# Patient Record
Sex: Male | Born: 1950
Health system: Southern US, Community
[De-identification: ages and names within clinical notes are randomized; demographics above are authoritative.]

## PROBLEM LIST (undated history)

## (undated) DIAGNOSIS — I10 Essential (primary) hypertension: Secondary | ICD-10-CM

## (undated) DIAGNOSIS — M179 Osteoarthritis of knee, unspecified: Secondary | ICD-10-CM

## (undated) DIAGNOSIS — F419 Anxiety disorder, unspecified: Secondary | ICD-10-CM

## (undated) DIAGNOSIS — M1990 Unspecified osteoarthritis, unspecified site: Secondary | ICD-10-CM

## (undated) DIAGNOSIS — M1710 Unilateral primary osteoarthritis, unspecified knee: Secondary | ICD-10-CM

## (undated) DIAGNOSIS — R001 Bradycardia, unspecified: Secondary | ICD-10-CM

## (undated) DIAGNOSIS — E119 Type 2 diabetes mellitus without complications: Secondary | ICD-10-CM

## (undated) HISTORY — PX: HERNIA REPAIR: SHX51

## (undated) HISTORY — DX: Essential (primary) hypertension: I10

## (undated) HISTORY — DX: Type 2 diabetes mellitus without complications: E11.9

## (undated) HISTORY — DX: Unspecified osteoarthritis, unspecified site: M19.90

## (undated) HISTORY — PX: SPINE SURGERY: SHX786

---

## 2006-03-15 ENCOUNTER — Ambulatory Visit (HOSPITAL_COMMUNITY): Admission: RE | Admit: 2006-03-15 | Discharge: 2006-03-15 | Payer: Self-pay | Admitting: Neurosurgery

## 2006-04-03 ENCOUNTER — Inpatient Hospital Stay (HOSPITAL_COMMUNITY): Admission: RE | Admit: 2006-04-03 | Discharge: 2006-04-06 | Payer: Self-pay | Admitting: Neurosurgery

## 2006-11-05 ENCOUNTER — Ambulatory Visit (HOSPITAL_COMMUNITY): Admission: RE | Admit: 2006-11-05 | Discharge: 2006-11-05 | Payer: Self-pay | Admitting: Internal Medicine

## 2006-11-06 ENCOUNTER — Emergency Department (HOSPITAL_COMMUNITY): Admission: EM | Admit: 2006-11-06 | Discharge: 2006-11-06 | Payer: Self-pay | Admitting: Emergency Medicine

## 2006-11-11 ENCOUNTER — Ambulatory Visit: Payer: Self-pay | Admitting: Internal Medicine

## 2006-11-13 ENCOUNTER — Ambulatory Visit: Payer: Self-pay | Admitting: Internal Medicine

## 2006-11-27 ENCOUNTER — Ambulatory Visit (HOSPITAL_COMMUNITY): Admission: RE | Admit: 2006-11-27 | Discharge: 2006-11-27 | Payer: Self-pay | Admitting: Internal Medicine

## 2007-03-27 ENCOUNTER — Ambulatory Visit (HOSPITAL_BASED_OUTPATIENT_CLINIC_OR_DEPARTMENT_OTHER): Admission: RE | Admit: 2007-03-27 | Discharge: 2007-03-27 | Payer: Self-pay | Admitting: Plastic Surgery

## 2008-05-23 ENCOUNTER — Ambulatory Visit: Payer: Self-pay | Admitting: Internal Medicine

## 2008-06-27 ENCOUNTER — Ambulatory Visit: Payer: Self-pay | Admitting: Internal Medicine

## 2008-07-06 ENCOUNTER — Ambulatory Visit: Payer: Self-pay | Admitting: Internal Medicine

## 2008-11-14 ENCOUNTER — Ambulatory Visit: Payer: Self-pay | Admitting: Internal Medicine

## 2010-06-26 ENCOUNTER — Ambulatory Visit: Payer: Self-pay | Admitting: Internal Medicine

## 2010-07-03 ENCOUNTER — Ambulatory Visit: Payer: Self-pay | Admitting: Internal Medicine

## 2010-09-04 ENCOUNTER — Ambulatory Visit (INDEPENDENT_AMBULATORY_CARE_PROVIDER_SITE_OTHER): Payer: Commercial Managed Care - PPO | Admitting: Internal Medicine

## 2010-09-04 DIAGNOSIS — M1990 Unspecified osteoarthritis, unspecified site: Secondary | ICD-10-CM

## 2010-09-04 DIAGNOSIS — M2681 Anterior soft tissue impingement: Secondary | ICD-10-CM

## 2010-11-20 NOTE — Op Note (Signed)
NAME:  [name], [name] NO.:  [id number]   MEDICAL RECORD NO.:  [id number]          PATIENT TYPE:  AMB   LOCATION:  DSC                          FACILITY:  [facility]   PHYSICIAN:  Etter Sjogren, M.D.     DATE OF BIRTH:  12-02-1950   DATE OF PROCEDURE:  03/27/2007  DATE OF DISCHARGE:                               OPERATIVE REPORT   PREOPERATIVE DIAGNOSIS:  Keloid/hypertrophic scar, left neck, with  limitation of range of motion.   POSTOPERATIVE DIAGNOSIS:  Keloid/hypertrophic scar, left neck, with  limitation of range of motion.   PROCEDURE PERFORMED:  Multiple Z-plasties of scar, left neck.   SURGEON:  Etter Sjogren, M.D.   ANESTHESIA:  MAC with 0.25% Marcaine with epinephrine.   CLINICAL NOTE:  A 60 year old man who has had a procedure and has  experienced a lot of hypertrophic scarring in the surgical approach that  was a vertical excision on his anterior neck.  The scar has become worse  over time.  It is comfortable and is limiting range of motion.  He  desires treatment, and options were discussed.  He elected to go ahead  with the Z-plasty, with the understanding that he may have recurrence of  this scar tissue.  Also risks include but not limited to anesthesia,  scarring, wound healing problems, bleeding and a loss of tissue.  He  wished to proceed.   DESCRIPTION OF PROCEDURE:  Patient was taken to the operating room and  placed supine under satisfactory sedation, prepped with Betadine and  draped with sterile drapes.  The excision of this scar was designed as  well as the Z-plasty flaps, one at the superior aspect of the scar, one  at the inferior aspect.  Satisfactory local anesthesia with 0.25%  Marcaine with epinephrine achieved.  Scar excised.  Flaps elevated.  Excellent color.  Bright red bleeding from periphery consistent with  viability.  Care taken during the process of elevation to avoid any  damage to underlying platysmal muscle.  Irrigation with  saline.  Meticulous hemostasis with electrocautery.  Excellent hemostasis  confirmed.  Flaps transferred, inset, 3-0 and 4-0 Monocryl interrupted  inverted deep sutures and a running 3-0 Prolene subcuticular suture.  Steri-Strips, light dressing applied.  Tolerated well.   DISPOSITION:  Recheck office in a little bit over a week ago.      Etter Sjogren, M.D.  Electronically Signed     DB/MEDQ  D:  03/27/2007  T:  03/27/2007  Job:  [id number]   cc:   Etter Sjogren, M.D.

## 2010-11-20 NOTE — Assessment & Plan Note (Signed)
[facility]                         GASTROENTEROLOGY OFFICE NOTE   NAME:[name], [name] MOEN                    MRN:          [id number]  DATE:11/11/2006                            DOB:          07-21-1950    REFERRING PHYSICIAN:  Luanna Cole. Baxley, M.D.   CHIEF COMPLAINT:  Swallowing problems, esophageal dysmotility.   ASSESSMENT:  A 60 year old African American male who has had  intermittent solid food dysphagia since he had anterior cervical spine  surgery in September 2007.  It started about three weeks afterwards.  An  upper GI shows sluggish primary peristalsis in what they described as a  small hiatal hernia.  I have reviewed the film and I think there may  also be a lower esophageal ring.  A 13 mm tablet did pass.  However, he  certainly could be having dysphagia to foods larger than that.  Whether  this is related to his cervical spine surgery or not, I cannot say.  In  my experience, that usually is associated with problems in the more  proximal esophagus, though it is potentially possible that the cervical  spondylitic myelopathy he had which necessitated his surgery could have  had something to do with his dysphagia and that there was a delayed  presentation of that, I suppose.   PLAN:  1. Start with an esophagogastroduodenoscopy and likely an esophageal      dilation.  This may provide benefit whether or not he has a ring or      not.  2. I think continuing the Nexium is reasonable.  He has samples of      that, as reflux could be inducing some of these problems.  3. He might need an esophageal manometry.  4. Further workup pending response to dilation or lack thereof and      clinical course.  5. At some point, a screening colonoscopy would be reasonable, and he      has some abnormal transaminases of unclear etiology.  Dr. Lenord Fellers      has indicated she plans to work these up further.  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  We will defer to Dr. Lenord Fellers on that for now.   HISTORY OF PRESENT ILLNESS:  As above, this 60 year old African American  male had a lot of problems with cervical spondylitic myelopathy with  cervical disk herniation, and Dr. Venetia Maxon performed anterior cervical  corpectomy at C4-5 with anterior cervical peak cage at the anterior  cervical plating C3-6 levels.  About three weeks after that, he noted  difficulty swallowing that has gotten worse.  He also describes a  feeling like he has to cough and clear his throat at times, but cannot  bring anything up.  He does not really describe heartburn.  He does  describe solid food dysphagia intermittent with a super sternal sticking  point, and sometimes he  regurgitates.  Usually, he drinks water and it  goes down.  GI review of systems is otherwise negative.  There is no  weight loss.  No bleeding.   PAST MEDICAL HISTORY:  1. Hypertension.  2. Anterior cervical surgery as above.   FAMILY HISTORY:  I reviewed, is negative.  No colon cancer.   SOCIAL HISTORY:  He is married.  Two sons, one daughter.  He has 16  years of education.  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  He does not use tobacco, alcohol or drugs.   REVIEW OF SYSTEMS:  Positive for some insomnia difficulty, recent  problems with fever.  All other systems are negative.   PHYSICAL EXAMINATION:  GENERAL:  Obese black male, height 5 feet 10  inches, weight 298 pounds, blood pressure 142/98, pulse 102.  HEENT:  Eyes are anicteric.  Normal mouth.  Posterior pharynx.  NECK:  Supple.  There is a keloid scar on the left side of the neck  without mass or other abnormality.  LUNGS:  Clear.  HEART:  S1, S2.  No murmurs, rubs or gallops.  ABDOMEN:  Obese, soft, nontender without organomegaly or mass.  LYMPHATIC:  No neck or supraclavicular nodes  palpated.  EXTREMITIES:  Lower extremities free of edema.  SKIN:  Warm and dry.  No acute rash.  PSYCHIATRIC:  He is alert and oriented x3.   LABORATORY DATA:  Lab data from an emergency room visit where he had  some chest pain symptoms showed a normal CBC.  AST 60, ALT 94, glucose  139, cardiac enzymes negative.  He tells me an EKG was unrevealing.  I  reviewed the records from Dr. Lenord Fellers as well.   I appreciate the opportunity to care for this patient.     Iva Boop, MD,FACG  Electronically Signed    CEG/MedQ  DD: 11/11/2006  DT: 11/12/2006  Job #: [id number]   cc:   Luanna Cole. Lenord Fellers, M.D.

## 2010-11-23 NOTE — Op Note (Signed)
NAME:  [name], [name] NO.:  [id number]   MEDICAL RECORD NO.:  [id number]          PATIENT TYPE:  INP   LOCATION:  3172                         FACILITY:  [facility]   PHYSICIAN:  Danae Orleans. Venetia Maxon, M.D.  DATE OF BIRTH:  1950-08-22   DATE OF PROCEDURE:  04/03/2006  DATE OF DISCHARGE:                                 OPERATIVE REPORT   PREOPERATIVE DIAGNOSES:  Cervical stenosis with myelopathy, spondylosis with  myelopathy, ossification of posterior longitudinal ligament, and cervical  radiculopathy.   POSTOPERATIVE DIAGNOSES:  Cervical stenosis with myelopathy, spondylosis  with myelopathy, ossification of posterior longitudinal ligament, and  cervical radiculopathy.   PROCEDURES:  Anterior cervical corpectomy of C4 and C5, with anterior  cervical PEEK interbody corpectomy cage, 48 mm in length, packed with  morcellized bone autograft and Osteocel, with anterior cervical plating from  C3 through C6 levels.   SURGEON:  Danae Orleans. Venetia Maxon, M.D.   ASSISTANTS:  Stefani Dama, M.D.; Georgiann Cocker, RN.   ANESTHESIA:  General endotracheal anesthesia.   ESTIMATED BLOOD LOSS:  1500 cc with 300 cc Cell Saver blood returned to the  patient.   COMPLICATIONS:  None.   DISPOSITION:  Recovery.   INDICATIONS:  [name] [name] is a 60 year old man with a profound cervical  myelopathy and profound cervical stenosis, with ossification of the  posterior longitudinal ligament and severe spinal cord compression from C3-4  to the C5-6 level.  It was elected to take him to surgery for anterior  cervical corpectomy of C4 and C5 and strut grafting with decompression of  the cervical spinal cord.   DESCRIPTION OF PROCEDURES:  Mr. Vespa was brought to the operating room.  Following the satisfactory and uncomplicated induction of general  endotracheal anesthesia and placement of intravenous lines, the patient was  placed in the supine position on the operating table.  He had a Foley  catheter placed.  He was placed in 10 pounds of halter traction.  His  anterior neck was then prepped and draped in the usual sterile fashion.  A  longitudinal incision was made along the anterior border of the  sternocleidomastoid after infiltrating the skin and subcutaneous tissues,  and this was carried through the platysmal layer. Subplatysmal dissection  was performed and blunt dissection was performed, keeping the carotid sheath  lateral and trachea and esophagus medial, exposing the anterior cervical  spine.  Bent spinal needles were placed at what were felt to be the C3-4 and  C4-5 levels, and this was confirmed on intraoperative x-ray, but the only  visible needle was at the C3-4 level.  The longus colli muscles were then  taken down from the anterior cervical spine from C6 to C3 bilaterally using  electrocautery and key elevator, and ventral osteophytes were removed with a  Leksell rongeur.  The Shadowline retractor was placed.  Because of the  patient's large body habitus, 55-mm blades were placed from side to side,  and 70-mm blades were placed from top to bottom.  The interspaces at C3-4,  C4-5 and C5-6 were then incised with a 15 blade and disk material was  removed in a piecemeal fashion.  Partial corpectomies of C4 and C5 were  performed with a Leksell rongeur, and then completed with a high-speed drill  with ball, matchstick and barrel burs.  The bone was highly vascular and  hemostasis was obtained with Gelfoam soaked in thrombin.  The lateral  epidural veins were also highly vascular, and there was significant spinal  cord compression with calcified posterior longitudinal ligament and  osteophytes that had formed, which were causing significant cord  compression.  These were painstakingly removed with a variety of 1-, 2- and  3-mm ball-tip Kerrison rongeurs, as well as the high-speed drill and curets.  Eventually, a plane was identified between the ligament and dura, and   eventually the dura was decompressed both centrally and also bilaterally,  with undercutting C3 and undercutting C6.  This took a very considerable  amount of time.  It was done under microscopic visualization and hemostasis  was again assured using Gelfoam soaked in thrombin.  Cell Saver was utilized  because of the vascular nature of this decompression.  Eventually, the  spinal cord dura was felt to be well decompressed and hemostasis was assured  with Gelfoam soaked in thrombin.  Then, after trial sizing, a 48-mm PEEK  interbody corpectomy cage was selected, packed with morcellized bone  autograft and Osteocel, and then this was tamped into position using gentle  traction on the patient's neck, and after it was tamped into position, a 57-  mm Alphatec Reveal  anterior cervical plate was then affixed to the anterior  cervical spine using a combination of fixed and variable screws at C3 and  C6.  All screws had excellent purchase.  The locking mechanisms were  engaged.  Final x-ray demonstrated a well-positioned interbody cage and  anterior cervical plate at the top-most part of the exposure.  Prior to  placing the plate, the traction weight was removed.  Hemostasis was again  assured in the soft tissues, and a 7-mm JP drain was placed through a  separate stab incision.  The wound was closed with interrupted 3-0 Vicryl  sutures to reapproximate the platysma layer, and an interrupted 3-0 Vicryl  subcuticular stitch.  A Gelpi was used to align the skin edges prior to  closure.  The wound was then dressed with benzoin and Steri-Strips, Telfa,  gauze and tape.  The patient was extubated in the operating room and taken  to the recovery room in stable, satisfactory condition, having tolerated  this operation well.  Counts were correct at the end of the case.      Danae Orleans. Venetia Maxon, M.D.  Electronically Signed    JDS/MEDQ  D:  04/03/2006  T:  04/03/2006  Job:  [id number]

## 2010-11-23 NOTE — Discharge Summary (Signed)
NAME:  [name] [name], [name] [name] NO.:  [id number]   MEDICAL RECORD NO.:  [id number]            PATIENT TYPE:   LOCATION:                                 FACILITY:   PHYSICIAN:  Danae Orleans. Venetia Maxon, M.D.       DATE OF BIRTH:   DATE OF ADMISSION:  04/03/2006  DATE OF DISCHARGE:  04/06/2006                               DISCHARGE SUMMARY   REASON FOR ADMISSION:  He had cervical spondylitic myelopathy with  cervical disk herniation, ossification of posterior longitudinal  ligament, severe myelopathy with muscle weakness, and hyperreflexia.   FINAL DIAGNOSES:  1. Cervical spondylitic myelopathy with cervical disk herniation.  2. Ossification of posterior longitudinal ligament.  3. Severe myelopathy with muscle weakness.  4. Hyperreflexia.   HISTORY OF PRESENT ILLNESS/HOSPITAL COURSE:  [name] [name] is a 60-  year-old man with severe cervical spondylosis and canal stenosis with  ossification of posterior longitudinal ligament and significant cervical  myelopathy. He was admitted to the hospital same day as procedure basis  and underwent anterior cervical corpectomy C4-C5 with anterior cervical  peak cage at the anterior cervical plating C3-C6 levels. He did well  with the surgery, but did have some postoperative right deltoid weakness  which gradually improved. He was up and ambulating and doing well after  surgery and, otherwise, had significant improvement in his myelopathic  symptoms. He was discharged home in stable and satisfactory condition  having tolerated his operation and hospitalization well.   DISCHARGE MEDICATIONS:  Percocet.   FOLLOWUP:  Follow up in my office in three weeks.      Danae Orleans. Venetia Maxon, M.D.  Electronically Signed     JDS/MEDQ  D:  06/26/2006  T:  06/27/2006  Job:  [id number]

## 2011-01-30 ENCOUNTER — Other Ambulatory Visit: Payer: Self-pay | Admitting: Internal Medicine

## 2011-04-09 ENCOUNTER — Telehealth: Payer: Self-pay | Admitting: Internal Medicine

## 2011-04-09 MED ORDER — TESTOSTERONE 20.25 MG/ACT (1.62%) TD GEL: 1.0000 "application " | TRANSDERMAL | Status: DC | PRN

## 2011-04-09 NOTE — Telephone Encounter (Signed)
Please make this a new RX with prn one year refills.

## 2011-04-09 NOTE — Telephone Encounter (Signed)
Phone note canceled.  Pt will have pharmacy call for refill

## 2011-04-18 LAB — BASIC METABOLIC PANEL
BUN: 13
GFR calc non Af Amer: >60
Glucose, Bld: 103 — ABNORMAL HIGH
Potassium: 4

## 2011-08-06 ENCOUNTER — Other Ambulatory Visit: Payer: Self-pay | Admitting: Internal Medicine

## 2011-12-31 ENCOUNTER — Other Ambulatory Visit: Payer: Self-pay | Admitting: Internal Medicine

## 2011-12-31 ENCOUNTER — Other Ambulatory Visit: Payer: Commercial Managed Care - PPO | Admitting: Internal Medicine

## 2011-12-31 DIAGNOSIS — Z0000 Encounter for general adult medical examination without abnormal findings: Secondary | ICD-10-CM

## 2011-12-31 DIAGNOSIS — I10 Essential (primary) hypertension: Secondary | ICD-10-CM

## 2011-12-31 LAB — CBC WITH DIFFERENTIAL/PLATELET
Eosinophils Absolute: 0.1 10*3/uL (ref 0.0–0.7)
Eosinophils Relative: 2 % (ref 0–5)
Lymphs Abs: 2.1 10*3/uL (ref 0.7–4.0)
MCH: 29.6 pg (ref 26.0–34.0)
MCV: 90 fL (ref 78.0–100.0)
Monocytes Absolute: 0.4 10*3/uL (ref 0.1–1.0)
Monocytes Relative: 9 % (ref 3–12)
Platelets: 278 10*3/uL (ref 150–400)
RBC: 4.49 MIL/uL (ref 4.22–5.81)

## 2011-12-31 LAB — COMPREHENSIVE METABOLIC PANEL
BUN: 11 mg/dL (ref 6–23)
CO2: 25 mEq/L (ref 19–32)
Creat: 0.92 mg/dL (ref 0.50–1.35)
Glucose, Bld: 107 mg/dL — ABNORMAL HIGH (ref 70–99)
Total Bilirubin: 0.9 mg/dL (ref 0.3–1.2)

## 2011-12-31 LAB — LIPID PANEL
Cholesterol: 181 mg/dL (ref 0–200)
HDL: 51 mg/dL (ref >39)
Total CHOL/HDL Ratio: 3.5 Ratio
Triglycerides: 80 mg/dL (ref <150)
VLDL: 16 mg/dL (ref 0–40)

## 2012-01-02 ENCOUNTER — Ambulatory Visit (INDEPENDENT_AMBULATORY_CARE_PROVIDER_SITE_OTHER): Payer: Commercial Managed Care - PPO | Admitting: Internal Medicine

## 2012-01-02 VITALS — BP 150/82 | HR 76 | Temp 97.9°F | Ht 70.0 in | Wt 280.0 lb

## 2012-01-02 DIAGNOSIS — I10 Essential (primary) hypertension: Secondary | ICD-10-CM

## 2012-01-02 DIAGNOSIS — M170 Bilateral primary osteoarthritis of knee: Secondary | ICD-10-CM

## 2012-01-02 DIAGNOSIS — E119 Type 2 diabetes mellitus without complications: Secondary | ICD-10-CM

## 2012-01-02 DIAGNOSIS — Z8719 Personal history of other diseases of the digestive system: Secondary | ICD-10-CM

## 2012-01-02 DIAGNOSIS — M1710 Unilateral primary osteoarthritis, unspecified knee: Secondary | ICD-10-CM

## 2012-01-02 DIAGNOSIS — IMO0002 Reserved for concepts with insufficient information to code with codable children: Secondary | ICD-10-CM

## 2012-01-02 DIAGNOSIS — E669 Obesity, unspecified: Secondary | ICD-10-CM

## 2012-01-02 DIAGNOSIS — M5090 Cervical disc disorder, unspecified, unspecified cervical region: Secondary | ICD-10-CM

## 2012-01-02 LAB — POCT URINALYSIS DIPSTICK
Bilirubin, UA: NEGATIVE
Ketones, UA: NEGATIVE
Protein, UA: NEGATIVE
Spec Grav, UA: 1.01
pH, UA: 6

## 2012-01-03 MED ORDER — TESTOSTERONE 20.25 MG/ACT (1.62%) TD GEL: 2.0000 "application " | Freq: Every morning | TRANSDERMAL | Status: DC

## 2012-01-16 ENCOUNTER — Other Ambulatory Visit: Payer: Self-pay | Admitting: Internal Medicine

## 2012-03-02 ENCOUNTER — Encounter: Payer: Self-pay | Admitting: Internal Medicine

## 2012-03-02 ENCOUNTER — Ambulatory Visit (INDEPENDENT_AMBULATORY_CARE_PROVIDER_SITE_OTHER): Payer: Commercial Managed Care - PPO | Admitting: Internal Medicine

## 2012-03-02 VITALS — BP 140/88 | HR 80 | Temp 99.0°F | Resp 16 | Wt 280.0 lb

## 2012-03-02 DIAGNOSIS — N529 Male erectile dysfunction, unspecified: Secondary | ICD-10-CM | POA: Insufficient documentation

## 2012-03-02 DIAGNOSIS — I10 Essential (primary) hypertension: Secondary | ICD-10-CM | POA: Insufficient documentation

## 2012-03-02 DIAGNOSIS — R109 Unspecified abdominal pain: Secondary | ICD-10-CM

## 2012-03-02 DIAGNOSIS — E869 Volume depletion, unspecified: Secondary | ICD-10-CM

## 2012-03-02 DIAGNOSIS — K529 Noninfective gastroenteritis and colitis, unspecified: Secondary | ICD-10-CM

## 2012-03-02 DIAGNOSIS — R7302 Impaired glucose tolerance (oral): Secondary | ICD-10-CM

## 2012-03-02 DIAGNOSIS — K5289 Other specified noninfective gastroenteritis and colitis: Secondary | ICD-10-CM

## 2012-03-02 DIAGNOSIS — R1110 Vomiting, unspecified: Secondary | ICD-10-CM

## 2012-03-02 DIAGNOSIS — R197 Diarrhea, unspecified: Secondary | ICD-10-CM

## 2012-03-02 DIAGNOSIS — R7309 Other abnormal glucose: Secondary | ICD-10-CM

## 2012-03-02 LAB — POCT URINALYSIS DIPSTICK
Glucose, UA: NEGATIVE
Leukocytes, UA: NEGATIVE
Spec Grav, UA: 1.015
Urobilinogen, UA: 0.2

## 2012-03-02 LAB — CBC WITH DIFFERENTIAL/PLATELET
Lymphocytes Relative: 28 % (ref 12–46)
Lymphs Abs: 1.5 10*3/uL (ref 0.7–4.0)
MCV: 92.8 fL (ref 78.0–100.0)
Neutro Abs: 3.4 10*3/uL (ref 1.7–7.7)
Platelets: 286 10*3/uL (ref 150–400)
RBC: 5.32 MIL/uL (ref 4.22–5.81)
RDW: 13.1 % (ref 11.5–15.5)
WBC: 5.4 10*3/uL (ref 4.0–10.5)

## 2012-03-02 LAB — COMPREHENSIVE METABOLIC PANEL
ALT: 21 U/L (ref 0–53)
BUN: 10 mg/dL (ref 6–23)
CO2: 21 mEq/L (ref 19–32)
Calcium: 9.6 mg/dL (ref 8.4–10.5)
Chloride: 106 mEq/L (ref 96–112)
Creat: 1 mg/dL (ref 0.50–1.35)
Glucose, Bld: 126 mg/dL — ABNORMAL HIGH (ref 70–99)
Total Bilirubin: 1.6 mg/dL — ABNORMAL HIGH (ref 0.3–1.2)

## 2012-03-02 NOTE — Patient Instructions (Addendum)
You have received 2 L of IV fluids today in the office. He will need to take Phenergan tablets 25 mg every 4-6 hours as needed for nausea. Stay out of work for several days. Keep herself well hydrated with Gatorade, soft drinks, Soup, Tea, ginger ale. Stay away from milk water and orange juice. He may have salty crackers or toast. Call us tomorrow with progress report.

## 2012-03-03 ENCOUNTER — Telehealth: Payer: Self-pay | Admitting: Internal Medicine

## 2012-03-06 MED ORDER — SODIUM CHLORIDE 0.9 % IV SOLN: INTRAVENOUS | Status: DC

## 2012-03-07 ENCOUNTER — Encounter: Payer: Self-pay | Admitting: Internal Medicine

## 2012-03-07 NOTE — Progress Notes (Signed)
  Subjective:    Patient ID: [name] [name], male    DOB: 04/14/1951, 61 y.o.   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 Says coworkers have been out sick with gastroenteritis symptoms last week. No recent travel history. Has had several episodes of diarrhea but multiple episodes of vomiting. Remains nauseated. Has been trying to drink water but it will not stay down. Not much fluid intake in past 24 hours. No cough or congestion. No shaking chills. History of impaired glucose tolerance and hypertension. Has city water. No blood in stool.  He is concerned because his son who resides with him recently had a kidney transplant. Wife accompanies him today but has not become ill.    Review of Systems     Objective:   Physical Exam Patient is lethargic. He is weak. Urinalysis is abnormal. See report. Skin is warm and dry. Pharynx is clear. TMs are clear. Neck is supple. Chest is clear. Cardiac exam regular rate and rhythm. Abdomen no hepatosplenomegaly or masses. Mild generalized tenderness without rebound tenderness. Rectal exam not done. He is alert and oriented.        Assessment & Plan:  Volume depletion-patient given 2 L of normal saline in the office today over several hours. Was given Zofran IM in the office. CBC with differential and C. met drawn.  Gastroenteritis-likely Norovirus  Impaired glucose tolerance  Hypertension  Plan: Volume repletion with 2 L normal saline in the office. Zofran IM given in the office. Take Phenergan 25 mg tablets 1 by mouth every 4 hours as needed for nausea. Stay with clear liquids and told vomiting and diarrhea have resolved then advance diet slowly. Stay out of work for several days. Note provided to employer. Patient advised to call tomorrow with progress report. Addendum: 03/03/2012 patient did not call us for progress reports a we called  patient. Wife reports he is resting at home and is somewhat improved.  Patient was in the office from 2 PM until 5 PM being checked repeatedly multiple times by physician to make sure IV was running and that his condition was stable.

## 2012-03-09 ENCOUNTER — Encounter: Payer: Self-pay | Admitting: Internal Medicine

## 2012-03-09 DIAGNOSIS — Z8719 Personal history of other diseases of the digestive system: Secondary | ICD-10-CM | POA: Insufficient documentation

## 2012-03-09 DIAGNOSIS — E118 Type 2 diabetes mellitus with unspecified complications: Secondary | ICD-10-CM | POA: Insufficient documentation

## 2012-03-09 DIAGNOSIS — M170 Bilateral primary osteoarthritis of knee: Secondary | ICD-10-CM | POA: Insufficient documentation

## 2012-03-09 DIAGNOSIS — M5090 Cervical disc disorder, unspecified, unspecified cervical region: Secondary | ICD-10-CM | POA: Insufficient documentation

## 2012-03-09 DIAGNOSIS — E669 Obesity, unspecified: Secondary | ICD-10-CM | POA: Insufficient documentation

## 2012-03-09 NOTE — Patient Instructions (Addendum)
Continue to watch  diet. Try to lose weight. Continue same medications. Return in 6 months.

## 2012-03-09 NOTE — Progress Notes (Signed)
  Subjective:    Patient ID: [name] [name], male    DOB: 10-30-1950, 61 y.o.   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 History of hypertension and type 2 diabetes mellitus. History of Schatzki's reading. History of GE reflux. Cholesterol is normal with the exception of an LDL cholesterol of 114. CBC is normal. BUN and creatinine are normal. Fasting glucose is 107. PSA normal. Controlled diabetes with diet alone. In 2011 hemoglobin A1c was 6.4%. Patient had esophageal stricture dilated by Dr. Leone Payor in 2008. Sugar was in distal esophagus. No further complications. History of degenerative joint disease in both knees injected by Dr. Yisroel Ramming for in October 2011. Possible history of gout although uric acid was checked 2011 was 6.9. We tried him on allopurinol at one time but he no longer takes that. History of small hiatal hernia based on endoscopy. He also had colonoscopy done December 2009 showing hemorrhoids. Otherwise no polyps detected. Noted the candy store at time of colonoscopy. Sleep apnea was suspected. Never had a sleep study.  He had anterior cervical spine surgery in September 2007 and had dysphasia starting 3 weeks afterward. He had an upper GI showing some impaired peristalsis. Dr. Leone Payor suspected an esophageal ring. He had an anterior cervical corpectomy at C4-C5 and cervical plating at C3-C6.  Nonsmoker. Does not consume alcohol. He is married- has 2 sons and one daughter. Son recently had a kidney transplant with history of uncontrolled hypertension and obesity. [place] history of erectile dysfunction. . Had diabetic eye exam in 2010 at vision works in [employer].  Immunizations done through [facility] at [facility]  Family history: Mother died at age 34 with a CVA and hypertension. Father living.    Review of Systems     Objective:   Physical  Exam        Assessment & Plan:  Hypertension  Type 2 diabetes mellitus-diet controlled  Obesity  History of esophageal ring dilated 2007  History of anterior cervical discectomy 2007  Erectile dysfunction  Degenerative joint disease both knees  Plan: Return in 6 months or as needed. Followup on hemoglobin A1c, fasting lipid panel at that time and make further recommendations. He probably needs to be on a statin medication if I can convince him to take it.  Possible history of gout  Addendum hemoglobin A1c is 6.2% which is excellent. Consider sleep study.

## 2012-04-06 ENCOUNTER — Other Ambulatory Visit: Payer: Self-pay

## 2012-04-06 MED ORDER — SILDENAFIL CITRATE 100 MG PO TABS: 100.0000 mg | ORAL_TABLET | Freq: Every day | ORAL | Status: DC | PRN

## 2012-06-29 ENCOUNTER — Ambulatory Visit: Payer: Commercial Managed Care - PPO | Admitting: Internal Medicine

## 2012-07-06 ENCOUNTER — Encounter: Payer: Self-pay | Admitting: Internal Medicine

## 2012-07-06 ENCOUNTER — Ambulatory Visit (INDEPENDENT_AMBULATORY_CARE_PROVIDER_SITE_OTHER): Payer: Commercial Managed Care - PPO | Admitting: Internal Medicine

## 2012-07-06 VITALS — BP 130/80 | HR 84 | Temp 98.1°F | Wt 272.0 lb

## 2012-07-06 DIAGNOSIS — E119 Type 2 diabetes mellitus without complications: Secondary | ICD-10-CM

## 2012-07-06 DIAGNOSIS — K219 Gastro-esophageal reflux disease without esophagitis: Secondary | ICD-10-CM | POA: Insufficient documentation

## 2012-07-06 LAB — HEMOGLOBIN A1C
Hgb A1c MFr Bld: 6.2 % — ABNORMAL HIGH (ref <5.7)
Mean Plasma Glucose: 131 mg/dL — ABNORMAL HIGH (ref <117)

## 2012-07-06 MED ORDER — AMLODIPINE BESYLATE 10 MG PO TABS: 10.0000 mg | ORAL_TABLET | Freq: Every day | ORAL | Status: DC

## 2012-07-06 MED ORDER — NEXIUM 40 MG PO CPDR: 40.0000 mg | DELAYED_RELEASE_CAPSULE | Freq: Every day | ORAL | Status: DC

## 2012-07-06 MED ORDER — SILDENAFIL CITRATE 100 MG PO TABS: 100.0000 mg | ORAL_TABLET | Freq: Every day | ORAL | Status: DC | PRN

## 2012-07-06 MED ORDER — FUROSEMIDE 20 MG PO TABS: 20.0000 mg | ORAL_TABLET | Freq: Every day | ORAL | Status: DC

## 2012-07-06 MED ORDER — LISINOPRIL 10 MG PO TABS: 10.0000 mg | ORAL_TABLET | Freq: Every day | ORAL | Status: DC

## 2012-07-06 NOTE — Patient Instructions (Addendum)
Continue same medications and return for physical exam in 6 months 

## 2012-07-06 NOTE — Progress Notes (Signed)
  Subjective:    Patient ID: [name] [name], male    DOB: 10-21-1950, 61 y.o.   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 Control diabetes with diet alone. Blood pressure is slightly elevated today but he just came from work. He's been watching his blood pressure at work on a daily basis and says is never over 140 systolically. Mostly runs around 130 systolically. Uses Viagra for erectile dysfunction. This was refilled today for one year. Has history of GE reflux and has been taking Nexium for [facility] pharmacy but pharmacy is now switching from Nexium to Protonix. Blood pressure was initially 150/90 when he arrived but rechecked and was 130/80.    Review of Systems     Objective:   Physical Exam neck is supple without JVD thyromegaly or carotid bruits. Chest clear to auscultation. Cardiac exam regular rate and rhythm. Extremities without edema. Diabetic foot exam: No ulcers or calluses        Assessment & Plan:  Hypertension-fairly well controlled on current regimen  Type 2 diabetes mellitus-hemoglobin A1c drawn and pending  Erectile dysfunction-refill Viagra for one year  GE reflux-stable on Nexium but pharmacy is changing from Nexium to generic Protonix  Plan: Return in 6 months for physical exam. We'll need to switch Nexium to generic Protonix for next prescription at [facility]. Had influenza immunization through employment.

## 2012-07-16 ENCOUNTER — Telehealth: Payer: Self-pay | Admitting: Internal Medicine

## 2012-07-16 NOTE — Telephone Encounter (Signed)
It has been taken care of today with prn one year refills.

## 2012-07-20 ENCOUNTER — Other Ambulatory Visit: Payer: Self-pay | Admitting: Internal Medicine

## 2012-07-20 NOTE — Telephone Encounter (Signed)
Please refill for one year  

## 2012-07-20 NOTE — Telephone Encounter (Signed)
Refill for a year 

## 2013-01-04 ENCOUNTER — Other Ambulatory Visit: Payer: Commercial Managed Care - PPO | Admitting: Internal Medicine

## 2013-01-04 DIAGNOSIS — Z125 Encounter for screening for malignant neoplasm of prostate: Secondary | ICD-10-CM

## 2013-01-04 DIAGNOSIS — I10 Essential (primary) hypertension: Secondary | ICD-10-CM

## 2013-01-04 DIAGNOSIS — Z13220 Encounter for screening for lipoid disorders: Secondary | ICD-10-CM

## 2013-01-04 DIAGNOSIS — E119 Type 2 diabetes mellitus without complications: Secondary | ICD-10-CM

## 2013-01-04 LAB — CBC WITH DIFFERENTIAL/PLATELET
Eosinophils Absolute: 0.1 10*3/uL (ref 0.0–0.7)
Hemoglobin: 13.6 g/dL (ref 13.0–17.0)
Lymphocytes Relative: 45 % (ref 12–46)
Lymphs Abs: 2.1 10*3/uL (ref 0.7–4.0)
MCH: 30.4 pg (ref 26.0–34.0)
Monocytes Relative: 12 % (ref 3–12)
Neutro Abs: 1.9 10*3/uL (ref 1.7–7.7)
Neutrophils Relative %: 41 % — ABNORMAL LOW (ref 43–77)
Platelets: 284 10*3/uL (ref 150–400)
RBC: 4.48 MIL/uL (ref 4.22–5.81)
WBC: 4.7 10*3/uL (ref 4.0–10.5)

## 2013-01-04 LAB — COMPREHENSIVE METABOLIC PANEL
ALT: 21 U/L (ref 0–53)
Albumin: 4 g/dL (ref 3.5–5.2)
CO2: 25 mEq/L (ref 19–32)
Glucose, Bld: 97 mg/dL (ref 70–99)
Potassium: 3.9 mEq/L (ref 3.5–5.3)
Sodium: 141 mEq/L (ref 135–145)
Total Bilirubin: 1 mg/dL (ref 0.3–1.2)
Total Protein: 7.1 g/dL (ref 6.0–8.3)

## 2013-01-04 LAB — LIPID PANEL
Cholesterol: 180 mg/dL (ref 0–200)
LDL Cholesterol: 114 mg/dL — ABNORMAL HIGH (ref 0–99)
VLDL: 15 mg/dL (ref 0–40)

## 2013-01-04 LAB — HEMOGLOBIN A1C
Hgb A1c MFr Bld: 6.1 % — ABNORMAL HIGH (ref <5.7)
Mean Plasma Glucose: 128 mg/dL — ABNORMAL HIGH (ref <117)

## 2013-01-05 ENCOUNTER — Ambulatory Visit (INDEPENDENT_AMBULATORY_CARE_PROVIDER_SITE_OTHER): Payer: Commercial Managed Care - PPO | Admitting: Internal Medicine

## 2013-01-05 ENCOUNTER — Encounter: Payer: Self-pay | Admitting: Internal Medicine

## 2013-01-05 VITALS — BP 146/88 | HR 72 | Temp 97.7°F | Ht 69.75 in | Wt 271.0 lb

## 2013-01-05 DIAGNOSIS — E119 Type 2 diabetes mellitus without complications: Secondary | ICD-10-CM

## 2013-01-05 DIAGNOSIS — N529 Male erectile dysfunction, unspecified: Secondary | ICD-10-CM

## 2013-01-05 DIAGNOSIS — K219 Gastro-esophageal reflux disease without esophagitis: Secondary | ICD-10-CM

## 2013-01-05 DIAGNOSIS — E291 Testicular hypofunction: Secondary | ICD-10-CM

## 2013-01-05 DIAGNOSIS — Z8639 Personal history of other endocrine, nutritional and metabolic disease: Secondary | ICD-10-CM

## 2013-01-05 DIAGNOSIS — Z8739 Personal history of other diseases of the musculoskeletal system and connective tissue: Secondary | ICD-10-CM

## 2013-01-05 DIAGNOSIS — R7989 Other specified abnormal findings of blood chemistry: Secondary | ICD-10-CM

## 2013-01-05 DIAGNOSIS — I10 Essential (primary) hypertension: Secondary | ICD-10-CM

## 2013-01-05 DIAGNOSIS — Z0000 Encounter for general adult medical examination without abnormal findings: Secondary | ICD-10-CM

## 2013-01-05 DIAGNOSIS — Z862 Personal history of diseases of the blood and blood-forming organs and certain disorders involving the immune mechanism: Secondary | ICD-10-CM

## 2013-01-05 DIAGNOSIS — E669 Obesity, unspecified: Secondary | ICD-10-CM

## 2013-01-05 LAB — POCT URINALYSIS DIPSTICK
Protein, UA: NEGATIVE
Spec Grav, UA: 1.01
Urobilinogen, UA: NEGATIVE

## 2013-01-21 ENCOUNTER — Other Ambulatory Visit: Payer: Self-pay

## 2013-01-21 MED ORDER — ESOMEPRAZOLE STRONTIUM 49.3 MG PO CPDR: 49.3000 mg | DELAYED_RELEASE_CAPSULE | Freq: Every day | ORAL | Status: DC

## 2013-02-16 ENCOUNTER — Other Ambulatory Visit: Payer: Self-pay | Admitting: Internal Medicine

## 2013-02-16 NOTE — Telephone Encounter (Signed)
Please refill x one year 

## 2013-05-10 ENCOUNTER — Ambulatory Visit
Admission: RE | Admit: 2013-05-10 | Discharge: 2013-05-10 | Disposition: A | Discharge status: Home / Self Care | Payer: Commercial Managed Care - PPO | Source: Ambulatory Visit | Attending: Internal Medicine | Admitting: Internal Medicine

## 2013-05-10 ENCOUNTER — Ambulatory Visit (INDEPENDENT_AMBULATORY_CARE_PROVIDER_SITE_OTHER): Payer: Commercial Managed Care - PPO | Admitting: Internal Medicine

## 2013-05-10 ENCOUNTER — Encounter: Payer: Self-pay | Admitting: Internal Medicine

## 2013-05-10 VITALS — BP 142/88 | HR 76 | Temp 97.6°F | Ht 70.0 in | Wt 275.0 lb

## 2013-05-10 DIAGNOSIS — M25562 Pain in left knee: Secondary | ICD-10-CM

## 2013-05-10 DIAGNOSIS — M25569 Pain in unspecified knee: Secondary | ICD-10-CM

## 2013-05-10 MED ORDER — METHYLPREDNISOLONE ACETATE 80 MG/ML IJ SUSP
80.0000 mg | Freq: Once | INTRAMUSCULAR | Status: AC
  Administered 2013-05-11: 80 mg via INTRAMUSCULAR

## 2013-05-10 MED ORDER — LIDOCAINE HCL (PF) 1 % IJ SOLN
2.0000 mL | Freq: Once | INTRAMUSCULAR | Status: AC
  Administered 2013-05-11: 2 mL

## 2013-05-16 NOTE — Progress Notes (Signed)
  Subjective:    Patient ID: [name] [name], male    DOB: 23-Feb-1951, 62 y.o.   MRN: [id number]  HPI 62 year old Black male with history of osteoarthritis both knees. Has had left knee pain for 3 weeks. 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 Pain is medial. Not a lot of swelling. No recent injury.    Review of Systems     Objective:   Physical Exam crepitus left knee. After sterile prep and drape and local anesthesia with 1% Xylocaine, left knee was injected with a combination of methylprednisolone, Marcaine, 1% Xylocaine. Patient tolerated procedure well. No complications.        Assessment & Plan:  Osteoarthritis left knee  Plan: See injection given above. May take Norco 5/325 as needed for pain

## 2013-07-04 DIAGNOSIS — Z8739 Personal history of other diseases of the musculoskeletal system and connective tissue: Secondary | ICD-10-CM | POA: Insufficient documentation

## 2013-07-04 DIAGNOSIS — R7989 Other specified abnormal findings of blood chemistry: Secondary | ICD-10-CM | POA: Insufficient documentation

## 2013-07-04 NOTE — Patient Instructions (Signed)
Continue same medications and return in 6 months 

## 2013-07-04 NOTE — Progress Notes (Signed)
   Subjective:    Patient ID: [name] [name], male    DOB: 02/16/1951, 62 y.o.   MRN: [id number]  HPI   62 year old black male with history of type 2 diabetes mellitus, hypertension, obesity, GE reflux, erectile dysfunction for health maintenance and evaluation of medical issues. Diabetes has been diet controlled. Watches his blood pressure at work on a daily basis. GE reflux treated with PPI. Also uses testosterone replacement. History of Schatzki's ring dilatated by Dr. Leone Payor 27-Nov-2006. History of arthritis right knee. History of left shoulder pain.  Past medical history: No history of hospitalizations.  Social history: He is employed as an Educational psychologist at [employer]. He completed 3 years of college. He is married. Does not smoke or consume alcohol. 3 adult children 2 sons and a daughter. One son has had a kidney transplant due to severe hypertension and chronic kidney disease.  Family history: Mother died at age 48 in Nov 26, 2001 of a CVA.  Remote history of elevated liver functions thought to be due to fatty liver. This improved when he lost 18 pounds in 11-27-06. In 2006/11/27 he weighed 300 pounds.  History of gout involving right toe and foot.  History of low testosterone level to 233.44 in 11/26/09 for which Androgel was prescribed: 3 pumps to shoulder daily.    Review of Systems  Constitutional: Negative.   HENT: Negative.   Eyes: Negative.   Respiratory: Negative.   Cardiovascular:       History of hypertension  Gastrointestinal:       History of Schatzki's ring and GE reflux  Endocrine: Negative.   Genitourinary:       Low testosterone and erectile dysfunction  Musculoskeletal:        Osteoarthritis right knee  Allergic/Immunologic: Negative.   Neurological: Negative.   Hematological: Negative.   Psychiatric/Behavioral: Negative.        Objective:   Physical Exam  Vitals reviewed. Constitutional: He appears well-developed and well-nourished. No distress.  HENT:    Head: Normocephalic and atraumatic.  Right Ear: External ear normal.  Left Ear: External ear normal.  Mouth/Throat: Oropharynx is clear and moist. No oropharyngeal exudate.  Eyes: Conjunctivae and EOM are normal. Pupils are equal, round, and reactive to light. Right eye exhibits no discharge. Left eye exhibits no discharge. No scleral icterus.  Neck: Normal range of motion. Neck supple. No JVD present. No thyromegaly present.  Cardiovascular: Normal rate, regular rhythm, normal heart sounds and intact distal pulses.   No murmur heard. Pulmonary/Chest: Effort normal and breath sounds normal. No respiratory distress. He has no wheezes. He has no rales.  Abdominal: Soft. Bowel sounds are normal. He exhibits no distension and no mass. There is no tenderness. There is no rebound and no guarding.  Genitourinary: Prostate normal.  Musculoskeletal: He exhibits no edema.  Osteoarthritis both knees  Lymphadenopathy:    He has no cervical adenopathy.  Neurological: He is alert. He has normal reflexes. No cranial nerve deficit. Coordination normal.  Skin: Skin is warm and dry. No rash noted. He is not diaphoretic.  Psychiatric: He has a normal mood and affect. His behavior is normal. Judgment and thought content normal.          Assessment & Plan:  Hypertension  Erectile dysfunction  Type 2 diabetes mellitus-diet control  Obesity  Low testosterone level  GE reflux  Osteoarthritis of knees  History of gout  Plan: Continue same medications and return in 6 months. Watch diet.

## 2013-07-13 ENCOUNTER — Other Ambulatory Visit: Payer: Self-pay | Admitting: Internal Medicine

## 2013-07-15 ENCOUNTER — Ambulatory Visit: Payer: Commercial Managed Care - PPO | Admitting: Internal Medicine

## 2013-08-13 ENCOUNTER — Ambulatory Visit: Payer: Commercial Managed Care - PPO | Admitting: Internal Medicine

## 2013-08-19 ENCOUNTER — Encounter: Payer: Self-pay | Admitting: Internal Medicine

## 2013-08-19 ENCOUNTER — Ambulatory Visit (INDEPENDENT_AMBULATORY_CARE_PROVIDER_SITE_OTHER): Payer: Commercial Managed Care - PPO | Admitting: Internal Medicine

## 2013-08-19 VITALS — BP 126/82 | HR 68 | Temp 98.0°F | Wt 278.0 lb

## 2013-08-19 DIAGNOSIS — E119 Type 2 diabetes mellitus without complications: Secondary | ICD-10-CM

## 2013-08-19 DIAGNOSIS — I10 Essential (primary) hypertension: Secondary | ICD-10-CM

## 2013-08-19 DIAGNOSIS — Z23 Encounter for immunization: Secondary | ICD-10-CM

## 2013-08-19 DIAGNOSIS — K219 Gastro-esophageal reflux disease without esophagitis: Secondary | ICD-10-CM

## 2013-08-19 DIAGNOSIS — M170 Bilateral primary osteoarthritis of knee: Secondary | ICD-10-CM

## 2013-08-19 DIAGNOSIS — M1710 Unilateral primary osteoarthritis, unspecified knee: Secondary | ICD-10-CM

## 2013-08-19 MED ORDER — PNEUMOCOCCAL VAC POLYVALENT 25 MCG/0.5ML IJ INJ: 0.5000 mL | INJECTION | INTRAMUSCULAR | Status: DC

## 2013-10-07 ENCOUNTER — Other Ambulatory Visit: Payer: Self-pay

## 2013-10-07 MED ORDER — ESOMEPRAZOLE MAGNESIUM 40 MG PO CPDR: 40.0000 mg | DELAYED_RELEASE_CAPSULE | Freq: Every day | ORAL | Status: DC

## 2013-10-07 MED ORDER — ESOMEPRAZOLE STRONTIUM 49.3 MG PO CPDR: 49.3000 mg | DELAYED_RELEASE_CAPSULE | Freq: Every day | ORAL | Status: DC

## 2013-10-07 MED ORDER — NEXIUM 40 MG PO CPDR: 40.0000 mg | DELAYED_RELEASE_CAPSULE | Freq: Every day | ORAL | Status: DC

## 2014-02-13 NOTE — Patient Instructions (Signed)
Continue same medications. Return in August for physical examination.

## 2014-02-13 NOTE — Progress Notes (Signed)
   Subjective:    Patient ID: [name] [name], male    DOB: [birth year], 63 y.o.   MRN: [id number]  HPI  Patient in today to followup on hypertension, controlled type 2 diabetes mellitus, erectile dysfunction and GE reflux. No new complaints or problems. Says he feels pretty well except for osteoarthritis in his knees. At last visit in November 2014, left knee was injected with good relief. Diabetes is diet controlled. Blood pressure controlled with Lasix, amlodipine and ACE inhibitor.    Review of Systems     Objective:   Physical Exam  Neck is supple without JVD thyromegaly or carotid bruits. Chest clear. Cardiac exam regular rate and rhythm without ectopy. Extremities without edema.      Assessment & Plan:  Hypertension-stable  Obesity-watch diet. Gets plenty of exercise with his job walking.  Osteoarthritis both knees  Controlled type 2 diabetes with diet alone  GE reflux treated with Nexium  Erectile dysfunction  History of low testosterone level  Plan: Schedule physical exam August 2015

## 2014-02-15 ENCOUNTER — Other Ambulatory Visit: Payer: Commercial Managed Care - PPO | Admitting: Internal Medicine

## 2014-02-15 DIAGNOSIS — I10 Essential (primary) hypertension: Secondary | ICD-10-CM

## 2014-02-15 DIAGNOSIS — Z0000 Encounter for general adult medical examination without abnormal findings: Secondary | ICD-10-CM

## 2014-02-15 DIAGNOSIS — Z125 Encounter for screening for malignant neoplasm of prostate: Secondary | ICD-10-CM

## 2014-02-15 DIAGNOSIS — E119 Type 2 diabetes mellitus without complications: Secondary | ICD-10-CM

## 2014-02-15 LAB — CBC WITH DIFFERENTIAL/PLATELET
BASOS ABS: 0 10*3/uL (ref 0.0–0.1)
Basophils Relative: 1 % (ref 0–1)
Eosinophils Absolute: 0.1 10*3/uL (ref 0.0–0.7)
Eosinophils Relative: 3 % (ref 0–5)
HCT: 40.4 % (ref 39.0–52.0)
Hemoglobin: 13.7 g/dL (ref 13.0–17.0)
LYMPHS PCT: 55 % — AB (ref 12–46)
Lymphs Abs: 2.4 10*3/uL (ref 0.7–4.0)
MCH: 30.6 pg (ref 26.0–34.0)
MCHC: 33.9 g/dL (ref 30.0–36.0)
MCV: 90.2 fL (ref 78.0–100.0)
Monocytes Absolute: 0.4 10*3/uL (ref 0.1–1.0)
Monocytes Relative: 8 % (ref 3–12)
NEUTROS ABS: 1.5 10*3/uL — AB (ref 1.7–7.7)
NEUTROS PCT: 33 % — AB (ref 43–77)
Platelets: 268 10*3/uL (ref 150–400)
RBC: 4.48 MIL/uL (ref 4.22–5.81)
RDW: 13.6 % (ref 11.5–15.5)
WBC: 4.4 10*3/uL (ref 4.0–10.5)

## 2014-02-15 LAB — COMPREHENSIVE METABOLIC PANEL
ALBUMIN: 4.3 g/dL (ref 3.5–5.2)
ALK PHOS: 96 U/L (ref 39–117)
ALT: 16 U/L (ref 0–53)
AST: 20 U/L (ref 0–37)
BUN: 14 mg/dL (ref 6–23)
CHLORIDE: 105 meq/L (ref 96–112)
CO2: 26 mEq/L (ref 19–32)
CREATININE: 0.91 mg/dL (ref 0.50–1.35)
Calcium: 9.4 mg/dL (ref 8.4–10.5)
Glucose, Bld: 105 mg/dL — ABNORMAL HIGH (ref 70–99)
POTASSIUM: 4.2 meq/L (ref 3.5–5.3)
Sodium: 140 mEq/L (ref 135–145)
Total Bilirubin: 1.1 mg/dL (ref 0.2–1.2)
Total Protein: 7.6 g/dL (ref 6.0–8.3)

## 2014-02-15 LAB — LIPID PANEL
CHOL/HDL RATIO: 3.7 ratio
Cholesterol: 213 mg/dL — ABNORMAL HIGH (ref 0–200)
HDL: 58 mg/dL (ref >39)
LDL CALC: 138 mg/dL — AB (ref 0–99)
Triglycerides: 83 mg/dL (ref <150)
VLDL: 17 mg/dL (ref 0–40)

## 2014-02-15 LAB — HEMOGLOBIN A1C
Hgb A1c MFr Bld: 6.4 % — ABNORMAL HIGH (ref <5.7)
Mean Plasma Glucose: 137 mg/dL — ABNORMAL HIGH (ref <117)

## 2014-02-16 LAB — PSA: PSA: 1.01 ng/mL (ref <=4.00)

## 2014-02-17 ENCOUNTER — Ambulatory Visit (INDEPENDENT_AMBULATORY_CARE_PROVIDER_SITE_OTHER): Payer: Commercial Managed Care - PPO | Admitting: Internal Medicine

## 2014-02-17 ENCOUNTER — Encounter: Payer: Self-pay | Admitting: Internal Medicine

## 2014-02-17 VITALS — BP 144/90 | HR 68 | Ht 69.75 in | Wt 280.0 lb

## 2014-02-17 DIAGNOSIS — Z8739 Personal history of other diseases of the musculoskeletal system and connective tissue: Secondary | ICD-10-CM

## 2014-02-17 DIAGNOSIS — E669 Obesity, unspecified: Secondary | ICD-10-CM

## 2014-02-17 DIAGNOSIS — M1710 Unilateral primary osteoarthritis, unspecified knee: Secondary | ICD-10-CM

## 2014-02-17 DIAGNOSIS — K219 Gastro-esophageal reflux disease without esophagitis: Secondary | ICD-10-CM

## 2014-02-17 DIAGNOSIS — E291 Testicular hypofunction: Secondary | ICD-10-CM

## 2014-02-17 DIAGNOSIS — N529 Male erectile dysfunction, unspecified: Secondary | ICD-10-CM

## 2014-02-17 DIAGNOSIS — Z0000 Encounter for general adult medical examination without abnormal findings: Secondary | ICD-10-CM

## 2014-02-17 DIAGNOSIS — Z8639 Personal history of other endocrine, nutritional and metabolic disease: Secondary | ICD-10-CM

## 2014-02-17 DIAGNOSIS — R7989 Other specified abnormal findings of blood chemistry: Secondary | ICD-10-CM

## 2014-02-17 DIAGNOSIS — E119 Type 2 diabetes mellitus without complications: Secondary | ICD-10-CM

## 2014-02-17 DIAGNOSIS — M170 Bilateral primary osteoarthritis of knee: Secondary | ICD-10-CM

## 2014-02-17 DIAGNOSIS — Z862 Personal history of diseases of the blood and blood-forming organs and certain disorders involving the immune mechanism: Secondary | ICD-10-CM

## 2014-02-17 DIAGNOSIS — I10 Essential (primary) hypertension: Secondary | ICD-10-CM

## 2014-02-17 LAB — POCT URINALYSIS DIPSTICK
Bilirubin, UA: NEGATIVE
Blood, UA: NEGATIVE
GLUCOSE UA: NEGATIVE
KETONES UA: NEGATIVE
Leukocytes, UA: NEGATIVE
Nitrite, UA: NEGATIVE
Protein, UA: NEGATIVE
SPEC GRAV UA: 1.025
UROBILINOGEN UA: 2
pH, UA: 5

## 2014-02-17 MED ORDER — METFORMIN HCL ER 500 MG PO TB24
500.0000 mg | ORAL_TABLET | Freq: Every day | ORAL | Status: DC
Start: 2014-02-17 — End: 2014-08-19

## 2014-02-17 MED ORDER — ATORVASTATIN CALCIUM 10 MG PO TABS: 10.0000 mg | ORAL_TABLET | Freq: Every day | ORAL | Status: DC

## 2014-02-17 NOTE — Patient Instructions (Signed)
Start metformin 500 mg ER daily at breakfast. Start Lipitor 10 mg daily at supper. Return in 2 weeks for blood pressure check. Return in 3 months for office visit, lipid panel liver functions and hemoglobin A1c

## 2014-02-18 LAB — MICROALBUMIN, URINE: MICROALB UR: 0.82 mg/dL (ref 0.00–1.89)

## 2014-03-04 ENCOUNTER — Ambulatory Visit: Payer: Commercial Managed Care - PPO | Admitting: Internal Medicine

## 2014-03-25 ENCOUNTER — Ambulatory Visit: Payer: Commercial Managed Care - PPO | Admitting: Internal Medicine

## 2014-03-28 ENCOUNTER — Ambulatory Visit (INDEPENDENT_AMBULATORY_CARE_PROVIDER_SITE_OTHER): Payer: Commercial Managed Care - PPO | Admitting: Internal Medicine

## 2014-03-28 ENCOUNTER — Encounter: Payer: Self-pay | Admitting: Internal Medicine

## 2014-03-28 VITALS — BP 130/80

## 2014-03-28 DIAGNOSIS — I10 Essential (primary) hypertension: Secondary | ICD-10-CM

## 2014-03-28 NOTE — Patient Instructions (Signed)
Return in November for follow up of diabetes and lipids.

## 2014-03-28 NOTE — Progress Notes (Signed)
   Subjective:    Patient ID: [name] [name], male    DOB: Nov 24, 1950, 63 y.o.   MRN: [id number]  HPI  Needed follow up today on HTN. Has been watching BP at work in am and it has been stable and acceptable generally [phone]  systolically and normal diastolically. Plans to retire in June 2016 and then may return to work 12 hours a week later on. Likes to work in [place]. However knees bother him walking long distances in [employer].    Review of Systems     Objective:   Physical Exam  Chest clear to auscultation. Cor:RRR ,  Ext: no edema      Assessment & Plan:  HTN-stable on current multi drug regimen Hyperlipidemia AODM  RTC   November to follow up on DM and lipids.

## 2014-04-03 ENCOUNTER — Encounter: Payer: Self-pay | Admitting: Internal Medicine

## 2014-04-03 NOTE — Progress Notes (Signed)
Subjective:    Patient ID: [name] [name], male    DOB: March 16, 1951, 63 y.o.   MRN: [id number]  HPI 63 year old Black Male in today for health maintenance exam . History of hypertension and diabetes mellitus. Blood pressure is elevated 144/90. He works very hard has to walk a lot with his job at [employer]. Has osteoarthritis of his knees. He is on Lasix amlodipine and lisinopril. He takes Nexium for GE reflux. Has history of erectile dysfunction.  No known drug allergies  Past medical history: History of Schatzki's ring dilated by Dr. Carlean Purl in October 24, 2006. History of left shoulder pain. Diabetes has been diet controlled. He watches his blood pressure on a daily basis. Says it runs better at home that does here in this office. No history of hospitalizations.  Social history: He is employed as an Archivist at [employer]. He completed 3 years of college. He is married. Does not smoke or consume alcohol. 3 adult children, 2 sons and a daughter. One son is had a kidney transplant due to severe hypertension and chronic kidney disease.  Patient has remote history of elevated liver functions thought to be due to fatty liver. This improved when he lost 18 pounds in 10-24-06. In 10-24-2006 he weighed 300 pounds. History of gout involving right toe and foot. History of low testosterone level to 33.44 and 10/23/2009 for which she and her gel was prescribed 3 pumps to shoulder daily.  Family history: Mother died at age 51 in October 23, 2001 of a CVA    Review of Systems  Constitutional: Positive for fatigue.  Respiratory: Negative.   Cardiovascular: Negative for chest pain.  Gastrointestinal:       History of GE reflux  Musculoskeletal:       Bilateral leg pain in left shoulder and  Neurological: Negative.   Psychiatric/Behavioral: Negative.        Objective:   Physical Exam  Vitals reviewed. Constitutional: He is oriented to person, place, and time. He appears well-developed and well-nourished. No  distress.  HENT:  Head: Normocephalic and atraumatic.  Right Ear: External ear normal.  Mouth/Throat: Oropharynx is clear and moist. No oropharyngeal exudate.  Eyes: Conjunctivae are normal. Pupils are equal, round, and reactive to light. Right eye exhibits no discharge. No scleral icterus.  Neck: Neck supple. No JVD present. No thyromegaly present.  Cardiovascular: Normal rate, regular rhythm, normal heart sounds and intact distal pulses.   No murmur heard. Pulmonary/Chest: Effort normal and breath sounds normal. No respiratory distress. He has no wheezes. He has no rales.  Abdominal: He exhibits no distension and no mass. There is no rebound and no guarding.  Genitourinary: Prostate normal.  Musculoskeletal: Normal range of motion. He exhibits no edema.  Lymphadenopathy:    He has no cervical adenopathy.  Neurological: He is alert and oriented to person, place, and time. He has normal reflexes. No cranial nerve deficit. Coordination normal.  Skin: Skin is warm and dry. No rash noted. He is not diaphoretic.  Psychiatric: He has a normal mood and affect. His behavior is normal. Judgment and thought content normal.          Assessment & Plan:  Hypertension-blood pressure is elevated today. Return in 2 weeks for blood pressure check.  Hyperlipidemia-LDL is 138 and he has diabetes  Type 2 diabetes mellitus-currently diet controlled. Hemoglobin A1c has increased from 6.1 6.3%  Plan: Start Lipitor 10 mg daily at supper. Start metformin 500 mg ER daily at breakfast. Return  in 2 weeks for blood pressure check. Return in 3 months for office visit, lipid panel, liver functions and hemoglobin A1c.

## 2014-04-04 ENCOUNTER — Other Ambulatory Visit: Payer: Self-pay

## 2014-04-04 MED ORDER — VIAGRA 100 MG PO TABS: ORAL_TABLET | ORAL | Status: DC

## 2014-04-04 NOTE — Telephone Encounter (Signed)
Viagra refill faxed to [facility].

## 2014-05-26 ENCOUNTER — Other Ambulatory Visit: Payer: Commercial Managed Care - PPO | Admitting: Internal Medicine

## 2014-05-26 DIAGNOSIS — E119 Type 2 diabetes mellitus without complications: Secondary | ICD-10-CM

## 2014-05-26 DIAGNOSIS — I10 Essential (primary) hypertension: Secondary | ICD-10-CM

## 2014-05-26 DIAGNOSIS — E785 Hyperlipidemia, unspecified: Secondary | ICD-10-CM

## 2014-05-26 LAB — HEPATIC FUNCTION PANEL
ALBUMIN: 4.1 g/dL (ref 3.5–5.2)
ALK PHOS: 87 U/L (ref 39–117)
ALT: 15 U/L (ref 0–53)
AST: 19 U/L (ref 0–37)
BILIRUBIN DIRECT: 0.2 mg/dL (ref 0.0–0.3)
BILIRUBIN TOTAL: 0.9 mg/dL (ref 0.2–1.2)
Indirect Bilirubin: 0.7 mg/dL (ref 0.2–1.2)
Total Protein: 7.3 g/dL (ref 6.0–8.3)

## 2014-05-26 LAB — LIPID PANEL
Cholesterol: 144 mg/dL (ref 0–200)
HDL: 54 mg/dL (ref >39)
LDL Cholesterol: 80 mg/dL (ref 0–99)
Total CHOL/HDL Ratio: 2.7 Ratio
Triglycerides: 52 mg/dL (ref <150)
VLDL: 10 mg/dL (ref 0–40)

## 2014-05-26 LAB — HEMOGLOBIN A1C
HEMOGLOBIN A1C: 6.3 % — AB (ref <5.7)
Mean Plasma Glucose: 134 mg/dL — ABNORMAL HIGH (ref <117)

## 2014-05-27 ENCOUNTER — Ambulatory Visit (INDEPENDENT_AMBULATORY_CARE_PROVIDER_SITE_OTHER): Payer: Commercial Managed Care - PPO | Admitting: Internal Medicine

## 2014-05-27 ENCOUNTER — Encounter: Payer: Self-pay | Admitting: Internal Medicine

## 2014-05-27 VITALS — BP 130/80 | HR 78 | Temp 97.3°F | Ht 69.75 in | Wt 280.0 lb

## 2014-05-27 DIAGNOSIS — E8881 Metabolic syndrome: Secondary | ICD-10-CM

## 2014-05-27 DIAGNOSIS — E785 Hyperlipidemia, unspecified: Secondary | ICD-10-CM

## 2014-05-27 DIAGNOSIS — E669 Obesity, unspecified: Secondary | ICD-10-CM

## 2014-05-27 DIAGNOSIS — N529 Male erectile dysfunction, unspecified: Secondary | ICD-10-CM

## 2014-05-27 DIAGNOSIS — I10 Essential (primary) hypertension: Secondary | ICD-10-CM

## 2014-05-27 DIAGNOSIS — E119 Type 2 diabetes mellitus without complications: Secondary | ICD-10-CM

## 2014-05-27 DIAGNOSIS — K219 Gastro-esophageal reflux disease without esophagitis: Secondary | ICD-10-CM

## 2014-05-27 MED ORDER — TERBINAFINE HCL 250 MG PO TABS: 250.0000 mg | ORAL_TABLET | Freq: Every day | ORAL | Status: DC

## 2014-05-27 NOTE — Patient Instructions (Signed)
Continue diet exercise and weight loss efforts. Continue same medications. Return Spring 2015 for office visit, hemoglobin A1c, lipid panel liver functions. Take Lamisil for toenail fungus.

## 2014-06-27 ENCOUNTER — Other Ambulatory Visit: Payer: Self-pay | Admitting: Internal Medicine

## 2014-08-19 ENCOUNTER — Other Ambulatory Visit: Payer: Self-pay | Admitting: *Deleted

## 2014-08-19 MED ORDER — METFORMIN HCL ER 500 MG PO TB24: 500.0000 mg | ORAL_TABLET | Freq: Every day | ORAL | Status: DC

## 2014-08-19 NOTE — Telephone Encounter (Signed)
Refills on Metformin sent to patient pharmacy

## 2014-08-28 ENCOUNTER — Encounter: Payer: Self-pay | Admitting: Internal Medicine

## 2014-08-28 NOTE — Progress Notes (Signed)
   Subjective:    Patient ID: [name] [name], male    DOB: 1951/07/03, 64 y.o.   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 He has a stressful job. His blood pressure is elevated today but he's been running all day and doesn't get a lot of breaks. Starts his day very early. Tends to take a break around 10 or 11 AM for lunch and then works until late afternoon. He is on call frequently as well. Hemoglobin A1c is stable despite this hard schedule. Lipid panel normal. He is on statin medication. Initially when he arrived his blood pressure was 381 systolically but came down to 8:29 systolically after resting.    Review of Systems     Objective:   Physical Exam  Skin warm and dry. Nodes none. Neck supple without JVD thyromegaly or carotid bruits. Chest clear. Cardiac exam regular rate and rhythm. Extremities without edema.      Assessment & Plan:  Essential hypertension-stable but tends to be a bit labile with stress  Hyperlipidemia-LDL cholesterol has improved from 138-80  Type 2 diabetes mellitus-A1c is down 0.1%  Obesity  Erectile dysfunction  GE reflux  Plan: Return spring 2016 for physical exam. Continue same medications.

## 2014-09-20 ENCOUNTER — Other Ambulatory Visit: Payer: Commercial Managed Care - PPO | Admitting: Internal Medicine

## 2014-09-22 ENCOUNTER — Other Ambulatory Visit: Payer: 59 | Admitting: Internal Medicine

## 2014-09-22 DIAGNOSIS — E119 Type 2 diabetes mellitus without complications: Secondary | ICD-10-CM

## 2014-09-22 DIAGNOSIS — Z79899 Other long term (current) drug therapy: Secondary | ICD-10-CM

## 2014-09-22 DIAGNOSIS — E785 Hyperlipidemia, unspecified: Secondary | ICD-10-CM

## 2014-09-22 LAB — HEPATIC FUNCTION PANEL
ALT: 15 U/L (ref 0–53)
AST: 20 U/L (ref 0–37)
Albumin: 4.2 g/dL (ref 3.5–5.2)
Alkaline Phosphatase: 83 U/L (ref 39–117)
BILIRUBIN DIRECT: 0.2 mg/dL (ref 0.0–0.3)
BILIRUBIN INDIRECT: 0.8 mg/dL (ref 0.2–1.2)
Total Bilirubin: 1 mg/dL (ref 0.2–1.2)
Total Protein: 7.2 g/dL (ref 6.0–8.3)

## 2014-09-22 LAB — LIPID PANEL
CHOL/HDL RATIO: 2.6 ratio
Cholesterol: 149 mg/dL (ref 0–200)
HDL: 57 mg/dL (ref >=40)
LDL Cholesterol: 81 mg/dL (ref 0–99)
Triglycerides: 57 mg/dL (ref <150)
VLDL: 11 mg/dL (ref 0–40)

## 2014-09-22 LAB — HEMOGLOBIN A1C
HEMOGLOBIN A1C: 6.3 % — AB (ref <5.7)
MEAN PLASMA GLUCOSE: 134 mg/dL — AB (ref <117)

## 2014-09-23 ENCOUNTER — Encounter: Payer: Self-pay | Admitting: Internal Medicine

## 2014-09-23 ENCOUNTER — Ambulatory Visit (INDEPENDENT_AMBULATORY_CARE_PROVIDER_SITE_OTHER): Payer: 59 | Admitting: Internal Medicine

## 2014-09-23 VITALS — BP 138/78 | HR 77 | Temp 97.7°F | Ht 69.75 in | Wt 283.0 lb

## 2014-09-23 DIAGNOSIS — E8881 Metabolic syndrome: Secondary | ICD-10-CM | POA: Diagnosis not present

## 2014-09-23 DIAGNOSIS — I10 Essential (primary) hypertension: Secondary | ICD-10-CM | POA: Diagnosis not present

## 2014-09-23 DIAGNOSIS — E119 Type 2 diabetes mellitus without complications: Secondary | ICD-10-CM

## 2014-09-23 DIAGNOSIS — N529 Male erectile dysfunction, unspecified: Secondary | ICD-10-CM

## 2014-09-23 DIAGNOSIS — E669 Obesity, unspecified: Secondary | ICD-10-CM

## 2014-09-23 DIAGNOSIS — E785 Hyperlipidemia, unspecified: Secondary | ICD-10-CM

## 2014-09-24 ENCOUNTER — Encounter: Payer: Self-pay | Admitting: Internal Medicine

## 2014-09-24 DIAGNOSIS — E785 Hyperlipidemia, unspecified: Secondary | ICD-10-CM | POA: Insufficient documentation

## 2014-09-24 DIAGNOSIS — E8881 Metabolic syndrome: Secondary | ICD-10-CM | POA: Insufficient documentation

## 2014-09-24 NOTE — Progress Notes (Signed)
   Subjective:    Patient ID: [name] [name], male    DOB: 1950-09-11, 64 y.o.   MRN: [id number]  HPI  In today to follow-up on type 2 diabetes mellitus and essential hypertension. History of obesity, low testosterone level, erectile dysfunction. History of GE reflux. History of hyperlipidemia treated with low-dose Lipitor. He is retiring in June. He is going to take 6 months off and then return to work part-time. Wife has already retired but works part-time in the school system. They're planning a trip to the [place] in the early summer. History of onychomycosis treated with Lamisil.    Review of Systems     Objective:   Physical Exam  Skin warm and dry. Toenail fungus improved bilaterally. Neck is supple without JVD thyromegaly or carotid bruits. Chest clear. Cardiac exam regular rate and rhythm normal S1 and S2. Lab work reviewed with him. Hemoglobin A1c stable at 6.3%. Lipid panel liver functions are normal.       Assessment & Plan:  Type 2 diabetes mellitus-and globin A1c 6.3% and stable. Reminded about annual diabetic eye exam.  Hyperlipidemia-stable on statin medication  Essential hypertension-stable  GE reflux-treated with PPI  History of gout-currently not symptomatic  Onychomycosis-continue with Lamisil. It is improving  Obesity-encouraged diet and exercise  Erectile dysfunction-samples of Viagra given

## 2014-09-24 NOTE — Patient Instructions (Signed)
Encouraged diet exercise and weight loss. Continue same medications. Return in August for physical examination.

## 2014-10-03 ENCOUNTER — Other Ambulatory Visit: Payer: Self-pay | Admitting: Internal Medicine

## 2014-10-03 ENCOUNTER — Other Ambulatory Visit: Payer: Self-pay | Admitting: *Deleted

## 2014-10-03 MED ORDER — TADALAFIL 5 MG PO TABS: 5.0000 mg | ORAL_TABLET | Freq: Every day | ORAL | Status: DC | PRN

## 2014-10-03 NOTE — Telephone Encounter (Signed)
Changed patients medication from Viagra to Cialis 5 mg daily per Dr Renold Genta

## 2014-10-04 ENCOUNTER — Other Ambulatory Visit: Payer: Self-pay | Admitting: *Deleted

## 2014-10-04 MED ORDER — TADALAFIL 20 MG PO TABS: ORAL_TABLET | ORAL | Status: DC

## 2014-11-28 ENCOUNTER — Other Ambulatory Visit: Payer: Self-pay | Admitting: *Deleted

## 2014-11-28 MED ORDER — TERBINAFINE HCL 250 MG PO TABS: 250.0000 mg | ORAL_TABLET | Freq: Every day | ORAL | Status: DC

## 2014-12-09 ENCOUNTER — Telehealth: Payer: Self-pay | Admitting: *Deleted

## 2014-12-09 NOTE — Telephone Encounter (Signed)
Patient called he would like a script for Viagra. He wants it printed for pick up today if possible. He requests we not send it to [place] he wants a paper script he can take to another Pharmacy.

## 2014-12-09 NOTE — Telephone Encounter (Signed)
Written Rx for Viagra 100 mg one month supply with prn one year refills

## 2015-02-24 ENCOUNTER — Other Ambulatory Visit: Payer: 59 | Admitting: Internal Medicine

## 2015-02-27 ENCOUNTER — Ambulatory Visit (INDEPENDENT_AMBULATORY_CARE_PROVIDER_SITE_OTHER): Payer: 59 | Admitting: Internal Medicine

## 2015-02-27 ENCOUNTER — Encounter: Payer: Self-pay | Admitting: Internal Medicine

## 2015-02-27 VITALS — BP 128/82 | HR 76 | Temp 97.9°F | Ht 70.0 in | Wt 274.5 lb

## 2015-02-27 DIAGNOSIS — M170 Bilateral primary osteoarthritis of knee: Secondary | ICD-10-CM

## 2015-02-27 DIAGNOSIS — I10 Essential (primary) hypertension: Secondary | ICD-10-CM | POA: Diagnosis not present

## 2015-02-27 DIAGNOSIS — Z8639 Personal history of other endocrine, nutritional and metabolic disease: Secondary | ICD-10-CM

## 2015-02-27 DIAGNOSIS — Z13220 Encounter for screening for lipoid disorders: Secondary | ICD-10-CM | POA: Diagnosis not present

## 2015-02-27 DIAGNOSIS — E349 Endocrine disorder, unspecified: Secondary | ICD-10-CM

## 2015-02-27 DIAGNOSIS — Z8739 Personal history of other diseases of the musculoskeletal system and connective tissue: Secondary | ICD-10-CM

## 2015-02-27 DIAGNOSIS — E119 Type 2 diabetes mellitus without complications: Secondary | ICD-10-CM

## 2015-02-27 DIAGNOSIS — N529 Male erectile dysfunction, unspecified: Secondary | ICD-10-CM | POA: Diagnosis not present

## 2015-02-27 DIAGNOSIS — K219 Gastro-esophageal reflux disease without esophagitis: Secondary | ICD-10-CM

## 2015-02-27 DIAGNOSIS — Z125 Encounter for screening for malignant neoplasm of prostate: Secondary | ICD-10-CM

## 2015-02-27 DIAGNOSIS — E669 Obesity, unspecified: Secondary | ICD-10-CM | POA: Diagnosis not present

## 2015-02-27 DIAGNOSIS — Z0000 Encounter for general adult medical examination without abnormal findings: Secondary | ICD-10-CM

## 2015-02-27 DIAGNOSIS — E7800 Pure hypercholesterolemia, unspecified: Secondary | ICD-10-CM

## 2015-02-27 DIAGNOSIS — E291 Testicular hypofunction: Secondary | ICD-10-CM

## 2015-02-27 LAB — CBC WITH DIFFERENTIAL/PLATELET
Basophils Absolute: 0.1 10*3/uL (ref 0.0–0.1)
Basophils Relative: 1 % (ref 0–1)
Eosinophils Absolute: 0.2 10*3/uL (ref 0.0–0.7)
Eosinophils Relative: 3 % (ref 0–5)
HEMATOCRIT: 42.7 % (ref 39.0–52.0)
HEMOGLOBIN: 14.4 g/dL (ref 13.0–17.0)
LYMPHS ABS: 2.7 10*3/uL (ref 0.7–4.0)
LYMPHS PCT: 54 % — AB (ref 12–46)
MCH: 30.8 pg (ref 26.0–34.0)
MCHC: 33.7 g/dL (ref 30.0–36.0)
MCV: 91.2 fL (ref 78.0–100.0)
MONO ABS: 0.6 10*3/uL (ref 0.1–1.0)
MONOS PCT: 12 % (ref 3–12)
MPV: 10.1 fL (ref 8.6–12.4)
NEUTROS ABS: 1.5 10*3/uL — AB (ref 1.7–7.7)
Neutrophils Relative %: 30 % — ABNORMAL LOW (ref 43–77)
Platelets: 273 10*3/uL (ref 150–400)
RBC: 4.68 MIL/uL (ref 4.22–5.81)
RDW: 13.7 % (ref 11.5–15.5)
WBC: 5 10*3/uL (ref 4.0–10.5)

## 2015-02-27 LAB — POCT URINALYSIS DIPSTICK
Bilirubin, UA: NEGATIVE
GLUCOSE UA: NEGATIVE
KETONES UA: NEGATIVE
Leukocytes, UA: NEGATIVE
Nitrite, UA: NEGATIVE
PROTEIN UA: NEGATIVE
RBC UA: NEGATIVE
SPEC GRAV UA: 1.01
UROBILINOGEN UA: NEGATIVE
pH, UA: 6.5

## 2015-02-27 LAB — HEMOGLOBIN A1C
Hgb A1c MFr Bld: 6.4 % — ABNORMAL HIGH (ref <5.7)
Mean Plasma Glucose: 137 mg/dL — ABNORMAL HIGH (ref <117)

## 2015-02-27 LAB — COMPLETE METABOLIC PANEL WITH GFR
ALT: 14 U/L (ref 9–46)
AST: 20 U/L (ref 10–35)
Albumin: 4.4 g/dL (ref 3.6–5.1)
Alkaline Phosphatase: 95 U/L (ref 40–115)
BILIRUBIN TOTAL: 1 mg/dL (ref 0.2–1.2)
BUN: 14 mg/dL (ref 7–25)
CALCIUM: 9.4 mg/dL (ref 8.6–10.3)
CO2: 25 mmol/L (ref 20–31)
CREATININE: 0.98 mg/dL (ref 0.70–1.25)
Chloride: 100 mmol/L (ref 98–110)
GFR, Est Non African American: 82 mL/min (ref >=60)
Glucose, Bld: 115 mg/dL — ABNORMAL HIGH (ref 65–99)
Potassium: 4.7 mmol/L (ref 3.5–5.3)
Sodium: 139 mmol/L (ref 135–146)
TOTAL PROTEIN: 7.5 g/dL (ref 6.1–8.1)

## 2015-02-27 LAB — LIPID PANEL
CHOL/HDL RATIO: 2.8 ratio (ref <=5.0)
Cholesterol: 166 mg/dL (ref 125–200)
HDL: 60 mg/dL (ref >=40)
LDL CALC: 93 mg/dL (ref <130)
TRIGLYCERIDES: 66 mg/dL (ref <150)
VLDL: 13 mg/dL (ref <30)

## 2015-02-27 NOTE — Patient Instructions (Signed)
Lab work is pending. Continue same medications and return in 6 months. It was a pleasure to see you today.

## 2015-02-27 NOTE — Progress Notes (Signed)
   Subjective:    Patient ID: [name] [name], male    DOB: [birth year], 64 y.o.   MRN: [id number]  HPI  64 year old white male in today for health maintenance exam and evaluation of medical issues. History of type 2 diabetes mellitus without complications, essential hypertension, erectile dysfunction, low serum testosterone level, obesity, primary osteoarthritis of both knees, GE reflux, history of gout.  No known drug allergies.  In 2008 he weighed 300 pounds. He had elevated liver functions at that time thought to be due to fatty liver. This improved after losing 18 pounds in 2008. History of gout involving right toe and foot. History of low testosterone level in 2011 for which  Androgel was prescribed  Past medical history: Shot scheduling dilated by Dr. Carlean Purl in 2008. No history of hospitalizations.  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 He is married. Does not smoke or consume alcohol. 3 adult children. 2 sons and a daughter. One son with history of kidney transplant due to severe hypertension and chronic kidney disease.  Family history: Mother died at age 14 2002-04-13 of a CVA  Review of Systems  Constitutional: Negative.   All other systems reviewed and are negative.      Objective:   Physical Exam  Constitutional: He is oriented to person, place, and time. He appears well-developed and well-nourished. No distress.  HENT:  Head: Normocephalic and atraumatic.  Right Ear: External ear normal.  Left Ear: External ear normal.  Mouth/Throat: Oropharynx is clear and moist. No oropharyngeal exudate.  Eyes: Conjunctivae and EOM are normal. Pupils are equal, round, and reactive to light. Right eye exhibits no discharge. Left eye exhibits no discharge. No scleral icterus.  Neck: Normal range of motion.  Neck supple. No JVD present.  Cardiovascular: Normal rate, regular rhythm, normal heart sounds and intact distal pulses.   No murmur heard. Pulmonary/Chest: Effort normal and breath sounds normal. No respiratory distress. He has no wheezes. He has no rales.  Abdominal: Soft. Bowel sounds are normal. He exhibits no distension and no mass. There is no tenderness. There is no rebound and no guarding.  Genitourinary: Prostate normal.  Musculoskeletal: Normal range of motion. He exhibits no edema.  Lymphadenopathy:    He has no cervical adenopathy.  Neurological: He is alert and oriented to person, place, and time. He has normal reflexes. No cranial nerve deficit.  Skin: Skin is warm and dry. No rash noted. He is not diaphoretic.  Psychiatric: He has a normal mood and affect. His behavior is normal. Judgment and thought content normal.  Vitals reviewed.         Assessment & Plan:  Essential hypertension-stable on current regimen  Type 2 diabetes mellitus-hemoglobin A1c drawn and pending  Osteoarthritis of knees-stable. He sees Dr. Latanya Maudlin  as needed  Testosterone deficiency  Erectile dysfunction-samples of Viagra given  GE reflux-continue  PPI  Plan: Fasting Labs are pending. Return in 6 months for office visit, lipid panel liver functions and hemoglobin A1c. Continue same medications. Encouraged diet and exercise.

## 2015-02-28 LAB — MICROALBUMIN / CREATININE URINE RATIO
Creatinine, Urine: 112.1 mg/dL
MICROALB UR: 0.4 mg/dL (ref <2.0)
Microalb Creat Ratio: 3.6 mg/g (ref 0.0–30.0)

## 2015-02-28 LAB — PSA: PSA: 0.98 ng/mL (ref <=4.00)

## 2015-03-27 ENCOUNTER — Telehealth: Payer: Self-pay | Admitting: Internal Medicine

## 2015-03-27 MED ORDER — SILDENAFIL CITRATE 100 MG PO TABS: 50.0000 mg | ORAL_TABLET | Freq: Every day | ORAL | Status: DC | PRN

## 2015-03-27 NOTE — Telephone Encounter (Signed)
Refill Viagra x 0ne year and D/c Cialis

## 2015-03-27 NOTE — Telephone Encounter (Signed)
Viagra called into [facility].  Patient aware.

## 2015-03-27 NOTE — Telephone Encounter (Signed)
Patient would like to go back to Viagra instead of Cialis.  He likes how Viagra works better.    Pharmacy:  [facility].  Thank you.

## 2015-04-13 ENCOUNTER — Ambulatory Visit (INDEPENDENT_AMBULATORY_CARE_PROVIDER_SITE_OTHER): Payer: 59 | Admitting: Internal Medicine

## 2015-04-13 ENCOUNTER — Encounter: Payer: Self-pay | Admitting: Internal Medicine

## 2015-04-13 VITALS — BP 138/82 | HR 78 | Temp 98.0°F | Ht 70.0 in | Wt 294.0 lb

## 2015-04-13 DIAGNOSIS — R609 Edema, unspecified: Secondary | ICD-10-CM

## 2015-04-13 DIAGNOSIS — Z23 Encounter for immunization: Secondary | ICD-10-CM | POA: Diagnosis not present

## 2015-04-13 LAB — BASIC METABOLIC PANEL
BUN: 12 mg/dL (ref 7–25)
CHLORIDE: 103 mmol/L (ref 98–110)
CO2: 27 mmol/L (ref 20–31)
Calcium: 9.3 mg/dL (ref 8.6–10.3)
Creat: 0.83 mg/dL (ref 0.70–1.25)
GLUCOSE: 62 mg/dL — AB (ref 65–99)
POTASSIUM: 4.3 mmol/L (ref 3.5–5.3)
SODIUM: 137 mmol/L (ref 135–146)

## 2015-04-13 NOTE — Progress Notes (Signed)
   Subjective:    Patient ID: [name] [name], male    DOB: 01-10-1951, 64 y.o.   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 Has been doing a lot of work around [employer] and around  his house. He is on his feet a fair amount. Recently developed swelling in his feet that is new. He is on Mobic. He is also on 10 mg of amlodipine. Mobic was prescribed for his knees by orthopedist. Denies eating excess salt.    Review of Systems     Objective:   Physical Exam  Skin warm and dry. Chest clear to auscultation. Cardiac exam regular rate and rhythm normal S1 and S2. Extremities nonpitting edema of both feet. No pretibial edema.      Assessment & Plan:  Dependent edema  Plan: Increase Lasix to 40 mg daily. Basic metabolic panel checked. Discontinue Mobic can return in 2 weeks. We may need to decrease dose of amlodipine.

## 2015-04-13 NOTE — Patient Instructions (Signed)
Increase Lasix to 40 mg daily. Discontinue Mobic. Return in 2 weeks.

## 2015-04-14 ENCOUNTER — Telehealth: Payer: Self-pay | Admitting: *Deleted

## 2015-04-14 NOTE — Telephone Encounter (Signed)
Reviewed recent lab results with patient 

## 2015-04-27 ENCOUNTER — Ambulatory Visit (INDEPENDENT_AMBULATORY_CARE_PROVIDER_SITE_OTHER): Payer: 59 | Admitting: Internal Medicine

## 2015-04-27 ENCOUNTER — Encounter: Payer: Self-pay | Admitting: Internal Medicine

## 2015-04-27 VITALS — BP 130/80 | HR 68 | Temp 98.0°F | Resp 20 | Ht 70.0 in | Wt 286.0 lb

## 2015-04-27 DIAGNOSIS — Z5181 Encounter for therapeutic drug level monitoring: Secondary | ICD-10-CM | POA: Diagnosis not present

## 2015-04-27 LAB — BASIC METABOLIC PANEL
BUN: 15 mg/dL (ref 7–25)
CHLORIDE: 104 mmol/L (ref 98–110)
CO2: 25 mmol/L (ref 20–31)
CREATININE: 0.85 mg/dL (ref 0.70–1.25)
Calcium: 9.5 mg/dL (ref 8.6–10.3)
GLUCOSE: 77 mg/dL (ref 65–99)
POTASSIUM: 4.2 mmol/L (ref 3.5–5.3)
Sodium: 138 mmol/L (ref 135–146)

## 2015-04-27 NOTE — Patient Instructions (Signed)
Decrease amlodipine to 5 mg daily. Continue Lasix 40 mg daily. Remain off meloxicam. Return in 3 weeks.

## 2015-04-27 NOTE — Progress Notes (Signed)
   Subjective:    Patient ID: [name] [name], male    DOB: 1950-09-10, 64 y.o.   MRN: [id number]  HPI He stopped taking meloxicam which I thought to be contributing to his edema. He's now on 40 of Lasix daily. He's having urinary frequency of course. Still having issues with edema of his feet. It may be coming from amlodipine. We will  decrease amlodipine to 5 mg daily. He hasn't seen a lot of difference in the edema of his feet. He realizes he's not walking nearly as much as he did when he was working full-time. He plans to return to work in January. He's never had an issue with edema previously.    Review of Systems     Objective:   Physical Exam  Skin warm and dry. Nodes none. Chest clear. Cardiac exam regular rate and rhythm. Extremities without edema.      Assessment & Plan:  Dependent edema  Plan: Continue Lasix 40 mg daily. Decrease amlodipine to 5 mg daily. Return in 3 weeks. Basic metabolic panel drawn. BNP added.

## 2015-04-27 NOTE — Addendum Note (Signed)
Addended by: Amado Coe on: 04/27/2015 12:25 PM   Modules accepted: Orders

## 2015-04-28 LAB — BRAIN NATRIURETIC PEPTIDE: BRAIN NATRIURETIC PEPTIDE: 9.5 pg/mL (ref 0.0–100.0)

## 2015-05-19 ENCOUNTER — Ambulatory Visit (INDEPENDENT_AMBULATORY_CARE_PROVIDER_SITE_OTHER): Payer: 59 | Admitting: Internal Medicine

## 2015-05-19 ENCOUNTER — Encounter: Payer: Self-pay | Admitting: Internal Medicine

## 2015-05-19 VITALS — BP 134/82 | HR 93 | Temp 98.0°F | Resp 20 | Ht 70.0 in | Wt 287.0 lb

## 2015-05-19 DIAGNOSIS — Z23 Encounter for immunization: Secondary | ICD-10-CM

## 2015-05-19 DIAGNOSIS — I10 Essential (primary) hypertension: Secondary | ICD-10-CM | POA: Diagnosis not present

## 2015-05-19 DIAGNOSIS — E669 Obesity, unspecified: Secondary | ICD-10-CM | POA: Diagnosis not present

## 2015-05-19 DIAGNOSIS — R609 Edema, unspecified: Secondary | ICD-10-CM

## 2015-05-19 DIAGNOSIS — Z5181 Encounter for therapeutic drug level monitoring: Secondary | ICD-10-CM

## 2015-05-19 DIAGNOSIS — Z79899 Other long term (current) drug therapy: Secondary | ICD-10-CM | POA: Diagnosis not present

## 2015-05-19 LAB — BASIC METABOLIC PANEL
BUN: 13 mg/dL (ref 7–25)
CO2: 28 mmol/L (ref 20–31)
Calcium: 9.7 mg/dL (ref 8.6–10.3)
Chloride: 106 mmol/L (ref 98–110)
Creat: 1.17 mg/dL (ref 0.70–1.25)
Glucose, Bld: 90 mg/dL (ref 65–99)
Potassium: 4.6 mmol/L (ref 3.5–5.3)
Sodium: 145 mmol/L (ref 135–146)

## 2015-05-19 MED ORDER — ATORVASTATIN CALCIUM 10 MG PO TABS: 10.0000 mg | ORAL_TABLET | Freq: Every day | ORAL | Status: DC

## 2015-05-19 MED ORDER — FUROSEMIDE 40 MG PO TABS
40.0000 mg | ORAL_TABLET | Freq: Every day | ORAL | Status: DC
Start: 2015-05-19 — End: 2016-10-03

## 2015-05-19 NOTE — Progress Notes (Signed)
   Subjective:    Patient ID: [name] [name], male    DOB: 1950/12/09, 64 y.o.   MRN: [id number]  HPI After decreasing dose of amlodipine, increasing dose of Lasix, and holding meloxicam, his dependent edema has finally improved.    Review of Systems     Objective:   Physical Exam  Skin warm and dry. Nodes none. Neck is supple without JVD thyromegaly or carotid bruits. Chest clear to auscultation. Cardiac exam regular rate and rhythm. No significant dependent edema at all.      Assessment & Plan:   Dependent edema improved  Essential hypertension  Erectile dysfunction  Metabolic syndrome  Hyperlipidemia  Plan: He received flu vaccine October 2016. Tetanus immunization up-to-date  given in 2013. Needs Prevnar 13.  Return in February for six-month recheck and physical exam is due in August.

## 2015-06-06 ENCOUNTER — Other Ambulatory Visit: Payer: Self-pay | Admitting: Internal Medicine

## 2015-06-07 NOTE — Patient Instructions (Signed)
Prevnar 13 given today. Return in February for six-month follow-up. Continue same medications.

## 2015-07-07 ENCOUNTER — Other Ambulatory Visit: Payer: Self-pay | Admitting: Internal Medicine

## 2015-08-29 ENCOUNTER — Ambulatory Visit: Payer: 59 | Admitting: Internal Medicine

## 2015-08-31 ENCOUNTER — Other Ambulatory Visit: Payer: 59 | Admitting: Internal Medicine

## 2015-08-31 DIAGNOSIS — Z79899 Other long term (current) drug therapy: Secondary | ICD-10-CM | POA: Diagnosis not present

## 2015-08-31 DIAGNOSIS — E119 Type 2 diabetes mellitus without complications: Secondary | ICD-10-CM

## 2015-08-31 DIAGNOSIS — I10 Essential (primary) hypertension: Secondary | ICD-10-CM

## 2015-08-31 DIAGNOSIS — E785 Hyperlipidemia, unspecified: Secondary | ICD-10-CM | POA: Diagnosis not present

## 2015-08-31 LAB — LIPID PANEL
CHOL/HDL RATIO: 2.9 ratio (ref <=5.0)
CHOLESTEROL: 145 mg/dL (ref 125–200)
HDL: 50 mg/dL (ref >=40)
LDL Cholesterol: 81 mg/dL (ref <130)
Triglycerides: 71 mg/dL (ref <150)
VLDL: 14 mg/dL (ref <30)

## 2015-08-31 LAB — HEPATIC FUNCTION PANEL
ALBUMIN: 4.1 g/dL (ref 3.6–5.1)
ALT: 13 U/L (ref 9–46)
AST: 16 U/L (ref 10–35)
Alkaline Phosphatase: 95 U/L (ref 40–115)
BILIRUBIN DIRECT: 0.2 mg/dL (ref <=0.2)
BILIRUBIN TOTAL: 0.8 mg/dL (ref 0.2–1.2)
Indirect Bilirubin: 0.6 mg/dL (ref 0.2–1.2)
Total Protein: 7.4 g/dL (ref 6.1–8.1)

## 2015-08-31 LAB — HEMOGLOBIN A1C
HEMOGLOBIN A1C: 6.3 % — AB (ref <5.7)
MEAN PLASMA GLUCOSE: 134 mg/dL — AB (ref <117)

## 2015-09-01 ENCOUNTER — Ambulatory Visit (INDEPENDENT_AMBULATORY_CARE_PROVIDER_SITE_OTHER): Payer: 59 | Admitting: Internal Medicine

## 2015-09-01 ENCOUNTER — Encounter: Payer: Self-pay | Admitting: Internal Medicine

## 2015-09-01 VITALS — BP 140/80 | HR 78 | Temp 97.8°F | Resp 20 | Ht 70.0 in | Wt 290.0 lb

## 2015-09-01 DIAGNOSIS — E669 Obesity, unspecified: Secondary | ICD-10-CM | POA: Diagnosis not present

## 2015-09-01 DIAGNOSIS — E785 Hyperlipidemia, unspecified: Secondary | ICD-10-CM

## 2015-09-01 DIAGNOSIS — E8881 Metabolic syndrome: Secondary | ICD-10-CM

## 2015-09-01 DIAGNOSIS — I10 Essential (primary) hypertension: Secondary | ICD-10-CM | POA: Diagnosis not present

## 2015-09-01 DIAGNOSIS — K219 Gastro-esophageal reflux disease without esophagitis: Secondary | ICD-10-CM | POA: Diagnosis not present

## 2015-09-01 DIAGNOSIS — N529 Male erectile dysfunction, unspecified: Secondary | ICD-10-CM | POA: Diagnosis not present

## 2015-09-01 DIAGNOSIS — E119 Type 2 diabetes mellitus without complications: Secondary | ICD-10-CM

## 2015-09-01 NOTE — Patient Instructions (Signed)
Continue same medications and return in 6 months for physical exam. Please try to diet exercise and lose weight. It was a pleasure to see you today.

## 2015-09-01 NOTE — Progress Notes (Addendum)
   Subjective:    Patient ID: [name] [name], male    DOB: 1950/09/18, 65 y.o.   MRN: [id number]  HPI In  today for six-month recheck on essential hypertension, obesity, controlled type 2 diabetes mellitus, hyperlipidemia, metabolic syndrome. He was here in August for physical exam but in the interim developed significant dependent edema which required some adjustments with his medications. It finally resolved after stopping meloxicam, increasing dose of Lasix and decreasing dose of amlodipine. Blood pressure is 140/80 today with a large cuff. He has erectile dysfunction and uses Viagra. Lipid panel and liver functions are normal. He is on statin medication. Hemoglobin A1c is 6.3% and  6 months ago was 6.4%. History of GE reflux treated with PPI and stable.    Review of Systems     Objective:   Physical Exam Skin warm and dry. Nodes none. Chest clear. Cardiac exam regular rate and rhythm. Extremities without edema. See diabetic foot exam. He wears glasses.       Assessment & Plan:  Controlled type 2 diabetes mellitus  Essential hypertension  Obesity-needs to diet exercise and lose weight. Previous weight prior to his retirement was 274.5 pounds and is now 290 pounds. Says he's been following a diabetic diet recently and his wife is following. Hopefully with returning to work, he will lose some weight.  Erectile dysfunction-treated with Viagra  Hyperlipidemia-lipid panel liver function stable  Metabolic syndrome per graph history of dependent edema-resolved  Plan: Return in August for physical examination. He is going back to work in March  2 days a week. Reminded about diabetic eye exam.

## 2015-09-12 MED FILL — VIAGRA 100 MG TABLET: 100 | 90 days supply | Qty: 18 | Fill #1

## 2015-10-06 ENCOUNTER — Other Ambulatory Visit: Payer: Self-pay | Admitting: Internal Medicine

## 2015-10-06 MED FILL — ESOMEPRAZOLE MAG DR 40 MG C: 40 | 90 days supply | Qty: 90 | Fill #0

## 2015-10-06 MED FILL — ATORVASTATIN 10 MG TABLET: 10 | 90 days supply | Qty: 90 | Fill #1

## 2015-10-06 MED FILL — LISINOPRIL 10 MG TABLET: 10 | 90 days supply | Qty: 90 | Fill #1

## 2015-10-06 MED FILL — METFORMIN HCL ER 500 MG TAB: 500 | 90 days supply | Qty: 90 | Fill #0

## 2015-10-06 MED FILL — AMLODIPINE BESYLATE 10 MG T: 10 | 90 days supply | Qty: 90 | Fill #1

## 2015-10-06 MED FILL — FUROSEMIDE 20 MG TABLET: 20 | 90 days supply | Qty: 90 | Fill #1

## 2015-10-06 NOTE — Telephone Encounter (Signed)
Refill x 6 months 

## 2015-10-12 ENCOUNTER — Telehealth: Payer: Self-pay

## 2015-10-12 NOTE — Telephone Encounter (Signed)
Message left for patient to return call. We received a fax refill request from [facility] for his mobic. Dr. Renold Genta states taht she thought we stopped this medication due to patients swelling. I was calling to clarify.

## 2015-10-16 MED ORDER — MELOXICAM 15 MG PO TABS: 15.0000 mg | ORAL_TABLET | Freq: Every day | ORAL | Status: DC

## 2015-10-16 MED FILL — MELOXICAM 15 MG TABLET: 15 | 30 days supply | Qty: 30 | Fill #0

## 2015-10-16 NOTE — Telephone Encounter (Signed)
Refill once 

## 2015-10-16 NOTE — Telephone Encounter (Signed)
Patient states that it turned out to be the blood pressure medication amlodipine that was reduced that was causing the swelling. He states that he is taking it. He states he is going back to work part time and would like to have this for his knees.

## 2015-10-16 NOTE — Telephone Encounter (Signed)
Patient notified

## 2015-10-28 DIAGNOSIS — H52221 Regular astigmatism, right eye: Secondary | ICD-10-CM | POA: Diagnosis not present

## 2015-10-28 DIAGNOSIS — H5203 Hypermetropia, bilateral: Secondary | ICD-10-CM | POA: Diagnosis not present

## 2015-10-28 DIAGNOSIS — H524 Presbyopia: Secondary | ICD-10-CM | POA: Diagnosis not present

## 2015-12-12 ENCOUNTER — Other Ambulatory Visit: Payer: Self-pay

## 2015-12-12 MED ORDER — MELOXICAM 15 MG PO TABS: 15.0000 mg | ORAL_TABLET | Freq: Every day | ORAL | Status: DC

## 2015-12-12 MED FILL — MELOXICAM 15 MG TABLET: 15 | 90 days supply | Qty: 90 | Fill #0

## 2015-12-12 MED FILL — VIAGRA 100 MG TABLET: 100 | 90 days supply | Qty: 18 | Fill #2

## 2016-01-03 MED FILL — ATORVASTATIN 10 MG TABLET: 10 | 90 days supply | Qty: 90 | Fill #2

## 2016-01-03 MED FILL — METFORMIN HCL ER 500 MG TAB: 500 | 90 days supply | Qty: 90 | Fill #1

## 2016-01-03 MED FILL — LISINOPRIL 10 MG TABLET: 10 | 90 days supply | Qty: 90 | Fill #2

## 2016-01-03 MED FILL — FUROSEMIDE 20 MG TABLET: 20 | 90 days supply | Qty: 90 | Fill #2

## 2016-01-03 MED FILL — AMLODIPINE BESYLATE 10 MG T: 10 | 90 days supply | Qty: 90 | Fill #2

## 2016-01-03 MED FILL — ESOMEPRAZOLE MAG DR 40 MG C: 40 | 90 days supply | Qty: 90 | Fill #1

## 2016-01-29 NOTE — Progress Notes (Signed)
Erroneous encounter

## 2016-01-31 ENCOUNTER — Encounter (HOSPITAL_COMMUNITY): Payer: Self-pay | Admitting: Emergency Medicine

## 2016-01-31 ENCOUNTER — Inpatient Hospital Stay (HOSPITAL_COMMUNITY)
Admission: EM | Admit: 2016-01-31 | Discharge: 2016-02-07 | DRG: 419 | Disposition: A | Discharge status: Home / Self Care | Payer: 59 | Attending: Internal Medicine | Admitting: Internal Medicine

## 2016-01-31 DIAGNOSIS — E785 Hyperlipidemia, unspecified: Secondary | ICD-10-CM

## 2016-01-31 DIAGNOSIS — Z7982 Long term (current) use of aspirin: Secondary | ICD-10-CM

## 2016-01-31 DIAGNOSIS — E119 Type 2 diabetes mellitus without complications: Secondary | ICD-10-CM | POA: Diagnosis not present

## 2016-01-31 DIAGNOSIS — Z7984 Long term (current) use of oral hypoglycemic drugs: Secondary | ICD-10-CM

## 2016-01-31 DIAGNOSIS — R945 Abnormal results of liver function studies: Secondary | ICD-10-CM | POA: Diagnosis not present

## 2016-01-31 DIAGNOSIS — R1013 Epigastric pain: Secondary | ICD-10-CM | POA: Diagnosis not present

## 2016-01-31 DIAGNOSIS — R7989 Other specified abnormal findings of blood chemistry: Secondary | ICD-10-CM | POA: Diagnosis present

## 2016-01-31 DIAGNOSIS — E118 Type 2 diabetes mellitus with unspecified complications: Secondary | ICD-10-CM

## 2016-01-31 DIAGNOSIS — K660 Peritoneal adhesions (postprocedural) (postinfection): Secondary | ICD-10-CM | POA: Diagnosis present

## 2016-01-31 DIAGNOSIS — Z79899 Other long term (current) drug therapy: Secondary | ICD-10-CM

## 2016-01-31 DIAGNOSIS — R748 Abnormal levels of other serum enzymes: Secondary | ICD-10-CM

## 2016-01-31 DIAGNOSIS — R17 Unspecified jaundice: Secondary | ICD-10-CM

## 2016-01-31 DIAGNOSIS — K8062 Calculus of gallbladder and bile duct with acute cholecystitis without obstruction: Secondary | ICD-10-CM | POA: Diagnosis not present

## 2016-01-31 DIAGNOSIS — R932 Abnormal findings on diagnostic imaging of liver and biliary tract: Secondary | ICD-10-CM

## 2016-01-31 DIAGNOSIS — R9389 Abnormal findings on diagnostic imaging of other specified body structures: Secondary | ICD-10-CM

## 2016-01-31 DIAGNOSIS — R1011 Right upper quadrant pain: Secondary | ICD-10-CM | POA: Diagnosis not present

## 2016-01-31 DIAGNOSIS — K810 Acute cholecystitis: Secondary | ICD-10-CM

## 2016-01-31 DIAGNOSIS — I10 Essential (primary) hypertension: Secondary | ICD-10-CM

## 2016-01-31 DIAGNOSIS — K5900 Constipation, unspecified: Secondary | ICD-10-CM | POA: Diagnosis not present

## 2016-01-31 DIAGNOSIS — R509 Fever, unspecified: Secondary | ICD-10-CM | POA: Diagnosis not present

## 2016-01-31 DIAGNOSIS — Z8249 Family history of ischemic heart disease and other diseases of the circulatory system: Secondary | ICD-10-CM | POA: Diagnosis not present

## 2016-01-31 DIAGNOSIS — K8020 Calculus of gallbladder without cholecystitis without obstruction: Secondary | ICD-10-CM

## 2016-01-31 DIAGNOSIS — K838 Other specified diseases of biliary tract: Secondary | ICD-10-CM | POA: Diagnosis not present

## 2016-01-31 DIAGNOSIS — R109 Unspecified abdominal pain: Secondary | ICD-10-CM

## 2016-01-31 NOTE — ED Triage Notes (Signed)
Pt states he woke up this morning with pains across the upper part of his abdomen  Pt states he took some pepto bismol and it got better for a while but is worse tonight  Pt states he has had several loose stools today that are green in color   Pt denies N/V

## 2016-02-01 ENCOUNTER — Inpatient Hospital Stay (HOSPITAL_COMMUNITY): Payer: 59

## 2016-02-01 ENCOUNTER — Emergency Department (HOSPITAL_COMMUNITY): Payer: 59

## 2016-02-01 ENCOUNTER — Encounter (HOSPITAL_COMMUNITY): Payer: Self-pay | Admitting: Internal Medicine

## 2016-02-01 DIAGNOSIS — R7989 Other specified abnormal findings of blood chemistry: Secondary | ICD-10-CM | POA: Diagnosis not present

## 2016-02-01 DIAGNOSIS — R1013 Epigastric pain: Secondary | ICD-10-CM | POA: Diagnosis not present

## 2016-02-01 DIAGNOSIS — K810 Acute cholecystitis: Secondary | ICD-10-CM | POA: Diagnosis not present

## 2016-02-01 DIAGNOSIS — K8012 Calculus of gallbladder with acute and chronic cholecystitis without obstruction: Secondary | ICD-10-CM | POA: Diagnosis not present

## 2016-02-01 DIAGNOSIS — K5900 Constipation, unspecified: Secondary | ICD-10-CM | POA: Diagnosis present

## 2016-02-01 DIAGNOSIS — E119 Type 2 diabetes mellitus without complications: Secondary | ICD-10-CM | POA: Diagnosis not present

## 2016-02-01 DIAGNOSIS — R509 Fever, unspecified: Secondary | ICD-10-CM | POA: Diagnosis present

## 2016-02-01 DIAGNOSIS — K819 Cholecystitis, unspecified: Secondary | ICD-10-CM | POA: Diagnosis not present

## 2016-02-01 DIAGNOSIS — R935 Abnormal findings on diagnostic imaging of other abdominal regions, including retroperitoneum: Secondary | ICD-10-CM | POA: Diagnosis not present

## 2016-02-01 DIAGNOSIS — Z7984 Long term (current) use of oral hypoglycemic drugs: Secondary | ICD-10-CM | POA: Diagnosis not present

## 2016-02-01 DIAGNOSIS — R932 Abnormal findings on diagnostic imaging of liver and biliary tract: Secondary | ICD-10-CM | POA: Diagnosis not present

## 2016-02-01 DIAGNOSIS — R109 Unspecified abdominal pain: Secondary | ICD-10-CM | POA: Diagnosis present

## 2016-02-01 DIAGNOSIS — E785 Hyperlipidemia, unspecified: Secondary | ICD-10-CM | POA: Diagnosis present

## 2016-02-01 DIAGNOSIS — Z79899 Other long term (current) drug therapy: Secondary | ICD-10-CM | POA: Diagnosis not present

## 2016-02-01 DIAGNOSIS — I10 Essential (primary) hypertension: Secondary | ICD-10-CM | POA: Diagnosis not present

## 2016-02-01 DIAGNOSIS — K8066 Calculus of gallbladder and bile duct with acute and chronic cholecystitis without obstruction: Secondary | ICD-10-CM | POA: Diagnosis not present

## 2016-02-01 DIAGNOSIS — R748 Abnormal levels of other serum enzymes: Secondary | ICD-10-CM | POA: Diagnosis not present

## 2016-02-01 DIAGNOSIS — K8062 Calculus of gallbladder and bile duct with acute cholecystitis without obstruction: Secondary | ICD-10-CM | POA: Diagnosis present

## 2016-02-01 DIAGNOSIS — K8020 Calculus of gallbladder without cholecystitis without obstruction: Secondary | ICD-10-CM | POA: Diagnosis not present

## 2016-02-01 DIAGNOSIS — Z8249 Family history of ischemic heart disease and other diseases of the circulatory system: Secondary | ICD-10-CM | POA: Diagnosis not present

## 2016-02-01 DIAGNOSIS — Z7982 Long term (current) use of aspirin: Secondary | ICD-10-CM | POA: Diagnosis not present

## 2016-02-01 DIAGNOSIS — R1011 Right upper quadrant pain: Secondary | ICD-10-CM | POA: Diagnosis not present

## 2016-02-01 DIAGNOSIS — R945 Abnormal results of liver function studies: Secondary | ICD-10-CM

## 2016-02-01 DIAGNOSIS — K660 Peritoneal adhesions (postprocedural) (postinfection): Secondary | ICD-10-CM | POA: Diagnosis present

## 2016-02-01 DIAGNOSIS — K838 Other specified diseases of biliary tract: Secondary | ICD-10-CM | POA: Diagnosis not present

## 2016-02-01 DIAGNOSIS — K8050 Calculus of bile duct without cholangitis or cholecystitis without obstruction: Secondary | ICD-10-CM | POA: Diagnosis not present

## 2016-02-01 DIAGNOSIS — R17 Unspecified jaundice: Secondary | ICD-10-CM | POA: Diagnosis not present

## 2016-02-01 LAB — CBC
HCT: 41.7 % (ref 39.0–52.0)
Hemoglobin: 13.9 g/dL (ref 13.0–17.0)
MCH: 30.8 pg (ref 26.0–34.0)
MCHC: 33.3 g/dL (ref 30.0–36.0)
MCV: 92.5 fL (ref 78.0–100.0)
PLATELETS: 260 10*3/uL (ref 150–400)
RBC: 4.51 MIL/uL (ref 4.22–5.81)
RDW: 13.2 % (ref 11.5–15.5)
WBC: 5.5 10*3/uL (ref 4.0–10.5)

## 2016-02-01 LAB — HEPATIC FUNCTION PANEL
ALK PHOS: 163 U/L — AB (ref 38–126)
ALT: 260 U/L — AB (ref 17–63)
AST: 177 U/L — AB (ref 15–41)
Albumin: 3.9 g/dL (ref 3.5–5.0)
BILIRUBIN DIRECT: 0.7 mg/dL — AB (ref 0.1–0.5)
BILIRUBIN TOTAL: 2.2 mg/dL — AB (ref 0.3–1.2)
Indirect Bilirubin: 1.5 mg/dL — ABNORMAL HIGH (ref 0.3–0.9)
Total Protein: 7.2 g/dL (ref 6.5–8.1)

## 2016-02-01 LAB — CBC WITH DIFFERENTIAL/PLATELET
BASOS PCT: 0 %
Basophils Absolute: 0 10*3/uL (ref 0.0–0.1)
EOS ABS: 0 10*3/uL (ref 0.0–0.7)
Eosinophils Relative: 0 %
HEMATOCRIT: 39.7 % (ref 39.0–52.0)
Hemoglobin: 13.3 g/dL (ref 13.0–17.0)
Lymphocytes Relative: 14 %
Lymphs Abs: 0.9 10*3/uL (ref 0.7–4.0)
MCH: 30.9 pg (ref 26.0–34.0)
MCHC: 33.5 g/dL (ref 30.0–36.0)
MCV: 92.1 fL (ref 78.0–100.0)
MONO ABS: 0.4 10*3/uL (ref 0.1–1.0)
MONOS PCT: 6 %
NEUTROS ABS: 5.2 10*3/uL (ref 1.7–7.7)
Neutrophils Relative %: 80 %
Platelets: 251 10*3/uL (ref 150–400)
RBC: 4.31 MIL/uL (ref 4.22–5.81)
RDW: 13.1 % (ref 11.5–15.5)
WBC: 6.5 10*3/uL (ref 4.0–10.5)

## 2016-02-01 LAB — BASIC METABOLIC PANEL
Anion gap: 6 (ref 5–15)
BUN: 10 mg/dL (ref 6–20)
CALCIUM: 8.5 mg/dL — AB (ref 8.9–10.3)
CO2: 24 mmol/L (ref 22–32)
CREATININE: 0.84 mg/dL (ref 0.61–1.24)
Chloride: 106 mmol/L (ref 101–111)
GFR calc non Af Amer: >60 mL/min (ref >60)
Glucose, Bld: 126 mg/dL — ABNORMAL HIGH (ref 65–99)
Potassium: 4 mmol/L (ref 3.5–5.1)
Sodium: 136 mmol/L (ref 135–145)

## 2016-02-01 LAB — COMPREHENSIVE METABOLIC PANEL
ALT: 295 U/L — AB (ref 17–63)
AST: 278 U/L — AB (ref 15–41)
Albumin: 4.1 g/dL (ref 3.5–5.0)
Alkaline Phosphatase: 165 U/L — ABNORMAL HIGH (ref 38–126)
Anion gap: 6 (ref 5–15)
BILIRUBIN TOTAL: 3.3 mg/dL — AB (ref 0.3–1.2)
BUN: 11 mg/dL (ref 6–20)
CHLORIDE: 105 mmol/L (ref 101–111)
CO2: 27 mmol/L (ref 22–32)
CREATININE: 0.86 mg/dL (ref 0.61–1.24)
Calcium: 9 mg/dL (ref 8.9–10.3)
GFR calc Af Amer: >60 mL/min (ref >60)
GLUCOSE: 143 mg/dL — AB (ref 65–99)
Potassium: 3.6 mmol/L (ref 3.5–5.1)
Sodium: 138 mmol/L (ref 135–145)
Total Protein: 7.8 g/dL (ref 6.5–8.1)

## 2016-02-01 LAB — URINALYSIS, ROUTINE W REFLEX MICROSCOPIC
BILIRUBIN URINE: NEGATIVE
GLUCOSE, UA: NEGATIVE mg/dL
HGB URINE DIPSTICK: NEGATIVE
KETONES UR: NEGATIVE mg/dL
LEUKOCYTES UA: NEGATIVE
Nitrite: NEGATIVE
PROTEIN: NEGATIVE mg/dL
Specific Gravity, Urine: 1.039 — ABNORMAL HIGH (ref 1.005–1.030)
pH: 7 (ref 5.0–8.0)

## 2016-02-01 LAB — LIPASE, BLOOD
LIPASE: 18 U/L (ref 11–51)
Lipase: 21 U/L (ref 11–51)

## 2016-02-01 LAB — GLUCOSE, CAPILLARY
GLUCOSE-CAPILLARY: 101 mg/dL — AB (ref 65–99)
GLUCOSE-CAPILLARY: 103 mg/dL — AB (ref 65–99)
Glucose-Capillary: 103 mg/dL — ABNORMAL HIGH (ref 65–99)
Glucose-Capillary: 120 mg/dL — ABNORMAL HIGH (ref 65–99)

## 2016-02-01 LAB — DIFFERENTIAL
BASOS PCT: 0 %
Basophils Absolute: 0 10*3/uL (ref 0.0–0.1)
EOS ABS: 0.1 10*3/uL (ref 0.0–0.7)
EOS PCT: 2 %
Lymphocytes Relative: 31 %
Lymphs Abs: 1.7 10*3/uL (ref 0.7–4.0)
MONO ABS: 0.5 10*3/uL (ref 0.1–1.0)
MONOS PCT: 9 %
NEUTROS ABS: 3.1 10*3/uL (ref 1.7–7.7)
Neutrophils Relative %: 58 %

## 2016-02-01 MED ORDER — LORAZEPAM 2 MG/ML IJ SOLN
1.0000 mg | INTRAMUSCULAR | Status: AC
  Administered 2016-02-01: 1 mg via INTRAVENOUS

## 2016-02-01 MED ORDER — MORPHINE SULFATE (PF) 4 MG/ML IV SOLN
4.0000 mg | Freq: Once | INTRAVENOUS | Status: AC
  Administered 2016-02-01: 4 mg via INTRAVENOUS
  Filled 2016-02-01: qty 1

## 2016-02-01 MED ORDER — ONDANSETRON HCL 4 MG/2ML IJ SOLN
4.0000 mg | Freq: Four times a day (QID) | INTRAMUSCULAR | Status: DC | PRN
  Administered 2016-02-03: 4 mg via INTRAVENOUS
  Filled 2016-02-01: qty 2

## 2016-02-01 MED ORDER — INSULIN ASPART 100 UNIT/ML ~~LOC~~ SOLN: 0.0000 [IU] | SUBCUTANEOUS | Status: DC

## 2016-02-01 MED ORDER — MORPHINE SULFATE (PF) 4 MG/ML IV SOLN
4.0000 mg | INTRAVENOUS | Status: DC | PRN
  Administered 2016-02-01 – 2016-02-07 (×26): 4 mg via INTRAVENOUS
  Filled 2016-02-01 (×27): qty 1

## 2016-02-01 MED ORDER — ONDANSETRON HCL 4 MG/2ML IJ SOLN: 4.0000 mg | Freq: Three times a day (TID) | INTRAMUSCULAR | Status: AC | PRN

## 2016-02-01 MED ORDER — GADOBENATE DIMEGLUMINE 529 MG/ML IV SOLN
20.0000 mL | Freq: Once | INTRAVENOUS | Status: AC | PRN
  Administered 2016-02-01: 20 mL via INTRAVENOUS

## 2016-02-01 MED ORDER — FENTANYL CITRATE (PF) 100 MCG/2ML IJ SOLN
50.0000 ug | INTRAMUSCULAR | Status: DC | PRN
  Administered 2016-02-01: 50 ug via INTRAVENOUS
  Filled 2016-02-01: qty 2

## 2016-02-01 MED ORDER — SODIUM CHLORIDE 0.9 % IV SOLN
INTRAVENOUS | Status: DC
  Administered 2016-02-01 – 2016-02-03 (×2): via INTRAVENOUS

## 2016-02-01 MED ORDER — MORPHINE SULFATE (PF) 2 MG/ML IV SOLN: 2.0000 mg | INTRAVENOUS | Status: DC | PRN

## 2016-02-01 MED ORDER — PIPERACILLIN-TAZOBACTAM 3.375 G IVPB
3.3750 g | Freq: Three times a day (TID) | INTRAVENOUS | Status: DC
  Administered 2016-02-01 – 2016-02-07 (×18): 3.375 g via INTRAVENOUS
  Filled 2016-02-01 (×19): qty 50

## 2016-02-01 MED ORDER — LORAZEPAM 2 MG/ML IJ SOLN
INTRAMUSCULAR | Status: AC
Start: 2016-02-01 — End: 2016-02-02
  Filled 2016-02-01: qty 1

## 2016-02-01 MED ORDER — TETRACAINE HCL 0.5 % OP SOLN: 2.0000 [drp] | Freq: Once | OPHTHALMIC | Status: DC

## 2016-02-01 MED ORDER — HYDRALAZINE HCL 20 MG/ML IJ SOLN
10.0000 mg | INTRAMUSCULAR | Status: DC | PRN
  Administered 2016-02-04 – 2016-02-05 (×3): 10 mg via INTRAVENOUS
  Filled 2016-02-01 (×3): qty 1

## 2016-02-01 MED ORDER — ACETAMINOPHEN 325 MG PO TABS
650.0000 mg | ORAL_TABLET | Freq: Four times a day (QID) | ORAL | Status: DC | PRN
  Administered 2016-02-03: 650 mg via ORAL
  Filled 2016-02-01: qty 2

## 2016-02-01 MED ORDER — PIPERACILLIN-TAZOBACTAM 3.375 G IVPB 30 MIN
3.3750 g | Freq: Once | INTRAVENOUS | Status: AC
  Administered 2016-02-01: 3.375 g via INTRAVENOUS
  Filled 2016-02-01: qty 50

## 2016-02-01 MED ORDER — ACETAMINOPHEN 650 MG RE SUPP: 650.0000 mg | Freq: Four times a day (QID) | RECTAL | Status: DC | PRN

## 2016-02-01 MED ORDER — ONDANSETRON HCL 4 MG/2ML IJ SOLN
4.0000 mg | Freq: Once | INTRAMUSCULAR | Status: AC
  Administered 2016-02-01: 4 mg via INTRAVENOUS
  Filled 2016-02-01: qty 2

## 2016-02-01 MED ORDER — ONDANSETRON HCL 4 MG PO TABS: 4.0000 mg | ORAL_TABLET | Freq: Four times a day (QID) | ORAL | Status: DC | PRN

## 2016-02-01 MED ORDER — INSULIN ASPART 100 UNIT/ML ~~LOC~~ SOLN
0.0000 [IU] | SUBCUTANEOUS | Status: DC
Start: 2016-02-01 — End: 2016-02-07
  Administered 2016-02-02 (×2): 1 [IU] via SUBCUTANEOUS
  Administered 2016-02-02: 2 [IU] via SUBCUTANEOUS
  Administered 2016-02-02 – 2016-02-05 (×4): 1 [IU] via SUBCUTANEOUS
  Administered 2016-02-05: 2 [IU] via SUBCUTANEOUS
  Administered 2016-02-06 (×3): 1 [IU] via SUBCUTANEOUS

## 2016-02-01 NOTE — ED Notes (Signed)
Pt aware of the need for a urine sample. 

## 2016-02-01 NOTE — ED Provider Notes (Signed)
[facility] DEPT Provider Note   CSN: [id number] Arrival date & time: 01/31/16  2315  First Provider Contact:  First MD Initiated Contact with Patient 02/01/16 0117     By signing my name below, I, Higinio Plan, attest that this documentation has been prepared under the direction and in the presence of Delora Fuel, MD. Electronically Signed: Higinio Plan, Scribe. 02/01/2016. 1:42 AM.  History   Chief Complaint Chief Complaint  Patient presents with  . Abdominal Pain   HPI Comments: [name] [name] is a 65 y.o. male with PMHx of HTN, GERD and DM, who presents to the Emergency Department complaining of sudden onset, gradually worsening, 7/10, RUQ abdominal pain that began this morning and worsened this evening. Pt describes his abdominal pain as "squeezing and burning" but states it does not radiate to another part of his body. He notes associated nausea and loose stools that have been green in color but denies vomiting. He also states he has experienced burning in his throat; though, he states this could be due to symptoms of GERD. He notes his pain was improved after having a BM but returned soon after.   The history is provided by the patient. No language interpreter was used.   Past Medical History:  Diagnosis Date  . Arthritis   . Diabetes mellitus without complication ([place])   . Hypertension    Patient Active Problem List   Diagnosis Date Noted  . Metabolic syndrome [id number]  . Hyperlipidemia 09/24/2014  . History of gout 07/04/2013  . Low serum testosterone level 07/04/2013  . GE reflux 07/06/2012  . Type 2 diabetes mellitus ([place]) 03/09/2012  . History of esophageal stricture 03/09/2012  . Osteoarthritis of both knees 03/09/2012  . Obesity 03/09/2012  . Erectile dysfunction 03/02/2012  . Hypertension 03/02/2012   Past Surgical History:  Procedure Laterality Date  . SPINE SURGERY      Home Medications    Prior to Admission medications   Medication Sig Start Date  End Date Taking? Authorizing Provider  amLODipine (NORVASC) 10 MG tablet Take 10 mg by mouth daily.   Yes Historical Provider, MD  aspirin-sod bicarb-citric acid (ALKA-SELTZER) 325 MG TBEF tablet Take 325 mg by mouth every 6 (six) hours as needed (pain).   Yes Historical Provider, MD  atorvastatin (LIPITOR) 10 MG tablet Take 1 tablet (10 mg total) by mouth daily. 05/19/15  Yes Elby Showers, MD  bismuth subsalicylate (PEPTO BISMOL) 262 MG/15ML suspension Take 30 mLs by mouth every 6 (six) hours as needed for indigestion.   Yes Historical Provider, MD  esomeprazole (NEXIUM) 40 MG capsule TAKE 1 CAPSULE BY MOUTH ONCE DAILY 10/06/15  Yes Elby Showers, MD  ibuprofen (ADVIL,MOTRIN) 200 MG tablet Take 400 mg by mouth every 6 (six) hours as needed for moderate pain.   Yes Historical Provider, MD  lisinopril (PRINIVIL,ZESTRIL) 10 MG tablet TAKE 1 TABLET BY MOUTH DAILY. 07/07/15  Yes Elby Showers, MD  meloxicam (MOBIC) 15 MG tablet Take 1 tablet (15 mg total) by mouth daily. 12/12/15  Yes Elby Showers, MD  metFORMIN (GLUCOPHAGE-XR) 500 MG 24 hr tablet TAKE 1 TABLET BY MOUTH DAILY WITH BREAKFAST. 10/06/15  Yes Elby Showers, MD  furosemide (LASIX) 40 MG tablet Take 1 tablet (40 mg total) by mouth daily. Patient not taking: Reported on 02/01/2016 05/19/15   Elby Showers, MD  VIAGRA 100 MG tablet TAKE 1 TABLET BY MOUTH ONCE DAILY AS NEEDED 06/06/15   Elby Showers, MD  Family History Family History  Problem Relation Age of Onset  . Stroke Mother   . Hypertension Mother     Social History Social History  Substance Use Topics  . Smoking status: Never Smoker  . Smokeless tobacco: Never Used  . Alcohol use No   Allergies   Review of patient's allergies indicates no known allergies.  Review of Systems Review of Systems  HENT: Positive for sore throat.   Gastrointestinal: Positive for abdominal pain, diarrhea and nausea. Negative for vomiting.   Physical Exam Updated Vital Signs BP 147/68 (BP  Location: Right Arm)   Pulse 77   Resp 22   Ht 5\' 10"  (1.778 m)   Wt 270 lb (122.5 kg)   SpO2 99%   BMI 38.74 kg/m   Physical Exam  Constitutional: He is oriented to person, place, and time. He appears well-developed and well-nourished.  Appears uncomfortable  HENT:  Head: Normocephalic and atraumatic.  Eyes: Conjunctivae and EOM are normal. Pupils are equal, round, and reactive to light. Right eye exhibits no discharge. Left eye exhibits no discharge. No scleral icterus.  Neck: Normal range of motion. Neck supple. No JVD present.  Cardiovascular: Normal rate, regular rhythm and normal heart sounds.   No murmur heard. Pulmonary/Chest: Effort normal and breath sounds normal. He has no wheezes. He has no rales. He exhibits no tenderness.  Abdominal: Soft. He exhibits no mass. There is tenderness. There is no rebound and no guarding.  Mild to moderate RUQ tenderness, no rebound or guarding +- Murphy's sign  Bowel sounds decreased   Musculoskeletal: Normal range of motion. He exhibits no edema.  Lymphadenopathy:    He has no cervical adenopathy.  Neurological: He is alert and oriented to person, place, and time. No cranial nerve deficit. He exhibits normal muscle tone. Coordination normal.  Skin: Skin is warm.  Psychiatric: He has a normal mood and affect. His behavior is normal. Judgment and thought content normal.  Nursing note and vitals reviewed.  ED Treatments / Results  Labs (all labs ordered are listed, but only abnormal results are displayed) Labs Reviewed  COMPREHENSIVE METABOLIC PANEL - Abnormal; Notable for the following:       Result Value   Glucose, Bld 143 (*)    AST 278 (*)    ALT 295 (*)    Alkaline Phosphatase 165 (*)    Total Bilirubin 3.3 (*)    All other components within normal limits  LIPASE, BLOOD  CBC  DIFFERENTIAL  URINALYSIS, ROUTINE W REFLEX MICROSCOPIC (NOT AT [facility])   Radiology US Abdomen Complete  Result Date: 02/01/2016 CLINICAL DATA:   Sudden onset of right upper quadrant abdominal pain and nausea. History of hypertension, diabetes and GERD. EXAM: ABDOMEN ULTRASOUND COMPLETE COMPARISON:  None. FINDINGS: Gallbladder: There are several small gallstones an layering echogenic sludge within an otherwise normal-appearing gallbladder. Dominant echogenic gallstone within the neck of the gallbladder measures approximately 0.9 cm in diameter (image 3). No definitive gallbladder wall thickening or pericholecystic fluid. Common bile duct: Diameter: Normal in size measuring 3 mm in diameter Liver: There is mild diffuse slightly increased and coarsened echogenicity of the hepatic parenchyma. No discrete hepatic lesions. No definite evidence of intrahepatic biliary ductal dilatation. No ascites. IVC: No abnormality visualized. Pancreas: Query mild dilatation of the pancreatic duct measuring approximately 3 mm in diameter (image 54), the etiology of which is not depicted on this examination. Spleen: Normal in size measuring 5.4 cm in length Right Kidney: Normal cortical thickness, echogenicity and  size, measuring 12.4 cm in length. No focal renal lesions. No echogenic renal stones. No urinary obstruction. Left Kidney: Normal cortical thickness, echogenicity and size, measuring 12.7 cm in length. No focal renal lesions. No echogenic renal stones. No urinary obstruction. Abdominal aorta: Only seen proximally, however the proximal aorta is of normal caliber. The mid and distal aspects of the abdominal aorta obscured by bowel gas. Other findings: None. IMPRESSION: 1. Cholelithiasis without evidence of cholecystitis. If clinical concern persists for acute cholecystitis, further evaluation could be performed with nuclear medicine HIDA scan as clinically indicated. 2. Potential mild dilatation of the pancreatic duct measuring approximately 3 mm in diameter, the etiology of which is not depicted on this examination. Further evaluation with nonemergent MRCP could be  performed as clinically indicated. 3. Suspected hepatic steatosis. Electronically Signed   By: Sandi Mariscal M.D.   On: 02/01/2016 02:37   Procedures Procedures  DIAGNOSTIC STUDIES:  Oxygen Saturation is 99% on RA, normal by my interpretation.    COORDINATION OF CARE:  1:34 AM Discussed treatment plan, which includes [place] of abdomen with pt at bedside and pt agreed to plan.   Medications Ordered in ED Medications  fentaNYL (SUBLIMAZE) injection 50 mcg (50 mcg Intravenous Given 02/01/16 0126)  morphine 4 MG/ML injection 4 mg (not administered)  ondansetron (ZOFRAN) injection 4 mg (4 mg Intravenous Given 02/01/16 0125)  morphine 4 MG/ML injection 4 mg (4 mg Intravenous Given 02/01/16 0157)   Initial Impression / Assessment and Plan / ED Course  I have reviewed the triage vital signs and the nursing notes.  Pertinent labs & imaging results that were available during my care of the patient were reviewed by me and considered in my medical decision making (see chart for details).  Clinical Course  Comment By Time  Mild hyperglycemia, moderate transaminitis Delora Fuel, MD [id number]  Elevated alkaline phosphatase and bilirubin suggestive of biliary obstruction Delora Fuel, MD [id number]   Abdominal pain with right upper quadrant tenderness. Laboratory evaluation shows elevation of transaminases and alkaline phosphatase with bilirubin of over 3. This is worrisome for cholelithiasis and possible choledocholithiasis. He is had transient pain relief with fentanyl and is given morphine for pain control and ondansetron for nausea. Ultrasound does show cholelithiasis and dilated pancreatic duct. Lipase was normal. He will need to be admitted for ERCP and surgical consultation. Case is discussed with Dr. Hal Hope of [facility] who agrees to admit the patient.  Final Clinical Impressions(s) / ED Diagnoses   Final diagnoses:  Calculus of gallbladder without cholecystitis without obstruction    Elevated liver enzymes  Jaundice    New Prescriptions New Prescriptions   No medications on file   I personally performed the services described in this documentation, which was scribed in my presence. The recorded information has been reviewed and is accurate.     Delora Fuel, MD [id number] [id number]

## 2016-02-01 NOTE — Progress Notes (Signed)
PHARMACY NOTE -  [facility] has been assisting with dosing of Zosyn for r/o acute cholecystitis.  Dosage remains stable at 3.375 g IV q8 hrs by 4-hr infusion and need for further dosage adjustment appears unlikely at present.    Will sign off at this time.  Please reconsult if a change in clinical status warrants re-evaluation of dosage.  Reuel Boom, PharmD, BCPS Pager: [phone] 02/01/2016, 10:30 AM

## 2016-02-01 NOTE — H&P (Signed)
History and Physical    [name] [name] [id number] DOB: 06/01/1951 DOA: 01/31/2016  PCP: Elby Showers, MD  Patient coming from: Home.  Chief Complaint: Abdominal pain.  HPI: [name] [name] is a 65 y.o. male with history of diabetes mellitus type 2, hypertension, hyperlipidemia presents to the ER because of persistent abdominal pain since yesterday morning. Patient's pain is mostly in the epigastric area. Denies any radiation. Denies any associated shortness of breath or chest pain. Denies any nausea vomiting but has been having at least 5 episodes of loose stools. In the ER patient's labs show elevated LFTs with sonogram showing gallstones and dilated pancreatic duct. Patient denies any fever or chills. Patient is being admitted for further management of abdominal pain with gallstones and possible obstruction in the pancreatic duct.   ED Course: Was given pain relief medications. Has sonogram as discussed above. With labs showing elevated LFTs.  Review of Systems: As per HPI, rest all negative.   Past Medical History:  Diagnosis Date  . Arthritis   . Diabetes mellitus without complication ([place])   . Hypertension     Past Surgical History:  Procedure Laterality Date  . SPINE SURGERY       reports that he has never smoked. He has never used smokeless tobacco. He reports that he does not drink alcohol or use drugs.  No Known Allergies  Family History  Problem Relation Age of Onset  . Stroke Mother   . Hypertension Mother     Prior to Admission medications   Medication Sig Start Date End Date Taking? Authorizing Provider  amLODipine (NORVASC) 10 MG tablet Take 10 mg by mouth daily.   Yes Historical Provider, MD  aspirin-sod bicarb-citric acid (ALKA-SELTZER) 325 MG TBEF tablet Take 325 mg by mouth every 6 (six) hours as needed (pain).   Yes Historical Provider, MD  atorvastatin (LIPITOR) 10 MG tablet Take 1 tablet (10 mg total) by mouth daily. 05/19/15  Yes Elby Showers, MD  bismuth subsalicylate (PEPTO BISMOL) 262 MG/15ML suspension Take 30 mLs by mouth every 6 (six) hours as needed for indigestion.   Yes Historical Provider, MD  esomeprazole (NEXIUM) 40 MG capsule TAKE 1 CAPSULE BY MOUTH ONCE DAILY 10/06/15  Yes Elby Showers, MD  ibuprofen (ADVIL,MOTRIN) 200 MG tablet Take 400 mg by mouth every 6 (six) hours as needed for moderate pain.   Yes Historical Provider, MD  lisinopril (PRINIVIL,ZESTRIL) 10 MG tablet TAKE 1 TABLET BY MOUTH DAILY. 07/07/15  Yes Elby Showers, MD  meloxicam (MOBIC) 15 MG tablet Take 1 tablet (15 mg total) by mouth daily. 12/12/15  Yes Elby Showers, MD  metFORMIN (GLUCOPHAGE-XR) 500 MG 24 hr tablet TAKE 1 TABLET BY MOUTH DAILY WITH BREAKFAST. 10/06/15  Yes Elby Showers, MD  furosemide (LASIX) 40 MG tablet Take 1 tablet (40 mg total) by mouth daily. Patient not taking: Reported on 02/01/2016 05/19/15   Elby Showers, MD  VIAGRA 100 MG tablet TAKE 1 TABLET BY MOUTH ONCE DAILY AS NEEDED 06/06/15   Elby Showers, MD    Physical Exam: Vitals:   02/01/16 0218 02/01/16 0330 02/01/16 0400 02/01/16 0430  BP: 149/83 142/93 142/80 (!) 166/86  Pulse: 66 68 68 69  Resp: 26 16    Temp:    98.4 F (36.9 C)  TempSrc:    Oral  SpO2: 92% 96% 92% 96%  Weight:      Height:  Constitutional: Not in distress. Vitals:   02/01/16 0218 02/01/16 0330 02/01/16 0400 02/01/16 0430  BP: 149/83 142/93 142/80 (!) 166/86  Pulse: 66 68 68 69  Resp: 26 16    Temp:    98.4 F (36.9 C)  TempSrc:    Oral  SpO2: 92% 96% 92% 96%  Weight:      Height:       Eyes: Anicteric no pallor. ENMT: No discharge from the ears eyes nose or mouth. Neck: No mass felt. No JVD appreciated. Respiratory: No rhonchi or crepitations. Cardiovascular: S1-S2 heard. Abdomen: Soft nontender bowel sounds present. Musculoskeletal: No edema. Skin: No rash. Neurologic: Alert awake oriented to time place and person. Moves all extremities. Psychiatric: Appears  normal.   Labs on Admission: I have personally reviewed following labs and imaging studies  CBC:  Recent Labs Lab 01/31/16 2346  WBC 5.5  NEUTROABS 3.1  HGB 13.9  HCT 41.7  MCV 92.5  PLT [id number]   Basic Metabolic Panel:  Recent Labs Lab 01/31/16 2346  NA 138  K 3.6  CL 105  CO2 27  GLUCOSE 143*  BUN 11  CREATININE 0.86  CALCIUM 9.0   GFR: Estimated Creatinine Clearance: 115.4 mL/min (by C-G formula based on SCr of 0.86 mg/dL). Liver Function Tests:  Recent Labs Lab 01/31/16 2346  AST 278*  ALT 295*  ALKPHOS 165*  BILITOT 3.3*  PROT 7.8  ALBUMIN 4.1    Recent Labs Lab 01/31/16 2346  LIPASE 18   No results for input(s): AMMONIA in the last 168 hours. Coagulation Profile: No results for input(s): INR, PROTIME in the last 168 hours. Cardiac Enzymes: No results for input(s): CKTOTAL, CKMB, CKMBINDEX, TROPONINI in the last 168 hours. BNP (last 3 results) No results for input(s): PROBNP in the last 8760 hours. HbA1C: No results for input(s): HGBA1C in the last 72 hours. CBG: No results for input(s): GLUCAP in the last 168 hours. Lipid Profile: No results for input(s): CHOL, HDL, LDLCALC, TRIG, CHOLHDL, LDLDIRECT in the last 72 hours. Thyroid Function Tests: No results for input(s): TSH, T4TOTAL, FREET4, T3FREE, THYROIDAB in the last 72 hours. Anemia Panel: No results for input(s): VITAMINB12, FOLATE, FERRITIN, TIBC, IRON, RETICCTPCT in the last 72 hours. Urine analysis:    Component Value Date/Time   BILIRUBINUR neg 02/27/2015 1021   PROTEINUR neg 02/27/2015 1021   UROBILINOGEN negative 02/27/2015 1021   NITRITE neg 02/27/2015 1021   LEUKOCYTESUR Negative 02/27/2015 1021   Sepsis Labs: @LABRCNTIP (procalcitonin:4,lacticidven:4) )No results found for this or any previous visit (from the past 240 hour(s)).   Radiological Exams on Admission: US Abdomen Complete  Result Date: 02/01/2016 CLINICAL DATA:  Sudden onset of right upper quadrant abdominal  pain and nausea. History of hypertension, diabetes and GERD. EXAM: ABDOMEN ULTRASOUND COMPLETE COMPARISON:  None. FINDINGS: Gallbladder: There are several small gallstones an layering echogenic sludge within an otherwise normal-appearing gallbladder. Dominant echogenic gallstone within the neck of the gallbladder measures approximately 0.9 cm in diameter (image 3). No definitive gallbladder wall thickening or pericholecystic fluid. Common bile duct: Diameter: Normal in size measuring 3 mm in diameter Liver: There is mild diffuse slightly increased and coarsened echogenicity of the hepatic parenchyma. No discrete hepatic lesions. No definite evidence of intrahepatic biliary ductal dilatation. No ascites. IVC: No abnormality visualized. Pancreas: Query mild dilatation of the pancreatic duct measuring approximately 3 mm in diameter (image 54), the etiology of which is not depicted on this examination. Spleen: Normal in size measuring 5.4 cm in length  Right Kidney: Normal cortical thickness, echogenicity and size, measuring 12.4 cm in length. No focal renal lesions. No echogenic renal stones. No urinary obstruction. Left Kidney: Normal cortical thickness, echogenicity and size, measuring 12.7 cm in length. No focal renal lesions. No echogenic renal stones. No urinary obstruction. Abdominal aorta: Only seen proximally, however the proximal aorta is of normal caliber. The mid and distal aspects of the abdominal aorta obscured by bowel gas. Other findings: None. IMPRESSION: 1. Cholelithiasis without evidence of cholecystitis. If clinical concern persists for acute cholecystitis, further evaluation could be performed with nuclear medicine HIDA scan as clinically indicated. 2. Potential mild dilatation of the pancreatic duct measuring approximately 3 mm in diameter, the etiology of which is not depicted on this examination. Further evaluation with nonemergent MRCP could be performed as clinically indicated. 3. Suspected  hepatic steatosis. Electronically Signed   By: Sandi Mariscal M.D.   On: 02/01/2016 02:37    Assessment/Plan Principal Problem:   Abdominal pain Active Problems:   Hypertension   Type 2 diabetes mellitus (HCC)   Calculus of gallbladder without cholecystitis without obstruction   Elevated LFTs    1. Epigastric abdominal pain with elevated LFTs and gallstones with dilated pancreatic duct - concerning for obstruction at the common bile duct versus pancreatic duct. Patient also has gallstones. I have discussed with on-call general surgeon Dr. Excell Seltzer who will be seeing patient in consult. Dr. Excell Seltzer has advised to get gastroenterologist for possible ERCP. Patient will be kept nothing by mouth in anticipation of procedure and on antibiotics. Follow LFTs. 2. Elevated LFTs - probably secondary to obstructing lesion in the CBD versus pancreatic duct. However checkup. Hepatitis panel. 3. Loose stools - has not had any recent antibiotics. Check stool studies if there is any further loose stools. 4. Hypertension - since patient is nothing by mouth I have placed patient on when necessary IV hydralazine. 5. Diabetes mellitus type 2 - on sliding scale coverage. 6. Hyperlipidemia - continue statins was LFTs become normal.   DVT prophylaxis: SCDs. Code Status: Full code.  Family Communication: Discussed with patient.  Disposition Plan: Home.  Consults called: General surgery.  Admission status: Inpatient. MedSurg. Likely stage 3-4 days.    Rise Patience MD [facility] Pager [phone].  If 7PM-7AM, please contact night-coverage [email or web address] Password [email or web address]  02/01/2016, 5:09 AM

## 2016-02-01 NOTE — Progress Notes (Signed)
Pharmacy Antibiotic Note  [name] [name] is a 65 y.o. male admitted on 01/31/2016 with intra-abdominal infection.  Pharmacy has been consulted for zosyn dosing.  Plan: Zosyn 3.375Gm IV q8h EI  Height: 5\' 10"  (177.8 cm) Weight: 270 lb (122.5 kg) IBW/kg (Calculated) : 73  Temp (24hrs), Avg:98.4 F (36.9 C), Min:98.4 F (36.9 C), Max:98.4 F (36.9 C)   Recent Labs Lab 01/31/16 2346  WBC 5.5  CREATININE 0.86    Estimated Creatinine Clearance: 115.4 mL/min (by C-G formula based on SCr of 0.86 mg/dL).    No Known Allergies  Antimicrobials this admission: 7/27 zosyn >>    >>   Dose adjustments this admission:   Microbiology results:  BCx:   UCx:    Sputum:    MRSA PCR:   Thank you for allowing pharmacy to be a part of this patient's care.  Dorrene German 02/01/2016 5:17 AM

## 2016-02-01 NOTE — Consult Note (Signed)
Reason for Consult:  Abdominal pain, gallstones, possible choledocholithiasis Referring Physician: Dr. Basilio Cairo [name] [name] is an 65 y.o. male.  HPI: He was in his normal state of health until yesterday morning at about 10:00 when he had the acute onset of sharp epigastric pain that radiated to the RUQ.  This was associated with diarrhea.  No n/v, no fever or chills.  The pain was severe enough that he presented to the ED early this AM.  He was found to have elevated LFTs and US demonstrating cholelithiasis, mild dilation of pancreatic duct, no evidence of cholecystitis.  MRCP was ordered which demonstrated mild CBD dilation and suggestion of filling defect in CBD.  Past Medical History:  Diagnosis Date  . Arthritis   . Diabetes mellitus without complication ([place])   . Hypertension     Past Surgical History:  Procedure Laterality Date  . SPINE SURGERY      Family History  Problem Relation Age of Onset  . Stroke Mother   . Hypertension Mother     Social History:  reports that he has never smoked. He has never used smokeless tobacco. He reports that he does not drink alcohol or use drugs.  Allergies: No Known Allergies  Prior to Admission medications   Medication Sig Start Date End Date Taking? Authorizing Provider  amLODipine (NORVASC) 10 MG tablet Take 10 mg by mouth daily.   Yes Historical Provider, MD  aspirin-sod bicarb-citric acid (ALKA-SELTZER) 325 MG TBEF tablet Take 325 mg by mouth every 6 (six) hours as needed (pain).   Yes Historical Provider, MD  atorvastatin (LIPITOR) 10 MG tablet Take 1 tablet (10 mg total) by mouth daily. 05/19/15  Yes Elby Showers, MD  bismuth subsalicylate (PEPTO BISMOL) 262 MG/15ML suspension Take 30 mLs by mouth every 6 (six) hours as needed for indigestion.   Yes Historical Provider, MD  esomeprazole (NEXIUM) 40 MG capsule TAKE 1 CAPSULE BY MOUTH ONCE DAILY 10/06/15  Yes Elby Showers, MD  ibuprofen (ADVIL,MOTRIN) 200 MG tablet Take 400 mg  by mouth every 6 (six) hours as needed for moderate pain.   Yes Historical Provider, MD  lisinopril (PRINIVIL,ZESTRIL) 10 MG tablet TAKE 1 TABLET BY MOUTH DAILY. 07/07/15  Yes Elby Showers, MD  meloxicam (MOBIC) 15 MG tablet Take 1 tablet (15 mg total) by mouth daily. 12/12/15  Yes Elby Showers, MD  metFORMIN (GLUCOPHAGE-XR) 500 MG 24 hr tablet TAKE 1 TABLET BY MOUTH DAILY WITH BREAKFAST. 10/06/15  Yes Elby Showers, MD  furosemide (LASIX) 40 MG tablet Take 1 tablet (40 mg total) by mouth daily. Patient not taking: Reported on 02/01/2016 05/19/15   Elby Showers, MD  VIAGRA 100 MG tablet TAKE 1 TABLET BY MOUTH ONCE DAILY AS NEEDED 06/06/15   Elby Showers, MD     Results for orders placed or performed during the hospital encounter of 01/31/16 (from the past 48 hour(s))  Lipase, blood     Status: None   Collection Time: 01/31/16 11:46 PM  Result Value Ref Range   Lipase 18 11 - 51 U/L  Comprehensive metabolic panel     Status: Abnormal   Collection Time: 01/31/16 11:46 PM  Result Value Ref Range   Sodium 138 135 - 145 mmol/L   Potassium 3.6 3.5 - 5.1 mmol/L   Chloride 105 101 - 111 mmol/L   CO2 27 22 - 32 mmol/L   Glucose, Bld 143 (H) 65 - 99 mg/dL   BUN 11  6 - 20 mg/dL   Creatinine, Ser 0.86 0.61 - 1.24 mg/dL   Calcium 9.0 8.9 - 10.3 mg/dL   Total Protein 7.8 6.5 - 8.1 g/dL   Albumin 4.1 3.5 - 5.0 g/dL   AST 278 (H) 15 - 41 U/L   ALT 295 (H) 17 - 63 U/L   Alkaline Phosphatase 165 (H) 38 - 126 U/L   Total Bilirubin 3.3 (H) 0.3 - 1.2 mg/dL   GFR calc non Af Amer >60 >60 mL/min   GFR calc Af Amer >60 >60 mL/min    Comment: (NOTE) The eGFR has been calculated using the CKD EPI equation. This calculation has not been validated in all clinical situations. eGFR's persistently <60 mL/min signify possible Chronic Kidney Disease.    Anion gap 6 5 - 15  CBC     Status: None   Collection Time: 01/31/16 11:46 PM  Result Value Ref Range   WBC 5.5 4.0 - 10.5 K/uL   RBC 4.51 4.22 - 5.81  MIL/uL   Hemoglobin 13.9 13.0 - 17.0 g/dL   HCT 41.7 39.0 - 52.0 %   MCV 92.5 78.0 - 100.0 fL   MCH 30.8 26.0 - 34.0 pg   MCHC 33.3 30.0 - 36.0 g/dL   RDW 13.2 11.5 - 15.5 %   Platelets 260 150 - 400 K/uL  Differential     Status: None   Collection Time: 01/31/16 11:46 PM  Result Value Ref Range   Neutrophils Relative % 58 %   Neutro Abs 3.1 1.7 - 7.7 K/uL   Lymphocytes Relative 31 %   Lymphs Abs 1.7 0.7 - 4.0 K/uL   Monocytes Relative 9 %   Monocytes Absolute 0.5 0.1 - 1.0 K/uL   Eosinophils Relative 2 %   Eosinophils Absolute 0.1 0.0 - 0.7 K/uL   Basophils Relative 0 %   Basophils Absolute 0.0 0.0 - 0.1 K/uL  Hepatic function panel     Status: Abnormal   Collection Time: 02/01/16  5:50 AM  Result Value Ref Range   Total Protein 7.2 6.5 - 8.1 g/dL   Albumin 3.9 3.5 - 5.0 g/dL   AST 177 (H) 15 - 41 U/L   ALT 260 (H) 17 - 63 U/L   Alkaline Phosphatase 163 (H) 38 - 126 U/L   Total Bilirubin 2.2 (H) 0.3 - 1.2 mg/dL   Bilirubin, Direct 0.7 (H) 0.1 - 0.5 mg/dL   Indirect Bilirubin 1.5 (H) 0.3 - 0.9 mg/dL  CBC with Differential/Platelet     Status: None   Collection Time: 02/01/16  5:50 AM  Result Value Ref Range   WBC 6.5 4.0 - 10.5 K/uL   RBC 4.31 4.22 - 5.81 MIL/uL   Hemoglobin 13.3 13.0 - 17.0 g/dL   HCT 39.7 39.0 - 52.0 %   MCV 92.1 78.0 - 100.0 fL   MCH 30.9 26.0 - 34.0 pg   MCHC 33.5 30.0 - 36.0 g/dL   RDW 13.1 11.5 - 15.5 %   Platelets 251 150 - 400 K/uL   Neutrophils Relative % 80 %   Neutro Abs 5.2 1.7 - 7.7 K/uL   Lymphocytes Relative 14 %   Lymphs Abs 0.9 0.7 - 4.0 K/uL   Monocytes Relative 6 %   Monocytes Absolute 0.4 0.1 - 1.0 K/uL   Eosinophils Relative 0 %   Eosinophils Absolute 0.0 0.0 - 0.7 K/uL   Basophils Relative 0 %   Basophils Absolute 0.0 0.0 - 0.1 K/uL  Basic metabolic panel  Status: Abnormal   Collection Time: 02/01/16  5:50 AM  Result Value Ref Range   Sodium 136 135 - 145 mmol/L   Potassium 4.0 3.5 - 5.1 mmol/L   Chloride 106 101 -  111 mmol/L   CO2 24 22 - 32 mmol/L   Glucose, Bld 126 (H) 65 - 99 mg/dL   BUN 10 6 - 20 mg/dL   Creatinine, Ser 0.84 0.61 - 1.24 mg/dL   Calcium 8.5 (L) 8.9 - 10.3 mg/dL   GFR calc non Af Amer >60 >60 mL/min   GFR calc Af Amer >60 >60 mL/min    Comment: (NOTE) The eGFR has been calculated using the CKD EPI equation. This calculation has not been validated in all clinical situations. eGFR's persistently <60 mL/min signify possible Chronic Kidney Disease.    Anion gap 6 5 - 15  Lipase, blood     Status: None   Collection Time: 02/01/16  5:50 AM  Result Value Ref Range   Lipase 21 11 - 51 U/L  Glucose, capillary     Status: Abnormal   Collection Time: 02/01/16  8:06 AM  Result Value Ref Range   Glucose-Capillary 101 (H) 65 - 99 mg/dL  Glucose, capillary     Status: Abnormal   Collection Time: 02/01/16 11:45 AM  Result Value Ref Range   Glucose-Capillary 103 (H) 65 - 99 mg/dL    US Abdomen Complete  Result Date: 02/01/2016 CLINICAL DATA:  Sudden onset of right upper quadrant abdominal pain and nausea. History of hypertension, diabetes and GERD. EXAM: ABDOMEN ULTRASOUND COMPLETE COMPARISON:  None. FINDINGS: Gallbladder: There are several small gallstones an layering echogenic sludge within an otherwise normal-appearing gallbladder. Dominant echogenic gallstone within the neck of the gallbladder measures approximately 0.9 cm in diameter (image 3). No definitive gallbladder wall thickening or pericholecystic fluid. Common bile duct: Diameter: Normal in size measuring 3 mm in diameter Liver: There is mild diffuse slightly increased and coarsened echogenicity of the hepatic parenchyma. No discrete hepatic lesions. No definite evidence of intrahepatic biliary ductal dilatation. No ascites. IVC: No abnormality visualized. Pancreas: Query mild dilatation of the pancreatic duct measuring approximately 3 mm in diameter (image 54), the etiology of which is not depicted on this examination. Spleen:  Normal in size measuring 5.4 cm in length Right Kidney: Normal cortical thickness, echogenicity and size, measuring 12.4 cm in length. No focal renal lesions. No echogenic renal stones. No urinary obstruction. Left Kidney: Normal cortical thickness, echogenicity and size, measuring 12.7 cm in length. No focal renal lesions. No echogenic renal stones. No urinary obstruction. Abdominal aorta: Only seen proximally, however the proximal aorta is of normal caliber. The mid and distal aspects of the abdominal aorta obscured by bowel gas. Other findings: None. IMPRESSION: 1. Cholelithiasis without evidence of cholecystitis. If clinical concern persists for acute cholecystitis, further evaluation could be performed with nuclear medicine HIDA scan as clinically indicated. 2. Potential mild dilatation of the pancreatic duct measuring approximately 3 mm in diameter, the etiology of which is not depicted on this examination. Further evaluation with nonemergent MRCP could be performed as clinically indicated. 3. Suspected hepatic steatosis. Electronically Signed   By: Sandi Mariscal M.D.   On: 02/01/2016 02:37  Mr 3d Recon At Scanner  Result Date: 02/01/2016 CLINICAL DATA:  66 year old male with abdominal pain and elevated bili Rubin. Possible dilatation of the pancreatic duct noted on prior ultrasound examination. EXAM: MRI ABDOMEN WITHOUT AND WITH CONTRAST (INCLUDING MRCP) TECHNIQUE: Multiplanar multisequence MR imaging of the  abdomen was performed both before and after the administration of intravenous contrast. Heavily T2-weighted images of the biliary and pancreatic ducts were obtained, and three-dimensional MRCP images were rendered by post processing. CONTRAST:  51m MULTIHANCE GADOBENATE DIMEGLUMINE 529 MG/ML IV SOLN COMPARISON:  No prior abdominal MRI. Abdominal ultrasound 02/01/2016. FINDINGS: Lower chest:  Mild cardiomegaly. Hepatobiliary: Diffuse loss of signal intensity throughout the hepatic parenchyma on out of  phase dual echo images. No suspicious cystic or solid hepatic lesions. No intrahepatic biliary ductal dilatation. MRCP images are limited by extensive patient respiratory motion. With these limitations in mind, there are some filling defects within the gallbladder, compatible with gallstones. Common bile duct does appear mildly dilated measuring 8 mm in the porta hepatis. There is a suggestion of some filling defects in the common bile duct on MRCP images (image 79 of series 5), which could indicate choledocholithiasis, however, this cannot be corroborated reliably on additional pulse sequences. Pancreas: No pancreatic mass. No pancreatic ductal dilatation on MRCP images. No pancreatic or peripancreatic fluid or inflammatory changes. Spleen: Unremarkable. Adrenals/Urinary Tract: Bilateral kidneys and bilateral adrenal glands are normal in appearance. No hydroureteronephrosis in the visualized abdomen. Stomach/Bowel: Visualized portions are unremarkable. Vascular/Lymphatic: No aneurysm identified in the visualized abdominal vasculature. No lymphadenopathy noted in the abdomen. Other: No significant volume of ascites in the visualized peritoneal cavity. Musculoskeletal: No aggressive osseous lesions are noted in the visualized portions of the skeleton. IMPRESSION: 1. No pancreatic ductal dilatation. 2. Mild common bile duct dilatation (8 mm in diameter). There is a suggestion of filling defects in the common bile duct, which is concerning for potential choledocholithiasis. At this time, there is no intrahepatic biliary ductal dilatation to suggest frank biliary tract obstruction. Today's study was slightly limited by extensive patient motion. Correlation with ERCP may be warranted if clinically appropriate. 3. Cholelithiasis without evidence of acute cholecystitis. Hepatic steatosis. 4. Additional incidental findings, as above. Electronically Signed   By: DVinnie LangtonM.D.   On: 02/01/2016 14:46  Mr AJeananne RamaW/wo  Cm/mrcp  Result Date: 02/01/2016 CLINICAL DATA:  65year old male with abdominal pain and elevated bili Rubin. Possible dilatation of the pancreatic duct noted on prior ultrasound examination. EXAM: MRI ABDOMEN WITHOUT AND WITH CONTRAST (INCLUDING MRCP) TECHNIQUE: Multiplanar multisequence MR imaging of the abdomen was performed both before and after the administration of intravenous contrast. Heavily T2-weighted images of the biliary and pancreatic ducts were obtained, and three-dimensional MRCP images were rendered by post processing. CONTRAST:  215mMULTIHANCE GADOBENATE DIMEGLUMINE 529 MG/ML IV SOLN COMPARISON:  No prior abdominal MRI. Abdominal ultrasound 02/01/2016. FINDINGS: Lower chest:  Mild cardiomegaly. Hepatobiliary: Diffuse loss of signal intensity throughout the hepatic parenchyma on out of phase dual echo images. No suspicious cystic or solid hepatic lesions. No intrahepatic biliary ductal dilatation. MRCP images are limited by extensive patient respiratory motion. With these limitations in mind, there are some filling defects within the gallbladder, compatible with gallstones. Common bile duct does appear mildly dilated measuring 8 mm in the porta hepatis. There is a suggestion of some filling defects in the common bile duct on MRCP images (image 79 of series 5), which could indicate choledocholithiasis, however, this cannot be corroborated reliably on additional pulse sequences. Pancreas: No pancreatic mass. No pancreatic ductal dilatation on MRCP images. No pancreatic or peripancreatic fluid or inflammatory changes. Spleen: Unremarkable. Adrenals/Urinary Tract: Bilateral kidneys and bilateral adrenal glands are normal in appearance. No hydroureteronephrosis in the visualized abdomen. Stomach/Bowel: Visualized portions are unremarkable. Vascular/Lymphatic: No aneurysm identified in  the visualized abdominal vasculature. No lymphadenopathy noted in the abdomen. Other: No significant volume of  ascites in the visualized peritoneal cavity. Musculoskeletal: No aggressive osseous lesions are noted in the visualized portions of the skeleton. IMPRESSION: 1. No pancreatic ductal dilatation. 2. Mild common bile duct dilatation (8 mm in diameter). There is a suggestion of filling defects in the common bile duct, which is concerning for potential choledocholithiasis. At this time, there is no intrahepatic biliary ductal dilatation to suggest frank biliary tract obstruction. Today's study was slightly limited by extensive patient motion. Correlation with ERCP may be warranted if clinically appropriate. 3. Cholelithiasis without evidence of acute cholecystitis. Hepatic steatosis. 4. Additional incidental findings, as above. Electronically Signed   By: Vinnie Langton M.D.   On: 02/01/2016 14:46   Review of Systems  Constitutional: Negative for chills, fever and malaise/fatigue.  Gastrointestinal: Positive for abdominal pain. Negative for nausea and vomiting.  Musculoskeletal: Negative for back pain.   Blood pressure 134/67, pulse 85, temperature 98.6 F (37 C), temperature source Oral, resp. rate 18, height 5' 10"  (1.778 m), weight 122.5 kg (270 lb), SpO2 97 %. Physical Exam  Constitutional:  Obese male in NAD  Eyes: EOM are normal. No scleral icterus.  Cardiovascular: Normal rate and regular rhythm.   Respiratory: Effort normal and breath sounds normal.  GI: Soft. He exhibits no mass. There is tenderness (RUQ). There is no guarding.  Neurological: He is alert.  Skin: Skin is warm and dry.  Psychiatric: He has a normal mood and affect. His behavior is normal.    Assessment/Plan: Cholelithiasis with choledocholithiasis-Was seen by GI prior to MRCP  Rec:  ERCP followed by laparoscopic cholecystectomy. Repeat lab tomorrow.  Safiyyah Vasconez J 02/01/2016, 3:28 PM

## 2016-02-01 NOTE — Consult Note (Signed)
Referring Provider: [facility] Primary Care Physician:  Elby Showers, MD Primary Gastroenterologist:  Dr. Carlean Purl  Reason for Consultation:  Elevated LFT's, abdominal pain, abnormal ultrasound  HPI: [name] [name] is a 65 y.o. male with history of diabetes mellitus type 2, hypertension, hyperlipidemia who presented to the ER last night because of persistent abdominal pain since yesterday morning. He says that he had sudden onset of abdominal pain late yesterday morning. He went home to rest and took some Pepto-Bismol and eventually the pain did ease up for a little while. Then last night the pain returned again and he could not get comfortable lying down etc. Patient's pain is in the upper abdomen, mostly epigastric and right upper quadrant. Denies any radiation. Denies any associated shortness of breath or chest pain. Denies any nausea or vomiting, fevers or chills. In the ER patient's labs show elevated LFTs with total bili of 3.3, alkaline phosphatase 165, ALT 295, AST 278.  LFT's completely normal 5 months ago. Ultrasound showed gallstones and dilated pancreatic duct to 3 mm.  Surgery was called and they recommended GI consult. LFTs have trended down slightly this morning. Patient says the pain is improved with morphine but otherwise is still present. Patient denies any similar episodes in the past. He has been placed on IV antibiotics.   Past Medical History:  Diagnosis Date  . Arthritis   . Diabetes mellitus without complication ([place])   . Hypertension     Past Surgical History:  Procedure Laterality Date  . SPINE SURGERY      Prior to Admission medications   Medication Sig Start Date End Date Taking? Authorizing Provider  amLODipine (NORVASC) 10 MG tablet Take 10 mg by mouth daily.   Yes Historical Provider, MD  aspirin-sod bicarb-citric acid (ALKA-SELTZER) 325 MG TBEF tablet Take 325 mg by mouth every 6 (six) hours as needed (pain).   Yes Historical Provider, MD  atorvastatin  (LIPITOR) 10 MG tablet Take 1 tablet (10 mg total) by mouth daily. 05/19/15  Yes Elby Showers, MD  bismuth subsalicylate (PEPTO BISMOL) 262 MG/15ML suspension Take 30 mLs by mouth every 6 (six) hours as needed for indigestion.   Yes Historical Provider, MD  esomeprazole (NEXIUM) 40 MG capsule TAKE 1 CAPSULE BY MOUTH ONCE DAILY 10/06/15  Yes Elby Showers, MD  ibuprofen (ADVIL,MOTRIN) 200 MG tablet Take 400 mg by mouth every 6 (six) hours as needed for moderate pain.   Yes Historical Provider, MD  lisinopril (PRINIVIL,ZESTRIL) 10 MG tablet TAKE 1 TABLET BY MOUTH DAILY. 07/07/15  Yes Elby Showers, MD  meloxicam (MOBIC) 15 MG tablet Take 1 tablet (15 mg total) by mouth daily. 12/12/15  Yes Elby Showers, MD  metFORMIN (GLUCOPHAGE-XR) 500 MG 24 hr tablet TAKE 1 TABLET BY MOUTH DAILY WITH BREAKFAST. 10/06/15  Yes Elby Showers, MD  furosemide (LASIX) 40 MG tablet Take 1 tablet (40 mg total) by mouth daily. Patient not taking: Reported on 02/01/2016 05/19/15   Elby Showers, MD  VIAGRA 100 MG tablet TAKE 1 TABLET BY MOUTH ONCE DAILY AS NEEDED 06/06/15   Elby Showers, MD    Current Facility-Administered Medications  Medication Dose Route Frequency Provider Last Rate Last Dose  . 0.9 %  sodium chloride infusion   Intravenous Continuous Rise Patience, MD 10 mL/hr at 02/01/16 [phone]    . acetaminophen (TYLENOL) tablet 650 mg  650 mg Oral Q6H PRN Rise Patience, MD       Or  .  acetaminophen (TYLENOL) suppository 650 mg  650 mg Rectal Q6H PRN Rise Patience, MD      . hydrALAZINE (APRESOLINE) injection 10 mg  10 mg Intravenous Q4H PRN Rise Patience, MD      . insulin aspart (novoLOG) injection 0-9 Units  0-9 Units Subcutaneous Q4H Rise Patience, MD      . morphine 2 MG/ML injection 2 mg  2 mg Intravenous Q4H PRN Rise Patience, MD      . morphine 4 MG/ML injection 4 mg  4 mg Intravenous [id number] PRN Delora Fuel, MD   4 mg at [id number]  . ondansetron (ZOFRAN) injection 4 mg  4 mg  Intravenous [id number] PRN Delora Fuel, MD      . ondansetron [facility]) tablet 4 mg  4 mg Oral Q6H PRN Rise Patience, MD       Or  . ondansetron [facility]) injection 4 mg  4 mg Intravenous Q6H PRN Rise Patience, MD      . piperacillin-tazobactam (ZOSYN) IVPB 3.375 g  3.375 g Intravenous Q8H Dorrene German, RPH        Allergies as of 01/31/2016  . (No Known Allergies)    Family History  Problem Relation Age of Onset  . Stroke Mother   . Hypertension Mother     Social History   Social History  . Marital status: Married    Spouse name: N/A  . Number of children: N/A  . Years of education: N/A   Occupational History  . Not on file.   Social History Main Topics  . Smoking status: Never Smoker  . Smokeless tobacco: Never Used  . Alcohol use No  . Drug use: No  . Sexual activity: Yes   Other Topics Concern  . Not on file   Social History Narrative  . No narrative on file    Review of Systems: Ten point ROS is O/W negative except as mentioned in HPI.  Physical Exam: Vital signs in last 24 hours: Temp:  [98.4 F (36.9 C)] 98.4 F (36.9 C) (07/27 0430) Pulse Rate:  [66-77] 69 (07/27 0430) Resp:  [16-26] 16 (07/27 0330) BP: (142-166)/(68-93) 166/86 (07/27 0430) SpO2:  [92 %-99 %] 96 % (07/27 0430) Weight:  [270 lb (122.5 kg)] 270 lb (122.5 kg) (07/26 2323) Last BM Date: 01/31/16 General:  Alert, Well-developed, well-nourished, pleasant and cooperative in NAD Head:  Normocephalic and atraumatic. Eyes:  Sclera clear, no icterus.  Conjunctiva pink. Ears:  Normal auditory acuity. Mouth:  No deformity or lesions.   Lungs:  Clear throughout to auscultation.  No wheezes, crackles, or rhonchi.  Heart:  Regular rate and rhythm; no murmurs, clicks, rubs, or gallops. Abdomen:  Soft, non-distended.  BS present.  Mild RUQ TTP. Rectal:  Deferred  Msk:  Symmetrical without gross deformities. Pulses:  Normal pulses noted. Extremities:  Without clubbing or  edema. Neurologic:  Alert and oriented x 4;  grossly normal neurologically. Skin:  Intact without significant lesions or rashes. Psych:  Alert and cooperative. Normal mood and affect.  Lab Results:  Recent Labs  01/31/16 2346 02/01/16 0550  WBC 5.5 6.5  HGB 13.9 13.3  HCT 41.7 39.7  PLT 260 251   BMET  Recent Labs  01/31/16 2346 02/01/16 0550  NA 138 136  K 3.6 4.0  CL 105 106  CO2 27 24  GLUCOSE 143* 126*  BUN 11 10  CREATININE 0.86 0.84  CALCIUM 9.0 8.5*   LFT  Recent  Labs  02/01/16 0550  PROT 7.2  ALBUMIN 3.9  AST 177*  ALT 260*  ALKPHOS 163*  BILITOT 2.2*  BILIDIR 0.7*  IBILI 1.5*   Studies/Results: US Abdomen Complete  Result Date: 02/01/2016 CLINICAL DATA:  Sudden onset of right upper quadrant abdominal pain and nausea. History of hypertension, diabetes and GERD. EXAM: ABDOMEN ULTRASOUND COMPLETE COMPARISON:  None. FINDINGS: Gallbladder: There are several small gallstones an layering echogenic sludge within an otherwise normal-appearing gallbladder. Dominant echogenic gallstone within the neck of the gallbladder measures approximately 0.9 cm in diameter (image 3). No definitive gallbladder wall thickening or pericholecystic fluid. Common bile duct: Diameter: Normal in size measuring 3 mm in diameter Liver: There is mild diffuse slightly increased and coarsened echogenicity of the hepatic parenchyma. No discrete hepatic lesions. No definite evidence of intrahepatic biliary ductal dilatation. No ascites. IVC: No abnormality visualized. Pancreas: Query mild dilatation of the pancreatic duct measuring approximately 3 mm in diameter (image 54), the etiology of which is not depicted on this examination. Spleen: Normal in size measuring 5.4 cm in length Right Kidney: Normal cortical thickness, echogenicity and size, measuring 12.4 cm in length. No focal renal lesions. No echogenic renal stones. No urinary obstruction. Left Kidney: Normal cortical thickness, echogenicity  and size, measuring 12.7 cm in length. No focal renal lesions. No echogenic renal stones. No urinary obstruction. Abdominal aorta: Only seen proximally, however the proximal aorta is of normal caliber. The mid and distal aspects of the abdominal aorta obscured by bowel gas. Other findings: None. IMPRESSION: 1. Cholelithiasis without evidence of cholecystitis. If clinical concern persists for acute cholecystitis, further evaluation could be performed with nuclear medicine HIDA scan as clinically indicated. 2. Potential mild dilatation of the pancreatic duct measuring approximately 3 mm in diameter, the etiology of which is not depicted on this examination. Further evaluation with nonemergent MRCP could be performed as clinically indicated. 3. Suspected hepatic steatosis. Electronically Signed   By: Sandi Mariscal M.D.   On: 02/01/2016 02:37  IMPRESSION:  -65 year-old male with sudden onset abdominal pain, newly elevated LFTs, and ultrasound showing gallstones and dilation of pancreatic duct.  ? Acute cholecystitis versus choledocholithiasis. LFTs trending down slightly this morning. On IV antibiotics.  PLAN: -We will check MRCP to rule out retained stone prior to proceeding with ERCP versus surgical consult. -Trend LFTs.   Adelyne Marchese D.  02/01/2016, 9:22 AM  Pager number [phone]

## 2016-02-02 ENCOUNTER — Inpatient Hospital Stay (HOSPITAL_COMMUNITY): Payer: 59 | Admitting: Certified Registered Nurse Anesthetist

## 2016-02-02 ENCOUNTER — Encounter (HOSPITAL_COMMUNITY)
Admission: EM | Disposition: A | Discharge status: Home / Self Care | Payer: Self-pay | Source: Home / Self Care | Attending: Family Medicine

## 2016-02-02 ENCOUNTER — Inpatient Hospital Stay (HOSPITAL_COMMUNITY): Payer: 59

## 2016-02-02 ENCOUNTER — Encounter (HOSPITAL_COMMUNITY): Payer: Self-pay | Admitting: Anesthesiology

## 2016-02-02 DIAGNOSIS — R932 Abnormal findings on diagnostic imaging of liver and biliary tract: Secondary | ICD-10-CM

## 2016-02-02 HISTORY — PX: ERCP: SHX5425

## 2016-02-02 LAB — HEPATITIS PANEL, ACUTE
HCV Ab: <0.1 s/co ratio (ref 0.0–0.9)
Hep A IgM: NEGATIVE
Hep B C IgM: NEGATIVE
Hepatitis B Surface Ag: NEGATIVE

## 2016-02-02 LAB — COMPREHENSIVE METABOLIC PANEL
ALBUMIN: 3.5 g/dL (ref 3.5–5.0)
ALK PHOS: 135 U/L — AB (ref 38–126)
ALT: 155 U/L — AB (ref 17–63)
ANION GAP: 7 (ref 5–15)
AST: 58 U/L — AB (ref 15–41)
BUN: 10 mg/dL (ref 6–20)
CALCIUM: 8.4 mg/dL — AB (ref 8.9–10.3)
CO2: 24 mmol/L (ref 22–32)
Chloride: 105 mmol/L (ref 101–111)
Creatinine, Ser: 0.96 mg/dL (ref 0.61–1.24)
GFR calc Af Amer: >60 mL/min (ref >60)
GFR calc non Af Amer: >60 mL/min (ref >60)
GLUCOSE: 102 mg/dL — AB (ref 65–99)
Potassium: 3.6 mmol/L (ref 3.5–5.1)
SODIUM: 136 mmol/L (ref 135–145)
Total Bilirubin: 2.6 mg/dL — ABNORMAL HIGH (ref 0.3–1.2)
Total Protein: 7.3 g/dL (ref 6.5–8.1)

## 2016-02-02 LAB — CBC WITH DIFFERENTIAL/PLATELET
BASOS PCT: 0 %
Basophils Absolute: 0 10*3/uL (ref 0.0–0.1)
Eosinophils Absolute: 0 10*3/uL (ref 0.0–0.7)
Eosinophils Relative: 0 %
HEMATOCRIT: 39.2 % (ref 39.0–52.0)
Hemoglobin: 13.1 g/dL (ref 13.0–17.0)
Lymphocytes Relative: 14 %
Lymphs Abs: 1.6 10*3/uL (ref 0.7–4.0)
MCH: 30.9 pg (ref 26.0–34.0)
MCHC: 33.4 g/dL (ref 30.0–36.0)
MCV: 92.5 fL (ref 78.0–100.0)
MONO ABS: 0.9 10*3/uL (ref 0.1–1.0)
MONOS PCT: 7 %
NEUTROS ABS: 9.2 10*3/uL — AB (ref 1.7–7.7)
Neutrophils Relative %: 79 %
Platelets: 232 10*3/uL (ref 150–400)
RBC: 4.24 MIL/uL (ref 4.22–5.81)
RDW: 13.7 % (ref 11.5–15.5)
WBC: 11.7 10*3/uL — ABNORMAL HIGH (ref 4.0–10.5)

## 2016-02-02 LAB — GLUCOSE, CAPILLARY
GLUCOSE-CAPILLARY: 110 mg/dL — AB (ref 65–99)
GLUCOSE-CAPILLARY: 122 mg/dL — AB (ref 65–99)
Glucose-Capillary: 107 mg/dL — ABNORMAL HIGH (ref 65–99)
Glucose-Capillary: 122 mg/dL — ABNORMAL HIGH (ref 65–99)
Glucose-Capillary: 132 mg/dL — ABNORMAL HIGH (ref 65–99)
Glucose-Capillary: 138 mg/dL — ABNORMAL HIGH (ref 65–99)
Glucose-Capillary: 158 mg/dL — ABNORMAL HIGH (ref 65–99)

## 2016-02-02 SURGERY — CANCELLED PROCEDURE

## 2016-02-02 SURGERY — ERCP, WITH INTERVENTION IF INDICATED
Anesthesia: General

## 2016-02-02 MED ORDER — FENTANYL CITRATE (PF) 100 MCG/2ML IJ SOLN
INTRAMUSCULAR | Status: DC | PRN
  Administered 2016-02-02 (×3): 50 ug via INTRAVENOUS

## 2016-02-02 MED ORDER — FENTANYL CITRATE (PF) 100 MCG/2ML IJ SOLN: 25.0000 ug | INTRAMUSCULAR | Status: DC | PRN

## 2016-02-02 MED ORDER — ONDANSETRON HCL 4 MG/2ML IJ SOLN
INTRAMUSCULAR | Status: DC | PRN
  Administered 2016-02-02: 4 mg via INTRAVENOUS

## 2016-02-02 MED ORDER — PROPOFOL 10 MG/ML IV BOLUS
INTRAVENOUS | Status: AC
  Filled 2016-02-02: qty 20

## 2016-02-02 MED ORDER — INDOMETHACIN 50 MG RE SUPP
RECTAL | Status: DC | PRN
  Administered 2016-02-02: 100 mg via RECTAL

## 2016-02-02 MED ORDER — LACTATED RINGERS IV SOLN
INTRAVENOUS | Status: DC
  Administered 2016-02-02: 1000 mL via INTRAVENOUS
  Administered 2016-02-02: 12:00:00 via INTRAVENOUS

## 2016-02-02 MED ORDER — MIDAZOLAM HCL 2 MG/2ML IJ SOLN
INTRAMUSCULAR | Status: AC
  Filled 2016-02-02: qty 2

## 2016-02-02 MED ORDER — GLUCAGON HCL RDNA (DIAGNOSTIC) 1 MG IJ SOLR
INTRAMUSCULAR | Status: AC
  Filled 2016-02-02: qty 1

## 2016-02-02 MED ORDER — SODIUM CHLORIDE 0.9 % IV SOLN: INTRAVENOUS | Status: DC

## 2016-02-02 MED ORDER — SUGAMMADEX SODIUM 200 MG/2ML IV SOLN
INTRAVENOUS | Status: DC | PRN
Start: 2016-02-02 — End: 2016-02-02
  Administered 2016-02-02: 300 mg via INTRAVENOUS

## 2016-02-02 MED ORDER — PROMETHAZINE HCL 25 MG/ML IJ SOLN: 6.2500 mg | INTRAMUSCULAR | Status: DC | PRN

## 2016-02-02 MED ORDER — IOPAMIDOL (ISOVUE-370) INJECTION 76%
INTRAVENOUS | Status: DC | PRN
  Administered 2016-02-02: 50 mg

## 2016-02-02 MED ORDER — INDOMETHACIN 50 MG RE SUPP
RECTAL | Status: AC
  Filled 2016-02-02: qty 2

## 2016-02-02 MED ORDER — FENTANYL CITRATE (PF) 250 MCG/5ML IJ SOLN
INTRAMUSCULAR | Status: AC
Start: 2016-02-02 — End: 2016-02-02
  Filled 2016-02-02: qty 5

## 2016-02-02 MED ORDER — LIDOCAINE HCL (CARDIAC) 20 MG/ML IV SOLN
INTRAVENOUS | Status: DC | PRN
  Administered 2016-02-02: 100 mg via INTRAVENOUS

## 2016-02-02 MED ORDER — ESMOLOL HCL 100 MG/10ML IV SOLN
INTRAVENOUS | Status: AC
  Filled 2016-02-02: qty 10

## 2016-02-02 MED ORDER — PROPOFOL 10 MG/ML IV BOLUS
INTRAVENOUS | Status: DC | PRN
  Administered 2016-02-02: 200 mg via INTRAVENOUS

## 2016-02-02 MED ORDER — SUCCINYLCHOLINE CHLORIDE 20 MG/ML IJ SOLN
INTRAMUSCULAR | Status: DC | PRN
  Administered 2016-02-02: 100 mg via INTRAVENOUS

## 2016-02-02 MED ORDER — SUGAMMADEX SODIUM 200 MG/2ML IV SOLN
INTRAVENOUS | Status: AC
  Filled 2016-02-02: qty 2

## 2016-02-02 MED ORDER — ROCURONIUM BROMIDE 100 MG/10ML IV SOLN
INTRAVENOUS | Status: DC | PRN
  Administered 2016-02-02: 50 mg via INTRAVENOUS

## 2016-02-02 MED ORDER — LIDOCAINE HCL (CARDIAC) 20 MG/ML IV SOLN
INTRAVENOUS | Status: AC
  Filled 2016-02-02: qty 5

## 2016-02-02 NOTE — Op Note (Signed)
[facility] Patient Name: [name] [name] Procedure Date: 02/02/2016 MRN: [id number] Attending MD: [name] , MD Date of Birth: 1951-06-06 CSN: [id number] Age: 65 Admit Type: Inpatient Procedure:                ERCP Indications:              Biliary dilation on Ultrasound, Suspected bile duct                            stone(s) Providers:                [name], MD, Hilma Favors, RN, Alfonso Patten,                            Technician, Adair Laundry, CRNA Referring MD:              Medicines:                General Anesthesia Complications:            No immediate complications. Estimated Blood Loss:     Estimated blood loss: none. Procedure:                Pre-Anesthesia Assessment:                           - Prior to the procedure, a History and Physical                            was performed, and patient medications and                            allergies were reviewed. The patient's tolerance of                            previous anesthesia was also reviewed. The risks                            and benefits of the procedure and the sedation                            options and risks were discussed with the patient.                            All questions were answered, and informed consent                            was obtained. Prior Anticoagulants: The patient has                            taken no previous anticoagulant or antiplatelet                            agents. ASA Grade Assessment: III - A patient with  severe systemic disease. After reviewing the risks                            and benefits, the patient was deemed in                            satisfactory condition to undergo the procedure.                           After obtaining informed consent, the scope was                            passed under direct vision. Throughout the                            procedure, the patient's blood pressure,  pulse, and                            oxygen saturations were monitored continuously. The                            [id number]) scope was introduced through                            the mouth, and used to inject contrast into and                            used to inject contrast into the bile duct. The                            ERCP was accomplished without difficulty. The                            patient tolerated the procedure well. Scope In: Scope Out: Findings:      The scout film was normal. The esophagus was successfully intubated       under direct vision. The scope was advanced to a normal major papilla in       the descending duodenum without detailed examination of the pharynx,       larynx and associated structures, and upper GI tract. The upper GI tract       was grossly normal. The bile duct was deeply cannulated with the       short-nosed traction sphincterotome. Contrast was injected.       Opacification of the common bile duct, common hepatic duct and left and       right hepatic ducts and all intrahepatic branches was successful. The       maximum diameter of the ducts was 10 mm. The biliary tree was otherwise       normal. A 4 mm biliary sphincterotomy was made with a traction       (standard) sphincterotome using ERBE electrocautery. There was no       post-sphincterotomy bleeding. To discover objects, the biliary tree was       swept with a 12 mm balloon starting at the upper third of the main bile  duct. Nothing was found. Impression:               - A biliary sphincterotomy was performed.                           - The biliary tree was swept and nothing was found. Moderate Sedation:      Please see anesthesia notes, moderate sedation not given Recommendation:           - Return patient to hospital ward for ongoing care.                           - Clear liquid diet.                           - [Diet Recommendation] [Duration].                            - Surgical consultation for consideration of                            cholecystectomy. Procedure Code(s):        --- Professional ---                           [phone], Endoscopic retrograde                            cholangiopancreatography (ERCP); with                            sphincterotomy/papillotomy Diagnosis Code(s):        --- Professional ---                           K83.8, Other specified diseases of biliary tract CPT copyright 2016 [employer]. All rights reserved. The codes documented in this report are preliminary and upon coder review may  be revised to meet current compliance requirements. [name], MD 02/02/2016 11:54:04 AM This report has been signed electronically. Number of Addenda: 0

## 2016-02-02 NOTE — Anesthesia Procedure Notes (Signed)
Procedure Name: Intubation Date/Time: 02/02/2016 11:20 AM Performed by: Montel Clock Pre-anesthesia Checklist: Patient identified, Emergency Drugs available, Suction available and Patient being monitored Patient Re-evaluated:Patient Re-evaluated prior to inductionOxygen Delivery Method: Circle system utilized Preoxygenation: Pre-oxygenation with 100% oxygen Intubation Type: IV induction Ventilation: Mask ventilation with difficulty, Two handed mask ventilation required and Oral airway inserted - appropriate to patient size Laryngoscope Size: Mac and 3 Grade View: Grade I Tube type: Oral Tube size: 7.5 mm Number of attempts: 1 Airway Equipment and Method: Stylet and Oral airway Placement Confirmation: ETT inserted through vocal cords under direct vision,  positive ETCO2 and breath sounds checked- equal and bilateral Secured at: 23 cm Tube secured with: Tape Dental Injury: Teeth and Oropharynx as per pre-operative assessment

## 2016-02-02 NOTE — Anesthesia Postprocedure Evaluation (Signed)
Anesthesia Post Note  Patient: [name] [name]  Procedure(s) Performed: Procedure(s) (LRB): ENDOSCOPIC RETROGRADE CHOLANGIOPANCREATOGRAPHY (ERCP) (N/A)  Patient location during evaluation: PACU Anesthesia Type: General Level of consciousness: awake and alert Pain management: pain level controlled Vital Signs Assessment: post-procedure vital signs reviewed and stable Respiratory status: spontaneous breathing, nonlabored ventilation, respiratory function stable and patient connected to nasal cannula oxygen Cardiovascular status: blood pressure returned to baseline and stable Postop Assessment: no signs of nausea or vomiting Anesthetic complications: no    Last Vitals:  Vitals:   02/02/16 1315 02/02/16 1414  BP: (!) 150/80   Pulse: 90   Resp:    Temp:  36.5 C    Last Pain:  Vitals:   02/02/16 1307  TempSrc:   PainSc: 0-No pain                 [name]

## 2016-02-02 NOTE — Progress Notes (Signed)
PROGRESS NOTE    [name] [name]  [id number] DOB: 01/03/1951 DOA: 01/31/2016 PCP: Elby Showers, MD     Brief Narrative: HPI as per Dr. Hal Hope: HPI: [name] [name] is a 65 y.o. male with history of diabetes mellitus type 2, hypertension, hyperlipidemia presents to the ER because of persistent abdominal pain since yesterday morning. Patient's pain is mostly in the epigastric area. Denies any radiation. Denies any associated shortness of breath or chest pain. Denies any nausea vomiting but has been having at least 5 episodes of loose stools. In the ER patient's labs show elevated LFTs with sonogram showing gallstones and dilated pancreatic duct. Patient denies any fever or chills. Patient is being admitted for further management of abdominal pain with gallstones and possible obstruction in the pancreatic duct.   ED Course: Was given pain relief medications. Has sonogram as discussed above. With labs showing elevated LFTs.  Since admission, patient's labs have trended down.  He remains febrile to 100.7 but generally has adequate pain control.  MRCP appears to show obstructing stone and GI is planning for ERCP today (7/28).  Gen Surg is planning for cholecystectomy to follow (possibly 7/29).  Assessment & Plan:   Principal Problem:   Cholecystitis, acute Active Problems:   Hypertension   Type 2 diabetes mellitus ([place])   Cholelithiasis   Elevated liver enzymes  Acute cholecystitis with cholelithiasis -Persistently elevated WBC count with fever -On Zosyn Day #2 -MRCP on 7/27 showed no pancreatic ductal dilatation but mild CBD dilatation (8 mm) with suggestion of filling defects in the CBD concerning for choledocholithiasis.  Cholelithiasis also seen without evidence of acute cholecystitis -For ERCP today by GI -For cholecystectomy by Gen Surg following ERCP, possibly tomorrow -Currently adequate pain control -Improving LFTs although bilirubin is not  improving  HTN -Suboptimal control -May be pain related -If ongoing/worsening, consider IV options -Home medications (Norvasc, Lisinopril) being held while patient is NPO  DM -Glucose ranges from 103-138, so generally good control despite no medication -Continue to follow with SSI     DVT prophylaxis: SCDs. Code Status: Full code.  Family Communication: Discussed with patient.  Disposition Plan: Home.  Admission status: Inpatient. MedSurg.   Consultants:   General Surgery  GI  Procedures:  MRCP  Upcoming: ERCP  Upcoming: Cholecystectomy  Antimicrobials:   Zosyn, started 7/27   Subjective: Adequate pain control.  Ready to proceed with procedures for resolution of pain.  Objective: Vitals:   02/01/16 1447 02/01/16 1804 02/01/16 2118 02/02/16 0553  BP: 134/67 (!) 151/83 (!) 146/56 (!) 147/72  Pulse: 85 88 91 96  Resp: 18 20 20 20   Temp: 98.6 F (37 C) (!) 100.7 F (38.2 C) 100 F (37.8 C) (!) 100.5 F (38.1 C)  TempSrc: Oral Oral Oral Oral  SpO2: 97% 97% 96% 96%  Weight:  122.5 kg (270 lb)    Height:  5\' 10"  (1.778 m)      Intake/Output Summary (Last 24 hours) at 02/02/16 0851 Last data filed at 02/01/16 2328  Gross per 24 hour  Intake              600 ml  Output              100 ml  Net              500 ml   Filed Weights   01/31/16 2323 02/01/16 1804  Weight: 122.5 kg (270 lb) 122.5 kg (270 lb)    Examination:  General exam:  Appears calm and comfortable  Respiratory system: Clear to auscultation. Respiratory effort normal. Cardiovascular system: S1 & S2 heard, RRR. No JVD, murmurs, rubs, gallops or clicks. No pedal edema. Gastrointestinal system: Abdomen is nondistended, soft and tender in epigastric region. No organomegaly or masses felt. Normal bowel sounds heard. Central nervous system: Alert and oriented. No focal neurological deficits. Extremities: Symmetric 5 x 5 power. Skin: No rashes, lesions or ulcers Psychiatry: Judgement and  insight appear normal. Mood & affect appropriate.     Data Reviewed: I have personally reviewed following labs and imaging studies  CBC:  Recent Labs Lab 01/31/16 2346 02/01/16 0550 02/02/16 0419  WBC 5.5 6.5 11.7*  NEUTROABS 3.1 5.2 9.2*  HGB 13.9 13.3 13.1  HCT 41.7 39.7 39.2  MCV 92.5 92.1 92.5  PLT 260 251 [id number]   Basic Metabolic Panel:  Recent Labs Lab 01/31/16 2346 02/01/16 0550 02/02/16 0419  NA 138 136 136  K 3.6 4.0 3.6  CL 105 106 105  CO2 27 24 24   GLUCOSE 143* 126* 102*  BUN 11 10 10   CREATININE 0.86 0.84 0.96  CALCIUM 9.0 8.5* 8.4*   GFR: Estimated Creatinine Clearance: 103.4 mL/min (by C-G formula based on SCr of 0.96 mg/dL). Liver Function Tests:  Recent Labs Lab 01/31/16 2346 02/01/16 0550 02/02/16 0419  AST 278* 177* 58*  ALT 295* 260* 155*  ALKPHOS 165* 163* 135*  BILITOT 3.3* 2.2* 2.6*  PROT 7.8 7.2 7.3  ALBUMIN 4.1 3.9 3.5    Recent Labs Lab 01/31/16 2346 02/01/16 0550  LIPASE 18 21   No results for input(s): AMMONIA in the last 168 hours. Coagulation Profile: No results for input(s): INR, PROTIME in the last 168 hours. Cardiac Enzymes: No results for input(s): CKTOTAL, CKMB, CKMBINDEX, TROPONINI in the last 168 hours. BNP (last 3 results) No results for input(s): PROBNP in the last 8760 hours. HbA1C: No results for input(s): HGBA1C in the last 72 hours. CBG:  Recent Labs Lab 02/01/16 1647 02/01/16 2117 02/02/16 0053 02/02/16 0542 02/02/16 0821  GLUCAP 103* 120* 138* 122* 132*   Lipid Profile: No results for input(s): CHOL, HDL, LDLCALC, TRIG, CHOLHDL, LDLDIRECT in the last 72 hours. Thyroid Function Tests: No results for input(s): TSH, T4TOTAL, FREET4, T3FREE, THYROIDAB in the last 72 hours. Anemia Panel: No results for input(s): VITAMINB12, FOLATE, FERRITIN, TIBC, IRON, RETICCTPCT in the last 72 hours. Urine analysis:    Component Value Date/Time   COLORURINE AMBER (A) 02/01/2016 1700   APPEARANCEUR CLOUDY  (A) 02/01/2016 1700   LABSPEC 1.039 (H) 02/01/2016 1700   PHURINE 7.0 02/01/2016 1700   GLUCOSEU NEGATIVE 02/01/2016 1700   HGBUR NEGATIVE 02/01/2016 1700   BILIRUBINUR NEGATIVE 02/01/2016 1700   BILIRUBINUR neg 02/27/2015 1021   KETONESUR NEGATIVE 02/01/2016 1700   PROTEINUR NEGATIVE 02/01/2016 1700   UROBILINOGEN negative 02/27/2015 1021   NITRITE NEGATIVE 02/01/2016 1700   LEUKOCYTESUR NEGATIVE 02/01/2016 1700   Sepsis Labs: @LABRCNTIP (procalcitonin:4,lacticidven:4)  )No results found for this or any previous visit (from the past 240 hour(s)).       Radiology Studies: US Abdomen Complete  Result Date: 02/01/2016 CLINICAL DATA:  Sudden onset of right upper quadrant abdominal pain and nausea. History of hypertension, diabetes and GERD. EXAM: ABDOMEN ULTRASOUND COMPLETE COMPARISON:  None. FINDINGS: Gallbladder: There are several small gallstones an layering echogenic sludge within an otherwise normal-appearing gallbladder. Dominant echogenic gallstone within the neck of the gallbladder measures approximately 0.9 cm in diameter (image 3). No definitive gallbladder wall thickening or pericholecystic  fluid. Common bile duct: Diameter: Normal in size measuring 3 mm in diameter Liver: There is mild diffuse slightly increased and coarsened echogenicity of the hepatic parenchyma. No discrete hepatic lesions. No definite evidence of intrahepatic biliary ductal dilatation. No ascites. IVC: No abnormality visualized. Pancreas: Query mild dilatation of the pancreatic duct measuring approximately 3 mm in diameter (image 54), the etiology of which is not depicted on this examination. Spleen: Normal in size measuring 5.4 cm in length Right Kidney: Normal cortical thickness, echogenicity and size, measuring 12.4 cm in length. No focal renal lesions. No echogenic renal stones. No urinary obstruction. Left Kidney: Normal cortical thickness, echogenicity and size, measuring 12.7 cm in length. No focal renal  lesions. No echogenic renal stones. No urinary obstruction. Abdominal aorta: Only seen proximally, however the proximal aorta is of normal caliber. The mid and distal aspects of the abdominal aorta obscured by bowel gas. Other findings: None. IMPRESSION: 1. Cholelithiasis without evidence of cholecystitis. If clinical concern persists for acute cholecystitis, further evaluation could be performed with nuclear medicine HIDA scan as clinically indicated. 2. Potential mild dilatation of the pancreatic duct measuring approximately 3 mm in diameter, the etiology of which is not depicted on this examination. Further evaluation with nonemergent MRCP could be performed as clinically indicated. 3. Suspected hepatic steatosis. Electronically Signed   By: Sandi Mariscal M.D.   On: 02/01/2016 02:37  Mr 3d Recon At Scanner  Result Date: 02/01/2016 CLINICAL DATA:  65 year old male with abdominal pain and elevated bili Rubin. Possible dilatation of the pancreatic duct noted on prior ultrasound examination. EXAM: MRI ABDOMEN WITHOUT AND WITH CONTRAST (INCLUDING MRCP) TECHNIQUE: Multiplanar multisequence MR imaging of the abdomen was performed both before and after the administration of intravenous contrast. Heavily T2-weighted images of the biliary and pancreatic ducts were obtained, and three-dimensional MRCP images were rendered by post processing. CONTRAST:  1mL MULTIHANCE GADOBENATE DIMEGLUMINE 529 MG/ML IV SOLN COMPARISON:  No prior abdominal MRI. Abdominal ultrasound 02/01/2016. FINDINGS: Lower chest:  Mild cardiomegaly. Hepatobiliary: Diffuse loss of signal intensity throughout the hepatic parenchyma on out of phase dual echo images. No suspicious cystic or solid hepatic lesions. No intrahepatic biliary ductal dilatation. MRCP images are limited by extensive patient respiratory motion. With these limitations in mind, there are some filling defects within the gallbladder, compatible with gallstones. Common bile duct does  appear mildly dilated measuring 8 mm in the porta hepatis. There is a suggestion of some filling defects in the common bile duct on MRCP images (image 79 of series 5), which could indicate choledocholithiasis, however, this cannot be corroborated reliably on additional pulse sequences. Pancreas: No pancreatic mass. No pancreatic ductal dilatation on MRCP images. No pancreatic or peripancreatic fluid or inflammatory changes. Spleen: Unremarkable. Adrenals/Urinary Tract: Bilateral kidneys and bilateral adrenal glands are normal in appearance. No hydroureteronephrosis in the visualized abdomen. Stomach/Bowel: Visualized portions are unremarkable. Vascular/Lymphatic: No aneurysm identified in the visualized abdominal vasculature. No lymphadenopathy noted in the abdomen. Other: No significant volume of ascites in the visualized peritoneal cavity. Musculoskeletal: No aggressive osseous lesions are noted in the visualized portions of the skeleton. IMPRESSION: 1. No pancreatic ductal dilatation. 2. Mild common bile duct dilatation (8 mm in diameter). There is a suggestion of filling defects in the common bile duct, which is concerning for potential choledocholithiasis. At this time, there is no intrahepatic biliary ductal dilatation to suggest frank biliary tract obstruction. Today's study was slightly limited by extensive patient motion. Correlation with ERCP may be warranted if clinically appropriate.  3. Cholelithiasis without evidence of acute cholecystitis. Hepatic steatosis. 4. Additional incidental findings, as above. Electronically Signed   By: Vinnie Langton M.D.   On: 02/01/2016 14:46  Mr Jeananne Rama W/wo Cm/mrcp  Result Date: 02/01/2016 CLINICAL DATA:  65 year old male with abdominal pain and elevated bili Rubin. Possible dilatation of the pancreatic duct noted on prior ultrasound examination. EXAM: MRI ABDOMEN WITHOUT AND WITH CONTRAST (INCLUDING MRCP) TECHNIQUE: Multiplanar multisequence MR imaging of the abdomen  was performed both before and after the administration of intravenous contrast. Heavily T2-weighted images of the biliary and pancreatic ducts were obtained, and three-dimensional MRCP images were rendered by post processing. CONTRAST:  10mL MULTIHANCE GADOBENATE DIMEGLUMINE 529 MG/ML IV SOLN COMPARISON:  No prior abdominal MRI. Abdominal ultrasound 02/01/2016. FINDINGS: Lower chest:  Mild cardiomegaly. Hepatobiliary: Diffuse loss of signal intensity throughout the hepatic parenchyma on out of phase dual echo images. No suspicious cystic or solid hepatic lesions. No intrahepatic biliary ductal dilatation. MRCP images are limited by extensive patient respiratory motion. With these limitations in mind, there are some filling defects within the gallbladder, compatible with gallstones. Common bile duct does appear mildly dilated measuring 8 mm in the porta hepatis. There is a suggestion of some filling defects in the common bile duct on MRCP images (image 79 of series 5), which could indicate choledocholithiasis, however, this cannot be corroborated reliably on additional pulse sequences. Pancreas: No pancreatic mass. No pancreatic ductal dilatation on MRCP images. No pancreatic or peripancreatic fluid or inflammatory changes. Spleen: Unremarkable. Adrenals/Urinary Tract: Bilateral kidneys and bilateral adrenal glands are normal in appearance. No hydroureteronephrosis in the visualized abdomen. Stomach/Bowel: Visualized portions are unremarkable. Vascular/Lymphatic: No aneurysm identified in the visualized abdominal vasculature. No lymphadenopathy noted in the abdomen. Other: No significant volume of ascites in the visualized peritoneal cavity. Musculoskeletal: No aggressive osseous lesions are noted in the visualized portions of the skeleton. IMPRESSION: 1. No pancreatic ductal dilatation. 2. Mild common bile duct dilatation (8 mm in diameter). There is a suggestion of filling defects in the common bile duct, which is  concerning for potential choledocholithiasis. At this time, there is no intrahepatic biliary ductal dilatation to suggest frank biliary tract obstruction. Today's study was slightly limited by extensive patient motion. Correlation with ERCP may be warranted if clinically appropriate. 3. Cholelithiasis without evidence of acute cholecystitis. Hepatic steatosis. 4. Additional incidental findings, as above. Electronically Signed   By: Vinnie Langton M.D.   On: 02/01/2016 14:46       Scheduled Meds: . insulin aspart  0-9 Units Subcutaneous Q4H  . piperacillin-tazobactam (ZOSYN)  IV  3.375 g Intravenous Q8H   Continuous Infusions: . sodium chloride 10 mL/hr at 02/01/16 0558     LOS: 1 day    Time spent: 25 minutes    Karmen Bongo, MD [facility]   If 7PM-7AM, please contact night-coverage [email or web address] Password [facility] 02/02/2016, 8:51 AM

## 2016-02-02 NOTE — Progress Notes (Signed)
Patient ID: [name] [name], male   DOB: 1950/11/19, 65 y.o.   MRN: [id number]    Subjective: Has recurrent epigastric abdominal pain today.  Objective: Vital signs in last 24 hours: Temp:  [98.6 F (37 C)-100.7 F (38.2 C)] 100.5 F (38.1 C) (07/28 0553) Pulse Rate:  [85-96] 96 (07/28 0553) Resp:  [18-20] 20 (07/28 0553) BP: (134-151)/(56-83) 147/72 (07/28 0553) SpO2:  [96 %-97 %] 96 % (07/28 0553) Weight:  [122.5 kg (270 lb)] 122.5 kg (270 lb) (07/27 1804) Last BM Date: 01/31/16  Intake/Output from previous day: 07/27 0701 - 07/28 0700 In: 600 [P.O.:600] Out: 100 [Urine:100] Intake/Output this shift: No intake/output data recorded.  General appearance: alert, cooperative and mild distress GI: abnormal findings:  moderate tenderness in the epigastrium and in the RUQ  Lab Results:   Recent Labs  02/01/16 0550 02/02/16 0419  WBC 6.5 11.7*  HGB 13.3 13.1  HCT 39.7 39.2  PLT 251 232   BMET  Recent Labs  02/01/16 0550 02/02/16 0419  NA 136 136  K 4.0 3.6  CL 106 105  CO2 24 24  GLUCOSE 126* 102*  BUN 10 10  CREATININE 0.84 0.96  CALCIUM 8.5* 8.4*   Hepatic Function Latest Ref Rng & Units 02/02/2016 02/01/2016 01/31/2016  Total Protein 6.5 - 8.1 g/dL 7.3 7.2 7.8  Albumin 3.5 - 5.0 g/dL 3.5 3.9 4.1  AST 15 - 41 U/L 58(H) 177(H) 278(H)  ALT 17 - 63 U/L 155(H) 260(H) 295(H)  Alk Phosphatase 38 - 126 U/L 135(H) 163(H) 165(H)  Total Bilirubin 0.3 - 1.2 mg/dL 2.6(H) 2.2(H) 3.3(H)  Bilirubin, Direct 0.1 - 0.5 mg/dL - 0.7(H) -    Studies/Results: US Abdomen Complete  Result Date: 02/01/2016 CLINICAL DATA:  Sudden onset of right upper quadrant abdominal pain and nausea. History of hypertension, diabetes and GERD. EXAM: ABDOMEN ULTRASOUND COMPLETE COMPARISON:  None. FINDINGS: Gallbladder: There are several small gallstones an layering echogenic sludge within an otherwise normal-appearing gallbladder. Dominant echogenic gallstone within the neck of the gallbladder  measures approximately 0.9 cm in diameter (image 3). No definitive gallbladder wall thickening or pericholecystic fluid. Common bile duct: Diameter: Normal in size measuring 3 mm in diameter Liver: There is mild diffuse slightly increased and coarsened echogenicity of the hepatic parenchyma. No discrete hepatic lesions. No definite evidence of intrahepatic biliary ductal dilatation. No ascites. IVC: No abnormality visualized. Pancreas: Query mild dilatation of the pancreatic duct measuring approximately 3 mm in diameter (image 54), the etiology of which is not depicted on this examination. Spleen: Normal in size measuring 5.4 cm in length Right Kidney: Normal cortical thickness, echogenicity and size, measuring 12.4 cm in length. No focal renal lesions. No echogenic renal stones. No urinary obstruction. Left Kidney: Normal cortical thickness, echogenicity and size, measuring 12.7 cm in length. No focal renal lesions. No echogenic renal stones. No urinary obstruction. Abdominal aorta: Only seen proximally, however the proximal aorta is of normal caliber. The mid and distal aspects of the abdominal aorta obscured by bowel gas. Other findings: None. IMPRESSION: 1. Cholelithiasis without evidence of cholecystitis. If clinical concern persists for acute cholecystitis, further evaluation could be performed with nuclear medicine HIDA scan as clinically indicated. 2. Potential mild dilatation of the pancreatic duct measuring approximately 3 mm in diameter, the etiology of which is not depicted on this examination. Further evaluation with nonemergent MRCP could be performed as clinically indicated. 3. Suspected hepatic steatosis. Electronically Signed   By: Sandi Mariscal M.D.   On: 02/01/2016  02:37  Mr 3d Recon At Scanner  Result Date: 02/01/2016 CLINICAL DATA:  65 year old male with abdominal pain and elevated bili Rubin. Possible dilatation of the pancreatic duct noted on prior ultrasound examination. EXAM: MRI ABDOMEN  WITHOUT AND WITH CONTRAST (INCLUDING MRCP) TECHNIQUE: Multiplanar multisequence MR imaging of the abdomen was performed both before and after the administration of intravenous contrast. Heavily T2-weighted images of the biliary and pancreatic ducts were obtained, and three-dimensional MRCP images were rendered by post processing. CONTRAST:  13m MULTIHANCE GADOBENATE DIMEGLUMINE 529 MG/ML IV SOLN COMPARISON:  No prior abdominal MRI. Abdominal ultrasound 02/01/2016. FINDINGS: Lower chest:  Mild cardiomegaly. Hepatobiliary: Diffuse loss of signal intensity throughout the hepatic parenchyma on out of phase dual echo images. No suspicious cystic or solid hepatic lesions. No intrahepatic biliary ductal dilatation. MRCP images are limited by extensive patient respiratory motion. With these limitations in mind, there are some filling defects within the gallbladder, compatible with gallstones. Common bile duct does appear mildly dilated measuring 8 mm in the porta hepatis. There is a suggestion of some filling defects in the common bile duct on MRCP images (image 79 of series 5), which could indicate choledocholithiasis, however, this cannot be corroborated reliably on additional pulse sequences. Pancreas: No pancreatic mass. No pancreatic ductal dilatation on MRCP images. No pancreatic or peripancreatic fluid or inflammatory changes. Spleen: Unremarkable. Adrenals/Urinary Tract: Bilateral kidneys and bilateral adrenal glands are normal in appearance. No hydroureteronephrosis in the visualized abdomen. Stomach/Bowel: Visualized portions are unremarkable. Vascular/Lymphatic: No aneurysm identified in the visualized abdominal vasculature. No lymphadenopathy noted in the abdomen. Other: No significant volume of ascites in the visualized peritoneal cavity. Musculoskeletal: No aggressive osseous lesions are noted in the visualized portions of the skeleton. IMPRESSION: 1. No pancreatic ductal dilatation. 2. Mild common bile duct  dilatation (8 mm in diameter). There is a suggestion of filling defects in the common bile duct, which is concerning for potential choledocholithiasis. At this time, there is no intrahepatic biliary ductal dilatation to suggest frank biliary tract obstruction. Today's study was slightly limited by extensive patient motion. Correlation with ERCP may be warranted if clinically appropriate. 3. Cholelithiasis without evidence of acute cholecystitis. Hepatic steatosis. 4. Additional incidental findings, as above. Electronically Signed   By: DVinnie LangtonM.D.   On: 02/01/2016 14:46  Mr AJeananne RamaW/wo Cm/mrcp  Result Date: 02/01/2016 CLINICAL DATA:  65year old male with abdominal pain and elevated bili Rubin. Possible dilatation of the pancreatic duct noted on prior ultrasound examination. EXAM: MRI ABDOMEN WITHOUT AND WITH CONTRAST (INCLUDING MRCP) TECHNIQUE: Multiplanar multisequence MR imaging of the abdomen was performed both before and after the administration of intravenous contrast. Heavily T2-weighted images of the biliary and pancreatic ducts were obtained, and three-dimensional MRCP images were rendered by post processing. CONTRAST:  298mMULTIHANCE GADOBENATE DIMEGLUMINE 529 MG/ML IV SOLN COMPARISON:  No prior abdominal MRI. Abdominal ultrasound 02/01/2016. FINDINGS: Lower chest:  Mild cardiomegaly. Hepatobiliary: Diffuse loss of signal intensity throughout the hepatic parenchyma on out of phase dual echo images. No suspicious cystic or solid hepatic lesions. No intrahepatic biliary ductal dilatation. MRCP images are limited by extensive patient respiratory motion. With these limitations in mind, there are some filling defects within the gallbladder, compatible with gallstones. Common bile duct does appear mildly dilated measuring 8 mm in the porta hepatis. There is a suggestion of some filling defects in the common bile duct on MRCP images (image 79 of series 5), which could indicate choledocholithiasis,  however, this cannot be corroborated reliably on additional  pulse sequences. Pancreas: No pancreatic mass. No pancreatic ductal dilatation on MRCP images. No pancreatic or peripancreatic fluid or inflammatory changes. Spleen: Unremarkable. Adrenals/Urinary Tract: Bilateral kidneys and bilateral adrenal glands are normal in appearance. No hydroureteronephrosis in the visualized abdomen. Stomach/Bowel: Visualized portions are unremarkable. Vascular/Lymphatic: No aneurysm identified in the visualized abdominal vasculature. No lymphadenopathy noted in the abdomen. Other: No significant volume of ascites in the visualized peritoneal cavity. Musculoskeletal: No aggressive osseous lesions are noted in the visualized portions of the skeleton. IMPRESSION: 1. No pancreatic ductal dilatation. 2. Mild common bile duct dilatation (8 mm in diameter). There is a suggestion of filling defects in the common bile duct, which is concerning for potential choledocholithiasis. At this time, there is no intrahepatic biliary ductal dilatation to suggest frank biliary tract obstruction. Today's study was slightly limited by extensive patient motion. Correlation with ERCP may be warranted if clinically appropriate. 3. Cholelithiasis without evidence of acute cholecystitis. Hepatic steatosis. 4. Additional incidental findings, as above. Electronically Signed   By: Vinnie Langton M.D.   On: 02/01/2016 14:46   Anti-infectives: Anti-infectives    Start     Dose/Rate Route Frequency Ordered Stop   02/01/16 1400  piperacillin-tazobactam (ZOSYN) IVPB 3.375 g     3.375 g 12.5 mL/hr over 240 Minutes Intravenous Every 8 hours 02/01/16 0516     02/01/16 0530  piperacillin-tazobactam (ZOSYN) IVPB 3.375 g     3.375 g 100 mL/hr over 30 Minutes Intravenous  Once 02/01/16 0516 02/01/16 0940      Assessment/Plan: Cholelithiasis and probable choledocholithiasis with suggestion of CBD stone on MRCP For ERCP today.  Will need cholecystectomy,  possibly tomorrow.  Agree with abx coverage.  Could also have acute cholecystitis    LOS: 1 day    Havard Radigan T 02/02/2016

## 2016-02-02 NOTE — Transfer of Care (Signed)
Immediate Anesthesia Transfer of Care Note  Patient: [name] [name]  Procedure(s) Performed: Procedure(s): ENDOSCOPIC RETROGRADE CHOLANGIOPANCREATOGRAPHY (ERCP) (N/A)  Patient Location: ENDO  Anesthesia Type:General  Level of Consciousness:  sedated, patient cooperative and responds to stimulation  Airway & Oxygen Therapy:Patient Spontanous Breathing and Patient connected to face mask oxgen  Post-op Assessment:  Report given to ENDO RN and Post -op Vital signs reviewed and stable  Post vital signs:  Reviewed and stable  Last Vitals:  Vitals:   02/02/16 0553 02/02/16 1011  BP: (!) 147/72 (!) 185/74  Pulse: 96 93  Resp: 20 20  Temp: (!) 38.1 C [id number] C    Complications: No apparent anesthesia complications

## 2016-02-02 NOTE — Anesthesia Preprocedure Evaluation (Addendum)
Anesthesia Evaluation  Patient identified by MRN, date of birth, ID band Patient awake    Reviewed: Allergy & Precautions, NPO status , Patient's Chart, lab work & pertinent test results  Airway Mallampati: II  TM Distance: >3 FB Neck ROM: Full    Dental no notable dental hx.    Pulmonary neg pulmonary ROS,    Pulmonary exam normal breath sounds clear to auscultation       Cardiovascular hypertension, Normal cardiovascular exam Rhythm:Regular Rate:Normal     Neuro/Psych negative neurological ROS  negative psych ROS   GI/Hepatic Neg liver ROS, GERD  ,  Endo/Other  negative endocrine ROSdiabetes  Renal/GU negative Renal ROS  negative genitourinary   Musculoskeletal  (+) Arthritis ,   Abdominal (+) + obese,   Peds negative pediatric ROS (+)  Hematology negative hematology ROS (+)   Anesthesia Other Findings   Reproductive/Obstetrics negative OB ROS                             Anesthesia Physical Anesthesia Plan  ASA: III  Anesthesia Plan: General   Post-op Pain Management:    Induction: Intravenous  Airway Management Planned: Oral ETT  Additional Equipment:   Intra-op Plan:   Post-operative Plan: Extubation in OR  Informed Consent: I have reviewed the patients History and Physical, chart, labs and discussed the procedure including the risks, benefits and alternatives for the proposed anesthesia with the patient or authorized representative who has indicated his/her understanding and acceptance.   Dental advisory given  Plan Discussed with: CRNA  Anesthesia Plan Comments:        Anesthesia Quick Evaluation

## 2016-02-03 DIAGNOSIS — I10 Essential (primary) hypertension: Secondary | ICD-10-CM

## 2016-02-03 DIAGNOSIS — R1011 Right upper quadrant pain: Secondary | ICD-10-CM

## 2016-02-03 DIAGNOSIS — R7989 Other specified abnormal findings of blood chemistry: Secondary | ICD-10-CM

## 2016-02-03 DIAGNOSIS — K810 Acute cholecystitis: Secondary | ICD-10-CM

## 2016-02-03 DIAGNOSIS — E118 Type 2 diabetes mellitus with unspecified complications: Secondary | ICD-10-CM

## 2016-02-03 DIAGNOSIS — K838 Other specified diseases of biliary tract: Secondary | ICD-10-CM

## 2016-02-03 DIAGNOSIS — R17 Unspecified jaundice: Secondary | ICD-10-CM

## 2016-02-03 LAB — CBC WITH DIFFERENTIAL/PLATELET
BASOS PCT: 0 %
Basophils Absolute: 0 10*3/uL (ref 0.0–0.1)
EOS ABS: 0 10*3/uL (ref 0.0–0.7)
EOS PCT: 0 %
HCT: 37.7 % — ABNORMAL LOW (ref 39.0–52.0)
Hemoglobin: 12.8 g/dL — ABNORMAL LOW (ref 13.0–17.0)
LYMPHS ABS: 1.4 10*3/uL (ref 0.7–4.0)
Lymphocytes Relative: 11 %
MCH: 31.4 pg (ref 26.0–34.0)
MCHC: 34 g/dL (ref 30.0–36.0)
MCV: 92.6 fL (ref 78.0–100.0)
Monocytes Absolute: 1 10*3/uL (ref 0.1–1.0)
Monocytes Relative: 8 %
NEUTROS PCT: 81 %
Neutro Abs: 10.1 10*3/uL — ABNORMAL HIGH (ref 1.7–7.7)
PLATELETS: 235 10*3/uL (ref 150–400)
RBC: 4.07 MIL/uL — AB (ref 4.22–5.81)
RDW: 13.2 % (ref 11.5–15.5)
WBC: 12.5 10*3/uL — AB (ref 4.0–10.5)

## 2016-02-03 LAB — COMPREHENSIVE METABOLIC PANEL
ALBUMIN: 3.4 g/dL — AB (ref 3.5–5.0)
ALT: 97 U/L — AB (ref 17–63)
ANION GAP: 9 (ref 5–15)
AST: 29 U/L (ref 15–41)
Alkaline Phosphatase: 112 U/L (ref 38–126)
BUN: 12 mg/dL (ref 6–20)
CALCIUM: 8.4 mg/dL — AB (ref 8.9–10.3)
CO2: 26 mmol/L (ref 22–32)
CREATININE: 0.91 mg/dL (ref 0.61–1.24)
Chloride: 101 mmol/L (ref 101–111)
GFR calc non Af Amer: >60 mL/min (ref >60)
Glucose, Bld: 110 mg/dL — ABNORMAL HIGH (ref 65–99)
Potassium: 4 mmol/L (ref 3.5–5.1)
SODIUM: 136 mmol/L (ref 135–145)
TOTAL PROTEIN: 7.5 g/dL (ref 6.5–8.1)
Total Bilirubin: 2.1 mg/dL — ABNORMAL HIGH (ref 0.3–1.2)

## 2016-02-03 LAB — GLUCOSE, CAPILLARY
GLUCOSE-CAPILLARY: 97 mg/dL (ref 65–99)
GLUCOSE-CAPILLARY: 102 mg/dL — AB (ref 65–99)
GLUCOSE-CAPILLARY: 129 mg/dL — AB (ref 65–99)
Glucose-Capillary: 113 mg/dL — ABNORMAL HIGH (ref 65–99)
Glucose-Capillary: 125 mg/dL — ABNORMAL HIGH (ref 65–99)
Glucose-Capillary: 129 mg/dL — ABNORMAL HIGH (ref 65–99)

## 2016-02-03 LAB — SURGICAL PCR SCREEN
MRSA, PCR: NEGATIVE
Staphylococcus aureus: NEGATIVE

## 2016-02-03 LAB — LIPASE, BLOOD: LIPASE: 16 U/L (ref 11–51)

## 2016-02-03 NOTE — Progress Notes (Signed)
Patient ID: [name] [name], male   DOB: 01-08-1951, 65 y.o.   MRN: [id number] [facility] Surgery Progress Note:   1 Day Post-Op  Subjective: Mental status is alert.  Pain is better this am.   Objective: Vital signs in last 24 hours: Temp:  [97.7 F (36.5 C)-99.5 F (37.5 C)] 98.7 F (37.1 C) (07/29 0402) Pulse Rate:  [77-97] 88 (07/29 0402) Resp:  [12-32] 20 (07/29 0402) BP: (136-185)/(74-88) 148/80 (07/29 0402) SpO2:  [94 %-100 %] 97 % (07/29 0402) Weight:  [122.5 kg (270 lb)-132.3 kg (291 lb 9.6 oz)] 132.3 kg (291 lb 9.6 oz) (07/29 0402)  Intake/Output from previous day: 07/28 0701 - 07/29 0700 In: 1110 [I.V.:1110] Out: 402 [Urine:401; Stool:1] Intake/Output this shift: No intake/output data recorded.  Physical Exam: Work of breathing is normal;  Abdomen is obese, no rebound or guarding  Lab Results:  Results for orders placed or performed during the hospital encounter of 01/31/16 (from the past 48 hour(s))  Glucose, capillary     Status: Abnormal   Collection Time: 02/01/16 11:45 AM  Result Value Ref Range   Glucose-Capillary 103 (H) 65 - 99 mg/dL  Glucose, capillary     Status: Abnormal   Collection Time: 02/01/16  4:47 PM  Result Value Ref Range   Glucose-Capillary 103 (H) 65 - 99 mg/dL  Urinalysis, Routine w reflex microscopic     Status: Abnormal   Collection Time: 02/01/16  5:00 PM  Result Value Ref Range   Color, Urine AMBER (A) YELLOW    Comment: BIOCHEMICALS MAY BE AFFECTED BY COLOR   APPearance CLOUDY (A) CLEAR   Specific Gravity, Urine 1.039 (H) 1.005 - 1.030   pH 7.0 5.0 - 8.0   Glucose, UA NEGATIVE NEGATIVE mg/dL   Hgb urine dipstick NEGATIVE NEGATIVE   Bilirubin Urine NEGATIVE NEGATIVE   Ketones, ur NEGATIVE NEGATIVE mg/dL   Protein, ur NEGATIVE NEGATIVE mg/dL   Nitrite NEGATIVE NEGATIVE   Leukocytes, UA NEGATIVE NEGATIVE    Comment: MICROSCOPIC NOT DONE ON URINES WITH NEGATIVE PROTEIN, BLOOD, LEUKOCYTES, NITRITE, OR GLUCOSE <1000 mg/dL.   Glucose, capillary     Status: Abnormal   Collection Time: 02/01/16  9:17 PM  Result Value Ref Range   Glucose-Capillary 120 (H) 65 - 99 mg/dL   Comment 1 Notify RN   Glucose, capillary     Status: Abnormal   Collection Time: 02/02/16 12:53 AM  Result Value Ref Range   Glucose-Capillary 138 (H) 65 - 99 mg/dL   Comment 1 Notify RN   Comprehensive metabolic panel     Status: Abnormal   Collection Time: 02/02/16  4:19 AM  Result Value Ref Range   Sodium 136 135 - 145 mmol/L   Potassium 3.6 3.5 - 5.1 mmol/L   Chloride 105 101 - 111 mmol/L   CO2 24 22 - 32 mmol/L   Glucose, Bld 102 (H) 65 - 99 mg/dL   BUN 10 6 - 20 mg/dL   Creatinine, Ser 0.96 0.61 - 1.24 mg/dL   Calcium 8.4 (L) 8.9 - 10.3 mg/dL   Total Protein 7.3 6.5 - 8.1 g/dL   Albumin 3.5 3.5 - 5.0 g/dL   AST 58 (H) 15 - 41 U/L   ALT 155 (H) 17 - 63 U/L   Alkaline Phosphatase 135 (H) 38 - 126 U/L   Total Bilirubin 2.6 (H) 0.3 - 1.2 mg/dL   GFR calc non Af Amer >60 >60 mL/min   GFR calc Af Amer >60 >60 mL/min  Comment: (NOTE) The eGFR has been calculated using the CKD EPI equation. This calculation has not been validated in all clinical situations. eGFR's persistently <60 mL/min signify possible Chronic Kidney Disease.    Anion gap 7 5 - 15  CBC with Differential/Platelet     Status: Abnormal   Collection Time: 02/02/16  4:19 AM  Result Value Ref Range   WBC 11.7 (H) 4.0 - 10.5 K/uL   RBC 4.24 4.22 - 5.81 MIL/uL   Hemoglobin 13.1 13.0 - 17.0 g/dL   HCT 39.2 39.0 - 52.0 %   MCV 92.5 78.0 - 100.0 fL   MCH 30.9 26.0 - 34.0 pg   MCHC 33.4 30.0 - 36.0 g/dL   RDW 13.7 11.5 - 15.5 %   Platelets 232 150 - 400 K/uL   Neutrophils Relative % 79 %   Neutro Abs 9.2 (H) 1.7 - 7.7 K/uL   Lymphocytes Relative 14 %   Lymphs Abs 1.6 0.7 - 4.0 K/uL   Monocytes Relative 7 %   Monocytes Absolute 0.9 0.1 - 1.0 K/uL   Eosinophils Relative 0 %   Eosinophils Absolute 0.0 0.0 - 0.7 K/uL   Basophils Relative 0 %   Basophils Absolute  0.0 0.0 - 0.1 K/uL  Glucose, capillary     Status: Abnormal   Collection Time: 02/02/16  5:42 AM  Result Value Ref Range   Glucose-Capillary 122 (H) 65 - 99 mg/dL  Glucose, capillary     Status: Abnormal   Collection Time: 02/02/16  8:21 AM  Result Value Ref Range   Glucose-Capillary 132 (H) 65 - 99 mg/dL   Comment 1 Notify RN    Comment 2 Document in Chart   Glucose, capillary     Status: Abnormal   Collection Time: 02/02/16  1:06 PM  Result Value Ref Range   Glucose-Capillary 122 (H) 65 - 99 mg/dL  Glucose, capillary     Status: Abnormal   Collection Time: 02/02/16  4:36 PM  Result Value Ref Range   Glucose-Capillary 158 (H) 65 - 99 mg/dL   Comment 1 Notify RN    Comment 2 Document in Chart   Glucose, capillary     Status: Abnormal   Collection Time: 02/02/16  8:14 PM  Result Value Ref Range   Glucose-Capillary 107 (H) 65 - 99 mg/dL   Comment 1 Notify RN   Glucose, capillary     Status: Abnormal   Collection Time: 02/02/16 11:58 PM  Result Value Ref Range   Glucose-Capillary 110 (H) 65 - 99 mg/dL   Comment 1 Notify RN   Glucose, capillary     Status: None   Collection Time: 02/03/16  4:01 AM  Result Value Ref Range   Glucose-Capillary 97 65 - 99 mg/dL   Comment 1 Notify RN   Glucose, capillary     Status: Abnormal   Collection Time: 02/03/16  7:37 AM  Result Value Ref Range   Glucose-Capillary 102 (H) 65 - 99 mg/dL    Radiology/Results: Mr 3d Recon At Scanner  Result Date: 02/01/2016 CLINICAL DATA:  65 year old male with abdominal pain and elevated bili Rubin. Possible dilatation of the pancreatic duct noted on prior ultrasound examination. EXAM: MRI ABDOMEN WITHOUT AND WITH CONTRAST (INCLUDING MRCP) TECHNIQUE: Multiplanar multisequence MR imaging of the abdomen was performed both before and after the administration of intravenous contrast. Heavily T2-weighted images of the biliary and pancreatic ducts were obtained, and three-dimensional MRCP images were rendered by  post processing. CONTRAST:  17m MULTIHANCE  GADOBENATE DIMEGLUMINE 529 MG/ML IV SOLN COMPARISON:  No prior abdominal MRI. Abdominal ultrasound 02/01/2016. FINDINGS: Lower chest:  Mild cardiomegaly. Hepatobiliary: Diffuse loss of signal intensity throughout the hepatic parenchyma on out of phase dual echo images. No suspicious cystic or solid hepatic lesions. No intrahepatic biliary ductal dilatation. MRCP images are limited by extensive patient respiratory motion. With these limitations in mind, there are some filling defects within the gallbladder, compatible with gallstones. Common bile duct does appear mildly dilated measuring 8 mm in the porta hepatis. There is a suggestion of some filling defects in the common bile duct on MRCP images (image 79 of series 5), which could indicate choledocholithiasis, however, this cannot be corroborated reliably on additional pulse sequences. Pancreas: No pancreatic mass. No pancreatic ductal dilatation on MRCP images. No pancreatic or peripancreatic fluid or inflammatory changes. Spleen: Unremarkable. Adrenals/Urinary Tract: Bilateral kidneys and bilateral adrenal glands are normal in appearance. No hydroureteronephrosis in the visualized abdomen. Stomach/Bowel: Visualized portions are unremarkable. Vascular/Lymphatic: No aneurysm identified in the visualized abdominal vasculature. No lymphadenopathy noted in the abdomen. Other: No significant volume of ascites in the visualized peritoneal cavity. Musculoskeletal: No aggressive osseous lesions are noted in the visualized portions of the skeleton. IMPRESSION: 1. No pancreatic ductal dilatation. 2. Mild common bile duct dilatation (8 mm in diameter). There is a suggestion of filling defects in the common bile duct, which is concerning for potential choledocholithiasis. At this time, there is no intrahepatic biliary ductal dilatation to suggest frank biliary tract obstruction. Today's study was slightly limited by extensive  patient motion. Correlation with ERCP may be warranted if clinically appropriate. 3. Cholelithiasis without evidence of acute cholecystitis. Hepatic steatosis. 4. Additional incidental findings, as above. Electronically Signed   By: Vinnie Langton M.D.   On: 02/01/2016 14:46  Dg Ercp Biliary & Pancreatic Ducts  Result Date: 02/02/2016 CLINICAL DATA:  65 year old male with choledocholithiasis EXAM: ERCP TECHNIQUE: Multiple spot images obtained with the fluoroscopic device and submitted for interpretation post-procedure. FLUOROSCOPY TIME:  Please see GI operative note for detail COMPARISON:  MRCP 02/01/2016 FINDINGS: Fluoroscopic images demonstrate a flexible endoscope in the descending duodenum and cannulation of the common bile duct. Subsequent images demonstrate mild biliary ductal dilatation. Question of filling defects in the distal common bile duct consistent with choledocholithiasis. Subsequent images document sphincterotomy and balloon sweep of the common duct. IMPRESSION: 1. Mild biliary ductal dilatation with probable choledocholithiasis. 2. The images demonstrate sphincterotomy and balloon sweep of the common duct. These images were submitted for radiologic interpretation only. Please see the procedural report for the amount of contrast and the fluoroscopy time utilized. Electronically Signed   By: Jacqulynn Cadet M.D.   On: 02/02/2016 13:15  Mr Jeananne Rama W/wo Cm/mrcp  Result Date: 02/01/2016 CLINICAL DATA:  65 year old male with abdominal pain and elevated bili Rubin. Possible dilatation of the pancreatic duct noted on prior ultrasound examination. EXAM: MRI ABDOMEN WITHOUT AND WITH CONTRAST (INCLUDING MRCP) TECHNIQUE: Multiplanar multisequence MR imaging of the abdomen was performed both before and after the administration of intravenous contrast. Heavily T2-weighted images of the biliary and pancreatic ducts were obtained, and three-dimensional MRCP images were rendered by post processing.  CONTRAST:  45m MULTIHANCE GADOBENATE DIMEGLUMINE 529 MG/ML IV SOLN COMPARISON:  No prior abdominal MRI. Abdominal ultrasound 02/01/2016. FINDINGS: Lower chest:  Mild cardiomegaly. Hepatobiliary: Diffuse loss of signal intensity throughout the hepatic parenchyma on out of phase dual echo images. No suspicious cystic or solid hepatic lesions. No intrahepatic biliary ductal dilatation. MRCP images are limited  by extensive patient respiratory motion. With these limitations in mind, there are some filling defects within the gallbladder, compatible with gallstones. Common bile duct does appear mildly dilated measuring 8 mm in the porta hepatis. There is a suggestion of some filling defects in the common bile duct on MRCP images (image 79 of series 5), which could indicate choledocholithiasis, however, this cannot be corroborated reliably on additional pulse sequences. Pancreas: No pancreatic mass. No pancreatic ductal dilatation on MRCP images. No pancreatic or peripancreatic fluid or inflammatory changes. Spleen: Unremarkable. Adrenals/Urinary Tract: Bilateral kidneys and bilateral adrenal glands are normal in appearance. No hydroureteronephrosis in the visualized abdomen. Stomach/Bowel: Visualized portions are unremarkable. Vascular/Lymphatic: No aneurysm identified in the visualized abdominal vasculature. No lymphadenopathy noted in the abdomen. Other: No significant volume of ascites in the visualized peritoneal cavity. Musculoskeletal: No aggressive osseous lesions are noted in the visualized portions of the skeleton. IMPRESSION: 1. No pancreatic ductal dilatation. 2. Mild common bile duct dilatation (8 mm in diameter). There is a suggestion of filling defects in the common bile duct, which is concerning for potential choledocholithiasis. At this time, there is no intrahepatic biliary ductal dilatation to suggest frank biliary tract obstruction. Today's study was slightly limited by extensive patient motion.  Correlation with ERCP may be warranted if clinically appropriate. 3. Cholelithiasis without evidence of acute cholecystitis. Hepatic steatosis. 4. Additional incidental findings, as above. Electronically Signed   By: Vinnie Langton M.D.   On: 02/01/2016 14:46   Anti-infectives: Anti-infectives    Start     Dose/Rate Route Frequency Ordered Stop   02/01/16 1400  piperacillin-tazobactam (ZOSYN) IVPB 3.375 g     3.375 g 12.5 mL/hr over 240 Minutes Intravenous Every 8 hours 02/01/16 0516     02/01/16 0530  piperacillin-tazobactam (ZOSYN) IVPB 3.375 g     3.375 g 100 mL/hr over 30 Minutes Intravenous  Once 02/01/16 0516 02/01/16 0981      Assessment/Plan: Problem List: Patient Active Problem List   Diagnosis Date Noted  . Abnormal computed tomography of pancreas or bile duct   . Dilated bile duct   . Cholelithiasis 02/01/2016  . Elevated liver enzymes   . Cholecystitis, acute   . Metabolic syndrome 19/14/7829  . Hyperlipidemia 09/24/2014  . History of gout 07/04/2013  . Low serum testosterone level 07/04/2013  . GE reflux 07/06/2012  . Type 2 diabetes mellitus ([place]) 03/09/2012  . History of esophageal stricture 03/09/2012  . Osteoarthritis of both knees 03/09/2012  . Obesity 03/09/2012  . Erectile dysfunction 03/02/2012  . Hypertension 03/02/2012    Cholecystitis post ERCP yesterday;  Will check labs today and have posted for lap chole in the morning.   1 Day Post-Op    LOS: 2 days   Matt B. Hassell Done, MD, [facility] [phone] beeper [phone]  02/03/2016 8:45 AM

## 2016-02-03 NOTE — Progress Notes (Signed)
[facility] Progress Note  Subjective:  ERCP without stones.  Has pain but no more than previously.    Objective:  Vital signs in last 24 hours: Temp:  [97.7 F (36.5 C)-99.5 F (37.5 C)] 98.7 F (37.1 C) (07/29 0402) Pulse Rate:  [77-97] 88 (07/29 0402) Resp:  [12-32] 20 (07/29 0402) BP: (136-185)/(74-88) 148/80 (07/29 0402) SpO2:  [94 %-100 %] 97 % (07/29 0402) Weight:  [270 lb (122.5 kg)-291 lb 9.6 oz (132.3 kg)] 291 lb 9.6 oz (132.3 kg) (07/29 0402) Last BM Date: 01/31/16 General:  Alert, Well-developed, in NAD Heart:  Regular rate and rhythm; no murmurs Pulm:  CTAB.  No W/R/R. Abdomen:  Soft, non-distended.  BS present.  RUQ TTP.  Extremities:  Without edema. Neurologic:  Alert and oriented x 4;  grossly normal neurologically. Psych:  Alert and cooperative. Normal mood and affect.  Intake/Output from previous day: 07/28 0701 - 07/29 0700 In: 1110 [I.V.:1110] Out: 402 [Urine:401; Stool:1] Intake/Output this shift: No intake/output data recorded.  Lab Results:  Recent Labs  02/01/16 0550 02/02/16 0419 02/03/16 0846  WBC 6.5 11.7* 12.5*  HGB 13.3 13.1 12.8*  HCT 39.7 39.2 37.7*  PLT 251 232 235   BMET  Recent Labs  02/01/16 0550 02/02/16 0419 02/03/16 0846  NA 136 136 136  K 4.0 3.6 4.0  CL 106 105 101  CO2 24 24 26   GLUCOSE 126* 102* 110*  BUN 10 10 12   CREATININE 0.84 0.96 0.91  CALCIUM 8.5* 8.4* 8.4*   LFT  Recent Labs  02/01/16 0550  02/03/16 0846  PROT 7.2  < > 7.5  ALBUMIN 3.9  < > 3.4*  AST 177*  < > 29  ALT 260*  < > 97*  ALKPHOS 163*  < > 112  BILITOT 2.2*  < > 2.1*  BILIDIR 0.7*  --   --   IBILI 1.5*  --   --   < > = values in this interval not displayed. Hepatitis Panel  Recent Labs  02/01/16 0550  HEPBSAG Negative  HCVAB <0.1  HEPAIGM Negative  HEPBIGM Negative    Mr 3d Recon At Scanner  Result Date: 02/01/2016 CLINICAL DATA:  65 year old male with abdominal pain and elevated bili Rubin. Possible  dilatation of the pancreatic duct noted on prior ultrasound examination. EXAM: MRI ABDOMEN WITHOUT AND WITH CONTRAST (INCLUDING MRCP) TECHNIQUE: Multiplanar multisequence MR imaging of the abdomen was performed both before and after the administration of intravenous contrast. Heavily T2-weighted images of the biliary and pancreatic ducts were obtained, and three-dimensional MRCP images were rendered by post processing. CONTRAST:  5mL MULTIHANCE GADOBENATE DIMEGLUMINE 529 MG/ML IV SOLN COMPARISON:  No prior abdominal MRI. Abdominal ultrasound 02/01/2016. FINDINGS: Lower chest:  Mild cardiomegaly. Hepatobiliary: Diffuse loss of signal intensity throughout the hepatic parenchyma on out of phase dual echo images. No suspicious cystic or solid hepatic lesions. No intrahepatic biliary ductal dilatation. MRCP images are limited by extensive patient respiratory motion. With these limitations in mind, there are some filling defects within the gallbladder, compatible with gallstones. Common bile duct does appear mildly dilated measuring 8 mm in the porta hepatis. There is a suggestion of some filling defects in the common bile duct on MRCP images (image 79 of series 5), which could indicate choledocholithiasis, however, this cannot be corroborated reliably on additional pulse sequences. Pancreas: No pancreatic mass. No pancreatic ductal dilatation on MRCP images. No pancreatic or peripancreatic fluid or inflammatory changes. Spleen: Unremarkable. Adrenals/Urinary Tract: Bilateral  kidneys and bilateral adrenal glands are normal in appearance. No hydroureteronephrosis in the visualized abdomen. Stomach/Bowel: Visualized portions are unremarkable. Vascular/Lymphatic: No aneurysm identified in the visualized abdominal vasculature. No lymphadenopathy noted in the abdomen. Other: No significant volume of ascites in the visualized peritoneal cavity. Musculoskeletal: No aggressive osseous lesions are noted in the visualized portions  of the skeleton. IMPRESSION: 1. No pancreatic ductal dilatation. 2. Mild common bile duct dilatation (8 mm in diameter). There is a suggestion of filling defects in the common bile duct, which is concerning for potential choledocholithiasis. At this time, there is no intrahepatic biliary ductal dilatation to suggest frank biliary tract obstruction. Today's study was slightly limited by extensive patient motion. Correlation with ERCP may be warranted if clinically appropriate. 3. Cholelithiasis without evidence of acute cholecystitis. Hepatic steatosis. 4. Additional incidental findings, as above. Electronically Signed   By: Vinnie Langton M.D.   On: 02/01/2016 14:46  Dg Ercp Biliary & Pancreatic Ducts  Result Date: 02/02/2016 CLINICAL DATA:  65 year old male with choledocholithiasis EXAM: ERCP TECHNIQUE: Multiple spot images obtained with the fluoroscopic device and submitted for interpretation post-procedure. FLUOROSCOPY TIME:  Please see GI operative note for detail COMPARISON:  MRCP 02/01/2016 FINDINGS: Fluoroscopic images demonstrate a flexible endoscope in the descending duodenum and cannulation of the common bile duct. Subsequent images demonstrate mild biliary ductal dilatation. Question of filling defects in the distal common bile duct consistent with choledocholithiasis. Subsequent images document sphincterotomy and balloon sweep of the common duct. IMPRESSION: 1. Mild biliary ductal dilatation with probable choledocholithiasis. 2. The images demonstrate sphincterotomy and balloon sweep of the common duct. These images were submitted for radiologic interpretation only. Please see the procedural report for the amount of contrast and the fluoroscopy time utilized. Electronically Signed   By: Jacqulynn Cadet M.D.   On: 02/02/2016 13:15  Mr Jeananne Rama W/wo Cm/mrcp  Result Date: 02/01/2016 CLINICAL DATA:  65 year old male with abdominal pain and elevated bili Rubin. Possible dilatation of the pancreatic  duct noted on prior ultrasound examination. EXAM: MRI ABDOMEN WITHOUT AND WITH CONTRAST (INCLUDING MRCP) TECHNIQUE: Multiplanar multisequence MR imaging of the abdomen was performed both before and after the administration of intravenous contrast. Heavily T2-weighted images of the biliary and pancreatic ducts were obtained, and three-dimensional MRCP images were rendered by post processing. CONTRAST:  5mL MULTIHANCE GADOBENATE DIMEGLUMINE 529 MG/ML IV SOLN COMPARISON:  No prior abdominal MRI. Abdominal ultrasound 02/01/2016. FINDINGS: Lower chest:  Mild cardiomegaly. Hepatobiliary: Diffuse loss of signal intensity throughout the hepatic parenchyma on out of phase dual echo images. No suspicious cystic or solid hepatic lesions. No intrahepatic biliary ductal dilatation. MRCP images are limited by extensive patient respiratory motion. With these limitations in mind, there are some filling defects within the gallbladder, compatible with gallstones. Common bile duct does appear mildly dilated measuring 8 mm in the porta hepatis. There is a suggestion of some filling defects in the common bile duct on MRCP images (image 79 of series 5), which could indicate choledocholithiasis, however, this cannot be corroborated reliably on additional pulse sequences. Pancreas: No pancreatic mass. No pancreatic ductal dilatation on MRCP images. No pancreatic or peripancreatic fluid or inflammatory changes. Spleen: Unremarkable. Adrenals/Urinary Tract: Bilateral kidneys and bilateral adrenal glands are normal in appearance. No hydroureteronephrosis in the visualized abdomen. Stomach/Bowel: Visualized portions are unremarkable. Vascular/Lymphatic: No aneurysm identified in the visualized abdominal vasculature. No lymphadenopathy noted in the abdomen. Other: No significant volume of ascites in the visualized peritoneal cavity. Musculoskeletal: No aggressive osseous lesions are noted in  the visualized portions of the skeleton. IMPRESSION:  1. No pancreatic ductal dilatation. 2. Mild common bile duct dilatation (8 mm in diameter). There is a suggestion of filling defects in the common bile duct, which is concerning for potential choledocholithiasis. At this time, there is no intrahepatic biliary ductal dilatation to suggest frank biliary tract obstruction. Today's study was slightly limited by extensive patient motion. Correlation with ERCP may be warranted if clinically appropriate. 3. Cholelithiasis without evidence of acute cholecystitis. Hepatic steatosis. 4. Additional incidental findings, as above. Electronically Signed   By: Vinnie Langton M.D.   On: 02/01/2016 14:46  Assessment / Plan: -65 year-old male with sudden onset abdominal pain, newly elevated LFTs, and ultrasound showing gallstones and dilation of pancreatic duct.  MRCP suggested filling defects in CBD-->ERCP with sphincterotomy 7/28 was negative.  On IV antibiotics.  LFT's trending down.  Hopefully to OR today for cholecystectomy per surgery.   LOS: 2 days   Amyria Komar D.  02/03/2016, 9:53 AM  Pager number [phone]

## 2016-02-03 NOTE — Progress Notes (Signed)
PROGRESS NOTE    [name] [name]  [id number] DOB: 26-Aug-1950 DOA: 01/31/2016 PCP: Elby Showers, MD     Brief Narrative: HPI as per Dr. Hal Hope: HPI: [name] [name] is a 65 y.o. male with history of diabetes mellitus type 2, hypertension, hyperlipidemia presents to the ER because of persistent abdominal pain since yesterday morning. Patient's pain is mostly in the epigastric area. Denies any radiation. Denies any associated shortness of breath or chest pain. Denies any nausea vomiting but has been having at least 5 episodes of loose stools. In the ER patient's labs show elevated LFTs with sonogram showing gallstones and dilated pancreatic duct. Patient denies any fever or chills. Patient is being admitted for further management of abdominal pain with gallstones and possible obstruction in the pancreatic duct.   ED Course: Was given pain relief medications. Has sonogram as discussed above. With labs showing elevated LFTs.  Since admission, patient's labs have trended down.  He remains febrile to 100.7 but generally has adequate pain control.  MRCP appears to show obstructing stone however ERCP not significant for stones. General surgery to take patient for cholecystectomy on July 30  Assessment & Plan:   Principal Problem:   Cholecystitis, acute Active Problems:   Hypertension   Type 2 diabetes mellitus (HCC)   Cholelithiasis   Elevated liver enzymes   Abnormal computed tomography of pancreas or bile duct   Dilated bile duct  Acute cholecystitis with cholelithiasis -Persistently elevated WBC count with fever -On Zosyn (7/27 >> -For cholecystectomy by Gen Surg tomorrow (7/30) -Currently adequate pain control -Improving LFTs although bilirubin is stable  HTN -Suboptimal control -May be pain related -If ongoing/worsening, consider IV options -Home medications: Norvasc, Lisinopril are currently held secondary to recent nothing by mouth status. We'll continue IV when  necessary since patient is going for surgery tomorrow  DM - No hypoglycemia - Maximum blood glucose of 129 last 24 hours -Continue to follow with SSI     DVT prophylaxis: SCDs. Code Status: Full code.  Family Communication: Discussed with patient.  Disposition Plan: Home.  Admission status: Inpatient. MedSurg.   Consultants:   General Surgery  GI  Procedures:  MRCP  ERCP (7/28)  Upcoming: Cholecystectomy  Antimicrobials:   Zosyn, started 7/27   Subjective: Patient states he has no concerns overnight. Pain is adequately controlled. He will start trying clear liquids today.  Objective: Vitals:   02/02/16 1315 02/02/16 1414 02/02/16 2110 02/03/16 0402  BP: (!) 150/80  136/78 (!) 148/80  Pulse: 90  77 88  Resp:   20 20  Temp:  97.7 F (36.5 C) 98.3 F (36.8 C) 98.7 F (37.1 C)  TempSrc:   Oral Oral  SpO2: 99%  96% 97%  Weight:    132.3 kg (291 lb 9.6 oz)  Height:        Intake/Output Summary (Last 24 hours) at 02/03/16 1321 Last data filed at 02/03/16 0434  Gross per 24 hour  Intake              110 ml  Output              402 ml  Net             -292 ml   Filed Weights   02/01/16 1804 02/02/16 1010 02/03/16 0402  Weight: 122.5 kg (270 lb) 122.5 kg (270 lb) 132.3 kg (291 lb 9.6 oz)    Examination:  General exam: Appears calm and comfortable  Respiratory system: Clear  to auscultation. Respiratory effort normal. Cardiovascular system: S1 & S2 heard, RRR. No JVD, murmurs, rubs, gallops or clicks. No pedal edema. Gastrointestinal system: Abdomen is nondistended, soft and tender in epigastric and right upper quadrant region. No organomegaly or masses felt. Normal bowel sounds heard. Central nervous system: Alert and oriented. No focal neurological deficits. Extremities: Symmetric 5 x 5 power. Skin: No rashes, lesions or ulcers Psychiatry: Judgement and insight appear normal. Mood & affect appropriate.     Data Reviewed: I have personally reviewed  following labs and imaging studies  CBC:  Recent Labs Lab 01/31/16 2346 02/01/16 0550 02/02/16 0419 02/03/16 0846  WBC 5.5 6.5 11.7* 12.5*  NEUTROABS 3.1 5.2 9.2* 10.1*  HGB 13.9 13.3 13.1 12.8*  HCT 41.7 39.7 39.2 37.7*  MCV 92.5 92.1 92.5 92.6  PLT 260 251 232 [id number]   Basic Metabolic Panel:  Recent Labs Lab 01/31/16 2346 02/01/16 0550 02/02/16 0419 02/03/16 0846  NA 138 136 136 136  K 3.6 4.0 3.6 4.0  CL 105 106 105 101  CO2 27 24 24 26   GLUCOSE 143* 126* 102* 110*  BUN 11 10 10 12   CREATININE 0.86 0.84 0.96 0.91  CALCIUM 9.0 8.5* 8.4* 8.4*   GFR: Estimated Creatinine Clearance: 113.6 mL/min (by C-G formula based on SCr of 0.91 mg/dL). Liver Function Tests:  Recent Labs Lab 01/31/16 2346 02/01/16 0550 02/02/16 0419 02/03/16 0846  AST 278* 177* 58* 29  ALT 295* 260* 155* 97*  ALKPHOS 165* 163* 135* 112  BILITOT 3.3* 2.2* 2.6* 2.1*  PROT 7.8 7.2 7.3 7.5  ALBUMIN 4.1 3.9 3.5 3.4*    Recent Labs Lab 01/31/16 2346 02/01/16 0550 02/03/16 0846  LIPASE 18 21 16    No results for input(s): AMMONIA in the last 168 hours. Coagulation Profile: No results for input(s): INR, PROTIME in the last 168 hours. Cardiac Enzymes: No results for input(s): CKTOTAL, CKMB, CKMBINDEX, TROPONINI in the last 168 hours. BNP (last 3 results) No results for input(s): PROBNP in the last 8760 hours. HbA1C: No results for input(s): HGBA1C in the last 72 hours. CBG:  Recent Labs Lab 02/02/16 2014 02/02/16 2358 02/03/16 0401 02/03/16 0737 02/03/16 1152  GLUCAP 107* 110* 97 102* 129*   Lipid Profile: No results for input(s): CHOL, HDL, LDLCALC, TRIG, CHOLHDL, LDLDIRECT in the last 72 hours. Thyroid Function Tests: No results for input(s): TSH, T4TOTAL, FREET4, T3FREE, THYROIDAB in the last 72 hours. Anemia Panel: No results for input(s): VITAMINB12, FOLATE, FERRITIN, TIBC, IRON, RETICCTPCT in the last 72 hours. Urine analysis:    Component Value Date/Time   COLORURINE  AMBER (A) 02/01/2016 1700   APPEARANCEUR CLOUDY (A) 02/01/2016 1700   LABSPEC 1.039 (H) 02/01/2016 1700   PHURINE 7.0 02/01/2016 1700   GLUCOSEU NEGATIVE 02/01/2016 1700   HGBUR NEGATIVE 02/01/2016 1700   BILIRUBINUR NEGATIVE 02/01/2016 1700   BILIRUBINUR neg 02/27/2015 1021   KETONESUR NEGATIVE 02/01/2016 1700   PROTEINUR NEGATIVE 02/01/2016 1700   UROBILINOGEN negative 02/27/2015 1021   NITRITE NEGATIVE 02/01/2016 1700   LEUKOCYTESUR NEGATIVE 02/01/2016 1700   Sepsis Labs: @LABRCNTIP (procalcitonin:4,lacticidven:4)  )No results found for this or any previous visit (from the past 240 hour(s)).       Radiology Studies: Mr 3d Recon At Scanner  Result Date: 02/01/2016 CLINICAL DATA:  65 year old male with abdominal pain and elevated bili Rubin. Possible dilatation of the pancreatic duct noted on prior ultrasound examination. EXAM: MRI ABDOMEN WITHOUT AND WITH CONTRAST (INCLUDING MRCP) TECHNIQUE: Multiplanar multisequence MR imaging of the  abdomen was performed both before and after the administration of intravenous contrast. Heavily T2-weighted images of the biliary and pancreatic ducts were obtained, and three-dimensional MRCP images were rendered by post processing. CONTRAST:  60mL MULTIHANCE GADOBENATE DIMEGLUMINE 529 MG/ML IV SOLN COMPARISON:  No prior abdominal MRI. Abdominal ultrasound 02/01/2016. FINDINGS: Lower chest:  Mild cardiomegaly. Hepatobiliary: Diffuse loss of signal intensity throughout the hepatic parenchyma on out of phase dual echo images. No suspicious cystic or solid hepatic lesions. No intrahepatic biliary ductal dilatation. MRCP images are limited by extensive patient respiratory motion. With these limitations in mind, there are some filling defects within the gallbladder, compatible with gallstones. Common bile duct does appear mildly dilated measuring 8 mm in the porta hepatis. There is a suggestion of some filling defects in the common bile duct on MRCP images (image  79 of series 5), which could indicate choledocholithiasis, however, this cannot be corroborated reliably on additional pulse sequences. Pancreas: No pancreatic mass. No pancreatic ductal dilatation on MRCP images. No pancreatic or peripancreatic fluid or inflammatory changes. Spleen: Unremarkable. Adrenals/Urinary Tract: Bilateral kidneys and bilateral adrenal glands are normal in appearance. No hydroureteronephrosis in the visualized abdomen. Stomach/Bowel: Visualized portions are unremarkable. Vascular/Lymphatic: No aneurysm identified in the visualized abdominal vasculature. No lymphadenopathy noted in the abdomen. Other: No significant volume of ascites in the visualized peritoneal cavity. Musculoskeletal: No aggressive osseous lesions are noted in the visualized portions of the skeleton. IMPRESSION: 1. No pancreatic ductal dilatation. 2. Mild common bile duct dilatation (8 mm in diameter). There is a suggestion of filling defects in the common bile duct, which is concerning for potential choledocholithiasis. At this time, there is no intrahepatic biliary ductal dilatation to suggest frank biliary tract obstruction. Today's study was slightly limited by extensive patient motion. Correlation with ERCP may be warranted if clinically appropriate. 3. Cholelithiasis without evidence of acute cholecystitis. Hepatic steatosis. 4. Additional incidental findings, as above. Electronically Signed   By: Vinnie Langton M.D.   On: 02/01/2016 14:46  Dg Ercp Biliary & Pancreatic Ducts  Result Date: 02/02/2016 CLINICAL DATA:  65 year old male with choledocholithiasis EXAM: ERCP TECHNIQUE: Multiple spot images obtained with the fluoroscopic device and submitted for interpretation post-procedure. FLUOROSCOPY TIME:  Please see GI operative note for detail COMPARISON:  MRCP 02/01/2016 FINDINGS: Fluoroscopic images demonstrate a flexible endoscope in the descending duodenum and cannulation of the common bile duct. Subsequent  images demonstrate mild biliary ductal dilatation. Question of filling defects in the distal common bile duct consistent with choledocholithiasis. Subsequent images document sphincterotomy and balloon sweep of the common duct. IMPRESSION: 1. Mild biliary ductal dilatation with probable choledocholithiasis. 2. The images demonstrate sphincterotomy and balloon sweep of the common duct. These images were submitted for radiologic interpretation only. Please see the procedural report for the amount of contrast and the fluoroscopy time utilized. Electronically Signed   By: Jacqulynn Cadet M.D.   On: 02/02/2016 13:15  Mr Jeananne Rama W/wo Cm/mrcp  Result Date: 02/01/2016 CLINICAL DATA:  65 year old male with abdominal pain and elevated bili Rubin. Possible dilatation of the pancreatic duct noted on prior ultrasound examination. EXAM: MRI ABDOMEN WITHOUT AND WITH CONTRAST (INCLUDING MRCP) TECHNIQUE: Multiplanar multisequence MR imaging of the abdomen was performed both before and after the administration of intravenous contrast. Heavily T2-weighted images of the biliary and pancreatic ducts were obtained, and three-dimensional MRCP images were rendered by post processing. CONTRAST:  21mL MULTIHANCE GADOBENATE DIMEGLUMINE 529 MG/ML IV SOLN COMPARISON:  No prior abdominal MRI. Abdominal ultrasound 02/01/2016. FINDINGS: Lower chest:  Mild cardiomegaly. Hepatobiliary: Diffuse loss of signal intensity throughout the hepatic parenchyma on out of phase dual echo images. No suspicious cystic or solid hepatic lesions. No intrahepatic biliary ductal dilatation. MRCP images are limited by extensive patient respiratory motion. With these limitations in mind, there are some filling defects within the gallbladder, compatible with gallstones. Common bile duct does appear mildly dilated measuring 8 mm in the porta hepatis. There is a suggestion of some filling defects in the common bile duct on MRCP images (image 79 of series 5), which could  indicate choledocholithiasis, however, this cannot be corroborated reliably on additional pulse sequences. Pancreas: No pancreatic mass. No pancreatic ductal dilatation on MRCP images. No pancreatic or peripancreatic fluid or inflammatory changes. Spleen: Unremarkable. Adrenals/Urinary Tract: Bilateral kidneys and bilateral adrenal glands are normal in appearance. No hydroureteronephrosis in the visualized abdomen. Stomach/Bowel: Visualized portions are unremarkable. Vascular/Lymphatic: No aneurysm identified in the visualized abdominal vasculature. No lymphadenopathy noted in the abdomen. Other: No significant volume of ascites in the visualized peritoneal cavity. Musculoskeletal: No aggressive osseous lesions are noted in the visualized portions of the skeleton. IMPRESSION: 1. No pancreatic ductal dilatation. 2. Mild common bile duct dilatation (8 mm in diameter). There is a suggestion of filling defects in the common bile duct, which is concerning for potential choledocholithiasis. At this time, there is no intrahepatic biliary ductal dilatation to suggest frank biliary tract obstruction. Today's study was slightly limited by extensive patient motion. Correlation with ERCP may be warranted if clinically appropriate. 3. Cholelithiasis without evidence of acute cholecystitis. Hepatic steatosis. 4. Additional incidental findings, as above. Electronically Signed   By: Vinnie Langton M.D.   On: 02/01/2016 14:46       Scheduled Meds: . insulin aspart  0-9 Units Subcutaneous Q4H  . piperacillin-tazobactam (ZOSYN)  IV  3.375 g Intravenous Q8H   Continuous Infusions: . sodium chloride 10 mL/hr at 02/03/16 0434     LOS: 2 days    Time spent: 25 minutes    Cordelia Poche, MD [facility]   If 7PM-7AM, please contact night-coverage [email or web address] Password [email or web address] 02/03/2016, 1:21 PM

## 2016-02-04 ENCOUNTER — Encounter (HOSPITAL_COMMUNITY)
Admission: EM | Disposition: A | Discharge status: Home / Self Care | Payer: Self-pay | Source: Home / Self Care | Attending: Family Medicine

## 2016-02-04 ENCOUNTER — Inpatient Hospital Stay (HOSPITAL_COMMUNITY): Payer: 59 | Admitting: Certified Registered Nurse Anesthetist

## 2016-02-04 HISTORY — PX: CHOLECYSTECTOMY: SHX55

## 2016-02-04 LAB — GLUCOSE, CAPILLARY
GLUCOSE-CAPILLARY: 108 mg/dL — AB (ref 65–99)
GLUCOSE-CAPILLARY: 126 mg/dL — AB (ref 65–99)
GLUCOSE-CAPILLARY: 135 mg/dL — AB (ref 65–99)
Glucose-Capillary: 141 mg/dL — ABNORMAL HIGH (ref 65–99)
Glucose-Capillary: 145 mg/dL — ABNORMAL HIGH (ref 65–99)

## 2016-02-04 SURGERY — LAPAROSCOPIC CHOLECYSTECTOMY
Anesthesia: General | Site: Abdomen

## 2016-02-04 MED ORDER — LACTATED RINGERS IR SOLN
Status: DC | PRN
  Administered 2016-02-04: 1000 mL

## 2016-02-04 MED ORDER — FENTANYL CITRATE (PF) 100 MCG/2ML IJ SOLN
INTRAMUSCULAR | Status: AC
  Filled 2016-02-04: qty 2

## 2016-02-04 MED ORDER — MEPERIDINE HCL 50 MG/ML IJ SOLN: 6.2500 mg | INTRAMUSCULAR | Status: DC | PRN

## 2016-02-04 MED ORDER — AMLODIPINE BESYLATE 10 MG PO TABS: 10.0000 mg | ORAL_TABLET | Freq: Every day | ORAL | Status: DC

## 2016-02-04 MED ORDER — BUPIVACAINE-EPINEPHRINE 0.25% -1:200000 IJ SOLN
INTRAMUSCULAR | Status: AC
  Filled 2016-02-04: qty 1

## 2016-02-04 MED ORDER — HYDROMORPHONE HCL 1 MG/ML IJ SOLN: 0.2500 mg | INTRAMUSCULAR | Status: DC | PRN

## 2016-02-04 MED ORDER — LISINOPRIL 10 MG PO TABS
10.0000 mg | ORAL_TABLET | Freq: Every day | ORAL | Status: DC
  Administered 2016-02-04 – 2016-02-07 (×4): 10 mg via ORAL
  Filled 2016-02-04 (×4): qty 1

## 2016-02-04 MED ORDER — LACTATED RINGERS IV SOLN
INTRAVENOUS | Status: DC
  Administered 2016-02-04 – 2016-02-05 (×3): via INTRAVENOUS

## 2016-02-04 MED ORDER — SUGAMMADEX SODIUM 200 MG/2ML IV SOLN
INTRAVENOUS | Status: DC | PRN
  Administered 2016-02-04: 300 mg via INTRAVENOUS

## 2016-02-04 MED ORDER — HYDROMORPHONE HCL 2 MG/ML IJ SOLN
INTRAMUSCULAR | Status: AC
  Filled 2016-02-04: qty 1

## 2016-02-04 MED ORDER — 0.9 % SODIUM CHLORIDE (POUR BTL) OPTIME
TOPICAL | Status: DC | PRN
  Administered 2016-02-04: 1000 mL

## 2016-02-04 MED ORDER — ROCURONIUM BROMIDE 100 MG/10ML IV SOLN
INTRAVENOUS | Status: DC | PRN
  Administered 2016-02-04: 50 mg via INTRAVENOUS
  Administered 2016-02-04: 10 mg via INTRAVENOUS

## 2016-02-04 MED ORDER — METOCLOPRAMIDE HCL 5 MG/ML IJ SOLN
INTRAMUSCULAR | Status: DC | PRN
  Administered 2016-02-04: 5 mg via INTRAVENOUS

## 2016-02-04 MED ORDER — FENTANYL CITRATE (PF) 100 MCG/2ML IJ SOLN
INTRAMUSCULAR | Status: DC | PRN
  Administered 2016-02-04 (×2): 50 ug via INTRAVENOUS
  Administered 2016-02-04: 25 ug via INTRAVENOUS
  Administered 2016-02-04 (×4): 50 ug via INTRAVENOUS
  Administered 2016-02-04: 25 ug via INTRAVENOUS

## 2016-02-04 MED ORDER — PROPOFOL 10 MG/ML IV BOLUS
INTRAVENOUS | Status: AC
  Filled 2016-02-04: qty 20

## 2016-02-04 MED ORDER — LACTATED RINGERS IV SOLN
INTRAVENOUS | Status: DC | PRN
  Administered 2016-02-04 (×2): via INTRAVENOUS

## 2016-02-04 MED ORDER — SUGAMMADEX SODIUM 500 MG/5ML IV SOLN
INTRAVENOUS | Status: AC
  Filled 2016-02-04: qty 5

## 2016-02-04 MED ORDER — MIDAZOLAM HCL 5 MG/5ML IJ SOLN
INTRAMUSCULAR | Status: DC | PRN
  Administered 2016-02-04: 2 mg via INTRAVENOUS

## 2016-02-04 MED ORDER — LABETALOL HCL 5 MG/ML IV SOLN
INTRAVENOUS | Status: AC
  Filled 2016-02-04: qty 4

## 2016-02-04 MED ORDER — HYDROMORPHONE HCL 1 MG/ML IJ SOLN
INTRAMUSCULAR | Status: DC | PRN
  Administered 2016-02-04 (×3): .4 mg via INTRAVENOUS

## 2016-02-04 MED ORDER — ONDANSETRON HCL 4 MG/2ML IJ SOLN
INTRAMUSCULAR | Status: DC | PRN
  Administered 2016-02-04: 4 mg via INTRAVENOUS

## 2016-02-04 MED ORDER — DEXAMETHASONE SODIUM PHOSPHATE 10 MG/ML IJ SOLN
INTRAMUSCULAR | Status: AC
  Filled 2016-02-04: qty 1

## 2016-02-04 MED ORDER — PROMETHAZINE HCL 25 MG/ML IJ SOLN: 6.2500 mg | INTRAMUSCULAR | Status: DC | PRN

## 2016-02-04 MED ORDER — PROPOFOL 10 MG/ML IV BOLUS
INTRAVENOUS | Status: DC | PRN
Start: 2016-02-04 — End: 2016-02-04
  Administered 2016-02-04: 200 mg via INTRAVENOUS

## 2016-02-04 MED ORDER — FENTANYL CITRATE (PF) 250 MCG/5ML IJ SOLN
INTRAMUSCULAR | Status: AC
  Filled 2016-02-04: qty 5

## 2016-02-04 MED ORDER — ROCURONIUM BROMIDE 100 MG/10ML IV SOLN
INTRAVENOUS | Status: AC
  Filled 2016-02-04: qty 1

## 2016-02-04 MED ORDER — LIDOCAINE HCL (CARDIAC) 20 MG/ML IV SOLN
INTRAVENOUS | Status: AC
  Filled 2016-02-04: qty 5

## 2016-02-04 MED ORDER — MIDAZOLAM HCL 2 MG/2ML IJ SOLN: 0.5000 mg | Freq: Once | INTRAMUSCULAR | Status: DC | PRN

## 2016-02-04 MED ORDER — LIDOCAINE HCL (CARDIAC) 20 MG/ML IV SOLN
INTRAVENOUS | Status: DC | PRN
  Administered 2016-02-04: 20 mg via INTRAVENOUS

## 2016-02-04 MED ORDER — SUCCINYLCHOLINE CHLORIDE 20 MG/ML IJ SOLN
INTRAMUSCULAR | Status: DC | PRN
  Administered 2016-02-04: 160 mg via INTRAVENOUS

## 2016-02-04 MED ORDER — ONDANSETRON HCL 4 MG/2ML IJ SOLN
INTRAMUSCULAR | Status: AC
  Filled 2016-02-04: qty 2

## 2016-02-04 MED ORDER — MIDAZOLAM HCL 2 MG/2ML IJ SOLN
INTRAMUSCULAR | Status: AC
  Filled 2016-02-04: qty 2

## 2016-02-04 MED ORDER — AMLODIPINE BESYLATE 10 MG PO TABS
10.0000 mg | ORAL_TABLET | Freq: Every day | ORAL | Status: DC
  Administered 2016-02-05 – 2016-02-07 (×3): 10 mg via ORAL
  Filled 2016-02-04 (×3): qty 1

## 2016-02-04 MED ORDER — IOPAMIDOL (ISOVUE-300) INJECTION 61%
INTRAVENOUS | Status: AC
  Filled 2016-02-04: qty 50

## 2016-02-04 MED ORDER — BUPIVACAINE-EPINEPHRINE 0.25% -1:200000 IJ SOLN
INTRAMUSCULAR | Status: DC | PRN
  Administered 2016-02-04: 36 mL

## 2016-02-04 MED ORDER — LABETALOL HCL 5 MG/ML IV SOLN
5.0000 mg | INTRAVENOUS | Status: DC | PRN
  Administered 2016-02-04 (×2): 5 mg via INTRAVENOUS

## 2016-02-04 MED ORDER — ATORVASTATIN CALCIUM 10 MG PO TABS
10.0000 mg | ORAL_TABLET | Freq: Every day | ORAL | Status: DC
  Administered 2016-02-04: 10 mg via ORAL
  Filled 2016-02-04: qty 1

## 2016-02-04 MED ORDER — DIPHENHYDRAMINE HCL 25 MG PO CAPS
25.0000 mg | ORAL_CAPSULE | Freq: Once | ORAL | Status: AC
  Administered 2016-02-04: 25 mg via ORAL
  Filled 2016-02-04: qty 1

## 2016-02-04 SURGICAL SUPPLY — 54 items
APPLIER CLIP 5 13 M/L LIGAMAX5 (MISCELLANEOUS) ×3
APPLIER CLIP ROT 10 11.4 M/L (STAPLE) ×3
BENZOIN TINCTURE PRP APPL 2/3 (GAUZE/BANDAGES/DRESSINGS) IMPLANT
CABLE HIGH FREQUENCY MONO STRZ (ELECTRODE) ×3 IMPLANT
CATH REDDICK CHOLANGI 4FR 50CM (CATHETERS) IMPLANT
CHLORAPREP W/TINT 26ML (MISCELLANEOUS) ×3 IMPLANT
CLIP APPLIE 5 13 M/L LIGAMAX5 (MISCELLANEOUS) ×2 IMPLANT
CLIP APPLIE ROT 10 11.4 M/L (STAPLE) ×2 IMPLANT
COVER MAYO STAND STRL (DRAPES) ×3 IMPLANT
COVER SURGICAL LIGHT HANDLE (MISCELLANEOUS) IMPLANT
DECANTER SPIKE VIAL GLASS SM (MISCELLANEOUS) ×3 IMPLANT
DRAIN CHANNEL RND F F (WOUND CARE) ×3 IMPLANT
DRAPE C-ARM 42X120 X-RAY (DRAPES) IMPLANT
DRAPE LAPAROSCOPIC ABDOMINAL (DRAPES) ×6 IMPLANT
ELECT PENCIL ROCKER SW 15FT (MISCELLANEOUS) IMPLANT
ELECT REM PT RETURN 9FT ADLT (ELECTROSURGICAL) ×3
ELECTRODE REM PT RTRN 9FT ADLT (ELECTROSURGICAL) ×2 IMPLANT
EVACUATOR SILICONE 100CC (DRAIN) ×3 IMPLANT
GLOVE BIO SURGEON STRL SZ 6.5 (GLOVE) ×3 IMPLANT
GLOVE BIOGEL M 8.0 STRL (GLOVE) ×6 IMPLANT
GLOVE BIOGEL PI IND STRL 7.0 (GLOVE) ×2 IMPLANT
GLOVE BIOGEL PI INDICATOR 7.0 (GLOVE) ×1
GOWN STRL REUS W/TWL 2XL LVL3 (GOWN DISPOSABLE) ×3 IMPLANT
GOWN STRL REUS W/TWL XL LVL3 (GOWN DISPOSABLE) ×15 IMPLANT
HEMOSTAT SURGICEL 4X8 (HEMOSTASIS) IMPLANT
IRRIG SUCT STRYKERFLOW 2 WTIP (MISCELLANEOUS) ×3
IRRIGATION SUCT STRKRFLW 2 WTP (MISCELLANEOUS) ×2 IMPLANT
IV CATH 14GX2 1/4 (CATHETERS) ×3 IMPLANT
KIT BASIN OR (CUSTOM PROCEDURE TRAY) ×3 IMPLANT
L-HOOK LAP DISP 36CM (ELECTROSURGICAL)
LHOOK LAP DISP 36CM (ELECTROSURGICAL) IMPLANT
LIQUID BAND (GAUZE/BANDAGES/DRESSINGS) ×3 IMPLANT
POUCH RETRIEVAL ECOSAC 10 (ENDOMECHANICALS) ×2 IMPLANT
POUCH RETRIEVAL ECOSAC 10MM (ENDOMECHANICALS) ×1
POUCH SPECIMEN RETRIEVAL 10MM (ENDOMECHANICALS) ×3 IMPLANT
SCISSORS LAP 5X35 DISP (ENDOMECHANICALS) ×3 IMPLANT
SCISSORS LAP 5X45 EPIX DISP (ENDOMECHANICALS) ×3 IMPLANT
SCRUB TECHNI CARE 4 OZ NO DYE (MISCELLANEOUS) IMPLANT
SET CHOLANGIOGRAPH MIX (MISCELLANEOUS) IMPLANT
SLEEVE XCEL OPT CAN 5 100 (ENDOMECHANICALS) ×12 IMPLANT
STRIP CLOSURE SKIN 1/2X4 (GAUZE/BANDAGES/DRESSINGS) IMPLANT
SUT ETHILON 3 0 PS 1 (SUTURE) ×3 IMPLANT
SUT VIC AB 2-0 SH 27 (SUTURE) ×1
SUT VIC AB 2-0 SH 27X BRD (SUTURE) ×2 IMPLANT
SUT VIC AB 4-0 PS2 18 (SUTURE) ×3 IMPLANT
SUT VIC AB 4-0 SH 18 (SUTURE) ×3 IMPLANT
SYR 20CC LL (SYRINGE) ×3 IMPLANT
TOWEL OR 17X26 10 PK STRL BLUE (TOWEL DISPOSABLE) ×3 IMPLANT
TOWEL OR NON WOVEN STRL DISP B (DISPOSABLE) ×3 IMPLANT
TRAY LAPAROSCOPIC (CUSTOM PROCEDURE TRAY) ×3 IMPLANT
TROCAR BLADELESS OPT 5 100 (ENDOMECHANICALS) ×6 IMPLANT
TROCAR XCEL BLUNT TIP 100MML (ENDOMECHANICALS) ×3 IMPLANT
TROCAR XCEL NON-BLD 11X100MML (ENDOMECHANICALS) ×3 IMPLANT
TUBING INSUF HEATED (TUBING) ×3 IMPLANT

## 2016-02-04 NOTE — Op Note (Signed)
01/31/2016 - 02/04/2016  9:19 AM  PATIENT:  [name] [name]  65 y.o. male  Patient Care Team: Elby Showers, MD as PCP - General (Internal Medicine)  PRE-OPERATIVE DIAGNOSIS:  cholecystitis  POST-OPERATIVE DIAGNOSIS:  Acute gangrenous cholecystitis  PROCEDURE:  LAPAROSCOPIC CHOLECYSTECTOMY  Surgeon(s): Leighton Ruff, MD Johnathan Hausen, MD  ASSISTANT: Dr Hassell Done   ANESTHESIA:   local and general  EBL:  Total I/O In: 1000 [I.V.:1000] Out: -   DRAINS: (15F) Jackson-Pratt drain(s) with closed bulb suction in the gallbladder fossa   SPECIMEN:  Source of Specimen:  gallbladder  DISPOSITION OF SPECIMEN:  PATHOLOGY  COUNTS:  YES  PLAN OF CARE: patient admitted already  PATIENT DISPOSITION:  PACU - hemodynamically stable.  INDICATION: 65 y.o. M with choledocholithiasis on MRCP.  ERCP and sphincterotomy showed no stones.  A cholecystectomy was recommended.   The anatomy & physiology of hepatobiliary & pancreatic function was discussed.  The pathophysiology of gallbladder dysfunction was discussed.  Natural history risks without surgery was discussed.   I feel the risks of no intervention will lead to serious problems that outweigh the operative risks; therefore, I recommended cholecystectomy to remove the pathology.  I explained laparoscopic techniques with possible need for an open approach.  Probable cholangiogram to evaluate the bilary tract was explained as well.    Risks such as bleeding, infection, abscess, leak, injury to other organs, need for further treatment, heart attack, death, and other risks were discussed.  I noted a good likelihood this will help address the problem.  Possibility that this will not correct all abdominal symptoms was explained.  Goals of post-operative recovery were discussed as well.    OR FINDINGS: Gangrenous cholecystitis   DESCRIPTION:   The patient was identified & brought into the operating room. The patient was positioned supine with arms  tucked. SCDs were active during the entire case. The patient underwent general anesthesia without any difficulty.  The abdomen was prepped and draped in a sterile fashion. A Surgical Timeout was performed and confirmed our plan.  We positioned the patient in reverse [place] & right side up.  I placed a Hassan laparoscopic port through the umbilicus using open entry technique.  There was a small umbilical hernia.  Entry was clean. There were no adhesions to the anterior abdominal wall supraumbilically.  We induced carbon dioxide insufflation. Camera inspection revealed no injury.    I proceeded to continue with laparoscopic technique. I placed a 5 mm port in mid subcostal region, another 36mm port in the right flank near the anterior axillary line, and a 29mm port in the left subxiphoid region obliquely within the falciform ligament.  I turned attention to the right upper quadrant.  There were dense omental adhesions to the dome of the gallbladder.  This was taken down bluntly.  I encountered a small amount of bile tinged purulence.  I aspirated the tense gallbladder fundus using a suction device.  The gallbladder fundus was then elevated cephalad. I used cautery and blunt dissection to free the peritoneal coverings between the gallbladder and the liver on the posteriolateral and anteriomedial walls.   I used careful blunt and cautery dissection with a blunt dissector to help get a good critical view of the cystic artery and cystic duct. I did further dissection to free a few centimeters of the  gallbladder off the liver bed to get a good critical view of the infundibulum and cystic duct. I mobilized the cystic artery.  I skeletonized the cystic  duct.  After getting a good 360 view, I decided not to perform a cholangiogram.  I placed a clip on the infundibulum.  I placed clips on the cystic duct x3.  I used scissors to complete the cystic duct transection.   I placed clips on the cystic artery x3 with 2  proximally.  I ligated the cystic artery using scissors. I freed the gallbladder from its remaining attachments to the liver. I ensured hemostasis on the gallbladder fossa of the liver and elsewhere. I inspected the rest of the abdomen & detected no injury nor bleeding elsewhere.  I irrigated the RUQ with normal saline.  Hemostasis was good.  I decided to leave a a drain in the gallbladder fossa due to infectious concerns post surgery.  This was brought out through the RUQ port site and secured using a 2-0 Nylon suture.  I removed the gallbladder through the umbilical port site.  I closed the umbilical fascia using 0 Vicryl stitches x2. I closed the subcutaneous tissue with 0 Vicryl sutures.  I closed the skin using 4-0 vicryl stitch.  Sterile dressings were applied. The patient was extubated & arrived in the PACU in stable condition.  I had discussed postoperative care with the patient in the holding area.   I will discuss  operative findings and postoperative goals / instructions with the patient's family.  Instructions are written in the chart as well.

## 2016-02-04 NOTE — Anesthesia Postprocedure Evaluation (Signed)
Anesthesia Post Note  Patient: [name] [name]  Procedure(s) Performed: Procedure(s) (LRB): LAPAROSCOPIC CHOLECYSTECTOMY (N/A)  Patient location during evaluation: PACU Anesthesia Type: General Level of consciousness: awake and alert, oriented and patient cooperative Pain management: pain level controlled Vital Signs Assessment: post-procedure vital signs reviewed and stable Respiratory status: spontaneous breathing, nonlabored ventilation, respiratory function stable and patient connected to nasal cannula oxygen Cardiovascular status: blood pressure returned to baseline and stable Postop Assessment: no signs of nausea or vomiting Anesthetic complications: no    Last Vitals:  Vitals:   02/04/16 1004 02/04/16 1012  BP: (!) 182/93 (!) 188/97  Pulse: (!) 101 (!) 101  Resp: (!) 22 (!) 29  Temp:      Last Pain:  Vitals:   02/04/16 0553  TempSrc:   PainSc: 1                  Nevaen Tredway,E. Mystie Ormand

## 2016-02-04 NOTE — Transfer of Care (Signed)
Immediate Anesthesia Transfer of Care Note  Patient: [name] [name]  Procedure(s) Performed: Procedure(s): LAPAROSCOPIC CHOLECYSTECTOMY (N/A)  Patient Location: PACU  Anesthesia Type:General  Level of Consciousness:  sedated, patient cooperative and responds to stimulation  Airway & Oxygen Therapy:Patient Spontanous Breathing and Patient connected to face mask oxgen  Post-op Assessment:  Report given to PACU RN and Post -op Vital signs reviewed and stable  Post vital signs:  Reviewed and stable  Last Vitals:  Vitals:   02/04/16 0945 02/04/16 0946  BP: (!) 203/97   Pulse: (!) 106 (!) 109  Resp: (!) 34 (!) 27  Temp: [id number] C     Complications: No apparent anesthesia complications

## 2016-02-04 NOTE — Progress Notes (Signed)
PROGRESS NOTE    [name] [name]  [id number] DOB: 09/08/1950 DOA: 01/31/2016 PCP: Elby Showers, MD     Brief Narrative: HPI as per Dr. Hal Hope: HPI: [name] [name] is a 65 y.o. male with history of diabetes mellitus type 2, hypertension, hyperlipidemia presents to the ER because of persistent abdominal pain since yesterday morning. Patient's pain is mostly in the epigastric area. Denies any radiation. Denies any associated shortness of breath or chest pain. Denies any nausea vomiting but has been having at least 5 episodes of loose stools. In the ER patient's labs show elevated LFTs with sonogram showing gallstones and dilated pancreatic duct. Patient denies any fever or chills. Patient is being admitted for further management of abdominal pain with gallstones and possible obstruction in the pancreatic duct.   ED Course: Was given pain relief medications. Has sonogram as discussed above. With labs showing elevated LFTs.  Since admission, patient's labs have trended down.  He remains febrile to 100.7 but generally has adequate pain control.  MRCP appears to show obstructing stone however ERCP not significant for stones. General surgery took him to the OR for laparoscopic cholecystectomy on 7/30.  Assessment & Plan:   Principal Problem:   Cholecystitis, acute Active Problems:   Hypertension   Type 2 diabetes mellitus (HCC)   Cholelithiasis   Elevated liver enzymes   Abnormal computed tomography of pancreas or bile duct   Dilated bile duct   Elevated bilirubin  Acute cholecystitis with cholelithiasis S/p cholecystectomy on 7/30 -Persistently elevated WBC count with fever -On Zosyn (7/27 >> -Currently adequate pain control -Improving LFTs and bilirubin is stable  HTN -Suboptimal control since stopping home regimen -Restart home Lisinopril 10mg  and amlodipine 10mg   DM -No hypoglycemia -Maximum blood glucose of 126 last 24 hours, not given SSI -Continue to follow  with SSI  Hyperlipidemia -Restart home atorvastatin 10mg  daily   DVT prophylaxis: SCDs. Code Status: Full code.  Family Communication: Discussed with patient.  Disposition Plan: Home.  Admission status: Inpatient. MedSurg.   Consultants:   General Surgery  GI  Procedures:  MRCP  ERCP (7/28)  Cholecystectomy (7/30)  Antimicrobials:   Zosyn, started 7/27   Subjective: Patient is feeling fatigued since returning from his surgery. Abdominal pain is present but manageable. No chest pain or dyspnea.   Objective: Vitals:   02/04/16 1012 02/04/16 1021 02/04/16 1030 02/04/16 1040  BP: (!) 188/97 (!) 175/86 (!) 174/83 (!) 187/96  Pulse: (!) 101 87 90 91  Resp: (!) 29 (!) 23  18  Temp:   98.8 F (37.1 C) 98.9 F (37.2 C)  TempSrc:      SpO2: 92% 94% 95% 94%  Weight:      Height:        Intake/Output Summary (Last 24 hours) at 02/04/16 1122 Last data filed at 02/04/16 1000  Gross per 24 hour  Intake          2814.33 ml  Output              770 ml  Net          2044.33 ml   Filed Weights   02/02/16 1010 02/03/16 0402 02/04/16 0500  Weight: 122.5 kg (270 lb) 132.3 kg (291 lb 9.6 oz) 132.3 kg (291 lb 9 oz)    Examination:  General exam: Appears calm and comfortable  Respiratory system: Clear to auscultation. Respiratory effort normal. Cardiovascular system: S1 & S2 heard, RRR. No JVD, murmurs, rubs, gallops or clicks. No  pedal edema. Gastrointestinal system: Abdomen is nondistended, soft and tender generally. No organomegaly or masses felt. Normal bowel sounds heard. Two dressings applied, at periumbilical and RUQ areas. Drain in place with serosanguinous fluid in bulb Central nervous system: Alert and oriented. No focal neurological deficits. Extremities: Symmetric 5 x 5 power. Skin: No rashes, lesions or ulcers Psychiatry: Judgement and insight appear normal. Mood & affect appropriate.     Data Reviewed: I have personally reviewed following labs and  imaging studies  CBC:  Recent Labs Lab 01/31/16 2346 02/01/16 0550 02/02/16 0419 02/03/16 0846  WBC 5.5 6.5 11.7* 12.5*  NEUTROABS 3.1 5.2 9.2* 10.1*  HGB 13.9 13.3 13.1 12.8*  HCT 41.7 39.7 39.2 37.7*  MCV 92.5 92.1 92.5 92.6  PLT 260 251 232 [id number]   Basic Metabolic Panel:  Recent Labs Lab 01/31/16 2346 02/01/16 0550 02/02/16 0419 02/03/16 0846  NA 138 136 136 136  K 3.6 4.0 3.6 4.0  CL 105 106 105 101  CO2 27 24 24 26   GLUCOSE 143* 126* 102* 110*  BUN 11 10 10 12   CREATININE 0.86 0.84 0.96 0.91  CALCIUM 9.0 8.5* 8.4* 8.4*   GFR: Estimated Creatinine Clearance: 113.6 mL/min (by C-G formula based on SCr of 0.91 mg/dL). Liver Function Tests:  Recent Labs Lab 01/31/16 2346 02/01/16 0550 02/02/16 0419 02/03/16 0846  AST 278* 177* 58* 29  ALT 295* 260* 155* 97*  ALKPHOS 165* 163* 135* 112  BILITOT 3.3* 2.2* 2.6* 2.1*  PROT 7.8 7.2 7.3 7.5  ALBUMIN 4.1 3.9 3.5 3.4*    Recent Labs Lab 01/31/16 2346 02/01/16 0550 02/03/16 0846  LIPASE 18 21 16    No results for input(s): AMMONIA in the last 168 hours. Coagulation Profile: No results for input(s): INR, PROTIME in the last 168 hours. Cardiac Enzymes: No results for input(s): CKTOTAL, CKMB, CKMBINDEX, TROPONINI in the last 168 hours. BNP (last 3 results) No results for input(s): PROBNP in the last 8760 hours. HbA1C: No results for input(s): HGBA1C in the last 72 hours. CBG:  Recent Labs Lab 02/03/16 1651 02/03/16 2016 02/03/16 2350 02/04/16 0354 02/04/16 1000  GLUCAP 129* 113* 125* 108* 126*   Lipid Profile: No results for input(s): CHOL, HDL, LDLCALC, TRIG, CHOLHDL, LDLDIRECT in the last 72 hours. Thyroid Function Tests: No results for input(s): TSH, T4TOTAL, FREET4, T3FREE, THYROIDAB in the last 72 hours. Anemia Panel: No results for input(s): VITAMINB12, FOLATE, FERRITIN, TIBC, IRON, RETICCTPCT in the last 72 hours. Urine analysis:    Component Value Date/Time   COLORURINE AMBER (A)  02/01/2016 1700   APPEARANCEUR CLOUDY (A) 02/01/2016 1700   LABSPEC 1.039 (H) 02/01/2016 1700   PHURINE 7.0 02/01/2016 1700   GLUCOSEU NEGATIVE 02/01/2016 1700   HGBUR NEGATIVE 02/01/2016 1700   BILIRUBINUR NEGATIVE 02/01/2016 1700   BILIRUBINUR neg 02/27/2015 1021   KETONESUR NEGATIVE 02/01/2016 1700   PROTEINUR NEGATIVE 02/01/2016 1700   UROBILINOGEN negative 02/27/2015 1021   NITRITE NEGATIVE 02/01/2016 1700   LEUKOCYTESUR NEGATIVE 02/01/2016 1700   Sepsis Labs: @LABRCNTIP (procalcitonin:4,lacticidven:4)  ) Recent Results (from the past 240 hour(s))  Surgical pcr screen     Status: None   Collection Time: 02/03/16  5:22 PM  Result Value Ref Range Status   MRSA, PCR NEGATIVE NEGATIVE Final   Staphylococcus aureus NEGATIVE NEGATIVE Final    Comment:        The Xpert SA Assay (FDA approved for NASAL specimens in patients over 37 years of age), is one component of a comprehensive surveillance  program.  Test performance has been validated by [facility] for patients greater than or equal to 42 year old. It is not intended to diagnose infection nor to guide or monitor treatment.          Radiology Studies: Dg Ercp Biliary & Pancreatic Ducts  Result Date: 02/02/2016 CLINICAL DATA:  65 year old male with choledocholithiasis EXAM: ERCP TECHNIQUE: Multiple spot images obtained with the fluoroscopic device and submitted for interpretation post-procedure. FLUOROSCOPY TIME:  Please see GI operative note for detail COMPARISON:  MRCP 02/01/2016 FINDINGS: Fluoroscopic images demonstrate a flexible endoscope in the descending duodenum and cannulation of the common bile duct. Subsequent images demonstrate mild biliary ductal dilatation. Question of filling defects in the distal common bile duct consistent with choledocholithiasis. Subsequent images document sphincterotomy and balloon sweep of the common duct. IMPRESSION: 1. Mild biliary ductal dilatation with probable  choledocholithiasis. 2. The images demonstrate sphincterotomy and balloon sweep of the common duct. These images were submitted for radiologic interpretation only. Please see the procedural report for the amount of contrast and the fluoroscopy time utilized. Electronically Signed   By: Jacqulynn Cadet M.D.   On: 02/02/2016 13:15       Scheduled Meds: . insulin aspart  0-9 Units Subcutaneous Q4H  . labetalol      . piperacillin-tazobactam (ZOSYN)  IV  3.375 g Intravenous Q8H   Continuous Infusions: . lactated ringers 75 mL/hr at 02/04/16 1051     LOS: 3 days    Time spent: 25 minutes    Cordelia Poche, MD [facility]   If 7PM-7AM, please contact night-coverage [email or web address] Password [email or web address] 02/04/2016, 11:22 AM

## 2016-02-04 NOTE — Progress Notes (Signed)
Patient ID: [name] [name], male   DOB: 11/18/1950, 65 y.o.   MRN: [id number] [facility] Surgery Progress Note:   1 Day Post-Op  Subjective: Mental status is alert.  Pain is better this am.    Objective: Vital signs in last 24 hours: Temp:  [99.1 F (37.3 C)-99.3 F (37.4 C)] 99.1 F (37.3 C) (07/30 0406) Pulse Rate:  [86-98] 98 (07/30 0406) Resp:  [16-20] 20 (07/30 0406) BP: (151-153)/(70-78) 153/70 (07/30 0406) SpO2:  [94 %-97 %] 97 % (07/30 0406) Weight:  [132.3 kg (291 lb 9 oz)] 132.3 kg (291 lb 9 oz) (07/30 0500)  Intake/Output from previous day: 07/29 0701 - 07/30 0700 In: 914.3 [P.O.:240; I.V.:254.3; IV Piggyback:420] Out: 750 [Urine:750] Intake/Output this shift: No intake/output data recorded.  Physical Exam:  CV: RRR Lungs: CTA Abd: soft   Lab Results:  Results for orders placed or performed during the hospital encounter of 01/31/16 (from the past 48 hour(s))  Glucose, capillary     Status: Abnormal   Collection Time: 02/02/16  8:21 AM  Result Value Ref Range   Glucose-Capillary 132 (H) 65 - 99 mg/dL   Comment 1 Notify RN    Comment 2 Document in Chart   Glucose, capillary     Status: Abnormal   Collection Time: 02/02/16  1:06 PM  Result Value Ref Range   Glucose-Capillary 122 (H) 65 - 99 mg/dL  Glucose, capillary     Status: Abnormal   Collection Time: 02/02/16  4:36 PM  Result Value Ref Range   Glucose-Capillary 158 (H) 65 - 99 mg/dL   Comment 1 Notify RN    Comment 2 Document in Chart   Glucose, capillary     Status: Abnormal   Collection Time: 02/02/16  8:14 PM  Result Value Ref Range   Glucose-Capillary 107 (H) 65 - 99 mg/dL   Comment 1 Notify RN   Glucose, capillary     Status: Abnormal   Collection Time: 02/02/16 11:58 PM  Result Value Ref Range   Glucose-Capillary 110 (H) 65 - 99 mg/dL   Comment 1 Notify RN   Glucose, capillary     Status: None   Collection Time: 02/03/16  4:01 AM  Result Value Ref Range   Glucose-Capillary 97 65 -  99 mg/dL   Comment 1 Notify RN   Glucose, capillary     Status: Abnormal   Collection Time: 02/03/16  7:37 AM  Result Value Ref Range   Glucose-Capillary 102 (H) 65 - 99 mg/dL  CBC with Differential/Platelet     Status: Abnormal   Collection Time: 02/03/16  8:46 AM  Result Value Ref Range   WBC 12.5 (H) 4.0 - 10.5 K/uL   RBC 4.07 (L) 4.22 - 5.81 MIL/uL   Hemoglobin 12.8 (L) 13.0 - 17.0 g/dL   HCT 37.7 (L) 39.0 - 52.0 %   MCV 92.6 78.0 - 100.0 fL   MCH 31.4 26.0 - 34.0 pg   MCHC 34.0 30.0 - 36.0 g/dL   RDW 13.2 11.5 - 15.5 %   Platelets 235 150 - 400 K/uL   Neutrophils Relative % 81 %   Neutro Abs 10.1 (H) 1.7 - 7.7 K/uL   Lymphocytes Relative 11 %   Lymphs Abs 1.4 0.7 - 4.0 K/uL   Monocytes Relative 8 %   Monocytes Absolute 1.0 0.1 - 1.0 K/uL   Eosinophils Relative 0 %   Eosinophils Absolute 0.0 0.0 - 0.7 K/uL   Basophils Relative 0 %  Basophils Absolute 0.0 0.0 - 0.1 K/uL  Comprehensive metabolic panel     Status: Abnormal   Collection Time: 02/03/16  8:46 AM  Result Value Ref Range   Sodium 136 135 - 145 mmol/L   Potassium 4.0 3.5 - 5.1 mmol/L   Chloride 101 101 - 111 mmol/L   CO2 26 22 - 32 mmol/L   Glucose, Bld 110 (H) 65 - 99 mg/dL   BUN 12 6 - 20 mg/dL   Creatinine, Ser 0.91 0.61 - 1.24 mg/dL   Calcium 8.4 (L) 8.9 - 10.3 mg/dL   Total Protein 7.5 6.5 - 8.1 g/dL   Albumin 3.4 (L) 3.5 - 5.0 g/dL   AST 29 15 - 41 U/L   ALT 97 (H) 17 - 63 U/L   Alkaline Phosphatase 112 38 - 126 U/L   Total Bilirubin 2.1 (H) 0.3 - 1.2 mg/dL   GFR calc non Af Amer >60 >60 mL/min   GFR calc Af Amer >60 >60 mL/min    Comment: (NOTE) The eGFR has been calculated using the CKD EPI equation. This calculation has not been validated in all clinical situations. eGFR's persistently <60 mL/min signify possible Chronic Kidney Disease.    Anion gap 9 5 - 15  Lipase, blood     Status: None   Collection Time: 02/03/16  8:46 AM  Result Value Ref Range   Lipase 16 11 - 51 U/L  Glucose,  capillary     Status: Abnormal   Collection Time: 02/03/16 11:52 AM  Result Value Ref Range   Glucose-Capillary 129 (H) 65 - 99 mg/dL  Glucose, capillary     Status: Abnormal   Collection Time: 02/03/16  4:51 PM  Result Value Ref Range   Glucose-Capillary 129 (H) 65 - 99 mg/dL  Surgical pcr screen     Status: None   Collection Time: 02/03/16  5:22 PM  Result Value Ref Range   MRSA, PCR NEGATIVE NEGATIVE   Staphylococcus aureus NEGATIVE NEGATIVE    Comment:        The Xpert SA Assay (FDA approved for NASAL specimens in patients over 67 years of age), is one component of a comprehensive surveillance program.  Test performance has been validated by [facility] for patients greater than or equal to 80 year old. It is not intended to diagnose infection nor to guide or monitor treatment.   Glucose, capillary     Status: Abnormal   Collection Time: 02/03/16  8:16 PM  Result Value Ref Range   Glucose-Capillary 113 (H) 65 - 99 mg/dL  Glucose, capillary     Status: Abnormal   Collection Time: 02/03/16 11:50 PM  Result Value Ref Range   Glucose-Capillary 125 (H) 65 - 99 mg/dL   Comment 1 Notify RN   Glucose, capillary     Status: Abnormal   Collection Time: 02/04/16  3:54 AM  Result Value Ref Range   Glucose-Capillary 108 (H) 65 - 99 mg/dL   Comment 1 Notify RN     Radiology/Results: Dg Ercp Biliary & Pancreatic Ducts  Result Date: 02/02/2016 CLINICAL DATA:  65 year old male with choledocholithiasis EXAM: ERCP TECHNIQUE: Multiple spot images obtained with the fluoroscopic device and submitted for interpretation post-procedure. FLUOROSCOPY TIME:  Please see GI operative note for detail COMPARISON:  MRCP 02/01/2016 FINDINGS: Fluoroscopic images demonstrate a flexible endoscope in the descending duodenum and cannulation of the common bile duct. Subsequent images demonstrate mild biliary ductal dilatation. Question of filling defects in the distal common bile  duct consistent with  choledocholithiasis. Subsequent images document sphincterotomy and balloon sweep of the common duct. IMPRESSION: 1. Mild biliary ductal dilatation with probable choledocholithiasis. 2. The images demonstrate sphincterotomy and balloon sweep of the common duct. These images were submitted for radiologic interpretation only. Please see the procedural report for the amount of contrast and the fluoroscopy time utilized. Electronically Signed   By: Jacqulynn Cadet M.D.   On: 02/02/2016 13:15   Anti-infectives: Anti-infectives    Start     Dose/Rate Route Frequency Ordered Stop   02/01/16 1400  piperacillin-tazobactam (ZOSYN) IVPB 3.375 g     3.375 g 12.5 mL/hr over 240 Minutes Intravenous Every 8 hours 02/01/16 0516     02/01/16 0530  piperacillin-tazobactam (ZOSYN) IVPB 3.375 g     3.375 g 100 mL/hr over 30 Minutes Intravenous  Once 02/01/16 0516 02/01/16 6160      Assessment/Plan: Problem List: Patient Active Problem List   Diagnosis Date Noted  . Elevated bilirubin   . Abnormal computed tomography of pancreas or bile duct   . Dilated bile duct   . Cholelithiasis 02/01/2016  . Elevated liver enzymes   . Cholecystitis, acute   . Metabolic syndrome 73/71/0626  . Hyperlipidemia 09/24/2014  . History of gout 07/04/2013  . Low serum testosterone level 07/04/2013  . GE reflux 07/06/2012  . Type 2 diabetes mellitus ([place]) 03/09/2012  . History of esophageal stricture 03/09/2012  . Osteoarthritis of both knees 03/09/2012  . Obesity 03/09/2012  . Erectile dysfunction 03/02/2012  . Hypertension 03/02/2012    Cholecystitis post ERCP, sphincterotomy yesterday;  Bilirubin coming down.   1 Day Post-Op   OR today for lap chole.  The anatomy & physiology of hepatobiliary & pancreatic function was discussed.  The pathophysiology of gallbladder dysfunction was discussed.  Natural history risks without surgery was discussed.   I feel the risks of no intervention will lead to serious problems  that outweigh the operative risks; therefore, I recommended cholecystectomy to remove the pathology.  I explained laparoscopic techniques with possible need for an open approach.  Probable cholangiogram to evaluate the bilary tract was explained as well.    Risks such as bleeding, infection, abscess, leak, injury to other organs, need for further treatment, heart attack, death, and other risks were discussed.  I noted a good likelihood this will help address the problem.  Possibility that this will not correct all abdominal symptoms was explained.  Goals of post-operative recovery were discussed as well.  We will work to minimize complications.  An educational handout further explaining the pathology and treatment options was given as well.  Questions were answered.  The patient expresses understanding & wishes to proceed with surgery.   LOS: 3 days   Rosario Adie, MD  [facility] [place] [facility]  02/04/2016 7:33 AM

## 2016-02-04 NOTE — Anesthesia Procedure Notes (Signed)
Procedure Name: Intubation Date/Time: 02/04/2016 7:49 AM Performed by: West Pugh Pre-anesthesia Checklist: Patient identified, Emergency Drugs available, Suction available, Patient being monitored and Timeout performed Patient Re-evaluated:Patient Re-evaluated prior to inductionOxygen Delivery Method: Circle system utilized Preoxygenation: Pre-oxygenation with 100% oxygen Intubation Type: IV induction, Cricoid Pressure applied and Rapid sequence Laryngoscope Size: Mac and 4 Grade View: Grade I Tube type: Oral Tube size: 7.5 mm Number of attempts: 1 Airway Equipment and Method: Stylet Placement Confirmation: ETT inserted through vocal cords under direct vision,  positive ETCO2,  CO2 detector and breath sounds checked- equal and bilateral Secured at: 23 cm Tube secured with: Tape Dental Injury: Teeth and Oropharynx as per pre-operative assessment

## 2016-02-04 NOTE — Anesthesia Preprocedure Evaluation (Signed)
Anesthesia Evaluation  Patient identified by MRN, date of birth, ID band Patient awake    Reviewed: Allergy & Precautions, NPO status , Patient's Chart, lab work & pertinent test results  History of Anesthesia Complications Negative for: history of anesthetic complications  Airway Mallampati: III  TM Distance: >3 FB Neck ROM: Full    Dental  (+) Dental Advisory Given   Pulmonary neg pulmonary ROS,    breath sounds clear to auscultation       Cardiovascular hypertension, Pt. on medications (-) angina Rhythm:Regular Rate:Normal     Neuro/Psych negative neurological ROS     GI/Hepatic GERD  Medicated,Elevated LFTs with acute chole   Endo/Other  diabetes (glu 108), Oral Hypoglycemic AgentsMorbid obesity  Renal/GU      Musculoskeletal  (+) Arthritis , Osteoarthritis,    Abdominal (+) + obese,   Peds  Hematology   Anesthesia Other Findings   Reproductive/Obstetrics                             Anesthesia Physical Anesthesia Plan  ASA: III  Anesthesia Plan: General   Post-op Pain Management:    Induction: Intravenous  Airway Management Planned: Oral ETT  Additional Equipment:   Intra-op Plan:   Post-operative Plan: Extubation in OR  Informed Consent: I have reviewed the patients History and Physical, chart, labs and discussed the procedure including the risks, benefits and alternatives for the proposed anesthesia with the patient or authorized representative who has indicated his/her understanding and acceptance.   Dental advisory given  Plan Discussed with: CRNA and Surgeon  Anesthesia Plan Comments: (Plan routine monitors, GETA)        Anesthesia Quick Evaluation

## 2016-02-05 ENCOUNTER — Encounter (HOSPITAL_COMMUNITY): Payer: Self-pay | Admitting: General Surgery

## 2016-02-05 LAB — BASIC METABOLIC PANEL
ANION GAP: 10 (ref 5–15)
BUN: 10 mg/dL (ref 6–20)
CO2: 22 mmol/L (ref 22–32)
Calcium: 8.2 mg/dL — ABNORMAL LOW (ref 8.9–10.3)
Chloride: 102 mmol/L (ref 101–111)
Creatinine, Ser: 0.83 mg/dL (ref 0.61–1.24)
GFR calc Af Amer: >60 mL/min (ref >60)
Glucose, Bld: 111 mg/dL — ABNORMAL HIGH (ref 65–99)
POTASSIUM: 3.7 mmol/L (ref 3.5–5.1)
SODIUM: 134 mmol/L — AB (ref 135–145)

## 2016-02-05 LAB — GLUCOSE, CAPILLARY
GLUCOSE-CAPILLARY: 113 mg/dL — AB (ref 65–99)
GLUCOSE-CAPILLARY: 163 mg/dL — AB (ref 65–99)
Glucose-Capillary: 110 mg/dL — ABNORMAL HIGH (ref 65–99)
Glucose-Capillary: 107 mg/dL — ABNORMAL HIGH (ref 65–99)
Glucose-Capillary: 128 mg/dL — ABNORMAL HIGH (ref 65–99)

## 2016-02-05 LAB — HEPATIC FUNCTION PANEL
ALK PHOS: 86 U/L (ref 38–126)
ALT: 83 U/L — ABNORMAL HIGH (ref 17–63)
AST: 60 U/L — ABNORMAL HIGH (ref 15–41)
Albumin: 3.1 g/dL — ABNORMAL LOW (ref 3.5–5.0)
BILIRUBIN INDIRECT: 1 mg/dL — AB (ref 0.3–0.9)
Bilirubin, Direct: 0.6 mg/dL — ABNORMAL HIGH (ref 0.1–0.5)
TOTAL PROTEIN: 7.1 g/dL (ref 6.5–8.1)
Total Bilirubin: 1.6 mg/dL — ABNORMAL HIGH (ref 0.3–1.2)

## 2016-02-05 LAB — CBC
HEMATOCRIT: 36.6 % — AB (ref 39.0–52.0)
Hemoglobin: 12.1 g/dL — ABNORMAL LOW (ref 13.0–17.0)
MCH: 30.5 pg (ref 26.0–34.0)
MCHC: 33.1 g/dL (ref 30.0–36.0)
MCV: 92.2 fL (ref 78.0–100.0)
Platelets: 270 10*3/uL (ref 150–400)
RBC: 3.97 MIL/uL — ABNORMAL LOW (ref 4.22–5.81)
RDW: 13.3 % (ref 11.5–15.5)
WBC: 9.5 10*3/uL (ref 4.0–10.5)

## 2016-02-05 MED ORDER — DOCUSATE SODIUM 100 MG PO CAPS
100.0000 mg | ORAL_CAPSULE | Freq: Two times a day (BID) | ORAL | Status: DC
  Administered 2016-02-05 – 2016-02-07 (×5): 100 mg via ORAL
  Filled 2016-02-05 (×5): qty 1

## 2016-02-05 NOTE — Progress Notes (Signed)
PROGRESS NOTE    [name] [name]  [id number] DOB: 05-Jan-1951 DOA: 01/31/2016 PCP: Elby Showers, MD     Brief Narrative: HPI as per Dr. Hal Hope: HPI: [name] [name] is a 65 y.o. male with history of diabetes mellitus type 2, hypertension, hyperlipidemia presents to the ER because of persistent abdominal pain since yesterday morning. Patient's pain is mostly in the epigastric area. Denies any radiation. Denies any associated shortness of breath or chest pain. Denies any nausea vomiting but has been having at least 5 episodes of loose stools. In the ER patient's labs show elevated LFTs with sonogram showing gallstones and dilated pancreatic duct. Patient denies any fever or chills. Patient is being admitted for further management of abdominal pain with gallstones and possible obstruction in the pancreatic duct.   ED Course: Was given pain relief medications. Has sonogram as discussed above. With labs showing elevated LFTs.  Since admission, patient's labs have trended down.  MRCP appears to show obstructing stone however ERCP not significant for stones. General surgery took him to the OR for laparoscopic cholecystectomy on 7/30. He has remained afebrile for >48 and has had adequate pain control. Zosyn continued for at least 2-3 days post op per general surgery recommendations.  Assessment & Plan:   Principal Problem:   Cholecystitis, acute Active Problems:   Hypertension   Type 2 diabetes mellitus (HCC)   Cholelithiasis   Elevated liver enzymes   Abnormal computed tomography of pancreas or bile duct   Dilated bile duct   Elevated bilirubin  Acute cholecystitis with cholelithiasis S/p cholecystectomy on 7/30. AST with slight bump, but overall trending down. Bilirubin trending down -Persistently elevated WBC count without fever -On Zosyn (7/27 >> -Currently adequate pain control -CMP in AM  HTN -Suboptimal control since stopping home regimen -continue home Lisinopril  10mg  and amlodipine 10mg   DM No hypoglycemia. Maximum blood glucose of 145 last 24 hours, 1 unit SSI -Continue to follow with SSI  Hyperlipidemia -hold home atorvastatin 10mg  daily   DVT prophylaxis: SCDs. Code Status: Full code.  Family Communication: Discussed with patient.  Disposition Plan: Home.  Admission status: Inpatient. MedSurg.   Consultants:   General Surgery  GI  Procedures:  MRCP  ERCP (7/28)  Cholecystectomy (7/30)  Antimicrobials:   Zosyn, started 7/27>> expected end date of 8/2 with transition to PO   Subjective: Patient states he is feeling better. Pain is controlled. He was having a little trouble sleeping yesterday but was given benadryl, which helped.  Objective: Vitals:   02/04/16 1359 02/04/16 1838 02/04/16 2006 02/05/16 0428  BP: (!) 157/88 (!) 177/88 (!) 172/94 (!) 173/94  Pulse: 94 94 92 86  Resp: 18 18 18 20   Temp: 98.4 F (36.9 C) 99.1 F (37.3 C) 98.8 F (37.1 C) 98.9 F (37.2 C)  TempSrc: Oral Oral Oral Oral  SpO2: 96% 96% 96% 91%  Weight:      Height:        Intake/Output Summary (Last 24 hours) at 02/05/16 0830 Last data filed at 02/05/16 0540  Gross per 24 hour  Intake           3405.2 ml  Output              855 ml  Net           2550.2 ml   Filed Weights   02/02/16 1010 02/03/16 0402 02/04/16 0500  Weight: 122.5 kg (270 lb) 132.3 kg (291 lb 9.6 oz) 132.3 kg (291 lb  9 oz)    Examination:  General exam: Appears calm and comfortable, sitting in chair. I witnessed him ambulate with a walker from the restroom Respiratory system: Clear to auscultation. Respiratory effort normal. Cardiovascular system: S1 & S2 heard, RRR. No JVD, murmurs, rubs, gallops or clicks. No pedal edema. Gastrointestinal system: Abdomen is nondistended, soft and tender generally but especially periumbilical. No organomegaly or masses felt. Normal bowel sounds heard. Two dressings applied, at periumbilical and RUQ areas. Drain in place with  serosanguinous fluid in bulb Central nervous system: Alert and oriented. No focal neurological deficits. Extremities: Symmetric 5 x 5 power. Skin: No rashes, lesions or ulcers Psychiatry: Judgement and insight appear normal. Mood & affect appropriate.     Data Reviewed: I have personally reviewed following labs and imaging studies  CBC:  Recent Labs Lab 01/31/16 2346 02/01/16 0550 02/02/16 0419 02/03/16 0846 02/05/16 0411  WBC 5.5 6.5 11.7* 12.5* 9.5  NEUTROABS 3.1 5.2 9.2* 10.1*  --   HGB 13.9 13.3 13.1 12.8* 12.1*  HCT 41.7 39.7 39.2 37.7* 36.6*  MCV 92.5 92.1 92.5 92.6 92.2  PLT 260 251 232 235 [id number]   Basic Metabolic Panel:  Recent Labs Lab 01/31/16 2346 02/01/16 0550 02/02/16 0419 02/03/16 0846 02/05/16 0411  NA 138 136 136 136 134*  K 3.6 4.0 3.6 4.0 3.7  CL 105 106 105 101 102  CO2 27 24 24 26 22   GLUCOSE 143* 126* 102* 110* 111*  BUN 11 10 10 12 10   CREATININE 0.86 0.84 0.96 0.91 0.83  CALCIUM 9.0 8.5* 8.4* 8.4* 8.2*   GFR: Estimated Creatinine Clearance: 124.6 mL/min (by C-G formula based on SCr of 0.83 mg/dL). Liver Function Tests:  Recent Labs Lab 01/31/16 2346 02/01/16 0550 02/02/16 0419 02/03/16 0846 02/05/16 0411  AST 278* 177* 58* 29 60*  ALT 295* 260* 155* 97* 83*  ALKPHOS 165* 163* 135* 112 86  BILITOT 3.3* 2.2* 2.6* 2.1* 1.6*  PROT 7.8 7.2 7.3 7.5 7.1  ALBUMIN 4.1 3.9 3.5 3.4* 3.1*    Recent Labs Lab 01/31/16 2346 02/01/16 0550 02/03/16 0846  LIPASE 18 21 16    No results for input(s): AMMONIA in the last 168 hours. Coagulation Profile: No results for input(s): INR, PROTIME in the last 168 hours. Cardiac Enzymes: No results for input(s): CKTOTAL, CKMB, CKMBINDEX, TROPONINI in the last 168 hours. BNP (last 3 results) No results for input(s): PROBNP in the last 8760 hours. HbA1C: No results for input(s): HGBA1C in the last 72 hours. CBG:  Recent Labs Lab 02/04/16 1703 02/04/16 2001 02/04/16 2343 02/05/16 0416  02/05/16 0744  GLUCAP 141* 145* 135* 110* 107*   Lipid Profile: No results for input(s): CHOL, HDL, LDLCALC, TRIG, CHOLHDL, LDLDIRECT in the last 72 hours. Thyroid Function Tests: No results for input(s): TSH, T4TOTAL, FREET4, T3FREE, THYROIDAB in the last 72 hours. Anemia Panel: No results for input(s): VITAMINB12, FOLATE, FERRITIN, TIBC, IRON, RETICCTPCT in the last 72 hours. Urine analysis:    Component Value Date/Time   COLORURINE AMBER (A) 02/01/2016 1700   APPEARANCEUR CLOUDY (A) 02/01/2016 1700   LABSPEC 1.039 (H) 02/01/2016 1700   PHURINE 7.0 02/01/2016 1700   GLUCOSEU NEGATIVE 02/01/2016 1700   HGBUR NEGATIVE 02/01/2016 1700   BILIRUBINUR NEGATIVE 02/01/2016 1700   BILIRUBINUR neg 02/27/2015 1021   KETONESUR NEGATIVE 02/01/2016 1700   PROTEINUR NEGATIVE 02/01/2016 1700   UROBILINOGEN negative 02/27/2015 1021   NITRITE NEGATIVE 02/01/2016 1700   LEUKOCYTESUR NEGATIVE 02/01/2016 1700   Sepsis Labs: @LABRCNTIP (procalcitonin:4,lacticidven:4)  )  Recent Results (from the past 240 hour(s))  Surgical pcr screen     Status: None   Collection Time: 02/03/16  5:22 PM  Result Value Ref Range Status   MRSA, PCR NEGATIVE NEGATIVE Final   Staphylococcus aureus NEGATIVE NEGATIVE Final    Comment:        The Xpert SA Assay (FDA approved for NASAL specimens in patients over 20 years of age), is one component of a comprehensive surveillance program.  Test performance has been validated by [facility] for patients greater than or equal to 41 year old. It is not intended to diagnose infection nor to guide or monitor treatment.          Radiology Studies: No results found.      Scheduled Meds: . amLODipine  10 mg Oral Daily  . docusate sodium  100 mg Oral BID  . insulin aspart  0-9 Units Subcutaneous Q4H  . lisinopril  10 mg Oral Daily  . piperacillin-tazobactam (ZOSYN)  IV  3.375 g Intravenous Q8H   Continuous Infusions: . lactated ringers 75 mL/hr at  02/04/16 1051     LOS: 4 days    Time spent: 25 minutes    Cordelia Poche, MD [facility]   If 7PM-7AM, please contact night-coverage [email or web address] Password [email or web address] 02/05/2016, 8:30 AM

## 2016-02-05 NOTE — Progress Notes (Signed)
1 Day Post-Op lap chole Subjective: Pain is better.  Sore at incision sites.  Objective: Vital signs in last 24 hours: Temp:  [98.4 F (36.9 C)-99.2 F (37.3 C)] 98.9 F (37.2 C) (07/31 0428) Pulse Rate:  [86-110] 86 (07/31 0428) Resp:  [18-34] 20 (07/31 0428) BP: (157-204)/(83-105) 173/94 (07/31 0428) SpO2:  [91 %-97 %] 91 % (07/31 0428)   Intake/Output from previous day: 07/30 0701 - 07/31 0700 In: 3405.2 [I.V.:3202.5; IV Piggyback:142.7] Out: 855 [Urine:650; Drains:185; Blood:20] Intake/Output this shift: Total I/O In: 1505.2 [I.V.:1302.5; Other:60; IV Piggyback:142.7] Out: 250 [Urine:250]   General appearance: alert and cooperative GI: soft, appropriately tender  Incision: no significant drainage  JP: SS fluid  Lab Results:   Recent Labs  02/03/16 0846 02/05/16 0411  WBC 12.5* 9.5  HGB 12.8* 12.1*  HCT 37.7* 36.6*  PLT 235 270   BMET  Recent Labs  02/03/16 0846 02/05/16 0411  NA 136 134*  K 4.0 3.7  CL 101 102  CO2 26 22  GLUCOSE 110* 111*  BUN 12 10  CREATININE 0.91 0.83  CALCIUM 8.4* 8.2*   PT/INR No results for input(s): LABPROT, INR in the last 72 hours. ABG No results for input(s): PHART, HCO3 in the last 72 hours.  Invalid input(s): PCO2, PO2  MEDS, Scheduled . amLODipine  10 mg Oral Daily  . atorvastatin  10 mg Oral q1800  . insulin aspart  0-9 Units Subcutaneous Q4H  . lisinopril  10 mg Oral Daily  . piperacillin-tazobactam (ZOSYN)  IV  3.375 g Intravenous Q8H    Studies/Results: No results found.  Assessment: s/p Procedure(s): LAPAROSCOPIC CHOLECYSTECTOMY Patient Active Problem List   Diagnosis Date Noted  . Elevated bilirubin   . Abnormal computed tomography of pancreas or bile duct   . Dilated bile duct   . Cholelithiasis 02/01/2016  . Elevated liver enzymes   . Cholecystitis, acute   . Metabolic syndrome [id number]  . Hyperlipidemia 09/24/2014  . History of gout 07/04/2013  . Low serum testosterone level  07/04/2013  . GE reflux 07/06/2012  . Type 2 diabetes mellitus ([place]) 03/09/2012  . History of esophageal stricture 03/09/2012  . Osteoarthritis of both knees 03/09/2012  . Obesity 03/09/2012  . Erectile dysfunction 03/02/2012  . Hypertension 03/02/2012      Plan: Continue ABX therapy due to continued treatment of cholecystitis.  I think he will need 2-3 days of IV antibiotics prior to discharge, given the amount of contamination in the OR and his DM.  He should then complete the remaining 10 day course with PO antibiotics.  JP can probably come out prior to discharge.  Ambulate in hall.  Incentive spirometer.  Low fat, diabetic diet as tolerated.   LOS: 4 days     .Rosario Adie, [facility], [place]   02/05/2016 6:31 AM

## 2016-02-05 NOTE — Progress Notes (Signed)
1 Day Post-Op  Subjective: Very sore.  Objective: Vital signs in last 24 hours: Temp:  [98.4 F (36.9 C)-99.2 F (37.3 C)] 98.9 F (37.2 C) (07/31 0428) Pulse Rate:  [86-110] 86 (07/31 0428) Resp:  [18-34] 20 (07/31 0428) BP: (157-204)/(83-105) 173/94 (07/31 0428) SpO2:  [91 %-97 %] 91 % (07/31 0428) Last BM Date: 02/02/16  Intake/Output from previous day: 07/30 0701 - 07/31 0700 In: 3405.2 [I.V.:3202.5; IV Piggyback:142.7] Out: 855 [Urine:650; Drains:185; Blood:20] Intake/Output this shift: No intake/output data recorded.  PE: General- In NAD Abdomen-soft, dressing dry, thin serosanguinous drain output  Lab Results:   Recent Labs  02/03/16 0846 02/05/16 0411  WBC 12.5* 9.5  HGB 12.8* 12.1*  HCT 37.7* 36.6*  PLT 235 270   BMET  Recent Labs  02/03/16 0846 02/05/16 0411  NA 136 134*  K 4.0 3.7  CL 101 102  CO2 26 22  GLUCOSE 110* 111*  BUN 12 10  CREATININE 0.91 0.83  CALCIUM 8.4* 8.2*   PT/INR No results for input(s): LABPROT, INR in the last 72 hours. Comprehensive Metabolic Panel:    Component Value Date/Time   NA 134 (L) 02/05/2016 0411   NA 136 02/03/2016 0846   K 3.7 02/05/2016 0411   K 4.0 02/03/2016 0846   CL 102 02/05/2016 0411   CL 101 02/03/2016 0846   CO2 22 02/05/2016 0411   CO2 26 02/03/2016 0846   BUN 10 02/05/2016 0411   BUN 12 02/03/2016 0846   CREATININE 0.83 02/05/2016 0411   CREATININE 0.91 02/03/2016 0846   CREATININE 1.17 05/19/2015 1124   CREATININE 0.85 04/27/2015 1211   GLUCOSE 111 (H) 02/05/2016 0411   GLUCOSE 110 (H) 02/03/2016 0846   CALCIUM 8.2 (L) 02/05/2016 0411   CALCIUM 8.4 (L) 02/03/2016 0846   AST 60 (H) 02/05/2016 0411   AST 29 02/03/2016 0846   ALT 83 (H) 02/05/2016 0411   ALT 97 (H) 02/03/2016 0846   ALKPHOS 86 02/05/2016 0411   ALKPHOS 112 02/03/2016 0846   BILITOT 1.6 (H) 02/05/2016 0411   BILITOT 2.1 (H) 02/03/2016 0846   PROT 7.1 02/05/2016 0411   PROT 7.5 02/03/2016 0846   ALBUMIN 3.1 (L)  02/05/2016 0411   ALBUMIN 3.4 (L) 02/03/2016 0846     Studies/Results: No results found.  Anti-infectives: Anti-infectives    Start     Dose/Rate Route Frequency Ordered Stop   02/01/16 1400  piperacillin-tazobactam (ZOSYN) IVPB 3.375 g     3.375 g 12.5 mL/hr over 240 Minutes Intravenous Every 8 hours 02/01/16 0516     02/01/16 0530  piperacillin-tazobactam (ZOSYN) IVPB 3.375 g     3.375 g 100 mL/hr over 30 Minutes Intravenous  Once 02/01/16 0516 02/01/16 [id number]      Assessment Acute gangrenous cholecystitis s/p lap chole (Dr. Marcello Moores) 02/04/16-LFTs and WBC down  Diabetes Mellitus-cbgs well controlled   LOS: 4 days   Plan: Continue IV abxs and drain.   Thedford Bunton J 02/05/2016

## 2016-02-06 LAB — GLUCOSE, CAPILLARY
GLUCOSE-CAPILLARY: 96 mg/dL (ref 65–99)
GLUCOSE-CAPILLARY: 121 mg/dL — AB (ref 65–99)
Glucose-Capillary: 102 mg/dL — ABNORMAL HIGH (ref 65–99)
Glucose-Capillary: 122 mg/dL — ABNORMAL HIGH (ref 65–99)
Glucose-Capillary: 142 mg/dL — ABNORMAL HIGH (ref 65–99)
Glucose-Capillary: 142 mg/dL — ABNORMAL HIGH (ref 65–99)

## 2016-02-06 MED ORDER — POLYETHYLENE GLYCOL 3350 17 G PO PACK
17.0000 g | PACK | Freq: Every day | ORAL | Status: DC | PRN
  Administered 2016-02-06: 17 g via ORAL
  Filled 2016-02-06: qty 1

## 2016-02-06 NOTE — Progress Notes (Signed)
2 Days Post-Op  Subjective: Feels better today but is constipated.  Tolerating diet.  Objective: Vital signs in last 24 hours: Temp:  [98.4 F (36.9 C)-99 F (37.2 C)] 99 F (37.2 C) (08/01 0630) Pulse Rate:  [80-92] 80 (08/01 0630) Resp:  [18-20] 18 (08/01 0630) BP: (137-160)/(80-85) 160/83 (08/01 0630) SpO2:  [96 %-97 %] 96 % (08/01 0630) Weight:  [133.4 kg (294 lb)] 133.4 kg (294 lb) (08/01 0630) Last BM Date: 02/05/16  Intake/Output from previous day: 07/31 0701 - 08/01 0700 In: 480 [P.O.:480] Out: 345 [Urine:300; Drains:45] Intake/Output this shift: No intake/output data recorded.  PE: General- In NAD Abdomen-soft, dressings dry, thin serosanguinous drain output  Lab Results:   Recent Labs  02/05/16 0411  WBC 9.5  HGB 12.1*  HCT 36.6*  PLT 270   BMET  Recent Labs  02/05/16 0411  NA 134*  K 3.7  CL 102  CO2 22  GLUCOSE 111*  BUN 10  CREATININE 0.83  CALCIUM 8.2*   PT/INR No results for input(s): LABPROT, INR in the last 72 hours. Comprehensive Metabolic Panel:    Component Value Date/Time   NA 134 (L) 02/05/2016 0411   NA 136 02/03/2016 0846   K 3.7 02/05/2016 0411   K 4.0 02/03/2016 0846   CL 102 02/05/2016 0411   CL 101 02/03/2016 0846   CO2 22 02/05/2016 0411   CO2 26 02/03/2016 0846   BUN 10 02/05/2016 0411   BUN 12 02/03/2016 0846   CREATININE 0.83 02/05/2016 0411   CREATININE 0.91 02/03/2016 0846   CREATININE 1.17 05/19/2015 1124   CREATININE 0.85 04/27/2015 1211   GLUCOSE 111 (H) 02/05/2016 0411   GLUCOSE 110 (H) 02/03/2016 0846   CALCIUM 8.2 (L) 02/05/2016 0411   CALCIUM 8.4 (L) 02/03/2016 0846   AST 60 (H) 02/05/2016 0411   AST 29 02/03/2016 0846   ALT 83 (H) 02/05/2016 0411   ALT 97 (H) 02/03/2016 0846   ALKPHOS 86 02/05/2016 0411   ALKPHOS 112 02/03/2016 0846   BILITOT 1.6 (H) 02/05/2016 0411   BILITOT 2.1 (H) 02/03/2016 0846   PROT 7.1 02/05/2016 0411   PROT 7.5 02/03/2016 0846   ALBUMIN 3.1 (L) 02/05/2016 0411   ALBUMIN 3.4 (L) 02/03/2016 0846     Studies/Results: No results found.  Anti-infectives: Anti-infectives    Start     Dose/Rate Route Frequency Ordered Stop   02/01/16 1400  piperacillin-tazobactam (ZOSYN) IVPB 3.375 g     3.375 g 12.5 mL/hr over 240 Minutes Intravenous Every 8 hours 02/01/16 0516     02/01/16 0530  piperacillin-tazobactam (ZOSYN) IVPB 3.375 g     3.375 g 100 mL/hr over 30 Minutes Intravenous  Once 02/01/16 0516 02/01/16 [id number]      Assessment Acute gangrenous cholecystitis s/p lap chole (Dr. Marcello Moores) 02/04/16-improving  Diabetes Mellitus-cbgs well controlled   LOS: 5 days   Plan: Continue IV abxs for one more day then can switch to oral abxs and likely discharge tomorrow.   [name] [name] 02/06/2016

## 2016-02-06 NOTE — Consult Note (Signed)
   [facility] CM Inpatient Consult   02/06/2016  HAEDYN ODLE April 22, 1951 [id number]    Went to bedside to speak with Mr. Dower on behalf of Link to [facility] Care Management program for [facility] employees/dependents with [facility] insurance. Explained Link to [employer] for DM and HTN management. He states " can I think about it?" Complete packet left with Mr. Traverse to review. Made him aware that he will still receive a post discharge follow up call and Telephonic RNCM can follow up to see as well if he is interested in the Link to Wellness program. Confirmed best contact number as [phone] (home) or (cell) [phone]. Appreciative of visit.    Marthenia Rolling, MSN-Ed, RN,BSN [facility] Liaison [phone]

## 2016-02-06 NOTE — Care Management Note (Signed)
Case Management Note  Patient Details  Name: [name] [name] MRN: [id number] Date of Birth: 11/22/1950  Subjective/Objective:   65 yo admitted with Cholecystitis                 Action/Plan: Pt from home with spouse. Chart reviewed and no CM needs identified or communicated. CM will continue to follow.   Expected Discharge Date:                  Expected Discharge Plan:  Home/Self Care  In-House Referral:     Discharge planning Services  CM Consult  Post Acute Care Choice:    Choice offered to:     DME Arranged:    DME Agency:     HH Arranged:    HH Agency:     Status of Service:  In process, will continue to follow  If discussed at Long Length of Stay Meetings, dates discussed:    Additional CommentsLynnell Catalan, RN 02/06/2016, 11:33 AM  [phone]

## 2016-02-06 NOTE — Progress Notes (Signed)
PROGRESS NOTE    [name] [name]  [id number] DOB: March 20, 1951 DOA: 01/31/2016 PCP: Elby Showers, MD     Brief Narrative: HPI as per Dr. Hal Hope: HPI: [name] [name] is a 65 y.o. male with history of diabetes mellitus type 2, hypertension, hyperlipidemia presents to the ER because of persistent abdominal pain since yesterday morning. Patient's pain is mostly in the epigastric area. Denies any radiation. Denies any associated shortness of breath or chest pain. Denies any nausea vomiting but has been having at least 5 episodes of loose stools. In the ER patient's labs show elevated LFTs with sonogram showing gallstones and dilated pancreatic duct. Patient denies any fever or chills. Patient is being admitted for further management of abdominal pain with gallstones and possible obstruction in the pancreatic duct.   ED Course: Was given pain relief medications. Has sonogram as discussed above. With labs showing elevated LFTs.  Since admission, patient's labs have trended down.  MRCP appears to show obstructing stone however ERCP not significant for stones. General surgery took him to the OR for laparoscopic cholecystectomy on 7/30. He has remained afebrile for >48 and has had adequate pain control. Zosyn to be continued for at least 2-3 days post op per general surgery recommendations.  Assessment & Plan:   Principal Problem:   Cholecystitis, acute Active Problems:   Hypertension   Type 2 diabetes mellitus (HCC)   Cholelithiasis   Elevated liver enzymes   Abnormal computed tomography of pancreas or bile duct   Dilated bile duct   Elevated bilirubin  Acute cholecystitis with cholelithiasis S/p cholecystectomy on 7/30. AST with slight bump, but overall trending down. Bilirubin trending down -Persistently elevated WBC count without fever -On Zosyn (7/27 >> -Currently adequate pain control -CMP in AM  Constipation -continue colace BID -add miralax daily prn  HTN Better  control since restarting. May need to increase lisinopril if no improvement -continue home Lisinopril 10mg  and amlodipine 10mg   DM No hypoglycemia. Maximum blood glucose of 163 last 24 hours, 1 unit SSI -Continue to follow with SSI  Hyperlipidemia -hold home atorvastatin 10mg  daily until LFTs normalize   DVT prophylaxis: SCDs. Code Status: Full code.  Family Communication: Discussed with patient.  Disposition Plan: Home.  Admission status: Inpatient. MedSurg.   Consultants:   General Surgery  GI  Procedures:  MRCP  ERCP (7/28)  Cholecystectomy (7/30)  Antimicrobials:   Zosyn, started 7/27>> expected end date of 8/2 with transition to PO   Subjective: Patient had trouble having a bowel movement yesterday. He has been using colace with no improvement. Otherwise, pain is manageable. No other concerns.  Objective: Vitals:   02/05/16 0428 02/05/16 1330 02/05/16 2205 02/06/16 0630  BP: (!) 173/94 (!) 141/85 137/80 (!) 160/83  Pulse: 86 92 85 80  Resp: 20 20 19 18   Temp: 98.9 F (37.2 C) 99 F (37.2 C) 98.4 F (36.9 C) 99 F (37.2 C)  TempSrc: Oral Oral Oral Oral  SpO2: 91% 97% 97% 96%  Weight:    133.4 kg (294 lb)  Height:        Intake/Output Summary (Last 24 hours) at 02/06/16 0735 Last data filed at 02/06/16 0630  Gross per 24 hour  Intake              480 ml  Output              345 ml  Net              135 ml  Filed Weights   02/03/16 0402 02/04/16 0500 02/06/16 0630  Weight: 132.3 kg (291 lb 9.6 oz) 132.3 kg (291 lb 9 oz) 133.4 kg (294 lb)    Examination:  General exam: Appears calm and comfortable, sleeping in bed Respiratory system: Clear to auscultation. Respiratory effort normal. Cardiovascular system: S1 & S2 heard, RRR. No murmurs, rubs, gallops or clicks. No pedal edema. Gastrointestinal system: Abdomen is nondistended, soft and tender generally but especially periumbilical. No organomegaly or masses felt. Normal bowel sounds heard. Two  dressings applied, at periumbilical and RUQ areas. Drain in place with serosanguinous fluid in bulb Central nervous system: Alert and oriented. No focal neurological deficits. Extremities: Normal tone, no calf tenderness. Skin: No rashes, lesions or ulcers Psychiatry: Judgement and insight appear normal. Mood & affect appropriate.     Data Reviewed: I have personally reviewed following labs and imaging studies  CBC:  Recent Labs Lab 01/31/16 2346 02/01/16 0550 02/02/16 0419 02/03/16 0846 02/05/16 0411  WBC 5.5 6.5 11.7* 12.5* 9.5  NEUTROABS 3.1 5.2 9.2* 10.1*  --   HGB 13.9 13.3 13.1 12.8* 12.1*  HCT 41.7 39.7 39.2 37.7* 36.6*  MCV 92.5 92.1 92.5 92.6 92.2  PLT 260 251 232 235 [id number]   Basic Metabolic Panel:  Recent Labs Lab 01/31/16 2346 02/01/16 0550 02/02/16 0419 02/03/16 0846 02/05/16 0411  NA 138 136 136 136 134*  K 3.6 4.0 3.6 4.0 3.7  CL 105 106 105 101 102  CO2 27 24 24 26 22   GLUCOSE 143* 126* 102* 110* 111*  BUN 11 10 10 12 10   CREATININE 0.86 0.84 0.96 0.91 0.83  CALCIUM 9.0 8.5* 8.4* 8.4* 8.2*   GFR: Estimated Creatinine Clearance: 125.2 mL/min (by C-G formula based on SCr of 0.83 mg/dL). Liver Function Tests:  Recent Labs Lab 01/31/16 2346 02/01/16 0550 02/02/16 0419 02/03/16 0846 02/05/16 0411  AST 278* 177* 58* 29 60*  ALT 295* 260* 155* 97* 83*  ALKPHOS 165* 163* 135* 112 86  BILITOT 3.3* 2.2* 2.6* 2.1* 1.6*  PROT 7.8 7.2 7.3 7.5 7.1  ALBUMIN 4.1 3.9 3.5 3.4* 3.1*    Recent Labs Lab 01/31/16 2346 02/01/16 0550 02/03/16 0846  LIPASE 18 21 16    No results for input(s): AMMONIA in the last 168 hours. Coagulation Profile: No results for input(s): INR, PROTIME in the last 168 hours. Cardiac Enzymes: No results for input(s): CKTOTAL, CKMB, CKMBINDEX, TROPONINI in the last 168 hours. BNP (last 3 results) No results for input(s): PROBNP in the last 8760 hours. HbA1C: No results for input(s): HGBA1C in the last 72  hours. CBG:  Recent Labs Lab 02/05/16 1147 02/05/16 1701 02/05/16 2150 02/06/16 0024 02/06/16 0417  GLUCAP 128* 113* 163* 122* 102*   Lipid Profile: No results for input(s): CHOL, HDL, LDLCALC, TRIG, CHOLHDL, LDLDIRECT in the last 72 hours. Thyroid Function Tests: No results for input(s): TSH, T4TOTAL, FREET4, T3FREE, THYROIDAB in the last 72 hours. Anemia Panel: No results for input(s): VITAMINB12, FOLATE, FERRITIN, TIBC, IRON, RETICCTPCT in the last 72 hours. Urine analysis:    Component Value Date/Time   COLORURINE AMBER (A) 02/01/2016 1700   APPEARANCEUR CLOUDY (A) 02/01/2016 1700   LABSPEC 1.039 (H) 02/01/2016 1700   PHURINE 7.0 02/01/2016 1700   GLUCOSEU NEGATIVE 02/01/2016 1700   HGBUR NEGATIVE 02/01/2016 1700   BILIRUBINUR NEGATIVE 02/01/2016 1700   BILIRUBINUR neg 02/27/2015 1021   KETONESUR NEGATIVE 02/01/2016 1700   PROTEINUR NEGATIVE 02/01/2016 1700   UROBILINOGEN negative 02/27/2015 1021   NITRITE  NEGATIVE 02/01/2016 1700   LEUKOCYTESUR NEGATIVE 02/01/2016 1700   Sepsis Labs: @LABRCNTIP (procalcitonin:4,lacticidven:4)  ) Recent Results (from the past 240 hour(s))  Surgical pcr screen     Status: None   Collection Time: 02/03/16  5:22 PM  Result Value Ref Range Status   MRSA, PCR NEGATIVE NEGATIVE Final   Staphylococcus aureus NEGATIVE NEGATIVE Final    Comment:        The Xpert SA Assay (FDA approved for NASAL specimens in patients over 60 years of age), is one component of a comprehensive surveillance program.  Test performance has been validated by [facility] for patients greater than or equal to 14 year old. It is not intended to diagnose infection nor to guide or monitor treatment.          Radiology Studies: No results found.      Scheduled Meds: . amLODipine  10 mg Oral Daily  . docusate sodium  100 mg Oral BID  . insulin aspart  0-9 Units Subcutaneous Q4H  . lisinopril  10 mg Oral Daily  . piperacillin-tazobactam (ZOSYN)   IV  3.375 g Intravenous Q8H   Continuous Infusions: . lactated ringers 75 mL/hr at 02/05/16 2212     LOS: 5 days    Time spent: 25 minutes    Cordelia Poche, MD [facility]   If 7PM-7AM, please contact night-coverage [email or web address] Password [facility] 02/06/2016, 7:35 AM

## 2016-02-06 NOTE — Discharge Instructions (Addendum)
LAPAROSCOPIC GALLBLADDER SURGERY: POST OP INSTRUCTIONS  1. DIET: Follow a lowfat  after surgery.   2. Take your usually prescribed home medications unless otherwise directed. 3. PAIN CONTROL: a. Pain is best controlled by a usual combination of three different methods TOGETHER: i. Ice/Heat ii. Over the counter pain medication iii. Prescription pain medication b. Most patients will experience some swelling and bruising around the incisions.  Ice packs or heating pads (30-60 minutes up to 6 times a day) will help. Use ice for the first few days to help decrease swelling and bruising, then switch to heat to help relax tight/sore spots and speed recovery.  Some people prefer to use ice alone, heat alone, alternating between ice & heat.  Experiment to what works for you.  Swelling and bruising can take several weeks to resolve.   c. It is helpful to take an over-the-counter pain medication regularly for the first few weeks.  Choose one of the following that works best for you: i. Naproxen (Aleve, etc)  Two 220mg  tabs twice a day ii. Ibuprofen (Advil, etc) Three 200mg  tabs four times a day (every meal & bedtime) iii. Acetaminophen (Tylenol, etc) 500-650mg  four times a day (every meal & bedtime) d. A  prescription for pain medication (such as oxycodone, hydrocodone, etc) should be given to you upon discharge.  Take your pain medication as prescribed.  i. If you are having problems/concerns with the prescription medicine (does not control pain, nausea, vomiting, rash, itching, etc), please call us [phone] to see if we need to switch you to a different pain medicine that will work better for you and/or control your side effect better. ii. If you need a refill on your pain medication, please contact your pharmacy.  They will contact our office to request authorization. Prescriptions will not be filled after 5 pm or on week-ends. iii. Take a probiotic daily while on the antibiotic. 4. Avoid getting  constipated.  Between the surgery and the pain medications, it is common to experience some constipation.  Increasing fluid intake and taking a fiber supplement (such as Metamucil, Citrucel, FiberCon, MiraLax, etc) 1-2 times a day regularly will usually help prevent this problem from occurring.  A mild laxative (prune juice, Milk of Magnesia, MiraLax, etc) should be taken according to package directions if there are no bowel movements after 48 hours.   5. Watch out for diarrhea.  If you have many loose bowel movements, simplify your diet to bland foods & liquids for a few days.  Stop any stool softeners and decrease your fiber supplement.  Switching to mild anti-diarrheal medications (Kayopectate, Pepto Bismol) can help.  If this worsens or does not improve, please call us. 6. Wash every day.  Keep drain site dry. 7. Empty drain and record output twice a day. 8. ACTIVITIES as tolerated:   a. You may resume regular (light) daily activities beginning the next day--such as daily self-care, walking, climbing stairs--gradually increasing light activities as tolerated.  No heavy lifting (over 10 pounds), straining, or intense activities for 2 weeks. b. DO NOT PUSH THROUGH PAIN.  Let pain be your guide: If it hurts to do something, don't do it.  Pain is your body warning you to avoid that activity for another week until the pain goes down. c. You may drive when you are no longer taking prescription pain medication, you can comfortably wear a seatbelt, and you can safely maneuver your car and apply brakes. d. Dennis Bast may have sexual intercourse when  it is comfortable.  9. FOLLOW UP in our office a. Please call [facility] at [phone] to set up an appointment to see your surgeon in the office for a follow-up appointment approximately 2-3 weeks after your surgery. b. Make sure that you call for this appointment the day you arrive home to insure a convenient appointment time. 10. IF YOU HAVE DISABILITY OR FAMILY LEAVE  FORMS, BRING THEM TO THE OFFICE FOR PROCESSING.  DO NOT GIVE THEM TO YOUR DOCTOR.  11.  Return to work/school:  In 2 weeks if pain-free.   WHEN TO CALL us [phone]: 1. Poor pain control 2. Reactions / problems with new medications (rash/itching, nausea, etc)  3. Fever over 101.5 F (38.5 C) 4. Inability to urinate 5. Nausea and/or vomiting 6. Worsening swelling or bruising 7. Continued bleeding from incision. 8. Increased pain, redness, or drainage from the incision   The clinic staff is available to answer your questions during regular business hours (8:30am-5pm).  Please dont hesitate to call and ask to speak to one of our nurses for clinical concerns.   If you have a medical emergency, go to the nearest emergency room or call 911.  A surgeon from [facility] is always on call at the [facility], [place], [place], [place], [place]  [id number] ? MAIN: [phone] ? TOLL FREE: [phone] ?  FAX [phone]) [phone] [email or web address]  Bulb Drain Home Care A bulb drain consists of a thin rubber tube and a soft, round bulb that creates a gentle suction. The rubber tube is placed in the area where you had surgery. A bulb is attached to the end of the tube that is outside the body. The bulb drain removes excess fluid that normally builds up in a surgical wound after surgery. The color and amount of fluid will vary. Immediately after surgery, the fluid is bright red and is a little thicker than water. It may gradually change to a yellow or pink color and become more thin and water-like. When the amount decreases to about 1 or 2 tbsp in 24 hours, your health care provider will usually remove it. DAILY CARE  Keep the bulb flat (compressed) at all times, except while emptying it. The flatness creates suction. You can flatten the bulb by squeezing it firmly in the middle and then closing the cap.  Keep sites where the tube  enters the skin dry and covered with a bandage (dressing).  Secure the tube 1-2 in (2.5-5.1 cm) below the insertion sites to keep it from pulling on your stitches. The tube is stitched in place and will not slip out.  Secure the bulb as directed by your health care provider.  For the first 3 days after surgery, there usually is more fluid in the bulb. Empty the bulb whenever it becomes half full because the bulb does not create enough suction if it is too full. The bulb could also overflow. Write down how much fluid you remove each time you empty your drain. Add up the amount removed in 24 hours.  Empty the bulb at the same time every day once the amount of fluid decreases and you only need to empty it once a day. Write down the amounts and the 24-hour totals to give to your health care provider. This helps your health care provider know when the tubes can be removed. EMPTYING THE BULB DRAIN Before emptying the bulb, get a measuring cup, a  piece of paper and a pen, and wash your hands.  Gently run your fingers down the tube (stripping) to empty any drainage from the tubing into the bulb. This may need to be done several times a day to clear the tubing of clots and tissue.  Open the bulb cap to release suction, which causes it to inflate. Do not touch the inside of the cap.  Gently run your fingers down the tube (stripping) to empty any drainage from the tubing into the bulb.  Hold the cap out of the way, and pour fluid into the measuring cup.   Squeeze the bulb to provide suction.  Replace the cap.   Check the tape that holds the tube to your skin. If it is becoming loose, you can remove the loose piece of tape and apply a new one. Then, pin the bulb to your shirt.   Write down the amount of fluid you emptied out. Write down the date and each time you emptied your bulb drain. (If there are 2 bulbs, note the amount of drainage from each bulb and keep the totals separate. Your health care  provider will want to know the total amounts for each drain and which tube is draining more.)   Flush the fluid down the toilet and wash your hands.   Call your health care provider once you have less than 2 tbsp of fluid collecting in the bulb drain every 24 hours. If there is drainage around the tube site, change dressings and keep the area dry. Cleanse around tube with sterile saline and place dry gauze around site. This gauze should be changed when it is soiled. If it stays clean and unsoiled, it should still be changed daily.  SEEK MEDICAL CARE IF:  Your drainage has a bad smell or is cloudy.   You have a fever.   Your drainage is increasing instead of decreasing.   Your tube fell out.   You have redness or swelling around the tube site.   You have drainage from a surgical wound.   Your bulb drain will not stay flat after you empty it.  MAKE SURE YOU:   Understand these instructions.  Will watch your condition.  Will get help right away if you are not doing well or get worse.   This information is not intended to replace advice given to you by your health care provider. Make sure you discuss any questions you have with your health care provider.   Document Released: 06/21/2000 Document Revised: 07/15/2014 Document Reviewed: 01/11/2015 Elsevier Interactive Patient Education [employer].

## 2016-02-07 LAB — COMPREHENSIVE METABOLIC PANEL
ALK PHOS: 81 U/L (ref 38–126)
ALT: 74 U/L — AB (ref 17–63)
ANION GAP: 8 (ref 5–15)
AST: 44 U/L — ABNORMAL HIGH (ref 15–41)
Albumin: 2.7 g/dL — ABNORMAL LOW (ref 3.5–5.0)
BILIRUBIN TOTAL: 1.2 mg/dL (ref 0.3–1.2)
BUN: 11 mg/dL (ref 6–20)
CALCIUM: 8.3 mg/dL — AB (ref 8.9–10.3)
CO2: 26 mmol/L (ref 22–32)
CREATININE: 0.8 mg/dL (ref 0.61–1.24)
Chloride: 102 mmol/L (ref 101–111)
Glucose, Bld: 110 mg/dL — ABNORMAL HIGH (ref 65–99)
Potassium: 3.6 mmol/L (ref 3.5–5.1)
SODIUM: 136 mmol/L (ref 135–145)
TOTAL PROTEIN: 6.8 g/dL (ref 6.5–8.1)

## 2016-02-07 LAB — CBC WITH DIFFERENTIAL/PLATELET
BASOS ABS: 0.1 10*3/uL (ref 0.0–0.1)
Basophils Relative: 1 %
EOS ABS: 0.2 10*3/uL (ref 0.0–0.7)
Eosinophils Relative: 3 %
HCT: 34.3 % — ABNORMAL LOW (ref 39.0–52.0)
HEMOGLOBIN: 11.4 g/dL — AB (ref 13.0–17.0)
LYMPHS PCT: 27 %
Lymphs Abs: 1.9 10*3/uL (ref 0.7–4.0)
MCH: 30.4 pg (ref 26.0–34.0)
MCHC: 33.2 g/dL (ref 30.0–36.0)
MCV: 91.5 fL (ref 78.0–100.0)
MONO ABS: 1.1 10*3/uL — AB (ref 0.1–1.0)
Monocytes Relative: 15 %
NEUTROS PCT: 54 %
Neutro Abs: 3.8 10*3/uL (ref 1.7–7.7)
PLATELETS: 328 10*3/uL (ref 150–400)
RBC: 3.75 MIL/uL — ABNORMAL LOW (ref 4.22–5.81)
RDW: 13.5 % (ref 11.5–15.5)
WBC: 7.1 10*3/uL (ref 4.0–10.5)

## 2016-02-07 LAB — GLUCOSE, CAPILLARY
GLUCOSE-CAPILLARY: 103 mg/dL — AB (ref 65–99)
GLUCOSE-CAPILLARY: 108 mg/dL — AB (ref 65–99)
Glucose-Capillary: 110 mg/dL — ABNORMAL HIGH (ref 65–99)

## 2016-02-07 MED ORDER — CIPROFLOXACIN HCL 500 MG PO TABS: 500.0000 mg | ORAL_TABLET | Freq: Two times a day (BID) | ORAL | 0 refills | Status: DC

## 2016-02-07 MED ORDER — HYDROCODONE-ACETAMINOPHEN 10-325 MG PO TABS: 1.0000 | ORAL_TABLET | ORAL | 0 refills | Status: DC | PRN

## 2016-02-07 MED ORDER — AMOXICILLIN-POT CLAVULANATE 875-125 MG PO TABS: 1.0000 | ORAL_TABLET | Freq: Two times a day (BID) | ORAL | 0 refills | Status: DC

## 2016-02-07 MED ORDER — TRAMADOL HCL 50 MG PO TABS: 50.0000 mg | ORAL_TABLET | Freq: Four times a day (QID) | ORAL | 0 refills | Status: DC | PRN

## 2016-02-07 MED ORDER — TRAMADOL HCL 50 MG PO TABS: 50.0000 mg | ORAL_TABLET | Freq: Four times a day (QID) | ORAL | Status: DC | PRN

## 2016-02-07 MED FILL — AMOX-CLAV 875-125 MG TABLET: 875-125 | 7 days supply | Qty: 14 | Fill #0

## 2016-02-07 MED FILL — HYDROCODON-APAP 10-325: 10-325 | 3 days supply | Qty: 30 | Fill #0

## 2016-02-07 NOTE — Progress Notes (Signed)
Nursing Discharge Summary  Patient ID: [name] [name] MRN: [id number] DOB/AGE: 63-20-1952 63 y.o.  Admit date: 01/31/2016 Discharge date: 02/07/2016  Discharged Condition: good  Disposition:   Follow-up Information    [facility] Follow up on 02/12/2016.   Specialty:  General Surgery Why:  You will see the nurse to have your drain pulled.  Your appointment is at [place] [place], be at the office at 9:30 AM.  You have a second appointment at the [facility], same place.  on 02/21/16, be at the office at 10:15 AM for check in.  Appointment is at 10:45 AM Contact information: [address] [place] [id number] [phone]        Elby Showers, MD .   Specialty:  Internal Medicine Why:  Call for follow up for medical issues Contact information: [phone] [phone]           Prescriptions Given: Prescription for Augmentin called into outpatient pharmacy.  Script given for [employer]  Patient follow up appointments, medications, and after care discussed.    Means of Discharge: Patient to be taken downstairs via wheelchair to be discharged home.    Signed: Buel Ream 02/07/2016, 12:20 PM

## 2016-02-07 NOTE — Discharge Summary (Signed)
Physician Discharge Summary  [name] [name] [id number] DOB: 12-27-1950 DOA: 01/31/2016  PCP: Elby Showers, MD  Admit date: 01/31/2016 Discharge date: 02/07/2016  Recommendations for Outpatient Follow-up:  Pt will need to follow up with PCP as scheduled.   Discharge Diagnoses:  Principal Problem:   Cholecystitis, acute Active Problems:  Discharge Condition: Stable  Diet recommendation: Heart healthy diet discussed in details   History of present illness:  65 y.o.malewith history of diabetes mellitus type 2, hypertension, hyperlipidemia presented to the ER because of persistent abdominal pain one day in duration. Patient's pain was mostly in the epigastric area. Denied any radiation, no associated shortness of breath or chest pain. Denied any nausea vomiting but has been having at least 5 episodes of loose stools. In the ER patient's labs showed elevated LFTs with sonogram showing gallstones and dilated pancreatic duct. Patient was being admitted for further management of abdominal pain with gallstones and possible obstruction in the pancreatic duct.  Since admission, patient's labs have trended down.  MRCP showed obstructing stone however ERCP not significant for stones. General surgery took him to the OR for laparoscopic cholecystectomy on 7/30. He has remained afebrile for >48 and has had adequate pain control. Zosyn to be continued for at least 2-3 days post op per general surgery recommendations.  Hospital Course:  Acute cholecystitis with cholelithiasis - S/p cholecystectomy on 7/30. - overall better and tolerating diet well  - no pain this AM, was on IV ABX but changed to PO Cipro per surgery team recommendations to complete therapy   Constipation - had BM this AM  HTN, essential  - stable and can therefore continue home medical regimen   DM - continue home medical regimen   Hyperlipidemia - continue statin per home medical regimen    DVT  prophylaxis:SCDs. Code Status:Full code. Family Communication:Discussed with patient. Disposition Plan:Home.  Consultants:   General Surgery  GI  Procedures:  MRCP  ERCP (7/28)  Cholecystectomy (7/30)  Procedures/Studies: US Abdomen Complete  Result Date: 02/01/2016 CLINICAL DATA:  Sudden onset of right upper quadrant abdominal pain and nausea. History of hypertension, diabetes and GERD. EXAM: ABDOMEN ULTRASOUND COMPLETE COMPARISON:  None. FINDINGS: Gallbladder: There are several small gallstones an layering echogenic sludge within an otherwise normal-appearing gallbladder. Dominant echogenic gallstone within the neck of the gallbladder measures approximately 0.9 cm in diameter (image 3). No definitive gallbladder wall thickening or pericholecystic fluid. Common bile duct: Diameter: Normal in size measuring 3 mm in diameter Liver: There is mild diffuse slightly increased and coarsened echogenicity of the hepatic parenchyma. No discrete hepatic lesions. No definite evidence of intrahepatic biliary ductal dilatation. No ascites. IVC: No abnormality visualized. Pancreas: Query mild dilatation of the pancreatic duct measuring approximately 3 mm in diameter (image 54), the etiology of which is not depicted on this examination. Spleen: Normal in size measuring 5.4 cm in length Right Kidney: Normal cortical thickness, echogenicity and size, measuring 12.4 cm in length. No focal renal lesions. No echogenic renal stones. No urinary obstruction. Left Kidney: Normal cortical thickness, echogenicity and size, measuring 12.7 cm in length. No focal renal lesions. No echogenic renal stones. No urinary obstruction. Abdominal aorta: Only seen proximally, however the proximal aorta is of normal caliber. The mid and distal aspects of the abdominal aorta obscured by bowel gas. Other findings: None. IMPRESSION: 1. Cholelithiasis without evidence of cholecystitis. If clinical concern persists for acute  cholecystitis, further evaluation could be performed with nuclear medicine HIDA scan as clinically indicated. 2.  Potential mild dilatation of the pancreatic duct measuring approximately 3 mm in diameter, the etiology of which is not depicted on this examination. Further evaluation with nonemergent MRCP could be performed as clinically indicated. 3. Suspected hepatic steatosis. Electronically Signed   By: Sandi Mariscal M.D.   On: 02/01/2016 02:37  Mr 3d Recon At Scanner  Result Date: 02/01/2016 CLINICAL DATA:  65 year old male with abdominal pain and elevated bili Rubin. Possible dilatation of the pancreatic duct noted on prior ultrasound examination. EXAM: MRI ABDOMEN WITHOUT AND WITH CONTRAST (INCLUDING MRCP) TECHNIQUE: Multiplanar multisequence MR imaging of the abdomen was performed both before and after the administration of intravenous contrast. Heavily T2-weighted images of the biliary and pancreatic ducts were obtained, and three-dimensional MRCP images were rendered by post processing. CONTRAST:  14mL MULTIHANCE GADOBENATE DIMEGLUMINE 529 MG/ML IV SOLN COMPARISON:  No prior abdominal MRI. Abdominal ultrasound 02/01/2016. FINDINGS: Lower chest:  Mild cardiomegaly. Hepatobiliary: Diffuse loss of signal intensity throughout the hepatic parenchyma on out of phase dual echo images. No suspicious cystic or solid hepatic lesions. No intrahepatic biliary ductal dilatation. MRCP images are limited by extensive patient respiratory motion. With these limitations in mind, there are some filling defects within the gallbladder, compatible with gallstones. Common bile duct does appear mildly dilated measuring 8 mm in the porta hepatis. There is a suggestion of some filling defects in the common bile duct on MRCP images (image 79 of series 5), which could indicate choledocholithiasis, however, this cannot be corroborated reliably on additional pulse sequences. Pancreas: No pancreatic mass. No pancreatic ductal dilatation  on MRCP images. No pancreatic or peripancreatic fluid or inflammatory changes. Spleen: Unremarkable. Adrenals/Urinary Tract: Bilateral kidneys and bilateral adrenal glands are normal in appearance. No hydroureteronephrosis in the visualized abdomen. Stomach/Bowel: Visualized portions are unremarkable. Vascular/Lymphatic: No aneurysm identified in the visualized abdominal vasculature. No lymphadenopathy noted in the abdomen. Other: No significant volume of ascites in the visualized peritoneal cavity. Musculoskeletal: No aggressive osseous lesions are noted in the visualized portions of the skeleton. IMPRESSION: 1. No pancreatic ductal dilatation. 2. Mild common bile duct dilatation (8 mm in diameter). There is a suggestion of filling defects in the common bile duct, which is concerning for potential choledocholithiasis. At this time, there is no intrahepatic biliary ductal dilatation to suggest frank biliary tract obstruction. Today's study was slightly limited by extensive patient motion. Correlation with ERCP may be warranted if clinically appropriate. 3. Cholelithiasis without evidence of acute cholecystitis. Hepatic steatosis. 4. Additional incidental findings, as above. Electronically Signed   By: Vinnie Langton M.D.   On: 02/01/2016 14:46  Dg Ercp Biliary & Pancreatic Ducts  Result Date: 02/02/2016 CLINICAL DATA:  65 year old male with choledocholithiasis EXAM: ERCP TECHNIQUE: Multiple spot images obtained with the fluoroscopic device and submitted for interpretation post-procedure. FLUOROSCOPY TIME:  Please see GI operative note for detail COMPARISON:  MRCP 02/01/2016 FINDINGS: Fluoroscopic images demonstrate a flexible endoscope in the descending duodenum and cannulation of the common bile duct. Subsequent images demonstrate mild biliary ductal dilatation. Question of filling defects in the distal common bile duct consistent with choledocholithiasis. Subsequent images document sphincterotomy and balloon  sweep of the common duct. IMPRESSION: 1. Mild biliary ductal dilatation with probable choledocholithiasis. 2. The images demonstrate sphincterotomy and balloon sweep of the common duct. These images were submitted for radiologic interpretation only. Please see the procedural report for the amount of contrast and the fluoroscopy time utilized. Electronically Signed   By: Jacqulynn Cadet M.D.   On: 02/02/2016 13:15  Mr Jeananne Rama W/wo Cm/mrcp  Result Date: 02/01/2016 CLINICAL DATA:  65 year old male with abdominal pain and elevated bili Rubin. Possible dilatation of the pancreatic duct noted on prior ultrasound examination. EXAM: MRI ABDOMEN WITHOUT AND WITH CONTRAST (INCLUDING MRCP) TECHNIQUE: Multiplanar multisequence MR imaging of the abdomen was performed both before and after the administration of intravenous contrast. Heavily T2-weighted images of the biliary and pancreatic ducts were obtained, and three-dimensional MRCP images were rendered by post processing. CONTRAST:  87mL MULTIHANCE GADOBENATE DIMEGLUMINE 529 MG/ML IV SOLN COMPARISON:  No prior abdominal MRI. Abdominal ultrasound 02/01/2016. FINDINGS: Lower chest:  Mild cardiomegaly. Hepatobiliary: Diffuse loss of signal intensity throughout the hepatic parenchyma on out of phase dual echo images. No suspicious cystic or solid hepatic lesions. No intrahepatic biliary ductal dilatation. MRCP images are limited by extensive patient respiratory motion. With these limitations in mind, there are some filling defects within the gallbladder, compatible with gallstones. Common bile duct does appear mildly dilated measuring 8 mm in the porta hepatis. There is a suggestion of some filling defects in the common bile duct on MRCP images (image 79 of series 5), which could indicate choledocholithiasis, however, this cannot be corroborated reliably on additional pulse sequences. Pancreas: No pancreatic mass. No pancreatic ductal dilatation on MRCP images. No pancreatic or  peripancreatic fluid or inflammatory changes. Spleen: Unremarkable. Adrenals/Urinary Tract: Bilateral kidneys and bilateral adrenal glands are normal in appearance. No hydroureteronephrosis in the visualized abdomen. Stomach/Bowel: Visualized portions are unremarkable. Vascular/Lymphatic: No aneurysm identified in the visualized abdominal vasculature. No lymphadenopathy noted in the abdomen. Other: No significant volume of ascites in the visualized peritoneal cavity. Musculoskeletal: No aggressive osseous lesions are noted in the visualized portions of the skeleton. IMPRESSION: 1. No pancreatic ductal dilatation. 2. Mild common bile duct dilatation (8 mm in diameter). There is a suggestion of filling defects in the common bile duct, which is concerning for potential choledocholithiasis. At this time, there is no intrahepatic biliary ductal dilatation to suggest frank biliary tract obstruction. Today's study was slightly limited by extensive patient motion. Correlation with ERCP may be warranted if clinically appropriate. 3. Cholelithiasis without evidence of acute cholecystitis. Hepatic steatosis. 4. Additional incidental findings, as above. Electronically Signed   By: Vinnie Langton M.D.   On: 02/01/2016 14:46   Discharge Exam: Vitals:   02/06/16 2204 02/07/16 0536  BP: (!) 151/74 106/68  Pulse: 75 73  Resp: 19 18  Temp: 98.2 F (36.8 C) 98.4 F (36.9 C)   Vitals:   02/06/16 0630 02/06/16 1348 02/06/16 2204 02/07/16 0536  BP: (!) 160/83 (!) 150/84 (!) 151/74 106/68  Pulse: 80 93 75 73  Resp: 18 20 19 18   Temp: 99 F (37.2 C) 98.1 F (36.7 C) 98.2 F (36.8 C) 98.4 F (36.9 C)  TempSrc: Oral Oral Oral Oral  SpO2: 96% 98% 98% 96%  Weight: 133.4 kg (294 lb)   132.9 kg (293 lb)  Height:        General: Pt is alert, follows commands appropriately, not in acute distress Respiratory: Clear to auscultation bilaterally, no wheezing, no crackles, no rhonchi Abdominal: Soft, non tender, non  distended, bowel sounds +, no guarding Extremities: no edema, no cyanosis, pulses palpable bilaterally DP and PT Neuro: Grossly nonfocal  Discharge Instructions  Discharge Instructions    AMB Referral to [facility] Care Management    Complete by:  As directed   Please assign UMR member for post toc. Please follow up on whether interested in Link to Wellness program for  DM. Packet was provided. However, wanted to think about it first before signing up for LTW. Thanks and please call with questions. Marthenia Rolling, MSN-Ed, [facility] Liaison-[phone]   Reason for consult:  Please assign UMR member for post toc call. To discharge home today 02/06/16 likely   Diagnoses of:  Diabetes   Expected date of contact:  1-3 days (reserved for hospital discharges)   Diet - low sodium heart healthy    Complete by:  As directed   Increase activity slowly    Complete by:  As directed       Medication List    STOP taking these medications   aspirin-sod bicarb-citric acid 325 MG Tbef tablet Commonly known as:  ALKA-SELTZER   ibuprofen 200 MG tablet Commonly known as:  ADVIL,MOTRIN     TAKE these medications   amLODipine 10 MG tablet Commonly known as:  NORVASC Take 10 mg by mouth daily.   atorvastatin 10 MG tablet Commonly known as:  LIPITOR Take 1 tablet (10 mg total) by mouth daily.   bismuth subsalicylate [id number] suspension Commonly known as:  PEPTO BISMOL Take 30 mLs by mouth every 6 (six) hours as needed for indigestion.   ciprofloxacin 500 MG tablet Commonly known as:  CIPRO Take 1 tablet (500 mg total) by mouth 2 (two) times daily.   esomeprazole 40 MG capsule Commonly known as:  NEXIUM TAKE 1 CAPSULE BY MOUTH ONCE DAILY   furosemide 40 MG tablet Commonly known as:  LASIX Take 1 tablet (40 mg total) by mouth daily.   lisinopril 10 MG tablet Commonly known as:  PRINIVIL,ZESTRIL TAKE 1 TABLET BY MOUTH DAILY.   meloxicam 15 MG tablet Commonly known as:   MOBIC Take 1 tablet (15 mg total) by mouth daily.   metFORMIN 500 MG 24 hr tablet Commonly known as:  GLUCOPHAGE-XR TAKE 1 TABLET BY MOUTH DAILY WITH BREAKFAST.   traMADol 50 MG tablet Commonly known as:  ULTRAM Take 1 tablet (50 mg total) by mouth every 6 (six) hours as needed for moderate pain.   VIAGRA 100 MG tablet Generic drug:  sildenafil TAKE 1 TABLET BY MOUTH ONCE DAILY AS NEEDED      Follow-up Information    [facility] Follow up on 02/12/2016.   Specialty:  General Surgery Why:  You will see the nurse to have your drain pulled.  Your appointment is at [place], be at the office at 9:30 AM.  You have a second appointment at the [place] clinic, same place.  on 02/21/16, be at the office at 10:15 AM for check in.  Appointment is at 10:45 AM Contact information: [address] [place] [address] [phone]        Elby Showers, MD .   Specialty:  Internal Medicine Why:  Call for follow up for medical issues Contact information: [address] [place] [phone]            The results of significant diagnostics from this hospitalization (including imaging, microbiology, ancillary and laboratory) are listed below for reference.     Microbiology: Recent Results (from the past 240 hour(s))  Surgical pcr screen     Status: None   Collection Time: 02/03/16  5:22 PM  Result Value Ref Range Status   MRSA, PCR NEGATIVE NEGATIVE Final   Staphylococcus aureus NEGATIVE NEGATIVE Final    Comment:        The Xpert SA Assay (FDA approved for NASAL  specimens in patients over 49 years of age), is one component of a comprehensive surveillance program.  Test performance has been validated by [facility] for patients greater than or equal to 43 year old. It is not intended to diagnose infection nor to guide or monitor treatment.      Labs: Basic Metabolic Panel:  Recent Labs Lab 02/01/16 0550 02/02/16 0419  02/03/16 0846 02/05/16 0411 02/07/16 0404  NA 136 136 136 134* 136  K 4.0 3.6 4.0 3.7 3.6  CL 106 105 101 102 102  CO2 24 24 26 22 26   GLUCOSE 126* 102* 110* 111* 110*  BUN 10 10 12 10 11   CREATININE 0.84 0.96 0.91 0.83 0.80  CALCIUM 8.5* 8.4* 8.4* 8.2* 8.3*   Liver Function Tests:  Recent Labs Lab 02/01/16 0550 02/02/16 0419 02/03/16 0846 02/05/16 0411 02/07/16 0404  AST 177* 58* 29 60* 44*  ALT 260* 155* 97* 83* 74*  ALKPHOS 163* 135* 112 86 81  BILITOT 2.2* 2.6* 2.1* 1.6* 1.2  PROT 7.2 7.3 7.5 7.1 6.8  ALBUMIN 3.9 3.5 3.4* 3.1* 2.7*    Recent Labs Lab 01/31/16 2346 02/01/16 0550 02/03/16 0846  LIPASE 18 21 16    CBC:  Recent Labs Lab 01/31/16 2346 02/01/16 0550 02/02/16 0419 02/03/16 0846 02/05/16 0411 02/07/16 0404  WBC 5.5 6.5 11.7* 12.5* 9.5 7.1  NEUTROABS 3.1 5.2 9.2* 10.1*  --  3.8  HGB 13.9 13.3 13.1 12.8* 12.1* 11.4*  HCT 41.7 39.7 39.2 37.7* 36.6* 34.3*  MCV 92.5 92.1 92.5 92.6 92.2 91.5  PLT 260 251 232 235 270 328    BNP (last 3 results)  Recent Labs  04/27/15 1224  BNP 9.5   CBG:  Recent Labs Lab 02/06/16 1747 02/06/16 2038 02/07/16 0005 02/07/16 0358 02/07/16 0739  GLUCAP 121* 142* 108* 103* 110*   SIGNED: Time coordinating discharge: 30 minutes  MAGICK-Laycie Schriner, MD  [facility] 02/07/2016, 10:39 AM Pager [phone]  If 7PM-7AM, please contact night-coverage [email or web address] Password [email or web address]

## 2016-02-07 NOTE — Progress Notes (Signed)
3 Days Post-Op  Subjective: Some incisional pain when he coughs.    Objective: Vital signs in last 24 hours: Temp:  [98.1 F (36.7 C)-98.4 F (36.9 C)] 98.4 F (36.9 C) (08/02 0536) Pulse Rate:  [73-93] 73 (08/02 0536) Resp:  [18-20] 18 (08/02 0536) BP: (106-151)/(68-84) 106/68 (08/02 0536) SpO2:  [96 %-98 %] 96 % (08/02 0536) Weight:  [132.9 kg (293 lb)] 132.9 kg (293 lb) (08/02 0536) Last BM Date: 02/05/16  Intake/Output from previous day: 08/01 0701 - 08/02 0700 In: 480 [P.O.:480] Out: 640 [Urine:600; Drains:40] Intake/Output this shift: Total I/O In: 240 [P.O.:240] Out: -   PE: General- In NAD Abdomen-soft, incisions clean and intact, thin serosanguinous drain output  Lab Results:   Recent Labs  02/05/16 0411 02/07/16 0404  WBC 9.5 7.1  HGB 12.1* 11.4*  HCT 36.6* 34.3*  PLT 270 328   BMET  Recent Labs  02/05/16 0411 02/07/16 0404  NA 134* 136  K 3.7 3.6  CL 102 102  CO2 22 26  GLUCOSE 111* 110*  BUN 10 11  CREATININE 0.83 0.80  CALCIUM 8.2* 8.3*   PT/INR No results for input(s): LABPROT, INR in the last 72 hours. Comprehensive Metabolic Panel:    Component Value Date/Time   NA 136 02/07/2016 0404   NA 134 (L) 02/05/2016 0411   K 3.6 02/07/2016 0404   K 3.7 02/05/2016 0411   CL 102 02/07/2016 0404   CL 102 02/05/2016 0411   CO2 26 02/07/2016 0404   CO2 22 02/05/2016 0411   BUN 11 02/07/2016 0404   BUN 10 02/05/2016 0411   CREATININE 0.80 02/07/2016 0404   CREATININE 0.83 02/05/2016 0411   CREATININE 1.17 05/19/2015 1124   CREATININE 0.85 04/27/2015 1211   GLUCOSE 110 (H) 02/07/2016 0404   GLUCOSE 111 (H) 02/05/2016 0411   CALCIUM 8.3 (L) 02/07/2016 0404   CALCIUM 8.2 (L) 02/05/2016 0411   AST 44 (H) 02/07/2016 0404   AST 60 (H) 02/05/2016 0411   ALT 74 (H) 02/07/2016 0404   ALT 83 (H) 02/05/2016 0411   ALKPHOS 81 02/07/2016 0404   ALKPHOS 86 02/05/2016 0411   BILITOT 1.2 02/07/2016 0404   BILITOT 1.6 (H) 02/05/2016 0411   PROT  6.8 02/07/2016 0404   PROT 7.1 02/05/2016 0411   ALBUMIN 2.7 (L) 02/07/2016 0404   ALBUMIN 3.1 (L) 02/05/2016 0411     Studies/Results: No results found.  Anti-infectives: Anti-infectives    Start     Dose/Rate Route Frequency Ordered Stop   02/07/16 0000  ciprofloxacin (CIPRO) 500 MG tablet  Status:  Discontinued     500 mg Oral 2 times daily 02/07/16 1035 02/07/16    02/07/16 0000  ciprofloxacin (CIPRO) 500 MG tablet     500 mg Oral 2 times daily 02/07/16 1037     02/01/16 1400  piperacillin-tazobactam (ZOSYN) IVPB 3.375 g     3.375 g 12.5 mL/hr over 240 Minutes Intravenous Every 8 hours 02/01/16 0516     02/01/16 0530  piperacillin-tazobactam (ZOSYN) IVPB 3.375 g     3.375 g 100 mL/hr over 30 Minutes Intravenous  Once 02/01/16 0516 02/01/16 [id number]      Assessment Acute gangrenous cholecystitis s/p lap chole (Dr. Marcello Moores) 02/04/16-progressing well  Diabetes Mellitus- well controlled   LOS: 6 days   Plan: Can discharge on oral abxs for 7 more day, probiotic daily.  Will leave drain in and see him in the office next week for drain removal.   [name] [name]  J 02/07/2016

## 2016-02-09 ENCOUNTER — Other Ambulatory Visit: Payer: Self-pay | Admitting: *Deleted

## 2016-02-09 NOTE — Patient Outreach (Addendum)
[facility]) Care Management  02/09/2016  [name] [name] [birth year] [id number]   Subjective: Telephone call to patient's home number, spoke with patient, and HIPAA verified.  States he is very sore and managing the pain.    Discussed [facility] Care Management UMR Transition of care follow and care management services.   Patient voiced understanding and in agreement to complete telephone screen.  Patient answered a few questions, requested a call back at another time and disconnected the call.   Objective: Per chart review: Patient hospitalized 01/31/16 -02/07/16 for acute cholecystitis.  Status post LAPAROSCOPIC CHOLECYSTECTOMY on 02/04/16.     Patient also has a history of diabetes and hypertension.    Assessment: Received UMR Transition of care referral on 02/06/16.   Transition of care follow up pending, patient contact.   Plan: RNCM will call patient for 2nd telephonic outreach attempt, transition of care follow up, within 10 business days.   Codi Folkerts H. Annia Friendly, BSN, [place] Management [facility] Telephonic CM Phone: [phone] Fax: [phone]

## 2016-02-12 ENCOUNTER — Other Ambulatory Visit: Payer: Self-pay | Admitting: *Deleted

## 2016-02-12 ENCOUNTER — Ambulatory Visit: Payer: Self-pay | Admitting: *Deleted

## 2016-02-12 NOTE — Patient Outreach (Addendum)
[place] [facility]) Care Management  02/12/2016  CHRISTIA LAHM Nov 27, 1950 [id number]   Subjective: Telephone call to patient's home number, no answer, left HIPAA compliant voicemail message for patient, and requested call back.   Objective: Per chart review: Patient hospitalized 01/31/16 -02/07/16 for acute cholecystitis.  Status post LAPAROSCOPIC CHOLECYSTECTOMY on 02/04/16.     Patient also has a history of diabetes and hypertension.    Assessment: Received UMR Transition of care referral on 02/06/16.   Transition of care follow up pending, patient contact.   Plan: RNCM will call patient for 2nd telephonic outreach attempt, transition of care follow up, within 10 business days.   Dimitrious Micciche H. Annia Friendly, BSN, [place] Management [facility] Telephonic CM Phone: [phone] Fax: [phone]

## 2016-02-13 ENCOUNTER — Telehealth: Payer: Self-pay | Admitting: Internal Medicine

## 2016-02-13 ENCOUNTER — Ambulatory Visit: Payer: Self-pay | Admitting: *Deleted

## 2016-02-13 ENCOUNTER — Other Ambulatory Visit: Payer: Self-pay | Admitting: *Deleted

## 2016-02-13 NOTE — Telephone Encounter (Signed)
Spoke to patient.  Advised to call back to speak with scheduler for an appt. Patient agreed.

## 2016-02-13 NOTE — Telephone Encounter (Signed)
[name] [name] called back asking if he should take over the counter medicine or will something be prescribed for him to help him sleep. Please give him a call regarding this.   Thank you.

## 2016-02-13 NOTE — Telephone Encounter (Signed)
Mr. [name] [name] called saying he had surgery last week and now he's having difficulty getting to sleep at night and staying asleep. He said the same thing happened the time before when he had surgery and Dr. Renold Genta saw him for this and prescribed meds. He's wondering what can be done regarding this now. Please give him a phone call regarding this.  Pt's ph# [phone] Thank you.

## 2016-02-13 NOTE — Patient Outreach (Addendum)
[facility]) Care Management  02/13/2016  [name] [name] Apr 25, 1951 [id number]   Subjective: Telephone call to patient's home number, spoke with patient, and HIPAA verified.  States he is doing much better than he was a week ago.   Discussed [facility] Care Management UMR Transition of care follow and patient in agreement to receive services. Patient states he has not has a chance to review the Link to Wellness information and will let RNCM know on next contact if he would like to enroll.  States he went to surgeon's office on 02/12/16 for drain removal and everything went well.  States his next appointment with surgeon will be on 02/21/16.    States the only problem he is having is waking up shortly after going to sleep and not being able to go back to sleep for several hours.   States he has contacted his primary MD regarding sleep issue and is waiting for call back with recommendations.  States the sleep issue has come up in the past after surgery and MD prescribed a pill that helped him to sleep.  RNCM advised patient to advise MDs in the future of this sleep issue prior to surgery, so they will be aware of this possible side effect, and may proactively prescribe something postop.  Patient voices understanding. Patient in agreement to continue to receive [facility] Care Management UMR Transition of care follow up.  Patient in agreement to receive EMMI video information "Insomnia and Getting a Good Night's Sleep".  Patient states he prefers to receive educational materials via email ([email or web address]).  Patient states he has RNCM's contact information, [facility] Care Management information packet, and does not need a welcome letter.     Objective:Per chart review: Patient hospitalized 01/31/16 -02/07/16 for acute cholecystitis. Status post LAPAROSCOPIC CHOLECYSTECTOMY on 02/04/16. Patient also has a history of diabetes and hypertension.    Assessment:Received UMR Transition of care  referral on 02/06/16. Transition of care follow up completed.   Patient will continue to receive transition of care follow up.     Plan: RNCM will call patient for telephonic outreach,  transition of care follow up, within 10 business days. RNCM will follow up on outcome of 02/21/16 surgery follow up visit, sleep issue status, and date of primary MD appointment, within 10 business days.  RNCM will follow up to verify receipt and ask if patient has an questions regarding EMMI video " Insomnia and Getting a Good Night's Sleep"   within 3 business weeks.    Kayn Haymore H. Annia Friendly, BSN, [place] Management [facility] Telephonic CM Phone: [phone] Fax: [phone]

## 2016-02-14 ENCOUNTER — Encounter: Payer: Self-pay | Admitting: Internal Medicine

## 2016-02-14 ENCOUNTER — Ambulatory Visit (INDEPENDENT_AMBULATORY_CARE_PROVIDER_SITE_OTHER): Payer: 59 | Admitting: Internal Medicine

## 2016-02-14 VITALS — BP 150/80 | HR 90 | Temp 97.2°F | Resp 12 | Wt 282.0 lb

## 2016-02-14 DIAGNOSIS — G4700 Insomnia, unspecified: Secondary | ICD-10-CM

## 2016-02-14 MED ORDER — ZOLPIDEM TARTRATE 5 MG PO TABS: ORAL_TABLET | ORAL | 1 refills | Status: DC

## 2016-02-14 MED FILL — ZOLPIDEM TARTRATE 5 MG TAB: 5 | 15 days supply | Qty: 30 | Fill #0

## 2016-02-14 NOTE — Progress Notes (Signed)
Pt had recent admission

## 2016-02-22 ENCOUNTER — Other Ambulatory Visit: Payer: Self-pay | Admitting: *Deleted

## 2016-02-22 ENCOUNTER — Ambulatory Visit: Payer: Self-pay | Admitting: *Deleted

## 2016-02-22 NOTE — Patient Outreach (Addendum)
[facility]) Care Management  02/22/2016  [name] [name] [birth year] [id number]  Subjective: Telephone call to patient's home number, spoke with patient, and HIPAA verified.  States he is doing great, sleeping better, and has been released to return to work on 02/28/16.  States his follow up visit with surgeon went well.   Patient states he has lab work at primary MD's office on 02/26/16 and has his annual physical scheduled on 03/04/16.   States he will have to reschedule the 03/04/16 appointment  because he has to work.   Patient states he has not had a chance to review the Link to Wellness or EMMI information (EMMI video information "Insomnia and Getting a Good Night's Sleep" ) and requested a call back next to week to discuss.  States he took the sleep medicine for a few nights and currently weaning himself off the medication before he returns to work.   Patient continues to receive [facility] Transition of care follow up.  Objective:Per chart review: Patient hospitalized 01/31/16 -02/07/16 for acute cholecystitis. Status post LAPAROSCOPIC CHOLECYSTECTOMY on 02/04/16. Patient also has a history of diabetes and hypertension.    Assessment:Received UMR Transition of care referral on 02/06/16. Transition of care follow up completed.   Patient will continue to receive transition of care follow up.     Plan: RNCM will call patient for telephonic outreach,  transition of care follow up, within 10 business days. RNCM will follow up on outcome of 02/26/16 labs, within 10 business days.  RNCM will follow up to verify receipt and ask if patient has an questions regarding EMMI video " Insomnia and Getting a Good Night's Sleep"   within 2 business weeks. RNCM will follow up on patient decision regarding enrollment in Link to Wellness program within 2 business weeks.    [name] [name] H. Annia Friendly, BSN, [place] Telephonic CM Phone: [phone]

## 2016-02-26 ENCOUNTER — Other Ambulatory Visit: Payer: 59 | Admitting: Internal Medicine

## 2016-02-27 ENCOUNTER — Ambulatory Visit: Payer: Self-pay | Admitting: *Deleted

## 2016-02-27 ENCOUNTER — Other Ambulatory Visit: Payer: Self-pay | Admitting: *Deleted

## 2016-02-27 NOTE — Patient Outreach (Addendum)
[place] [facility]) Care Management  02/27/2016  TRYSTEN PRESUTTI [birth year] [id number]   Subjective: Telephone call to patient's home number, spoke with patient, and HIPAA verified.   Patient states things are going well, he is returning to work on 02/28/16, and on light duty.  States he has reviewed information from [facility] and has no questions.  States he does not want to enroll in [employer] at this time and will discuss with his primary MD.   Patient states his lab work was good and follow up with primary MD went well. Patient states he does not have any transition of care, care coordination, disease management, disease monitoring, transportation, community resource, or pharmacy needs at this time.   States he is appreciative of RNCM follow up.  Objective:Per chart review: Patient hospitalized 01/31/16 -02/07/16 for acute cholecystitis. Status post LAPAROSCOPIC CHOLECYSTECTOMY on 02/04/16. Patient also has a history of diabetes and hypertension.    Assessment:Received UMR Transition of care referral on 02/06/16. Transition of care follow up completed. Patient has no care management needs or telephonic RNCM needs at this time.   Plan: RNCM will send case closure due to follow up completed / no care management needs request to [facility] [place] [facility].     Young Mulvey H. Annia Friendly, BSN, [place] Telephonic CM Phone: [phone]

## 2016-02-29 ENCOUNTER — Other Ambulatory Visit: Payer: 59 | Admitting: Internal Medicine

## 2016-03-01 ENCOUNTER — Encounter: Payer: 59 | Admitting: Internal Medicine

## 2016-03-03 NOTE — Progress Notes (Signed)
   Subjective:    Patient ID: EARLEE AGE, male    DOB: [birth year], 65 y.o.   MRN: [id number]  HPI 65 year old 24 male with history of hypertension, hyperlipidemia, and impaired glucose tolerance presented to [facility] July 26 with abdominal pain. He was found to have acute cholecystitis. He underwent ERCP by Dr. Carlean Purl because of biliary dilatation noted on ultrasound. A biliary sphincterotomy was performed. Biliary tree appeared to be normal without tumor. He then underwent laparoscopic cholecystectomy on July 30 by Dr. Leighton Ruff. He had acute gangrenous cholecystitis. He did well postoperatively and was discharged home on August 2. Since discharge home, he's had considerable issues with insomnia. He had problems with insomnia several years ago after having    Review of Systems     Objective:   Physical Exam Spent 20 minutes speaking with him about insomnia. He feels well and his appetite is good. This is likely a short-term phenomenon related to stress with recent hospitalization. He had similar issues upon discharge home after being hospitalized  by Dr. Vertell Limber in 2007. He had anterior cervical corpectomy of C4-C5. He had developed cervical stenosis with myelopathy. hospitalized.  I went back and checked his records and he was given some Ambien at that time for sleep. This worked well for him.   Also he had Pneumovax 23 injection 10/26/2009       Assessment & Plan:  Insomnia  Status post laparoscopic cholecystitis for gangrenous cholecystitis. Reassured patient ERCP showed no evidence of tumor.  Plan: Ambien 5 mg daily at bedtime. He is to call me if not getting relief from insomnia within the next 48 hours.   Next colonoscopy is due by Dr. Carlean Purl in January 2020. He had colonoscopy December 2009. Remote history of esophageal ring in 2008.

## 2016-03-03 NOTE — Patient Instructions (Signed)
Take Ambien 5 mg at bedtime. Call if not getting relief in the next 48 hours.

## 2016-03-04 ENCOUNTER — Encounter: Payer: Self-pay | Admitting: Internal Medicine

## 2016-03-07 ENCOUNTER — Encounter: Payer: Self-pay | Admitting: Internal Medicine

## 2016-03-25 ENCOUNTER — Other Ambulatory Visit: Payer: Self-pay | Admitting: *Deleted

## 2016-03-25 MED ORDER — SILDENAFIL CITRATE 100 MG PO TABS: 100.0000 mg | ORAL_TABLET | Freq: Every day | ORAL | 11 refills | Status: DC | PRN

## 2016-03-25 MED FILL — VIAGRA 100 MG TABLET: 100 | 90 days supply | Qty: 18 | Fill #0

## 2016-04-03 ENCOUNTER — Other Ambulatory Visit: Payer: Self-pay | Admitting: *Deleted

## 2016-04-03 MED ORDER — ESOMEPRAZOLE MAGNESIUM 40 MG PO CPDR: 40.0000 mg | DELAYED_RELEASE_CAPSULE | Freq: Every day | ORAL | 4 refills | Status: DC

## 2016-04-03 MED ORDER — METFORMIN HCL ER 500 MG PO TB24: 500.0000 mg | ORAL_TABLET | Freq: Every day | ORAL | 4 refills | Status: DC

## 2016-04-03 MED FILL — AMLODIPINE BESYLATE 10 MG T: 10 | 90 days supply | Qty: 90 | Fill #3

## 2016-04-03 MED FILL — LISINOPRIL 10 MG TABLET: 10 | 90 days supply | Qty: 90 | Fill #3

## 2016-04-03 MED FILL — ATORVASTATIN 10 MG TABLET: 10 | 90 days supply | Qty: 90 | Fill #3

## 2016-04-03 MED FILL — METFORMIN HCL ER 500 MG TAB: 500 | 90 days supply | Qty: 90 | Fill #0

## 2016-04-03 MED FILL — ESOMEPRAZOLE MAG DR 40 MG C: 40 | 90 days supply | Qty: 90 | Fill #0

## 2016-04-03 MED FILL — FUROSEMIDE 20 MG TABLET: 20 | 90 days supply | Qty: 90 | Fill #3

## 2016-04-04 ENCOUNTER — Other Ambulatory Visit: Payer: Self-pay | Admitting: Internal Medicine

## 2016-04-04 MED FILL — MELOXICAM 15 MG TABLET: 15 | 90 days supply | Qty: 90 | Fill #0

## 2016-04-19 ENCOUNTER — Other Ambulatory Visit: Payer: 59 | Admitting: Internal Medicine

## 2016-04-19 DIAGNOSIS — N529 Male erectile dysfunction, unspecified: Secondary | ICD-10-CM | POA: Diagnosis not present

## 2016-04-19 DIAGNOSIS — Z0000 Encounter for general adult medical examination without abnormal findings: Secondary | ICD-10-CM | POA: Diagnosis not present

## 2016-04-19 DIAGNOSIS — E785 Hyperlipidemia, unspecified: Secondary | ICD-10-CM

## 2016-04-19 DIAGNOSIS — E118 Type 2 diabetes mellitus with unspecified complications: Secondary | ICD-10-CM | POA: Diagnosis not present

## 2016-04-19 LAB — COMPREHENSIVE METABOLIC PANEL
ALBUMIN: 4 g/dL (ref 3.6–5.1)
ALT: 11 U/L (ref 9–46)
AST: 16 U/L (ref 10–35)
Alkaline Phosphatase: 90 U/L (ref 40–115)
BILIRUBIN TOTAL: 1.1 mg/dL (ref 0.2–1.2)
BUN: 12 mg/dL (ref 7–25)
CALCIUM: 9.2 mg/dL (ref 8.6–10.3)
CO2: 22 mmol/L (ref 20–31)
Chloride: 105 mmol/L (ref 98–110)
Creat: 0.89 mg/dL (ref 0.70–1.25)
Glucose, Bld: 106 mg/dL — ABNORMAL HIGH (ref 65–99)
POTASSIUM: 4.3 mmol/L (ref 3.5–5.3)
Sodium: 141 mmol/L (ref 135–146)
Total Protein: 7.2 g/dL (ref 6.1–8.1)

## 2016-04-19 LAB — CBC WITH DIFFERENTIAL/PLATELET
BASOS ABS: 0 {cells}/uL (ref 0–200)
Basophils Relative: 0 %
EOS PCT: 2 %
Eosinophils Absolute: 104 cells/uL (ref 15–500)
HCT: 42.8 % (ref 38.5–50.0)
HEMOGLOBIN: 13.8 g/dL (ref 13.2–17.1)
LYMPHS ABS: 2496 {cells}/uL (ref 850–3900)
LYMPHS PCT: 48 %
MCH: 29.9 pg (ref 27.0–33.0)
MCHC: 32.2 g/dL (ref 32.0–36.0)
MCV: 92.8 fL (ref 80.0–100.0)
MPV: 9.3 fL (ref 7.5–12.5)
Monocytes Absolute: 572 cells/uL (ref 200–950)
Monocytes Relative: 11 %
NEUTROS PCT: 39 %
Neutro Abs: 2028 cells/uL (ref 1500–7800)
Platelets: 276 10*3/uL (ref 140–400)
RBC: 4.61 MIL/uL (ref 4.20–5.80)
RDW: 13.7 % (ref 11.0–15.0)
WBC: 5.2 10*3/uL (ref 3.8–10.8)

## 2016-04-19 LAB — HEMOGLOBIN A1C
HEMOGLOBIN A1C: 6 % — AB (ref <5.7)
Mean Plasma Glucose: 126 mg/dL

## 2016-04-19 LAB — LIPID PANEL
CHOL/HDL RATIO: 2.5 ratio (ref <=5.0)
Cholesterol: 135 mg/dL (ref 125–200)
HDL: 54 mg/dL (ref >=40)
LDL CALC: 69 mg/dL (ref <130)
TRIGLYCERIDES: 58 mg/dL (ref <150)
VLDL: 12 mg/dL (ref <30)

## 2016-04-19 LAB — PSA: PSA: 0.6 ng/mL (ref <=4.0)

## 2016-04-23 ENCOUNTER — Ambulatory Visit (INDEPENDENT_AMBULATORY_CARE_PROVIDER_SITE_OTHER): Payer: 59 | Admitting: Internal Medicine

## 2016-04-23 ENCOUNTER — Encounter: Payer: Self-pay | Admitting: Internal Medicine

## 2016-04-23 VITALS — BP 136/84 | HR 71 | Temp 97.2°F | Ht 70.0 in | Wt 275.5 lb

## 2016-04-23 DIAGNOSIS — E119 Type 2 diabetes mellitus without complications: Secondary | ICD-10-CM | POA: Diagnosis not present

## 2016-04-23 DIAGNOSIS — K219 Gastro-esophageal reflux disease without esophagitis: Secondary | ICD-10-CM | POA: Diagnosis not present

## 2016-04-23 DIAGNOSIS — E66811 Obesity, class 1: Secondary | ICD-10-CM

## 2016-04-23 DIAGNOSIS — N529 Male erectile dysfunction, unspecified: Secondary | ICD-10-CM | POA: Diagnosis not present

## 2016-04-23 DIAGNOSIS — E349 Endocrine disorder, unspecified: Secondary | ICD-10-CM | POA: Diagnosis not present

## 2016-04-23 DIAGNOSIS — R609 Edema, unspecified: Secondary | ICD-10-CM | POA: Diagnosis not present

## 2016-04-23 DIAGNOSIS — E7800 Pure hypercholesterolemia, unspecified: Secondary | ICD-10-CM | POA: Diagnosis not present

## 2016-04-23 DIAGNOSIS — Z8739 Personal history of other diseases of the musculoskeletal system and connective tissue: Secondary | ICD-10-CM

## 2016-04-23 DIAGNOSIS — E8881 Metabolic syndrome: Secondary | ICD-10-CM | POA: Diagnosis not present

## 2016-04-23 DIAGNOSIS — M170 Bilateral primary osteoarthritis of knee: Secondary | ICD-10-CM | POA: Diagnosis not present

## 2016-04-23 DIAGNOSIS — Z0000 Encounter for general adult medical examination without abnormal findings: Secondary | ICD-10-CM

## 2016-04-23 DIAGNOSIS — E6609 Other obesity due to excess calories: Secondary | ICD-10-CM

## 2016-04-23 DIAGNOSIS — E118 Type 2 diabetes mellitus with unspecified complications: Secondary | ICD-10-CM | POA: Diagnosis not present

## 2016-04-23 DIAGNOSIS — I10 Essential (primary) hypertension: Secondary | ICD-10-CM | POA: Diagnosis not present

## 2016-04-23 LAB — POCT URINALYSIS DIPSTICK
Bilirubin, UA: NEGATIVE
GLUCOSE UA: NEGATIVE
Ketones, UA: NEGATIVE
Leukocytes, UA: NEGATIVE
NITRITE UA: NEGATIVE
PH UA: 7
Protein, UA: NEGATIVE
RBC UA: NEGATIVE
SPEC GRAV UA: 1.01
Urobilinogen, UA: 2

## 2016-04-23 NOTE — Patient Instructions (Signed)
It was a pleasure to see you today. Continue same medications and return in 6 months. Recommend diet exercise and weight loss.

## 2016-04-23 NOTE — Progress Notes (Signed)
   Subjective:    Patient ID: [name] [name], male    DOB: 08/27/1950, 65 y.o.   MRN: [id number]  HPI   65 year old Black Male for health maintenance exam and evaluation of medical issues. Hx DM, HTN, obesity, gout, GERD, erectile dysfunction. Hx primary osteoarthritis both knees. Sees Dr. Latanya Maudlin for knee issues.  No known drug allergies.  In October 26, 2006, he weighed 300 pounds. He had elevated liver functions at the time thought to be due to fatty liver. These improved after losing 18 pounds in 10-26-06.  History of gout involving right toe and foot.  History of low testosterone level in 10/25/2009 for Androgel was prescribed.  Schatzki's ring dilated by Dr. Carlean Purl in 10/26/2006. Hospitalized with acute cholecystitis July 2017. He had ERCP. He had laparoscopic cholecystectomy.   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 He has a scheduled were he works 5 days during the week and then is off 8 days. He was able to keep benefits his weight. He works no nights,  Weekends, or holidays.  Wife is retired from the school system. They are active in their church. He does not smoke. Does not consume alcohol. One son with history of kidney transplant due to severe hypertension and chronic kidney disease. 3 adult children, 2 sons and a daughter.  Family history: Mother died at age 63 in 2001-10-25 of a CVA      Review of Systems has some bilateral knee pain.     Objective:   Physical Exam  Constitutional: He is oriented to person, place, and time. He appears well-developed and well-nourished. No distress.  HENT:  Head: Normocephalic and atraumatic.  Right Ear: External ear normal.  Left Ear: External ear normal.  Mouth/Throat: Oropharynx is clear and moist.  Eyes: Conjunctivae and EOM are normal. Pupils are equal, round, and reactive to light. Right eye exhibits no discharge. Left eye exhibits no discharge. No scleral icterus.    Neck: Neck supple. No JVD present. No thyromegaly present.  Cardiovascular: Normal rate, regular rhythm, normal heart sounds and intact distal pulses.   No murmur heard. Pulmonary/Chest: Effort normal and breath sounds normal. He has no wheezes. He has no rales.  Abdominal: Soft. Bowel sounds are normal. He exhibits no distension. There is no tenderness. There is no rebound and no guarding.  Genitourinary: Prostate normal.  Musculoskeletal: He exhibits no edema.  Lymphadenopathy:    He has no cervical adenopathy.  Neurological: He is alert and oriented to person, place, and time. He has normal reflexes. No cranial nerve deficit. Coordination normal.  Skin: Skin is warm and dry. No rash noted. He is not diaphoretic.  Psychiatric: He has a normal mood and affect. His behavior is normal. Judgment and thought content normal.  Vitals reviewed.         Assessment & Plan:  Essential hypertension-stable on current medication  Controlled type 2 diabetes mellitus. Hemoglobin A1c excellent at 6%  Bilateral osteoarthritis both knees  Testosterone deficiency  Obesity  Hyperlipidemia treated with statin therapy  Erectile dysfunction  GE reflux  Status post cholecystectomy  Plan: Continue to encourage diet exercise and weight loss. Return in 6 months for office visit, lipid panel liver functions hemoglobin A1c and basic metabolic panel. Continue same medications. Flu shot received through employment. Other immunizations are up-to-date.  Lab work reviewed and is entirely within normal limits.

## 2016-04-24 LAB — MICROALBUMIN, URINE: MICROALB UR: 1.4 mg/dL

## 2016-05-15 DIAGNOSIS — M1712 Unilateral primary osteoarthritis, left knee: Secondary | ICD-10-CM | POA: Diagnosis not present

## 2016-05-15 DIAGNOSIS — M1711 Unilateral primary osteoarthritis, right knee: Secondary | ICD-10-CM | POA: Diagnosis not present

## 2016-05-29 DIAGNOSIS — M1711 Unilateral primary osteoarthritis, right knee: Secondary | ICD-10-CM | POA: Diagnosis not present

## 2016-05-29 DIAGNOSIS — M1712 Unilateral primary osteoarthritis, left knee: Secondary | ICD-10-CM | POA: Diagnosis not present

## 2016-06-05 DIAGNOSIS — M1712 Unilateral primary osteoarthritis, left knee: Secondary | ICD-10-CM | POA: Diagnosis not present

## 2016-06-05 DIAGNOSIS — M1711 Unilateral primary osteoarthritis, right knee: Secondary | ICD-10-CM | POA: Diagnosis not present

## 2016-06-12 DIAGNOSIS — M1712 Unilateral primary osteoarthritis, left knee: Secondary | ICD-10-CM | POA: Diagnosis not present

## 2016-06-12 DIAGNOSIS — M1711 Unilateral primary osteoarthritis, right knee: Secondary | ICD-10-CM | POA: Diagnosis not present

## 2016-06-19 DIAGNOSIS — M1712 Unilateral primary osteoarthritis, left knee: Secondary | ICD-10-CM | POA: Diagnosis not present

## 2016-06-19 DIAGNOSIS — M1711 Unilateral primary osteoarthritis, right knee: Secondary | ICD-10-CM | POA: Diagnosis not present

## 2016-06-20 MED FILL — VIAGRA 100 MG TABLET: 100 | 90 days supply | Qty: 18 | Fill #1

## 2016-06-26 DIAGNOSIS — M1711 Unilateral primary osteoarthritis, right knee: Secondary | ICD-10-CM | POA: Diagnosis not present

## 2016-06-26 DIAGNOSIS — M1712 Unilateral primary osteoarthritis, left knee: Secondary | ICD-10-CM | POA: Diagnosis not present

## 2016-07-04 MED FILL — LISINOPRIL 10 MG TABLET: 10 | 90 days supply | Qty: 90 | Fill #4

## 2016-07-04 MED FILL — FUROSEMIDE 20 MG TABLET: 20 | 90 days supply | Qty: 90 | Fill #4

## 2016-07-04 MED FILL — ZOLPIDEM TARTRATE 5 MG TAB: 5 | 15 days supply | Qty: 30 | Fill #1

## 2016-07-04 MED FILL — ESOMEPRAZOLE MAG DR 40 MG C: 40 | 90 days supply | Qty: 90 | Fill #1

## 2016-07-04 MED FILL — METFORMIN HCL ER 500 MG TAB: 500 | 90 days supply | Qty: 90 | Fill #1

## 2016-07-05 ENCOUNTER — Other Ambulatory Visit: Payer: Self-pay | Admitting: Internal Medicine

## 2016-07-05 MED ORDER — AMLODIPINE BESYLATE 10 MG PO TABS: 10.0000 mg | ORAL_TABLET | Freq: Every day | ORAL | 3 refills | Status: DC

## 2016-07-05 MED ORDER — ATORVASTATIN CALCIUM 10 MG PO TABS: 10.0000 mg | ORAL_TABLET | Freq: Every day | ORAL | 3 refills | Status: DC

## 2016-07-05 MED FILL — ATORVASTATIN 10 MG TABLET: 10 | 90 days supply | Qty: 90 | Fill #0

## 2016-07-05 MED FILL — AMLODIPINE BESYLATE 10 MG T: 10 | 90 days supply | Qty: 90 | Fill #0

## 2016-08-06 ENCOUNTER — Other Ambulatory Visit: Payer: Self-pay | Admitting: Internal Medicine

## 2016-08-06 MED FILL — MELOXICAM 15 MG TABLET: 15 | 90 days supply | Qty: 90 | Fill #0

## 2016-10-02 MED FILL — ATORVASTATIN 10 MG TABLET: 10 | 90 days supply | Qty: 90 | Fill #1

## 2016-10-02 MED FILL — ESOMEPRAZOLE MAG DR 40 MG C: 40 | 90 days supply | Qty: 90 | Fill #2

## 2016-10-02 MED FILL — AMLODIPINE BESYLATE 10 MG T: 10 | 90 days supply | Qty: 90 | Fill #1

## 2016-10-02 MED FILL — METFORMIN HCL ER 500 MG TAB: 500 | 90 days supply | Qty: 90 | Fill #2

## 2016-10-03 ENCOUNTER — Other Ambulatory Visit: Payer: Self-pay

## 2016-10-03 MED ORDER — LISINOPRIL 10 MG PO TABS: 10.0000 mg | ORAL_TABLET | Freq: Every day | ORAL | 99 refills | Status: DC

## 2016-10-03 MED ORDER — FUROSEMIDE 40 MG PO TABS: 20.0000 mg | ORAL_TABLET | Freq: Every day | ORAL | 1 refills | Status: DC

## 2016-10-03 MED FILL — FUROSEMIDE 40 MG TABLET: 40 | 90 days supply | Qty: 45 | Fill #0

## 2016-10-03 MED FILL — LISINOPRIL 10 MG TABLET: 10 | 90 days supply | Qty: 90 | Fill #0

## 2016-10-07 ENCOUNTER — Other Ambulatory Visit: Payer: Self-pay | Admitting: Internal Medicine

## 2016-10-07 NOTE — Telephone Encounter (Signed)
Left voice mail per Dr. Renold Genta on [facility] voice mail @ [phone] per Dr. Renold Genta patient DOES NOT want to refill this medication.  Patient initially wanted this medication for a BRIEF period of time S/P surgery because he couldn't sleep/rest S/P surgery.  However, he NO LONGER wants this medication.  We faxed on 1st request to the pharmacy and stated that patient didn't want to refill.  Today, I called the pharmacy refill line at [phone] and advised that patient does NOT want to refill the Ambien 5 mg at this time.  If they have questions, they are welcome to contact our office for further details.

## 2016-10-18 ENCOUNTER — Other Ambulatory Visit: Payer: 59 | Admitting: Internal Medicine

## 2016-10-18 DIAGNOSIS — E119 Type 2 diabetes mellitus without complications: Secondary | ICD-10-CM

## 2016-10-18 DIAGNOSIS — Z79899 Other long term (current) drug therapy: Secondary | ICD-10-CM

## 2016-10-18 DIAGNOSIS — E785 Hyperlipidemia, unspecified: Secondary | ICD-10-CM

## 2016-10-18 DIAGNOSIS — I10 Essential (primary) hypertension: Secondary | ICD-10-CM

## 2016-10-18 LAB — BASIC METABOLIC PANEL
BUN: 13 mg/dL (ref 7–25)
CHLORIDE: 104 mmol/L (ref 98–110)
CO2: 21 mmol/L (ref 20–31)
Calcium: 9.3 mg/dL (ref 8.6–10.3)
Creat: 0.89 mg/dL (ref 0.70–1.25)
Glucose, Bld: 103 mg/dL — ABNORMAL HIGH (ref 65–99)
POTASSIUM: 4.7 mmol/L (ref 3.5–5.3)
Sodium: 138 mmol/L (ref 135–146)

## 2016-10-18 LAB — LIPID PANEL
CHOL/HDL RATIO: 2.6 ratio (ref <5.0)
Cholesterol: 149 mg/dL (ref <200)
HDL: 58 mg/dL (ref >40)
LDL Cholesterol: 77 mg/dL (ref <100)
Triglycerides: 70 mg/dL (ref <150)
VLDL: 14 mg/dL (ref <30)

## 2016-10-18 LAB — HEPATIC FUNCTION PANEL
ALT: 12 U/L (ref 9–46)
AST: 17 U/L (ref 10–35)
Albumin: 3.9 g/dL (ref 3.6–5.1)
Alkaline Phosphatase: 90 U/L (ref 40–115)
BILIRUBIN DIRECT: 0.2 mg/dL (ref <=0.2)
BILIRUBIN TOTAL: 0.9 mg/dL (ref 0.2–1.2)
Indirect Bilirubin: 0.7 mg/dL (ref 0.2–1.2)
Total Protein: 7 g/dL (ref 6.1–8.1)

## 2016-10-19 LAB — MICROALBUMIN / CREATININE URINE RATIO
Creatinine, Urine: 135 mg/dL (ref 20–370)
MICROALB UR: 0.6 mg/dL
Microalb Creat Ratio: 4 mcg/mg creat (ref <30)

## 2016-10-19 LAB — HEMOGLOBIN A1C
HEMOGLOBIN A1C: 6.2 % — AB (ref <5.7)
MEAN PLASMA GLUCOSE: 131 mg/dL

## 2016-10-22 ENCOUNTER — Ambulatory Visit (INDEPENDENT_AMBULATORY_CARE_PROVIDER_SITE_OTHER): Payer: 59 | Admitting: Internal Medicine

## 2016-10-22 ENCOUNTER — Encounter: Payer: Self-pay | Admitting: Internal Medicine

## 2016-10-22 VITALS — BP 150/90 | HR 76 | Temp 97.1°F | Ht 70.0 in | Wt 296.0 lb

## 2016-10-22 DIAGNOSIS — E119 Type 2 diabetes mellitus without complications: Secondary | ICD-10-CM | POA: Diagnosis not present

## 2016-10-22 DIAGNOSIS — M170 Bilateral primary osteoarthritis of knee: Secondary | ICD-10-CM

## 2016-10-22 DIAGNOSIS — E784 Other hyperlipidemia: Secondary | ICD-10-CM

## 2016-10-22 DIAGNOSIS — I10 Essential (primary) hypertension: Secondary | ICD-10-CM

## 2016-10-22 DIAGNOSIS — E8881 Metabolic syndrome: Secondary | ICD-10-CM | POA: Diagnosis not present

## 2016-10-22 DIAGNOSIS — E7849 Other hyperlipidemia: Secondary | ICD-10-CM

## 2016-10-22 NOTE — Patient Instructions (Signed)
Take antihypertensive medications in the morning 2 hours before coming to office. Return in 2 weeks. Need to lose weight. Encouraged diet exercise and weight loss. Have diabetic eye exam.

## 2016-10-22 NOTE — Progress Notes (Signed)
   Subjective:    Patient ID: [name] [name], male    DOB: [birth year], 66 y.o.   MRN: [id number]  HPI 66 year old 76 male with history of essential hypertension, hyperlipidemia, controlled type 2 diabetes mellitus, metabolic syndrome, obesity in today for six-month recheck. He continues to work part-time at [employer]. Did not get off until 9:00 last night because of heavy caseload duty.  Weight today is 296 pounds indicating he has probably gained 21 pounds in last 6 months.  Review of Systems     Objective:   Physical Exam  Skin warm and dry. Nodes none. Chest clear. Cardiac exam regular rate and rhythm normal S1 and S2. Extremities without edema. Deferred diabetic foot exam until next visit.  He says he is to get eye exam in the next few weeks.  Blood pressure is elevated at 150 overnight any. Rechecked 160 overnight knee. Previously was 136/80 04/10/2016. Weight in October was 275 lbs. 8 oz.    Assessment & Plan:  Elevated blood pressure  Essential hypertension  Controlled type 2 diabetes mellitus  Hyperlipidemia  Obesity-weight gain of 21 pounds in past 6 months  Plan: Lab work reviewed and is stable. Hemoglobin A1c 6.2%. Lipid panel normal. Kidney functions are normal. Potassium is normal.  He only had metformin this morning with lisinopril. Generally takes amlodipine after lunch. Has not had Lasix.  He will take all of his antihypertensive medications 2 hours prior to coming to office and will return in 2 weeks.

## 2016-10-24 MED FILL — SILDENAFIL 100 MG TABLET: 100 | 90 days supply | Qty: 18 | Fill #2

## 2016-11-05 ENCOUNTER — Ambulatory Visit (INDEPENDENT_AMBULATORY_CARE_PROVIDER_SITE_OTHER): Payer: 59 | Admitting: Internal Medicine

## 2016-11-05 ENCOUNTER — Encounter: Payer: Self-pay | Admitting: Internal Medicine

## 2016-11-05 VITALS — BP 140/80 | HR 65 | Temp 98.0°F | Ht 70.0 in | Wt 296.0 lb

## 2016-11-05 DIAGNOSIS — I10 Essential (primary) hypertension: Secondary | ICD-10-CM | POA: Diagnosis not present

## 2016-11-05 MED ORDER — LISINOPRIL 20 MG PO TABS: 20.0000 mg | ORAL_TABLET | Freq: Every day | ORAL | 3 refills | Status: DC

## 2016-11-05 MED FILL — LISINOPRIL 20 MG TABLET: 20 | 90 days supply | Qty: 90 | Fill #0

## 2016-11-05 NOTE — Patient Instructions (Signed)
Continue to take all antihypertensive medicines 2 hours before coming to office. Continue Lasix one half of a 40 mg tablet daily i.e. 20 mg daily. Increase Prinivil from 10-20 mg daily. Continue Norvasc 10 mg daily. Return in 2 weeks.

## 2016-11-05 NOTE — Progress Notes (Signed)
   Subjective:    Patient ID: [name] [name], male    DOB: 1950-08-23, 66 y.o.   MRN: [id number]  HPI Is here today for blood pressure check. He currently is taking Lasix 20 mg daily, Prinivil 10 mg daily, Norvasc 10 mg daily. He doesn't have dependent edema. At some point his Lasix got reduced from 40 to 20 mg daily.   Because of his work schedule he would prefer not to have Lasix increased at this point in time. Therefore we can increase Prinivil to 20 mg daily and he will return in 2 weeks. At that time he'll need a basic metabolic panel with increased dose of Prinivil.    Review of Systems     Objective:   Physical Exam  skin warm and dry. Nodes none. Chest clear. Cardiac exam regular rate and rhythm. Extremities without edema.       Assessment & Plan:  Essential hypertension  Plan: Continue Lasix one half of a 40 mg tablet daily. Increase Prinivil from 10-20 mg daily. Continue Norvasc 10 mg daily. Continue to take all antihypertensive medications 2 hours before coming to office.

## 2016-11-27 ENCOUNTER — Other Ambulatory Visit: Payer: Self-pay | Admitting: Internal Medicine

## 2016-11-28 MED FILL — MELOXICAM 15 MG TABLET: 15 | 90 days supply | Qty: 90 | Fill #0

## 2016-11-29 ENCOUNTER — Ambulatory Visit (INDEPENDENT_AMBULATORY_CARE_PROVIDER_SITE_OTHER): Payer: 59 | Admitting: Internal Medicine

## 2016-11-29 ENCOUNTER — Encounter: Payer: Self-pay | Admitting: Internal Medicine

## 2016-11-29 VITALS — BP 142/84 | HR 72 | Temp 97.5°F | Ht 70.0 in | Wt 287.0 lb

## 2016-11-29 DIAGNOSIS — I10 Essential (primary) hypertension: Secondary | ICD-10-CM

## 2016-11-29 NOTE — Progress Notes (Signed)
   Subjective:    Patient ID: [name] [name], male    DOB: 09-07-1950, 66 y.o.   MRN: [id number]  HPI   Here today for follow up on HTN. Lost 9 pounds since last visit. Cut out sodas entirely.Still only taking one half of 40 mg Lasix tablet.  At last visit, Prinivil was increased from 10 to 20 mg daily and patient is continuing with Norvasc 10 mg daily. Despite this his blood pressure is still elevated at 142/84.   Review of Systems no new complaints     Objective:   Physical Exam  Spent 10 minutes making with patient about these issues.      Assessment & Plan:  Essential hypertension-would like to see control better  Plan: Increase Lasix to 40 mg daily from 20 mg daily. Continue Prinivil 20 mg daily. B- met was checked today because his Prinivil had been increased at last visit. Continue with Norvasc 10 mg daily. Consider changing Prinivil to Benicar. Return in 2 weeks  Addendum: B-met is within normal limits

## 2016-11-29 NOTE — Patient Instructions (Addendum)
Increase Lasix to 40 mg daily and RTC in 2 weeks. Continue Prinivil 20 mg daily and Norvasc 10 mg daily.

## 2016-11-30 LAB — BASIC METABOLIC PANEL
BUN: 14 mg/dL (ref 7–25)
CALCIUM: 9.3 mg/dL (ref 8.6–10.3)
CO2: 23 mmol/L (ref 20–31)
CREATININE: 0.92 mg/dL (ref 0.70–1.25)
Chloride: 102 mmol/L (ref 98–110)
Glucose, Bld: 99 mg/dL (ref 65–99)
Potassium: 4.6 mmol/L (ref 3.5–5.3)
SODIUM: 137 mmol/L (ref 135–146)

## 2016-12-13 ENCOUNTER — Encounter: Payer: Self-pay | Admitting: Internal Medicine

## 2016-12-13 ENCOUNTER — Ambulatory Visit (INDEPENDENT_AMBULATORY_CARE_PROVIDER_SITE_OTHER): Payer: 59 | Admitting: Internal Medicine

## 2016-12-13 VITALS — BP 138/78 | HR 84 | Temp 97.6°F | Wt 291.0 lb

## 2016-12-13 DIAGNOSIS — I10 Essential (primary) hypertension: Secondary | ICD-10-CM

## 2016-12-13 DIAGNOSIS — E118 Type 2 diabetes mellitus with unspecified complications: Secondary | ICD-10-CM

## 2016-12-13 NOTE — Patient Instructions (Signed)
Continue same medications and return in October for physical examination. Continue to monitor blood pressure at work

## 2016-12-13 NOTE — Progress Notes (Signed)
   Subjective:    Patient ID: [name] [name], male    DOB: 1951/04/12, 66 y.o.   MRN: [id number]  HPI for follow-up on hypertension. He is now taking Lasix daily. It does calls frequent urination which he doesn't like very much. Have her blood pressure is very good today at 138/78. Asking Gorka next her day in the operating room but he declined which I think is wise. At last visit blood pressure was 142/82 before he started taking Lasix regularly. He has a lot of Prinivil left. I have given some consideration to starting him on Benicar but were going to hold off on that for now. He is due for physical exam in October. We'll continue same medication.    Review of Systems see above     Objective:   Physical Exam  Not examined. Spent 10 minutes with him today discussing blood pressure and lifestyle      Assessment & Plan:  Essential hypertension  Plan: Return in October for physical examination. He will continue to monitor his blood pressure at work and call if it is elevated

## 2017-01-02 ENCOUNTER — Telehealth: Payer: Self-pay

## 2017-01-02 MED ORDER — FUROSEMIDE 40 MG PO TABS: ORAL_TABLET | ORAL | 3 refills | Status: DC

## 2017-01-02 MED FILL — AMLODIPINE BESYLATE 10 MG T: 10 | 90 days supply | Qty: 90 | Fill #2

## 2017-01-02 MED FILL — FUROSEMIDE 40 MG TABLET: 40 | 90 days supply | Qty: 90 | Fill #0

## 2017-01-02 MED FILL — ATORVASTATIN 10 MG TABLET: 10 | 90 days supply | Qty: 90 | Fill #2

## 2017-01-02 MED FILL — LISINOPRIL 10 MG TABLET: 10 | 90 days supply | Qty: 90 | Fill #1

## 2017-01-02 MED FILL — METFORMIN HCL ER 500 MG TAB: 500 | 90 days supply | Qty: 90 | Fill #3

## 2017-01-02 MED FILL — ESOMEPRAZOLE MAG DR 40 MG C: 40 | 90 days supply | Qty: 90 | Fill #3

## 2017-01-02 NOTE — Telephone Encounter (Signed)
Received fax from [facility] in regards to a refill on Furosemide 40mg   for patient. Medication was refilled per Dr. Verlene Mayer request. Sent 90 with 3 refills and edited signature since pt takes 1 tablet daily now instead of the half tablet like before.

## 2017-03-03 MED FILL — MELOXICAM 15 MG TABLET: 15 | 90 days supply | Qty: 90 | Fill #1

## 2017-03-18 MED FILL — SILDENAFIL CITRATE 100 MG T: 100 | 90 days supply | Qty: 18 | Fill #3

## 2017-04-02 MED FILL — METFORMIN HCL ER 500 MG TAB: 500 | 90 days supply | Qty: 90 | Fill #4

## 2017-04-02 MED FILL — FUROSEMIDE 40 MG TABLET: 40 | 90 days supply | Qty: 90 | Fill #1

## 2017-04-02 MED FILL — AMLODIPINE BESYLATE 10 MG T: 10 | 90 days supply | Qty: 90 | Fill #3

## 2017-04-02 MED FILL — LISINOPRIL 10 MG TABS: 10 | 90 days supply | Qty: 90 | Fill #2

## 2017-04-02 MED FILL — ESOMEPRAZOLE MAG DR 40 MG C: 40 | 90 days supply | Qty: 90 | Fill #4

## 2017-04-02 MED FILL — ATORVASTATIN 10 MG TABLET: 10 | 90 days supply | Qty: 90 | Fill #3

## 2017-06-12 ENCOUNTER — Other Ambulatory Visit: Payer: Self-pay | Admitting: Internal Medicine

## 2017-06-12 MED FILL — MELOXICAM 15 MG TABLET: 15 | 90 days supply | Qty: 90 | Fill #0

## 2017-06-18 ENCOUNTER — Other Ambulatory Visit: Payer: Self-pay | Admitting: Internal Medicine

## 2017-06-18 MED FILL — SILDENAFIL CITRATE 100 MG T: 100 | 90 days supply | Qty: 18 | Fill #0

## 2017-06-19 MED FILL — ESOMEPRAZOLE MAG DR 40 MG C: 40 | 90 days supply | Qty: 90 | Fill #0

## 2017-06-19 MED FILL — AMLODIPINE BESYLATE 10 MG T: 10 | 90 days supply | Qty: 90 | Fill #0

## 2017-06-19 MED FILL — METFORMIN HCL ER 500 MG TAB: 500 | 90 days supply | Qty: 90 | Fill #0

## 2017-06-19 MED FILL — ATORVASTATIN 10 MG TABLET: 10 | 90 days supply | Qty: 90 | Fill #0

## 2017-06-26 MED FILL — LISINOPRIL 10 MG TABS: 10 | 90 days supply | Qty: 90 | Fill #3

## 2017-06-26 MED FILL — FUROSEMIDE 40 MG TAB: 40 | 90 days supply | Qty: 90 | Fill #2

## 2017-07-10 ENCOUNTER — Ambulatory Visit: Payer: 59 | Admitting: Internal Medicine

## 2017-07-10 ENCOUNTER — Encounter: Payer: Self-pay | Admitting: Internal Medicine

## 2017-07-10 VITALS — BP 160/100 | HR 76 | Temp 98.0°F | Ht 70.0 in | Wt 292.0 lb

## 2017-07-10 DIAGNOSIS — L089 Local infection of the skin and subcutaneous tissue, unspecified: Secondary | ICD-10-CM

## 2017-07-10 DIAGNOSIS — L03032 Cellulitis of left toe: Secondary | ICD-10-CM

## 2017-07-10 MED ORDER — LEVOFLOXACIN 500 MG PO TABS
500.0000 mg | ORAL_TABLET | Freq: Every day | ORAL | 0 refills | Status: DC
Start: 2017-07-10 — End: 2017-10-05

## 2017-07-10 MED ORDER — CEFTRIAXONE SODIUM 1 G IJ SOLR
1.0000 g | Freq: Once | INTRAMUSCULAR | Status: AC
  Administered 2017-07-10: 1 g via INTRAMUSCULAR

## 2017-07-10 MED FILL — levoFLOXacin 500 MG TABS: 500 | 10 days supply | Qty: 10 | Fill #0

## 2017-07-14 ENCOUNTER — Telehealth: Payer: Self-pay

## 2017-07-14 NOTE — Telephone Encounter (Signed)
Patient called to update you on his toe: "the swelling, redness and tenderness went down, he would like to wait until Wednesday and then decide wether he wants to xray it or not".

## 2017-07-26 NOTE — Patient Instructions (Signed)
Rocephin 1 g IM.  Levaquin 500 mg daily for 10 days.  Soak toe in warm soapy water for 20 minutes at least once a day.  Call with progress report in 24-48 hours.

## 2017-07-26 NOTE — Progress Notes (Signed)
   Subjective:    Patient ID: [name] [name], male    DOB: 12/09/1950, 67 y.o.   MRN: [id number]  HPI 67 year old Black Male with  left great toe pain.  [place]  Does not recall any particular injury to his toes.  Says yesterday it was very painful while working.  He is on his feet a lot at work.  Less painful today.  No prior history of gout.    Review of Systems See above    Objective:   Physical Exam        Assessment & Plan:  Seems to have paronychia left great toe  Plan: Rocephin 1 g IM.  Levaquin 500 mg daily for 10 days.  Is to soak toe in warm soapy water for 20 minutes at least once a day and call with progress report in 24-48 hours.

## 2017-07-30 DIAGNOSIS — M79675 Pain in left toe(s): Secondary | ICD-10-CM | POA: Diagnosis not present

## 2017-08-26 ENCOUNTER — Other Ambulatory Visit: Payer: Self-pay

## 2017-08-26 DIAGNOSIS — E119 Type 2 diabetes mellitus without complications: Secondary | ICD-10-CM

## 2017-08-26 DIAGNOSIS — Z8639 Personal history of other endocrine, nutritional and metabolic disease: Secondary | ICD-10-CM

## 2017-08-26 DIAGNOSIS — Z0000 Encounter for general adult medical examination without abnormal findings: Secondary | ICD-10-CM

## 2017-08-26 DIAGNOSIS — I10 Essential (primary) hypertension: Secondary | ICD-10-CM

## 2017-08-26 DIAGNOSIS — Z125 Encounter for screening for malignant neoplasm of prostate: Secondary | ICD-10-CM

## 2017-09-10 MED FILL — ATORVASTATIN 10 MG TABLET: 10 | 90 days supply | Qty: 90 | Fill #1

## 2017-09-10 MED FILL — MELOXICAM 15 MG TABLET: 15 | 90 days supply | Qty: 90 | Fill #1

## 2017-09-10 MED FILL — ESOMEPRAZOLE MAG DR 40 MG C: 40 | 90 days supply | Qty: 90 | Fill #1

## 2017-09-10 MED FILL — METFORMIN HCL ER 500 MG TAB: 500 | 90 days supply | Qty: 90 | Fill #1

## 2017-09-10 MED FILL — AMLODIPINE BESYLATE 10 MG T: 10 | 90 days supply | Qty: 90 | Fill #1

## 2017-09-10 MED FILL — SILDENAFIL CITRATE 100 MG T: 100 | 90 days supply | Qty: 18 | Fill #1

## 2017-09-15 ENCOUNTER — Other Ambulatory Visit: Payer: 59 | Admitting: Internal Medicine

## 2017-09-15 DIAGNOSIS — Z125 Encounter for screening for malignant neoplasm of prostate: Secondary | ICD-10-CM

## 2017-09-15 DIAGNOSIS — I10 Essential (primary) hypertension: Secondary | ICD-10-CM | POA: Diagnosis not present

## 2017-09-15 DIAGNOSIS — E119 Type 2 diabetes mellitus without complications: Secondary | ICD-10-CM | POA: Diagnosis not present

## 2017-09-15 DIAGNOSIS — Z8639 Personal history of other endocrine, nutritional and metabolic disease: Secondary | ICD-10-CM

## 2017-09-15 DIAGNOSIS — Z0000 Encounter for general adult medical examination without abnormal findings: Secondary | ICD-10-CM

## 2017-09-16 LAB — CBC WITH DIFFERENTIAL/PLATELET
BASOS PCT: 0.6 %
Basophils Absolute: 29 cells/uL (ref 0–200)
EOS ABS: 118 {cells}/uL (ref 15–500)
Eosinophils Relative: 2.4 %
HEMATOCRIT: 41.9 % (ref 38.5–50.0)
HEMOGLOBIN: 13.9 g/dL (ref 13.2–17.1)
Lymphs Abs: 2852 cells/uL (ref 850–3900)
MCH: 30.4 pg (ref 27.0–33.0)
MCHC: 33.2 g/dL (ref 32.0–36.0)
MCV: 91.7 fL (ref 80.0–100.0)
MPV: 10.7 fL (ref 7.5–12.5)
Monocytes Relative: 11.6 %
NEUTROS ABS: 1333 {cells}/uL — AB (ref 1500–7800)
Neutrophils Relative %: 27.2 %
Platelets: 243 10*3/uL (ref 140–400)
RBC: 4.57 10*6/uL (ref 4.20–5.80)
RDW: 12.1 % (ref 11.0–15.0)
Total Lymphocyte: 58.2 %
WBC: 4.9 10*3/uL (ref 3.8–10.8)
WBCMIX: 568 {cells}/uL (ref 200–950)

## 2017-09-16 LAB — LIPID PANEL
CHOL/HDL RATIO: 3 (calc) (ref <5.0)
CHOLESTEROL: 145 mg/dL (ref <200)
HDL: 48 mg/dL (ref >40)
LDL Cholesterol (Calc): 82 mg/dL (calc)
Non-HDL Cholesterol (Calc): 97 mg/dL (calc) (ref <130)
Triglycerides: 71 mg/dL (ref <150)

## 2017-09-16 LAB — COMPLETE METABOLIC PANEL WITH GFR
AG Ratio: 1.2 (calc) (ref 1.0–2.5)
ALT: 14 U/L (ref 9–46)
AST: 17 U/L (ref 10–35)
Albumin: 4.1 g/dL (ref 3.6–5.1)
Alkaline phosphatase (APISO): 89 U/L (ref 40–115)
BUN: 13 mg/dL (ref 7–25)
CALCIUM: 9.3 mg/dL (ref 8.6–10.3)
CO2: 28 mmol/L (ref 20–32)
CREATININE: 1 mg/dL (ref 0.70–1.25)
Chloride: 105 mmol/L (ref 98–110)
GFR, EST NON AFRICAN AMERICAN: 79 mL/min/{1.73_m2} (ref >=60)
GFR, Est African American: 92 mL/min/{1.73_m2} (ref >=60)
GLOBULIN: 3.3 g/dL (ref 1.9–3.7)
Glucose, Bld: 110 mg/dL — ABNORMAL HIGH (ref 65–99)
Potassium: 4.7 mmol/L (ref 3.5–5.3)
SODIUM: 139 mmol/L (ref 135–146)
Total Bilirubin: 1.2 mg/dL (ref 0.2–1.2)
Total Protein: 7.4 g/dL (ref 6.1–8.1)

## 2017-09-16 LAB — MICROALBUMIN / CREATININE URINE RATIO
CREATININE, URINE: 180 mg/dL (ref 20–320)
MICROALB UR: 0.9 mg/dL
MICROALB/CREAT RATIO: 5 ug/mg{creat} (ref <30)

## 2017-09-16 LAB — HEMOGLOBIN A1C
HEMOGLOBIN A1C: 6.4 %{Hb} — AB (ref <5.7)
Mean Plasma Glucose: 137 (calc)
eAG (mmol/L): 7.6 (calc)

## 2017-09-16 LAB — PSA: PSA: 0.6 ng/mL (ref <=4.0)

## 2017-09-19 ENCOUNTER — Ambulatory Visit (INDEPENDENT_AMBULATORY_CARE_PROVIDER_SITE_OTHER): Payer: 59 | Admitting: Internal Medicine

## 2017-09-19 ENCOUNTER — Encounter: Payer: Self-pay | Admitting: Internal Medicine

## 2017-09-19 VITALS — BP 138/74 | HR 85 | Temp 98.7°F | Ht 70.0 in | Wt 283.2 lb

## 2017-09-19 DIAGNOSIS — Z8739 Personal history of other diseases of the musculoskeletal system and connective tissue: Secondary | ICD-10-CM | POA: Diagnosis not present

## 2017-09-19 DIAGNOSIS — I493 Ventricular premature depolarization: Secondary | ICD-10-CM

## 2017-09-19 DIAGNOSIS — E785 Hyperlipidemia, unspecified: Secondary | ICD-10-CM | POA: Diagnosis not present

## 2017-09-19 DIAGNOSIS — M170 Bilateral primary osteoarthritis of knee: Secondary | ICD-10-CM

## 2017-09-19 DIAGNOSIS — E1169 Type 2 diabetes mellitus with other specified complication: Secondary | ICD-10-CM | POA: Diagnosis not present

## 2017-09-19 DIAGNOSIS — R609 Edema, unspecified: Secondary | ICD-10-CM | POA: Diagnosis not present

## 2017-09-19 DIAGNOSIS — E7800 Pure hypercholesterolemia, unspecified: Secondary | ICD-10-CM | POA: Diagnosis not present

## 2017-09-19 DIAGNOSIS — I10 Essential (primary) hypertension: Secondary | ICD-10-CM

## 2017-09-19 DIAGNOSIS — N529 Male erectile dysfunction, unspecified: Secondary | ICD-10-CM

## 2017-09-19 DIAGNOSIS — K219 Gastro-esophageal reflux disease without esophagitis: Secondary | ICD-10-CM

## 2017-09-19 DIAGNOSIS — Z0000 Encounter for general adult medical examination without abnormal findings: Secondary | ICD-10-CM | POA: Diagnosis not present

## 2017-09-19 DIAGNOSIS — Z6841 Body Mass Index (BMI) 40.0 and over, adult: Secondary | ICD-10-CM

## 2017-09-19 DIAGNOSIS — E119 Type 2 diabetes mellitus without complications: Secondary | ICD-10-CM | POA: Diagnosis not present

## 2017-09-19 DIAGNOSIS — I499 Cardiac arrhythmia, unspecified: Secondary | ICD-10-CM

## 2017-09-19 LAB — POCT URINALYSIS DIPSTICK
APPEARANCE: NORMAL
BILIRUBIN UA: NEGATIVE
GLUCOSE UA: NEGATIVE
Ketones, UA: NEGATIVE
Leukocytes, UA: NEGATIVE
Nitrite, UA: NEGATIVE
ODOR: NORMAL
Protein, UA: NEGATIVE
RBC UA: NEGATIVE
Spec Grav, UA: 1.015 (ref 1.010–1.025)
Urobilinogen, UA: 0.2 E.U./dL
pH, UA: 6 (ref 5.0–8.0)

## 2017-09-19 NOTE — Progress Notes (Signed)
Subjective:    Patient ID: [name] [name], male    DOB: 1951-01-13, 67 y.o.   MRN: [id number]  HPI 67 year old Male in today for health maintenance exam and evaluation of medical issues.  History of diabetes, hypertension, obesity, gout, GE reflux, erectile dysfunction.  2 arthritis both knees.  Sees Dr. Chipper Oman for knee issues.  Wife is to have knee replacement in the near future.  She is been having a lot of pain and he is a bit stressed about it.  In 2008 he weighed 300 pounds, had elevated liver functions and at the time was thought to have fatty liver.  These improved after he lost 18 pounds.  History of  gout involving right toe and foot.  History of low testosterone level in 2011 and  AndroGel was prescribed but I do not think he takes it any longer.  Schatzki's ring dilated by Dr. Carlean Purl in 2008.  Hospitalized with acute cholecystitis July 2017.  He had ERCP and laparoscopic cholecystectomy.  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  He was able to keep his benefits.  He does not work nights weekends or holidays.  He does not smoke.  Wife is retired from the school system.  They are active in their church.  He does not consume alcohol.  One son with history of kidney transplant due to severe hypertension and chronic kidney disease.  3 adult children, 2 sons and a daughter.  Family history: Mother died at age 50 in 20 of a CVA.    Review of Systems bilateral knee pain     Objective:   Physical Exam  Constitutional: He is oriented to person, place, and time. He appears well-developed and well-nourished. No distress.  HENT:  Head: Normocephalic and atraumatic.  Right Ear: External ear normal.  Left Ear: External ear normal.  Eyes: Pupils are equal, round, and reactive to light. Conjunctivae and EOM are normal. Right eye  exhibits no discharge. Left eye exhibits no discharge. No scleral icterus.  Neck: Neck supple. No JVD present. No thyromegaly present.  Cardiovascular: Normal heart sounds.  No murmur heard. Irregular contractions noted.  EKG shows PVCs  Pulmonary/Chest: Effort normal and breath sounds normal. No respiratory distress. He has no wheezes. He has no rales.  Abdominal: Soft. Bowel sounds are normal. He exhibits no distension and no mass. There is no tenderness. There is no rebound and no guarding.  Genitourinary: Prostate normal.  Musculoskeletal: He exhibits no edema.  Lymphadenopathy:    He has no cervical adenopathy.  Neurological: He is alert and oriented to person, place, and time. He has normal reflexes. No cranial nerve deficit. Coordination normal.  Skin: Skin is warm and dry. He is not diaphoretic.  Psychiatric: He has a normal mood and affect. His behavior is normal. Thought content normal.  Vitals reviewed.         Assessment & Plan:  Controlled type 2 diabetes mellitus-hemoglobin A1c 6.4% and previously was 6.2%.  Needs to watch diet.  He is on metformin  Lipid panel is normal-he is on Lipitor 10 mg daily  Health maintenance PSA is normal  Essential hypertension treated with Norvasc and Lasix  GE reflux treated with Nexium  Chronic knee pain treated with Mobic  PVCs-just noticed today on exam.  He is under some stress with his wife and  upcoming knee replacement for her.  I have recommended a Holter monitor and that I would like for him to have a stress test.  He can wait until his wife has recovered from her knee surgery to do this.  He seems anxious about having to do this but it woul be prudent to have him evaluated.  Plan: Continue to work on diet exercise and weight loss.  Continue same medications and return in 6 months.  To see cardiologist regarding stress test and have 24-hour Holter monitor.

## 2017-09-22 MED FILL — LISINOPRIL 10 MG TABS: 10 | 90 days supply | Qty: 90 | Fill #4

## 2017-09-22 MED FILL — FUROSEMIDE 40 MG TAB: 40 | 90 days supply | Qty: 90 | Fill #3

## 2017-10-03 ENCOUNTER — Telehealth: Payer: Self-pay | Admitting: Internal Medicine

## 2017-10-03 NOTE — Telephone Encounter (Signed)
I put the Cardiology referral in as you had advised.  Apparently they called him today to schedule an appointment.  Of course he is just brining Lelan Pons home today and getting her scheduled.  I put in the referral to schedule him for LATE April.    He just called and is ALL upset and wants to know why he is being referred to Cardiology.  He wished you would have talked to him first.  He hasn't even had the Holter monitor yet.  States you told him everything was ok the day he was here and now all of a sudden, he's headed to Cardiology and he's just getting really upset.  Told the patient to take a deep breath and let's just back up a few steps.  There's no reason to be all upset.  We'll get to the solution and all will be fine.    Said if he has to go to Cardiology, he wants to see Dr. Pernell Dupre.  Advised I will add that to his referral, but that is going to take a while to get in to see him, but I will make that a request for him.    He is wanting to know what's going on and why he needs to be seen.  He wants to hear from you when you have a break.  He respects your time, but he just doesn't understand what happened or what's going on.    Phone #:  [phone]  Thank you.

## 2017-10-03 NOTE — Telephone Encounter (Signed)
Have spoken with him just now. Please see if Cardiology will put off his Holter monitor a few weeks til [name] [name] gets better. Then he wants to see Dr. Pernell Dupre and May will be OK. If he is not taking new patients then I will have to call personally.

## 2017-10-05 DIAGNOSIS — E1169 Type 2 diabetes mellitus with other specified complication: Secondary | ICD-10-CM | POA: Insufficient documentation

## 2017-10-05 DIAGNOSIS — I493 Ventricular premature depolarization: Secondary | ICD-10-CM | POA: Insufficient documentation

## 2017-10-05 DIAGNOSIS — Z6841 Body Mass Index (BMI) 40.0 and over, adult: Secondary | ICD-10-CM | POA: Insufficient documentation

## 2017-10-05 DIAGNOSIS — E785 Hyperlipidemia, unspecified: Secondary | ICD-10-CM

## 2017-10-05 NOTE — Patient Instructions (Signed)
It was a pleasure to see today.  After wife has completed the surgery and recovered please have 24-hour Holter monitor and see cardiologist regarding stress test.  Continue same medications.  Please work on diet exercise and weight loss.

## 2017-10-28 ENCOUNTER — Encounter: Payer: Self-pay | Admitting: Internal Medicine

## 2017-10-28 ENCOUNTER — Ambulatory Visit: Payer: 59 | Admitting: Internal Medicine

## 2017-10-28 VITALS — BP 150/90 | HR 110 | Ht 70.0 in | Wt 300.0 lb

## 2017-10-28 DIAGNOSIS — M5441 Lumbago with sciatica, right side: Secondary | ICD-10-CM | POA: Diagnosis not present

## 2017-10-28 MED ORDER — PREDNISONE 10 MG PO TABS: ORAL_TABLET | ORAL | 0 refills | Status: DC

## 2017-10-28 MED ORDER — CYCLOBENZAPRINE HCL 10 MG PO TABS: ORAL_TABLET | ORAL | 1 refills | Status: DC

## 2017-10-28 MED ORDER — HYDROCODONE-ACETAMINOPHEN 5-325 MG PO TABS: 1.0000 | ORAL_TABLET | Freq: Four times a day (QID) | ORAL | 0 refills | Status: DC | PRN

## 2017-10-28 MED FILL — CYCLOBENZAPRINE 10 MG TAB: 10 | 30 days supply | Qty: 30 | Fill #0

## 2017-10-28 MED FILL — predniSONE 10 MG TABS: 10 | 6 days supply | Qty: 21 | Fill #0

## 2017-10-28 MED FILL — HYDROCODON-APAP 5-325: 5-325 | 5 days supply | Qty: 20 | Fill #0

## 2017-10-28 NOTE — Patient Instructions (Addendum)
Take prednisone in tapering course as directed.  Flexeril 1/2 tablet at bedtime.  Out of work today and tomorrow.  Note provided to be out of work.  Norco 5/325 every 6 hours as needed pain.  No heavy lifting for 3 weeks minimal

## 2017-10-28 NOTE — Progress Notes (Signed)
   Subjective:    Patient ID: [name] [name], male    DOB: Feb 15, 1951, 67 y.o.   MRN: [id number]  HPI 67 year old Male in today with right-sided low back pain.  Pain was very severe this morning says that he could not go to work.  Recently he went to [place] and lifted several 50 pound bags for gardening.  He also lifted several heavy poles.  His wife recently had knee arthroplasty and he has been busy helping her going up and down steps, helping her with her physical therapy exercises etc.    Review of Systems see above-pain does not radiate into her right leg mainly stays in right back and buttock area     Objective:   Physical Exam  Straight leg raising is negative at 90 degrees bilaterally.  Deep tendon reflexes are 2+ and symmetrical in the knees 1+ and symmetrical in the ankles.  Muscle strength is 5/5 in the lower extremities bilaterally.      Assessment & Plan:  Right low back strain/sciatica  Plan: Prednisone 10 mg (#21) going from 60 mg to 0 mg over 7 days.  Flexeril 10 mg to take 1/2 tablet at bedtime.  Norco 5/325 1 p.o. every 6 hours as needed pain #20.  No heavy lifting for 3 weeks minimum.

## 2017-11-03 ENCOUNTER — Telehealth: Payer: Self-pay

## 2017-11-03 MED ORDER — CYCLOBENZAPRINE HCL 10 MG PO TABS: ORAL_TABLET | ORAL | 1 refills | Status: DC

## 2017-11-03 MED ORDER — PREDNISONE 10 MG PO TABS: ORAL_TABLET | ORAL | 0 refills | Status: DC

## 2017-11-03 MED FILL — predniSONE 10 MG TABS: 10 | 6 days supply | Qty: 21 | Fill #0

## 2017-11-03 NOTE — Telephone Encounter (Signed)
Patient was notified, he said he will take the medications stay off of work and stay off his back and doesn't want to do PT at this time. Medications were refilled.

## 2017-11-03 NOTE — Telephone Encounter (Signed)
Patient called states his back is not better, he finished the prednisone and got a few pills of the flexeril left and he feels like these medications didn't do much for him.

## 2017-11-03 NOTE — Telephone Encounter (Signed)
Needs to refill these meds and give it more time. Will he go to physical therapy

## 2017-12-09 ENCOUNTER — Other Ambulatory Visit: Payer: Self-pay | Admitting: Internal Medicine

## 2017-12-09 MED FILL — METFORMIN HCL ER 500 MG TAB: 500 | 90 days supply | Qty: 90 | Fill #2

## 2017-12-09 MED FILL — SILDENAFIL CITRATE 100 MG T: 100 | 90 days supply | Qty: 18 | Fill #2

## 2017-12-09 MED FILL — ESOMEPRAZOLE MAG DR 40 MG C: 40 | 90 days supply | Qty: 90 | Fill #2

## 2017-12-09 MED FILL — AMLODIPINE BESYLATE 10 MG T: 10 | 90 days supply | Qty: 90 | Fill #2

## 2017-12-09 MED FILL — ATORVASTATIN 10 MG TABLET: 10 | 90 days supply | Qty: 90 | Fill #2

## 2017-12-09 NOTE — Progress Notes (Signed)
Cardiology Office Note    Date:  12/10/2017   ID:  [name] [name], DOB 1951/01/18, MRN [id number]  PCP:  Elby Showers, MD  Cardiologist: Sinclair Grooms, MD   Chief Complaint  Patient presents with  . Irregular Heart Beat    History of Present Illness:  [name] [name] is a 67 y.o. male with multiple cardiovascular risk factors and other comorbidities who is referred by Dr. Tommie Ard Baxley for evaluation of premature ventricular contractions.  Premature ventricular contractions noted coincidentally on auscultation and EKG at Dr. Verlene Mayer office.  Otherwise no complaints and specifically denies chest pain, orthopnea, dyspnea on exertion, edema, syncope, and physical limitations.  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  Past Medical History:  Diagnosis Date  . Arthritis   . Diabetes mellitus without complication ([place])   . Hypertension     Past Surgical History:  Procedure Laterality Date  . CHOLECYSTECTOMY N/A 02/04/2016   Procedure: LAPAROSCOPIC CHOLECYSTECTOMY;  Surgeon: Leighton Ruff, MD;  Location: [facility];  Service: General;  Laterality: N/A;  . ERCP N/A 02/02/2016   Procedure: ENDOSCOPIC RETROGRADE CHOLANGIOPANCREATOGRAPHY (ERCP);  Surgeon: [name], MD;  Location: [facility];  Service: Endoscopy;  Laterality: N/A;  . SPINE SURGERY      Current Medications: Outpatient Medications Prior to Visit  Medication Sig Dispense Refill  . amLODipine (NORVASC) 10 MG tablet Take 1 tablet (10 mg total) by mouth daily. 90 tablet 3  . atorvastatin (LIPITOR) 10 MG tablet Take 1 tablet (10 mg total) by mouth daily. 90 tablet 3  . esomeprazole (NEXIUM) 40 MG capsule Take 1 capsule (40 mg total) by mouth daily. 90 capsule 4  . furosemide (LASIX) 40 MG tablet Take one tablet by mouth daily 90 tablet 3  . lisinopril (PRINIVIL,ZESTRIL) 10 MG tablet Take 10 mg by mouth daily.  99  . meloxicam (MOBIC) 15 MG tablet TAKE 1 TABLET BY MOUTH ONCE  DAILY 90 tablet 1  . metFORMIN (GLUCOPHAGE-XR) 500 MG 24 hr tablet Take 1 tablet (500 mg total) by mouth daily with breakfast. 90 tablet 4  . cyclobenzaprine (FLEXERIL) 10 MG tablet TAKE 1/2 TO 1 TAB AT BED TIME (Patient not taking: Reported on 12/10/2017) 30 tablet 1  . HYDROcodone-acetaminophen (NORCO) 5-325 MG tablet Take 1 tablet by mouth every 6 (six) hours as needed for moderate pain. (Patient not taking: Reported on 12/10/2017) 20 tablet 0  . lisinopril (PRINIVIL,ZESTRIL) 20 MG tablet Take 1 tablet (20 mg total) by mouth daily. 90 tablet 3  . predniSONE (DELTASONE) 10 MG tablet TAKE IN TAPERING COURSE 6,5,4,3,2,1 (Patient not taking: Reported on 12/10/2017) 21 tablet 0  . sildenafil (VIAGRA) 100 MG tablet TAKE 1 TABLET (100 MG TOTAL) BY MOUTH DAILY AS NEEDED. (Patient not taking: Reported on 12/10/2017) 18 tablet 11   No facility-administered medications prior to visit.      Allergies:   Patient has no known allergies.   Social History   Socioeconomic History  . Marital status: Married    Spouse name: Not on file  . Number of children: Not on file  . Years of education: Not on file  . Highest education level: Not on file  Occupational History  . Not on file  Social Needs  . Financial resource strain: Not on file  . Food insecurity:    Worry: Not on file    Inability: Not on file  . Transportation needs:  Medical: Not on file    Non-medical: Not on file  Tobacco Use  . Smoking status: Never Smoker  . Smokeless tobacco: Never Used  Substance and Sexual Activity  . Alcohol use: No  . Drug use: No  . Sexual activity: Yes  Lifestyle  . Physical activity:    Days per week: Not on file    Minutes per session: Not on file  . Stress: Not on file  Relationships  . Social connections:    Talks on phone: Not on file    Gets together: Not on file    Attends religious service: Not on file    Active member of club or organization: Not on file    Attends meetings of clubs or  organizations: Not on file    Relationship status: Not on file  Other Topics Concern  . Not on file  Social History Narrative  . Not on file     Family History:  The patient's family history includes Hypertension in his mother; Stroke in his mother.   ROS:   Please see the history of present illness.    Wife complains that he snores All other systems reviewed and are negative.   PHYSICAL EXAM:   VS:  BP (!) 152/82   Pulse (!) 112   Ht 5\' 10"  (1.778 m)   Wt (!) 301 lb 6.4 oz (136.7 kg)   BMI 43.25 kg/m    GEN: Well nourished, well developed, in no acute distress  HEENT: normal  Neck: no JVD, carotid bruits, or masses Cardiac: RRR; no murmurs, rubs, or edema.  S4 gallop is heard intermittently.  At the [facility] premature beats are noted. Respiratory:  clear to auscultation bilaterally, normal work of breathing GI: soft, nontender, nondistended, + BS MS: no deformity or atrophy  Skin: warm and dry, no rash Neuro:  Alert and Oriented x 3, Strength and sensation are intact Psych: euthymic mood, full affect  Wt Readings from Last 3 Encounters:  12/10/17 (!) 301 lb 6.4 oz (136.7 kg)  10/28/17 300 lb (136.1 kg)  09/19/17 283 lb 4 oz (128.5 kg)      Studies/Labs Reviewed:   EKG:  EKG EKG performed during office visit with Dr. Renold Genta reveals premature ventricular contractions, multiformed, and prominent voltage consistent with LVH.  Recent Labs: 09/15/2017: ALT 14; BUN 13; Creat 1.00; Hemoglobin 13.9; Platelets 243; Potassium 4.7; Sodium 139   Lipid Panel    Component Value Date/Time   CHOL 145 09/15/2017 1037   TRIG 71 09/15/2017 1037   HDL 48 09/15/2017 1037   CHOLHDL 3.0 09/15/2017 1037   VLDL 14 10/18/2016 1048   LDLCALC 82 09/15/2017 1037    Additional studies/ records that were reviewed today include:  None    ASSESSMENT:    1. PVC's (premature ventricular contractions)   2. Type 2 diabetes mellitus with complication, without long-term current use of insulin  ([place])   3. Hyperlipidemia associated with type 2 diabetes mellitus ([place])   4. Erectile dysfunction, unspecified erectile dysfunction type   5. Essential hypertension   6. Class 1 obesity due to excess calories with serious comorbidity in adult, unspecified BMI      PLAN:  In order of problems listed above:  1. Uncertain etiology.  24-hour Holter to identify quantity.  There are findings on exam including gallop an EKG that demonstrated which raise the question of structural/functional abnormality.  Therefore a 2D Doppler echocardiogram will be performed. 2. Does not feel he has diabetes  but has prediabetes.  Last hemoglobin A1c was less than 7. 3. Needs LDL cholesterol less than 70 4. Evidence of endothelial dysfunction 5. Target blood pressure should be 130/80 mmHg.  Significant elevation today.  Discussed salt restriction, weight loss, and aerobic activity.  Since he snores, at some point we should consider screening for sleep apnea given his body type.   1 month follow-up for repeat blood pressure, discussion of the results, discussion of medical results, and adjustment of medical therapy based upon that database.    Medication Adjustments/Labs and Tests Ordered: Current medicines are reviewed at length with the patient today.  Concerns regarding medicines are outlined above.  Medication changes, Labs and Tests ordered today are listed in the Patient Instructions below. There are no Patient Instructions on file for this visit.   Signed, Sinclair Grooms, MD  12/10/2017 9:22 AM    [facility] [address], [place], [place]  [address] Phone: [phone]; Fax: [phone]

## 2017-12-09 NOTE — Telephone Encounter (Signed)
Please call pharmacy. I am confused. Our records say he is on Lisinopril 20 mg daily

## 2017-12-10 ENCOUNTER — Encounter: Payer: Self-pay | Admitting: Interventional Cardiology

## 2017-12-10 ENCOUNTER — Ambulatory Visit (INDEPENDENT_AMBULATORY_CARE_PROVIDER_SITE_OTHER): Payer: 59 | Admitting: Interventional Cardiology

## 2017-12-10 ENCOUNTER — Encounter (INDEPENDENT_AMBULATORY_CARE_PROVIDER_SITE_OTHER): Payer: Self-pay

## 2017-12-10 VITALS — BP 152/82 | HR 112 | Ht 70.0 in | Wt 301.4 lb

## 2017-12-10 DIAGNOSIS — E1169 Type 2 diabetes mellitus with other specified complication: Secondary | ICD-10-CM

## 2017-12-10 DIAGNOSIS — N529 Male erectile dysfunction, unspecified: Secondary | ICD-10-CM

## 2017-12-10 DIAGNOSIS — E785 Hyperlipidemia, unspecified: Secondary | ICD-10-CM | POA: Diagnosis not present

## 2017-12-10 DIAGNOSIS — I493 Ventricular premature depolarization: Secondary | ICD-10-CM

## 2017-12-10 DIAGNOSIS — I10 Essential (primary) hypertension: Secondary | ICD-10-CM | POA: Diagnosis not present

## 2017-12-10 DIAGNOSIS — E6609 Other obesity due to excess calories: Secondary | ICD-10-CM | POA: Diagnosis not present

## 2017-12-10 DIAGNOSIS — E118 Type 2 diabetes mellitus with unspecified complications: Secondary | ICD-10-CM | POA: Diagnosis not present

## 2017-12-10 NOTE — Patient Instructions (Signed)
Medication Instructions:  Your physician recommends that you continue on your current medications as directed. Please refer to the Current Medication list given to you today.  Labwork: None  Testing/Procedures: Your physician has requested that you have an echocardiogram. Echocardiography is a painless test that uses sound waves to create images of your heart. It provides your doctor with information about the size and shape of your heart and how well your heart's chambers and valves are working. This procedure takes approximately one hour. There are no restrictions for this procedure.  Your physician has recommended that you wear a holter monitor. Holter monitors are medical devices that record the heart's electrical activity. Doctors most often use these monitors to diagnose arrhythmias. Arrhythmias are problems with the speed or rhythm of the heartbeat. The monitor is a small, portable device. You can wear one while you do your normal daily activities. This is usually used to diagnose what is causing palpitations/syncope (passing out).   Follow-Up: Your physician recommends that you schedule a follow-up appointment in: 4-6 weeks with Dr. Tamala Julian or a PA or NP.    Any Other Special Instructions Will Be Listed Below (If Applicable).     If you need a refill on your cardiac medications before your next appointment, please call your pharmacy.

## 2017-12-13 ENCOUNTER — Emergency Department (HOSPITAL_COMMUNITY): Payer: 59

## 2017-12-13 ENCOUNTER — Emergency Department (HOSPITAL_COMMUNITY)
Admission: EM | Admit: 2017-12-13 | Discharge: 2017-12-13 | Disposition: A | Discharge status: Home / Self Care | Payer: 59 | Attending: Emergency Medicine | Admitting: Emergency Medicine

## 2017-12-13 ENCOUNTER — Encounter (HOSPITAL_COMMUNITY): Payer: Self-pay | Admitting: Emergency Medicine

## 2017-12-13 DIAGNOSIS — E119 Type 2 diabetes mellitus without complications: Secondary | ICD-10-CM | POA: Diagnosis not present

## 2017-12-13 DIAGNOSIS — Z7984 Long term (current) use of oral hypoglycemic drugs: Secondary | ICD-10-CM | POA: Insufficient documentation

## 2017-12-13 DIAGNOSIS — R197 Diarrhea, unspecified: Secondary | ICD-10-CM | POA: Diagnosis not present

## 2017-12-13 DIAGNOSIS — E860 Dehydration: Secondary | ICD-10-CM

## 2017-12-13 DIAGNOSIS — R1010 Upper abdominal pain, unspecified: Secondary | ICD-10-CM | POA: Insufficient documentation

## 2017-12-13 DIAGNOSIS — Z79899 Other long term (current) drug therapy: Secondary | ICD-10-CM | POA: Diagnosis not present

## 2017-12-13 DIAGNOSIS — R109 Unspecified abdominal pain: Secondary | ICD-10-CM | POA: Diagnosis not present

## 2017-12-13 DIAGNOSIS — I10 Essential (primary) hypertension: Secondary | ICD-10-CM | POA: Diagnosis not present

## 2017-12-13 LAB — COMPREHENSIVE METABOLIC PANEL
ALT: 27 U/L (ref 17–63)
AST: 22 U/L (ref 15–41)
Albumin: 3.7 g/dL (ref 3.5–5.0)
Alkaline Phosphatase: 75 U/L (ref 38–126)
Anion gap: 11 (ref 5–15)
BILIRUBIN TOTAL: 1.4 mg/dL — AB (ref 0.3–1.2)
BUN: 14 mg/dL (ref 6–20)
CHLORIDE: 103 mmol/L (ref 101–111)
CO2: 26 mmol/L (ref 22–32)
CREATININE: 0.97 mg/dL (ref 0.61–1.24)
Calcium: 8.8 mg/dL — ABNORMAL LOW (ref 8.9–10.3)
GFR calc Af Amer: >60 mL/min (ref >60)
Glucose, Bld: 119 mg/dL — ABNORMAL HIGH (ref 65–99)
Potassium: 4.3 mmol/L (ref 3.5–5.1)
Sodium: 140 mmol/L (ref 135–145)
TOTAL PROTEIN: 7.8 g/dL (ref 6.5–8.1)

## 2017-12-13 LAB — URINALYSIS, ROUTINE W REFLEX MICROSCOPIC
BACTERIA UA: NONE SEEN
Bilirubin Urine: NEGATIVE
GLUCOSE, UA: NEGATIVE mg/dL
Ketones, ur: 20 mg/dL — AB
LEUKOCYTES UA: NEGATIVE
NITRITE: NEGATIVE
PH: 5 (ref 5.0–8.0)
PROTEIN: NEGATIVE mg/dL
Specific Gravity, Urine: 1.027 (ref 1.005–1.030)

## 2017-12-13 LAB — LIPASE, BLOOD: Lipase: 23 U/L (ref 11–51)

## 2017-12-13 MED ORDER — IOPAMIDOL (ISOVUE-300) INJECTION 61%
100.0000 mL | Freq: Once | INTRAVENOUS | Status: AC | PRN
  Administered 2017-12-13: 100 mL via INTRAVENOUS

## 2017-12-13 MED ORDER — SODIUM CHLORIDE 0.9 % IV BOLUS
1000.0000 mL | Freq: Once | INTRAVENOUS | Status: AC
  Administered 2017-12-13: 1000 mL via INTRAVENOUS

## 2017-12-13 MED ORDER — IOPAMIDOL (ISOVUE-300) INJECTION 61%
INTRAVENOUS | Status: AC
  Filled 2017-12-13: qty 100

## 2017-12-13 NOTE — ED Triage Notes (Signed)
Pt c/o loose stools since Tuesday. Pt has mid abd pains as well. Denies n/v or urinary problems.

## 2017-12-13 NOTE — ED Provider Notes (Signed)
[facility] DEPT Provider Note   CSN: [id number] Arrival date & time: 12/13/17  6283     History   Chief Complaint Chief Complaint  Patient presents with  . Diarrhea    HPI [name] [name] is a 67 y.o. male.  HPI 67 year old male presents emergency department 3 days of thin watery diarrhea.  He states generalized weakness.  He had some mild diaphoresis and possible fever this morning.  Reports crampy intermittent mid abdominal pain.  Denies nausea vomiting.  No recent travel outside the country.  No recent antibiotics.  He does work in the operating room in a hospital setting.  No known diarrheal exposures.symptoms are moderate in severity   Past Medical History:  Diagnosis Date  . Arthritis   . Diabetes mellitus without complication ([place])   . Hypertension     Patient Active Problem List   Diagnosis Date Noted  . PVC's (premature ventricular contractions) 10/05/2017  . Hyperlipidemia associated with type 2 diabetes mellitus ([place]) 10/05/2017  . BMI 40.0-44.9, adult ([place]) 10/05/2017  . Metabolic syndrome 15/17/6160  . History of gout 07/04/2013  . Low serum testosterone level 07/04/2013  . GE reflux 07/06/2012  . Type 2 diabetes mellitus with complication, without long-term current use of insulin ([place]) 03/09/2012  . Osteoarthritis of both knees 03/09/2012  . Obesity 03/09/2012  . Erectile dysfunction 03/02/2012  . Hypertension 03/02/2012    Past Surgical History:  Procedure Laterality Date  . CHOLECYSTECTOMY N/A 02/04/2016   Procedure: LAPAROSCOPIC CHOLECYSTECTOMY;  Surgeon: Leighton Ruff, MD;  Location: [facility];  Service: General;  Laterality: N/A;  . ERCP N/A 02/02/2016   Procedure: ENDOSCOPIC RETROGRADE CHOLANGIOPANCREATOGRAPHY (ERCP);  Surgeon: [name], MD;  Location: [facility];  Service: Endoscopy;  Laterality: N/A;  . HERNIA REPAIR    . SPINE SURGERY          Home Medications    Prior to Admission medications     Medication Sig Start Date End Date Taking? Authorizing Provider  amLODipine (NORVASC) 10 MG tablet Take 1 tablet (10 mg total) by mouth daily. 07/05/16   Elby Showers, MD  atorvastatin (LIPITOR) 10 MG tablet Take 1 tablet (10 mg total) by mouth daily. 07/05/16   Elby Showers, MD  esomeprazole (NEXIUM) 40 MG capsule Take 1 capsule (40 mg total) by mouth daily. 04/03/16   Elby Showers, MD  furosemide (LASIX) 40 MG tablet Take one tablet by mouth daily 01/02/17   Elby Showers, MD  lisinopril (PRINIVIL,ZESTRIL) 10 MG tablet Take 10 mg by mouth daily. 09/22/17   [provider]  meloxicam (MOBIC) 15 MG tablet TAKE 1 TABLET BY MOUTH ONCE DAILY 06/12/17   Elby Showers, MD  metFORMIN (GLUCOPHAGE-XR) 500 MG 24 hr tablet Take 1 tablet (500 mg total) by mouth daily with breakfast. 04/03/16   Baxley, Cresenciano Lick, MD    Family History Family History  Problem Relation Age of Onset  . Stroke Mother   . Hypertension Mother     Social History Social History   Tobacco Use  . Smoking status: Never Smoker  . Smokeless tobacco: Never Used  Substance Use Topics  . Alcohol use: No  . Drug use: No     Allergies   Patient has no known allergies.   Review of Systems Review of Systems  All other systems reviewed and are negative.    Physical Exam Updated Vital Signs BP (!) 143/113 (BP Location: Left Arm)   Pulse [name])  104   Temp 97.8 F (36.6 C) (Oral)   Resp 20   SpO2 97%   Physical Exam  Constitutional: He is oriented to person, place, and time. He appears well-developed and well-nourished.  HENT:  Head: Normocephalic.  Eyes: EOM are normal.  Neck: Normal range of motion.  Cardiovascular: Normal rate.  Pulmonary/Chest: Effort normal and breath sounds normal.  Abdominal: Soft. He exhibits no distension. There is no tenderness.  Musculoskeletal: Normal range of motion.  Neurological: He is alert and oriented to person, place, and time.  Psychiatric: He has a normal mood and  affect.  Nursing note and vitals reviewed.    ED Treatments / Results  Labs (all labs ordered are listed, but only abnormal results are displayed) Labs Reviewed  COMPREHENSIVE METABOLIC PANEL - Abnormal; Notable for the following components:      Result Value   Glucose, Bld 119 (*)    Calcium 8.8 (*)    Total Bilirubin 1.4 (*)    All other components within normal limits  LIPASE, BLOOD  CBC  URINALYSIS, ROUTINE W REFLEX MICROSCOPIC    EKG None  Radiology Ct Abdomen Pelvis W Contrast  Result Date: 12/13/2017 CLINICAL DATA:  Abdominal pain, loose stools, diarrhea EXAM: CT ABDOMEN AND PELVIS WITH CONTRAST TECHNIQUE: Multidetector CT imaging of the abdomen and pelvis was performed using the standard protocol following bolus administration of intravenous contrast. CONTRAST:  133mL ISOVUE-300 IOPAMIDOL (ISOVUE-300) INJECTION 61% COMPARISON:  02/01/2016 MRI FINDINGS: Lower chest: Minor bibasilar atelectasis. Normal heart size. No pericardial or pleural effusion. Small hiatal hernia noted. Diffuse thoracic spondylosis with large osteophytes. Hepatobiliary: No focal liver abnormality is seen. Status post cholecystectomy. No biliary dilatation. Pancreas: Unremarkable. No pancreatic ductal dilatation or surrounding inflammatory changes. Spleen: Normal in size without focal abnormality. Adrenals/Urinary Tract: Normal adrenal glands. No renal obstruction, hydronephrosis, hydroureter, or ureteral calculus. Punctate nonobstructing right lower pole intrarenal calculus noted measuring 4 mm, image 42 series 2. No other focal renal abnormality. Stomach/Bowel: Several loops of jejunum and ileum are mildly dilated and fluid distended with scattered air-fluid levels. Diffuse fluid throughout the colon as well with colonic air-fluid levels. Appearance is nonspecific but compatible with ongoing diarrhea. Difficult to exclude mild diffuse enteritis. Normal appendix demonstrated. Scattered minor diverticulosis. No  fluid collection or abscess. No pneumatosis. Vascular/Lymphatic: Atherosclerosis of aorta. Negative for aneurysm or occlusive process. Mesenteric and renal vasculature appear patent. Iliac vessels are patent. No adenopathy. Reproductive: Prostate is unremarkable. Other: No ascites.  Small fat containing umbilical hernia. Musculoskeletal: Degenerative changes of the spine and pelvis. IMPRESSION: Fluid distended, mildly dilated loops jejunum and ileum with scattered air fluid levels extending into the colon compatible with ongoing diarrhea. Difficult to exclude diffuse mild enteritis. Normal appendix No fluid collection or abscess. Mild diverticulosis without acute inflammatory process. Small hiatal hernia Remote cholecystectomy Abdominal atherosclerosis without aneurysm Right nephrolithiasis Electronically Signed   By: Jerilynn Mages.  Shick M.D.   On: 12/13/2017 13:24   Dg Abd 2 Views  Result Date: 12/13/2017 CLINICAL DATA:  Abdominal pain and diarrhea EXAM: ABDOMEN - 2 VIEW COMPARISON:  Abdominal MRI February 01, 2016 FINDINGS: Supine and upright abdomen images obtained. There are loops of mildly dilated small bowel multiple air-fluid levels. There is very little air in the colon. No free air. Lung bases are clear. There is a small phlebolith in left pelvis. There are surgical clips in the gallbladder fossa region. IMPRESSION: Loops of dilated bowel with air-fluid levels. A degree of bowel obstruction is questioned. Differential considerations  include ileus enteritis. No free air. Lung bases clear. There are surgical clips in the gallbladder fossa region. Electronically Signed   By: Lowella Grip III M.D.   On: 12/13/2017 10:29    Procedures Procedures (including critical care time)  Medications Ordered in ED Medications  sodium chloride 0.9 % bolus 1,000 mL (1,000 mLs Intravenous New Bag/Given 12/13/17 0926)     Initial Impression / Assessment and Plan / ED Course  I have reviewed the triage vital signs and the  nursing notes.  Pertinent labs & imaging results that were available during my care of the patient were reviewed by me and considered in my medical decision making (see chart for details).    Feels better after symptomatic management in the emergency department.  CT scan without concerning findings.  Symptoms consistent with enteritis.  Tolerating oral fluids.  Discharged home in good condition.  Home with nausea.  He understands return to the emergency department for new or worsening symptoms.  Final Clinical Impressions(s) / ED Diagnoses   Final diagnoses:  Diarrhea  Abdominal pain    ED Discharge Orders    None       Jola Schmidt, MD 12/14/17 2339

## 2017-12-15 ENCOUNTER — Other Ambulatory Visit (HOSPITAL_COMMUNITY): Payer: 59

## 2017-12-16 MED FILL — FUROSEMIDE 40 MG TAB: 40 | 90 days supply | Qty: 90 | Fill #0

## 2017-12-16 MED FILL — LISINOPRIL 10 MG TABLET: 10 | 90 days supply | Qty: 90 | Fill #0

## 2017-12-16 MED FILL — MELOXICAM 15 MG TABLET: 15 | 90 days supply | Qty: 90 | Fill #0

## 2017-12-16 NOTE — Telephone Encounter (Signed)
I called the [facility] pharmacy and the pharmacist said that he has been getting lisinopril 10mg  once a day since 10/03/16

## 2017-12-16 NOTE — Telephone Encounter (Signed)
Would you please check on this. Thank you

## 2017-12-24 ENCOUNTER — Ambulatory Visit (HOSPITAL_COMMUNITY): Payer: 59 | Attending: Cardiology

## 2017-12-24 ENCOUNTER — Other Ambulatory Visit: Payer: Self-pay

## 2017-12-24 ENCOUNTER — Ambulatory Visit (INDEPENDENT_AMBULATORY_CARE_PROVIDER_SITE_OTHER): Payer: 59

## 2017-12-24 DIAGNOSIS — I493 Ventricular premature depolarization: Secondary | ICD-10-CM | POA: Diagnosis not present

## 2017-12-24 DIAGNOSIS — I10 Essential (primary) hypertension: Secondary | ICD-10-CM | POA: Diagnosis not present

## 2017-12-24 DIAGNOSIS — E119 Type 2 diabetes mellitus without complications: Secondary | ICD-10-CM | POA: Diagnosis not present

## 2017-12-24 DIAGNOSIS — I340 Nonrheumatic mitral (valve) insufficiency: Secondary | ICD-10-CM | POA: Insufficient documentation

## 2017-12-29 ENCOUNTER — Telehealth: Payer: Self-pay | Admitting: *Deleted

## 2017-12-29 MED ORDER — LISINOPRIL 20 MG PO TABS: 20.0000 mg | ORAL_TABLET | Freq: Every day | ORAL | 3 refills | Status: DC

## 2017-12-29 MED ORDER — CARVEDILOL 3.125 MG PO TABS: 3.1250 mg | ORAL_TABLET | Freq: Two times a day (BID) | ORAL | 3 refills | Status: DC

## 2017-12-29 MED FILL — CARVEDILOL 3.125 MG TABLET: 3.125 | 90 days supply | Qty: 180 | Fill #0

## 2017-12-29 NOTE — Telephone Encounter (Signed)
-----   Message from Belva Crome, MD sent at 12/29/2017  7:21 AM EDT ----- Let the patient know the heart is enlarged and mildly weak. Increase lisinopril to 20 mg daily and start carvedilol 3.125 mg BIS. Needs f/u OV to discuss cath. A copy will be sent to Elby Showers, MD

## 2017-12-29 NOTE — Telephone Encounter (Signed)
Spoke with pt and went over results and recommendations per Dr. Tamala Julian.  Pt is leaving to go out of town today so will keep appt for 7/10 to discuss cath.  Sent medications in and pt will pick up before he leaves to go out of town.  Pt appreciative for call.

## 2018-01-05 ENCOUNTER — Encounter: Payer: Self-pay | Admitting: Interventional Cardiology

## 2018-01-13 NOTE — Progress Notes (Signed)
Cardiology Office Note    Date:  01/14/2018   ID:  [name] [name], DOB [birth year], MRN [id number]  PCP:  Elby Showers, MD  Cardiologist: Sinclair Grooms, MD   Chief Complaint  Patient presents with  . Congestive Heart Failure  . Hypertension    History of Present Illness:  [name] [name] is a 67 y.o. male with multiple cardiovascular risk factors and other comorbidities who is referred by Dr. Tommie Ard Baxley for evaluation of premature ventricular contractions.  Abdominal aortic atherosclerosis.  [name] [name] is back today to discuss findings from echocardiogram and recent cardiac evaluation.  We found LV enlargement, concerned about inferior wall hypokinesis, increased free wall thickness on echo, EF of 40%.  LV cavity is dilated.  Overall findings suggest hypertensive heart disease.  The mention of regional wall motion abnormality raises the question of CAD.  He has no symptoms of angina or ischemic equivalent.  He denies dyspnea.  New onset insomnia.  This happened after I made adjustments in his medications.  Those adjustments were the addition of carvedilol 3.125 mg twice daily and up titration of lisinopril to 20 mg from 10 mg/day.   Past Medical History:  Diagnosis Date  . Arthritis   . Diabetes mellitus without complication ([place])   . Hypertension     Past Surgical History:  Procedure Laterality Date  . CHOLECYSTECTOMY N/A 02/04/2016   Procedure: LAPAROSCOPIC CHOLECYSTECTOMY;  Surgeon: Leighton Ruff, MD;  Location: [facility];  Service: General;  Laterality: N/A;  . ERCP N/A 02/02/2016   Procedure: ENDOSCOPIC RETROGRADE CHOLANGIOPANCREATOGRAPHY (ERCP);  Surgeon: [name], MD;  Location: [facility];  Service: Endoscopy;  Laterality: N/A;  . HERNIA REPAIR    . SPINE SURGERY      Current Medications: Outpatient Medications Prior to Visit  Medication Sig Dispense Refill  . amLODipine (NORVASC) 10 MG tablet Take 1 tablet (10 mg total) by mouth daily. 90  tablet 3  . atorvastatin (LIPITOR) 10 MG tablet Take 1 tablet (10 mg total) by mouth daily. 90 tablet 3  . esomeprazole (NEXIUM) 40 MG capsule Take 1 capsule (40 mg total) by mouth daily. 90 capsule 4  . furosemide (LASIX) 40 MG tablet TAKE ONE TABLET BY MOUTH DAILY 90 tablet 3  . lisinopril (PRINIVIL,ZESTRIL) 20 MG tablet Take 1 tablet (20 mg total) by mouth daily. 90 tablet 3  . meloxicam (MOBIC) 15 MG tablet TAKE 1 TABLET BY MOUTH ONCE DAILY 90 tablet 1  . metFORMIN (GLUCOPHAGE-XR) 500 MG 24 hr tablet Take 1 tablet (500 mg total) by mouth daily with breakfast. 90 tablet 4  . carvedilol (COREG) 3.125 MG tablet Take 1 tablet (3.125 mg total) by mouth 2 (two) times daily. 180 tablet 3   No facility-administered medications prior to visit.      Allergies:   Patient has no known allergies.   Social History   Socioeconomic History  . Marital status: Married    Spouse name: Not on file  . Number of children: Not on file  . Years of education: Not on file  . Highest education level: Not on file  Occupational History  . Not on file  Social Needs  . Financial resource strain: Not on file  . Food insecurity:    Worry: Not on file    Inability: Not on file  . Transportation needs:    Medical: Not on file    Non-medical: Not on file  Tobacco Use  . Smoking status:  Never Smoker  . Smokeless tobacco: Never Used  Substance and Sexual Activity  . Alcohol use: No  . Drug use: No  . Sexual activity: Yes  Lifestyle  . Physical activity:    Days per week: Not on file    Minutes per session: Not on file  . Stress: Not on file  Relationships  . Social connections:    Talks on phone: Not on file    Gets together: Not on file    Attends religious service: Not on file    Active member of club or organization: Not on file    Attends meetings of clubs or organizations: Not on file    Relationship status: Not on file  Other Topics Concern  . Not on file  Social History Narrative  . Not  on file     Family History:  The patient's family history includes Hypertension in his mother; Stroke in his mother.   ROS:   Please see the history of present illness.    Anxiety since notification of heart enlargement.  He denies snoring.  Never had any sleep issues.  Feels well rested when he awakens from sleep. All other systems reviewed and are negative.   PHYSICAL EXAM:   VS:  BP (!) 162/96   Pulse (!) 101   Ht 5\' 10"  (1.778 m)   Wt 298 lb 6.4 oz (135.4 kg)   BMI 42.82 kg/m    GEN: Well nourished, well developed, in no acute distress.  Morbidly obese HEENT: normal  Neck: no JVD, carotid bruits, or masses Cardiac: RRR; no murmurs, rubs, or no edema.  Soft S4 gallop is audible. Respiratory:  clear to auscultation bilaterally, normal work of breathing GI: soft, nontender, nondistended, + BS MS: no deformity or atrophy  Skin: warm and dry, no rash Neuro:  Alert and Oriented x 3, Strength and sensation are intact Psych: euthymic mood, full affect  Wt Readings from Last 3 Encounters:  01/14/18 298 lb 6.4 oz (135.4 kg)  12/10/17 (!) 301 lb 6.4 oz (136.7 kg)  10/28/17 300 lb (136.1 kg)      Studies/Labs Reviewed:   EKG:  EKG not repeated  Recent Labs: 09/15/2017: Hemoglobin 13.9; Platelets 243 12/13/2017: ALT 27; BUN 14; Creatinine, Ser 0.97; Potassium 4.3; Sodium 140   Lipid Panel    Component Value Date/Time   CHOL 145 09/15/2017 1037   TRIG 71 09/15/2017 1037   HDL 48 09/15/2017 1037   CHOLHDL 3.0 09/15/2017 1037   VLDL 14 10/18/2016 1048   LDLCALC 82 09/15/2017 1037    Additional studies/ records that were reviewed today include:  2D Doppler echocardiogram 12/24/2017: Study Conclusions   - Left ventricle: The cavity size was severely dilated. Systolic   function was mildly to moderately reduced. The estimated ejection   fraction was in the range of 40% to 45%. There is hypokinesis of   the inferoseptal myocardium. There was no evidence of elevated    ventricular filling pressure by Doppler parameters. - Aortic valve: Trileaflet; mildly thickened, moderately calcified   leaflets. - Mitral valve: There was mild regurgitation. - Left atrium: The atrium was mildly dilated. - Pulmonary arteries: Systolic pressure was mildly increased. PA   peak pressure: 33 mm Hg (S).  Holter monitor 12/24/2017:  Study Highlights      Normal sinus rhythm  PACs and PVCs are noted.  Occasional multiform run of ventricular ectopy/accelerated idioventricular rhythm without associated symptoms. Longest run 9 beats  No atrial fibrillation. Longest  atrial run 3 beats  PVC burden 5%   HEART RATE EPISODES Minimum HR: 50 BPM at 12:50:54 AM Maximum HR: 122 BPM at 9:54:21 PM Average HR: 77 BPM     ASSESSMENT:    1. Chronic combined systolic and diastolic heart failure ([place])   2. PVC's (premature ventricular contractions)   3. Essential hypertension   4. Hyperlipidemia associated with type 2 diabetes mellitus ([place])   5. Abdominal aortic atherosclerosis ([place])   6. Erectile dysfunction, unspecified erectile dysfunction type   7. Type 2 diabetes mellitus with complication, without long-term current use of insulin ([facility])      PLAN:  In order of problems listed above:  1. The echo demonstrated LV enlargement and systolic dysfunction.  He is asymptomatic.  We have started heart failure therapy based upon guideline mandates: ACE inhibitor therapy has been increased to lisinopril 20 mg/day and carvedilol was started at 3.125 mg twice daily (he has developed new insomnia- possibly related to beta-blocker therapy).  Plan today is to further titrate heart failure therapy.  Increase carvedilol to 6.25 mg p.o. twice daily if insomnia gets worse we may need to switch agents.  He will return in 2 to 4 weeks to have beta-blocker therapy further uptitrated to 12.5 mg twice daily.  I will see him back in 2 months.  After full optimization of heart failure therapy, we  will repeat 2D Doppler echocardiogram to see if there is stabilization/improvement in LV function. 2. 5% PVC burden noted on 24-hour Holter.  Did have up to 9 beat runs of ventricular ectopy.  These were asymptomatic.  Average resting heart rate of 77 bpm. 3. Target blood pressure 130/80 mmHg.  If blood pressure still elevated on return, we may need to consider adding low-dose Aldactone.  If blood pressure gets too low on therapy we should sacrifice amlodipine. 4. LDL target should be less than 70. 5. We discussed subclinical coronary and diffuse arterial atherosclerosis.  LDL target less than 70.  Consider aspirin once per day.  Tight control of other risk factors with A1c less than 7, blood pressure 130/80 mmHg, aerobic activity, screening and exclusion of sleep apnea. 6. Clinical correlate of endothelial dysfunction/atherosclerosis.  Patient will return in 2 to 4 weeks and again in 8 weeks for further up titration of therapy.  Will eventually need exclusion of significant coronary artery disease by either noninvasive or invasive means.  Medication Adjustments/Labs and Tests Ordered: Current medicines are reviewed at length with the patient today.  Concerns regarding medicines are outlined above.  Medication changes, Labs and Tests ordered today are listed in the Patient Instructions below. Patient Instructions  Medication Instructions:  1) INCREASE Carvedilol to 6.25mg  twice daily  Labwork: None  Testing/Procedures: None  Follow-Up: Your physician recommends that you schedule a follow-up appointment in: 1 month with a PA or NP.  Your physician recommends that you schedule a follow-up appointment in: 2-3 months with Dr. Tamala Julian.     Any Other Special Instructions Will Be Listed Below (If Applicable).     If you need a refill on your cardiac medications before your next appointment, please call your pharmacy.      Signed, Sinclair Grooms, MD  01/14/2018 11:59 AM    [name] [name], [place], [place]  [address] Phone: [phone]; Fax: [phone]

## 2018-01-14 ENCOUNTER — Ambulatory Visit: Payer: 59 | Admitting: Interventional Cardiology

## 2018-01-14 ENCOUNTER — Encounter (INDEPENDENT_AMBULATORY_CARE_PROVIDER_SITE_OTHER): Payer: Self-pay

## 2018-01-14 ENCOUNTER — Encounter: Payer: Self-pay | Admitting: Interventional Cardiology

## 2018-01-14 VITALS — BP 162/96 | HR 101 | Ht 70.0 in | Wt 298.4 lb

## 2018-01-14 DIAGNOSIS — I700 Atherosclerosis of aorta: Secondary | ICD-10-CM

## 2018-01-14 DIAGNOSIS — E118 Type 2 diabetes mellitus with unspecified complications: Secondary | ICD-10-CM

## 2018-01-14 DIAGNOSIS — I493 Ventricular premature depolarization: Secondary | ICD-10-CM | POA: Diagnosis not present

## 2018-01-14 DIAGNOSIS — E785 Hyperlipidemia, unspecified: Secondary | ICD-10-CM

## 2018-01-14 DIAGNOSIS — N529 Male erectile dysfunction, unspecified: Secondary | ICD-10-CM | POA: Diagnosis not present

## 2018-01-14 DIAGNOSIS — E1169 Type 2 diabetes mellitus with other specified complication: Secondary | ICD-10-CM | POA: Diagnosis not present

## 2018-01-14 DIAGNOSIS — I10 Essential (primary) hypertension: Secondary | ICD-10-CM

## 2018-01-14 DIAGNOSIS — I5042 Chronic combined systolic (congestive) and diastolic (congestive) heart failure: Secondary | ICD-10-CM

## 2018-01-14 MED ORDER — CARVEDILOL 6.25 MG PO TABS: 6.2500 mg | ORAL_TABLET | Freq: Two times a day (BID) | ORAL | 3 refills | Status: DC

## 2018-01-14 MED FILL — CARVEDILOL 6.25 MG TABLET: 6.25 | 30 days supply | Qty: 60 | Fill #0

## 2018-01-14 NOTE — Patient Instructions (Signed)
Medication Instructions:  1) INCREASE Carvedilol to 6.25mg  twice daily  Labwork: None  Testing/Procedures: None  Follow-Up: Your physician recommends that you schedule a follow-up appointment in: 1 month with a PA or NP.  Your physician recommends that you schedule a follow-up appointment in: 2-3 months with Dr. Tamala Julian.     Any Other Special Instructions Will Be Listed Below (If Applicable).     If you need a refill on your cardiac medications before your next appointment, please call your pharmacy.

## 2018-01-15 ENCOUNTER — Ambulatory Visit: Payer: 59 | Admitting: Internal Medicine

## 2018-01-15 ENCOUNTER — Encounter: Payer: Self-pay | Admitting: Internal Medicine

## 2018-01-15 VITALS — BP 130/70 | HR 87 | Temp 98.1°F | Ht 70.0 in | Wt 286.0 lb

## 2018-01-15 DIAGNOSIS — G4709 Other insomnia: Secondary | ICD-10-CM | POA: Diagnosis not present

## 2018-01-15 DIAGNOSIS — J22 Unspecified acute lower respiratory infection: Secondary | ICD-10-CM | POA: Diagnosis not present

## 2018-01-15 MED ORDER — ZOLPIDEM TARTRATE 5 MG PO TABS: 5.0000 mg | ORAL_TABLET | Freq: Every evening | ORAL | 1 refills | Status: DC | PRN

## 2018-01-15 MED ORDER — BENZONATATE 100 MG PO CAPS: 200.0000 mg | ORAL_CAPSULE | Freq: Three times a day (TID) | ORAL | 1 refills | Status: DC | PRN

## 2018-01-15 MED ORDER — AMOXICILLIN 500 MG PO CAPS
500.0000 mg | ORAL_CAPSULE | Freq: Three times a day (TID) | ORAL | 0 refills | Status: DC
Start: 2018-01-15 — End: 2018-03-04

## 2018-01-15 MED FILL — BENZONATATE 100 MG CAPS: 100 | 5 days supply | Qty: 30 | Fill #0

## 2018-01-15 MED FILL — AMOXICILLIN 500 MG CAPSULE: 500 | 10 days supply | Qty: 30 | Fill #0

## 2018-01-15 MED FILL — ZOLPIDEM TARTRATE 5 MG TAB: 5 | 30 days supply | Qty: 30 | Fill #0

## 2018-01-15 NOTE — Patient Instructions (Signed)
Ambien 5 mg at bedtime as needed for insomnia.  Amoxicillin 500 mg 3 times a day for 10 days.  Tessalon Perles 200 mg up to 3 times daily as needed for cough.

## 2018-01-15 NOTE — Progress Notes (Signed)
   Subjective:    Patient ID: [name] [name], male    DOB: 10/03/1950, 67 y.o.   MRN: [id number]  HPI   67 year old Male for URI symptoms 4 days.  His cough and congestion, malaise and fatigue but has continued to work.  Saw Dr. Pernell Dupre recently for cardiology evaluation.  Some continuing issues with insomnia and would like to have his insomnia medication refilled which he takes sparingly.    Review of Systems see above.     Objective:   Physical Exam Skin warm and dry.  Nodes none.  Pharynx is clear.  TMs are clear.  Neck is supple.  Chest clear.       Assessment & Plan:  Insomnia-Ambien 5 mg qhs prn #30 with one refill  Acute Lower respiratory infection--Amoxicillin 500 mg tid x 10 days, Tessalon perles 200 mg tid prn cough  CHF- Rx per Dr. Jeralene Peters. Smith  HTN- Rx per Dr. Jeralene Peters. Tamala Julian

## 2018-01-19 MED FILL — BENZONATATE 100 MG CAPS: 100 | 5 days supply | Qty: 30 | Fill #1

## 2018-02-11 MED FILL — LISINOPRIL 20 MG TABLET: 20 | 90 days supply | Qty: 90 | Fill #0

## 2018-03-02 MED FILL — CYCLOBENZAPRINE 10 MG TAB: 10 | 30 days supply | Qty: 30 | Fill #1

## 2018-03-04 ENCOUNTER — Ambulatory Visit (INDEPENDENT_AMBULATORY_CARE_PROVIDER_SITE_OTHER): Payer: 59 | Admitting: Nurse Practitioner

## 2018-03-04 ENCOUNTER — Encounter: Payer: Self-pay | Admitting: Nurse Practitioner

## 2018-03-04 VITALS — BP 138/90 | HR 86 | Ht 70.0 in | Wt 302.8 lb

## 2018-03-04 DIAGNOSIS — I5042 Chronic combined systolic (congestive) and diastolic (congestive) heart failure: Secondary | ICD-10-CM | POA: Diagnosis not present

## 2018-03-04 DIAGNOSIS — I493 Ventricular premature depolarization: Secondary | ICD-10-CM

## 2018-03-04 DIAGNOSIS — I10 Essential (primary) hypertension: Secondary | ICD-10-CM

## 2018-03-04 DIAGNOSIS — E785 Hyperlipidemia, unspecified: Secondary | ICD-10-CM | POA: Diagnosis not present

## 2018-03-04 DIAGNOSIS — I700 Atherosclerosis of aorta: Secondary | ICD-10-CM

## 2018-03-04 DIAGNOSIS — E1169 Type 2 diabetes mellitus with other specified complication: Secondary | ICD-10-CM

## 2018-03-04 MED ORDER — CARVEDILOL 12.5 MG PO TABS: 12.5000 mg | ORAL_TABLET | Freq: Two times a day (BID) | ORAL | 3 refills | Status: DC

## 2018-03-04 MED FILL — CARVEDILOL 12.5 MG TABLET: 12.5 | 90 days supply | Qty: 180 | Fill #0

## 2018-03-04 NOTE — Progress Notes (Signed)
CARDIOLOGY OFFICE NOTE  Date:  03/04/2018    [name] Date of Birth: 1950/10/28 Medical Record #[id number]  PCP:  Elby Showers, MD  Cardiologist:  Jennings Books   Chief Complaint  Patient presents with  . Congestive Heart Failure  . Hypertension    Follow up visit - seen for Dr. Tamala Julian    History of Present Illness: [name] [name] is a 67 y.o. male who presents today for a follow up visit. Seen for Dr. Tamala Julian.   He has multiple cardiovascular risk factors and other comorbidities who was referred by Dr. Tommie Ard Baxley back in June of this year for evaluation of premature ventricular contractions.  He had noted abdominal aortic atherosclerosis. Echo was obtained - this showed LV enlargement, inferior wall hypokinesis with an EF of 40% and LV dilatation.   Seen last month for recheck - noted new onset of insomnia with the adjustment in his medicines the month prior. It was elected to continue.   Comes in today. Here alone. 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 Notes this is pretty stressful. He feels good. The sleep issue is no more. He wants to consider knee surgery in the future. No chest pain. Breathing is good. Weight is up a few pounds but no swelling. BP running 130/80's at work - typically checks when at work. No awareness of his palpitations. Not dizzy or lightheaded. Overall, he feels like he is well and has no complaints. He is not able to exercise due to knee pain.   Past Medical History:  Diagnosis Date  . Arthritis   . Diabetes mellitus without complication ([place])   . Hypertension     Past Surgical History:  Procedure Laterality Date  . CHOLECYSTECTOMY N/A 02/04/2016   Procedure: LAPAROSCOPIC CHOLECYSTECTOMY;  Surgeon: Leighton Ruff, MD;  Location: [facility];  Service: General;  Laterality: N/A;  . ERCP N/A 02/02/2016   Procedure: ENDOSCOPIC RETROGRADE  CHOLANGIOPANCREATOGRAPHY (ERCP);  Surgeon: [name], MD;  Location: [facility];  Service: Endoscopy;  Laterality: N/A;  . HERNIA REPAIR    . SPINE SURGERY       Medications: Current Meds  Medication Sig  . amLODipine (NORVASC) 10 MG tablet Take 1 tablet (10 mg total) by mouth daily.  [name] atorvastatin (LIPITOR) 10 MG tablet Take 1 tablet (10 mg total) by mouth daily.  [name] esomeprazole (NEXIUM) 40 MG capsule Take 1 capsule (40 mg total) by mouth daily.  . furosemide (LASIX) 40 MG tablet TAKE ONE TABLET BY MOUTH DAILY  . lisinopril (PRINIVIL,ZESTRIL) 20 MG tablet Take 1 tablet (20 mg total) by mouth daily.  . meloxicam (MOBIC) 15 MG tablet TAKE 1 TABLET BY MOUTH ONCE DAILY  . metFORMIN (GLUCOPHAGE-XR) 500 MG 24 hr tablet Take 1 tablet (500 mg total) by mouth daily with breakfast.  . zolpidem (AMBIEN) 5 MG tablet Take 1 tablet (5 mg total) by mouth at bedtime as needed for sleep.  . [DISCONTINUED] amoxicillin (AMOXIL) 500 MG capsule Take 1 capsule (500 mg total) by mouth 3 (three) times daily.  . [DISCONTINUED] benzonatate (TESSALON) 100 MG capsule Take 2 capsules (200 mg total) by mouth 3 (three) times daily as needed for cough.  . [DISCONTINUED] carvedilol (COREG) 6.25 MG tablet Take 1 tablet (6.25 mg total) by mouth 2 (two) times daily.     Allergies: No Known Allergies  Social  History: The patient  reports that he has never smoked. He has never used smokeless tobacco. He reports that he does not drink alcohol or use drugs.   Family History: The patient's family history includes Hypertension in his mother; Stroke in his mother.   Review of Systems: Please see the history of present illness.   Otherwise, the review of systems is positive for none.   All other systems are reviewed and negative.   Physical Exam: VS:  BP 138/90 (BP Location: Left Arm, Patient Position: Sitting, Cuff Size: Large)   Pulse 86   Ht 5\' 10"  (1.778 m)   Wt (!) 302 lb 12.8 oz (137.3 kg)   SpO2 98%  Comment: at rest  BMI 43.45 kg/m  .  BMI Body mass index is 43.45 kg/m.  Wt Readings from Last 3 Encounters:  03/04/18 (!) 302 lb 12.8 oz (137.3 kg)  01/15/18 286 lb (129.7 kg)  01/14/18 298 lb 6.4 oz (135.4 kg)    General: Pleasant. Obese. Large stature. Alert and in no acute distress.   HEENT: Normal.  Neck: Supple, no JVD, carotid bruits, or masses noted.  Cardiac: Regular rate and rhythm. Occasional ectopic noted. No edema.  Respiratory:  Lungs are clear to auscultation bilaterally with normal work of breathing.  GI: Soft and nontender.  MS: No deformity or atrophy. Gait and ROM intact.  Skin: Warm and dry. Color is normal.  Neuro:  Strength and sensation are intact and no gross focal deficits noted.  Psych: Alert, appropriate and with normal affect.   LABORATORY DATA:  EKG:  EKG is not ordered today.  Lab Results  Component Value Date   WBC 4.9 09/15/2017   HGB 13.9 09/15/2017   HCT 41.9 09/15/2017   PLT 243 09/15/2017   GLUCOSE 119 (H) 12/13/2017   CHOL 145 09/15/2017   TRIG 71 09/15/2017   HDL 48 09/15/2017   LDLCALC 82 09/15/2017   ALT 27 12/13/2017   AST 22 12/13/2017   NA 140 12/13/2017   K 4.3 12/13/2017   CL 103 12/13/2017   CREATININE 0.97 12/13/2017   BUN 14 12/13/2017   CO2 26 12/13/2017   PSA 0.6 09/15/2017   HGBA1C 6.4 (H) 09/15/2017   MICROALBUR 0.9 09/15/2017     BNP (last 3 results) No results for input(s): BNP in the last 8760 hours.  ProBNP (last 3 results) No results for input(s): PROBNP in the last 8760 hours.   Other Studies Reviewed Today:  2D Doppler echocardiogram 12/24/2017: Study Conclusions  - Left ventricle: The cavity size was severely dilated. Systolic function was mildly to moderately reduced. The estimated ejection fraction was in the range of 40% to 45%. There is hypokinesis of the inferoseptal myocardium. There was no evidence of elevated ventricular filling pressure by Doppler parameters. - Aortic  valve: Trileaflet; mildly thickened, moderately calcified leaflets. - Mitral valve: There was mild regurgitation. - Left atrium: The atrium was mildly dilated. - Pulmonary arteries: Systolic pressure was mildly increased. PA peak pressure: 33 mm Hg (S).  Holter monitor 12/24/2017:  Holter Study Highlights 12/2017    Normal sinus rhythm  PACs and PVCs are noted.  Occasional multiform run of ventricular ectopy/accelerated idioventricular rhythm without associated symptoms. Longest run 9 beats  No atrial fibrillation. Longest atrial run 3 beats  PVC burden 5%  HEART RATE EPISODES Minimum HR: 50 BPM at 12:50:54 AM Maximum HR: 122 BPM at 9:54:21 PM Average HR: 77 BPM   Assessment/Plan:  1. Chronic combined systolic  and diastolic HF - doing well clinically. NYHA I/II. He is on ACE and beta blocker along with diuretic. Increasing Coreg to 12.5 mg BID. Reminded about salt restriction. He will need evaluation of his coronary status at some point - he does not wish to have cardiac cath and prefers non invasive approach - may also be entertaining knee surgery - will go ahead and arrange coronary CT. He is to have a repeat echo after full optimization of his CHF regimen. He is felt to be doing quite well at this time.   2. HTN - BP improving - increasing Coreg today.   3. PVC's - see above.   4. Abdominal atherosclerosis - needs risk factor modification.  5. ED - not discussed  6. DM - per PCP  7. Morbid obesity - weight loss/salt restriction/regular exercise encouraged.    Current medicines are reviewed with the patient today.  The patient does not have concerns regarding medicines other than what has been noted above.  The following changes have been made:  See above.  Labs/ tests ordered today include:   No orders of the defined types were placed in this encounter.    Disposition:   FU with Dr. Tamala Julian as planned in October. I am happy to see back as needed.    Patient is agreeable to this plan and will call if any problems develop in the interim.   SignedTruitt Merle, NP  03/04/2018 10:14 AM  [facility] [address] [place], [place]  [address] Phone: [phone] Fax: [phone]

## 2018-03-04 NOTE — Patient Instructions (Addendum)
We will be checking the following labs today - NONE   Medication Instructions:    Continue with your current medicines. BUT  I am increasing the Coreg to 12.5 mg to take twice a day - you may take 2 of the 6.25 mg tablets twice a day and use up - the RX for the 12.5 mg is at the pharmacy.     Testing/Procedures To Be Arranged:  Coronary CT   Follow-Up:   See Dr.Smith in October as planned.     Other Special Instructions:  Please arrive at the [facility] main entrance of [facility] at __________________________________________ AM (30-45 minutes prior to test start time)  [facility] [address] [place], [place] [address] [phone]  Proceed to the [facility] Radiology Department (First Floor).  Please follow these instructions carefully (unless otherwise directed):  Hold all erectile dysfunction medications at least 48 hours prior to test.  On the Night Before the Test: Drink plenty of water. Do not consume any caffeinated/decaffeinated beverages or chocolate 12 hours prior to your test. Do not take any antihistamines 12 hours prior to your test. If you take Metformin do not take 24 hours prior to test.   On the Day of the Test: Drink plenty of water. Do not drink any water within one hour of the test. Do not eat any food 4 hours prior to the test. You may take your regular medications prior to the test.  HOLD Furosemide morning of the test.  After the Test: Drink plenty of water. After receiving IV contrast, you may experience a mild flushed feeling. This is normal. On occasion, you may experience a mild rash up to 24 hours after the test. This is not dangerous. If this occurs, you can take Benadryl 25 mg and increase your fluid intake. If you experience trouble breathing, this can be serious. If it is severe call 911 IMMEDIATELY. If it is mild, please call our office. If you take any of these medications: Glipizide/Metformin,  Avandament, Glucavance, please do not take 48 hours after completing test.      If you need a refill on your cardiac medications before your next appointment, please call your pharmacy.   Call the [place] office at [phone] if you have any questions, problems or concerns.

## 2018-03-18 MED FILL — SILDENAFIL CITRATE 100 MG T: 100 | 90 days supply | Qty: 18 | Fill #3

## 2018-03-18 MED FILL — ZOLPIDEM TARTRATE 5 MG TAB: 5 | 30 days supply | Qty: 30 | Fill #1

## 2018-03-18 MED FILL — AMLODIPINE BESYLATE 10 MG T: 10 | 90 days supply | Qty: 90 | Fill #3

## 2018-03-18 MED FILL — ESOMEPRAZOLE MAG DR 40 MG C: 40 | 90 days supply | Qty: 90 | Fill #3

## 2018-03-18 MED FILL — FUROSEMIDE 40 MG TAB: 40 | 90 days supply | Qty: 90 | Fill #1

## 2018-03-18 MED FILL — MELOXICAM 15 MG TABLET: 15 | 90 days supply | Qty: 90 | Fill #1

## 2018-03-18 MED FILL — ATORVASTATIN 10 MG TABLET: 10 | 90 days supply | Qty: 90 | Fill #3

## 2018-03-18 MED FILL — LISINOPRIL 10 MG TABLET: 10 | 90 days supply | Qty: 90 | Fill #1

## 2018-03-18 MED FILL — METFORMIN HCL ER 500 MG TAB: 500 | 90 days supply | Qty: 90 | Fill #3

## 2018-03-20 ENCOUNTER — Other Ambulatory Visit: Payer: Self-pay | Admitting: Internal Medicine

## 2018-03-20 DIAGNOSIS — Z5181 Encounter for therapeutic drug level monitoring: Secondary | ICD-10-CM

## 2018-03-20 DIAGNOSIS — E7800 Pure hypercholesterolemia, unspecified: Secondary | ICD-10-CM

## 2018-03-20 DIAGNOSIS — E119 Type 2 diabetes mellitus without complications: Secondary | ICD-10-CM

## 2018-03-20 DIAGNOSIS — Z79899 Other long term (current) drug therapy: Secondary | ICD-10-CM

## 2018-03-23 ENCOUNTER — Other Ambulatory Visit: Payer: 59 | Admitting: Internal Medicine

## 2018-03-23 DIAGNOSIS — E119 Type 2 diabetes mellitus without complications: Secondary | ICD-10-CM | POA: Diagnosis not present

## 2018-03-23 DIAGNOSIS — Z5181 Encounter for therapeutic drug level monitoring: Secondary | ICD-10-CM | POA: Diagnosis not present

## 2018-03-23 DIAGNOSIS — Z79899 Other long term (current) drug therapy: Secondary | ICD-10-CM

## 2018-03-23 DIAGNOSIS — E7800 Pure hypercholesterolemia, unspecified: Secondary | ICD-10-CM

## 2018-03-24 LAB — LIPID PANEL
CHOLESTEROL: 148 mg/dL (ref <200)
HDL: 52 mg/dL (ref >40)
LDL CHOLESTEROL (CALC): 80 mg/dL
Non-HDL Cholesterol (Calc): 96 mg/dL (calc) (ref <130)
TRIGLYCERIDES: 81 mg/dL (ref <150)
Total CHOL/HDL Ratio: 2.8 (calc) (ref <5.0)

## 2018-03-24 LAB — MICROALBUMIN / CREATININE URINE RATIO
Creatinine, Urine: 172 mg/dL (ref 20–320)
Microalb Creat Ratio: 10 mcg/mg creat (ref <30)
Microalb, Ur: 1.8 mg/dL

## 2018-03-24 LAB — HEMOGLOBIN A1C
Hgb A1c MFr Bld: 6.4 % of total Hgb — ABNORMAL HIGH (ref <5.7)
Mean Plasma Glucose: 137 (calc)
eAG (mmol/L): 7.6 (calc)

## 2018-03-27 ENCOUNTER — Ambulatory Visit (INDEPENDENT_AMBULATORY_CARE_PROVIDER_SITE_OTHER): Payer: 59 | Admitting: Internal Medicine

## 2018-03-27 ENCOUNTER — Encounter: Payer: Self-pay | Admitting: Internal Medicine

## 2018-03-27 VITALS — BP 140/90 | HR 72 | Temp 98.2°F | Ht 70.0 in | Wt 297.0 lb

## 2018-03-27 DIAGNOSIS — I499 Cardiac arrhythmia, unspecified: Secondary | ICD-10-CM

## 2018-03-27 DIAGNOSIS — E119 Type 2 diabetes mellitus without complications: Secondary | ICD-10-CM

## 2018-03-27 DIAGNOSIS — G4709 Other insomnia: Secondary | ICD-10-CM | POA: Diagnosis not present

## 2018-03-27 DIAGNOSIS — I5042 Chronic combined systolic (congestive) and diastolic (congestive) heart failure: Secondary | ICD-10-CM

## 2018-03-27 DIAGNOSIS — E7800 Pure hypercholesterolemia, unspecified: Secondary | ICD-10-CM

## 2018-03-27 DIAGNOSIS — I700 Atherosclerosis of aorta: Secondary | ICD-10-CM

## 2018-03-27 DIAGNOSIS — M170 Bilateral primary osteoarthritis of knee: Secondary | ICD-10-CM

## 2018-03-27 DIAGNOSIS — K219 Gastro-esophageal reflux disease without esophagitis: Secondary | ICD-10-CM | POA: Diagnosis not present

## 2018-03-27 DIAGNOSIS — Z6841 Body Mass Index (BMI) 40.0 and over, adult: Secondary | ICD-10-CM

## 2018-03-27 DIAGNOSIS — I10 Essential (primary) hypertension: Secondary | ICD-10-CM

## 2018-03-27 NOTE — Progress Notes (Signed)
   Subjective:    Patient ID: [name] [name], male    DOB: 02-22-1951, 67 y.o.   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  He has to walk a long ways from the parking lot now.  This is bothering his knees.  He is thinking of going part-time in the next few months.    Followed by Cardiology for dysrhythmia with PACs and PVCs which we noted on exam here not long ago.  Holter study showed occasional multiform run of ventricular ectopy with longest run 9 beats.  No symptoms.  Dr. Pernell Dupre saw him.  Patient was diagnosed with chronic combined systolic and diastolic heart failure.  He is on ACE inhibitor, beta-blocker, and diuretic.  Recently Coreg was increased to 12.5 mg twice daily.  Patient prefers not to have cardiac cath.  A coronary CT is planned.  He also has been noted to have abdominal atherosclerosis without aneurysm.  Continues to have some issues with insomnia and Ambien will be prescribed at his request.  Wife had knee replacement recently and is doing well.  Hemoglobin A1c recently was 6.4% with normal cholesterol.  Review of Systems  Constitutional: Positive for fatigue.  Respiratory: Negative.  Negative for shortness of breath.   Cardiovascular: Negative.   Psychiatric/Behavioral:       Insomnia       Objective:   Physical Exam  Constitutional: He is oriented to person, place, and time. He appears well-developed and well-nourished. No distress.  HENT:  Head: Normocephalic.  Neck: Neck supple. No JVD present.  Cardiovascular: Normal rate, regular rhythm and normal heart sounds.  No murmur heard. Pulmonary/Chest: Effort normal and breath sounds normal. No respiratory distress. He has no wheezes. He has no rales.  Musculoskeletal: He  exhibits no edema.  Neurological: He is alert and oriented to person, place, and time.  Skin: He is not diaphoretic.  Vitals reviewed.         Assessment & Plan:  Essential hypertension-stable on current regimen  Insomnia-may take Ambien sparingly at bedtime  Hyperlipidemia-treated with Lipitor  Abdominal atherosclerosis  Controlled type 2 diabetes mellitus treated with metformin  BMI 42.62-encourage diet exercise weight loss  Combined systolic and diastolic heart failure treated with lisinopril, Coreg, amlodipine, Lasix  GE reflux treated with Nexium  Osteoarthritis of knees  Plan: He remains on meloxicam 15 mg daily but does not have significant dependent edema.  I prescribed Ambien at his request for insomnia.  Otherwise continue same medications.  Has appointment to see Dr. Tamala Julian in October.  Physical exam due here March 2020.

## 2018-04-06 ENCOUNTER — Telehealth: Payer: Self-pay | Admitting: Internal Medicine

## 2018-04-06 MED ORDER — ZOLPIDEM TARTRATE 5 MG PO TABS: 5.0000 mg | ORAL_TABLET | Freq: Every evening | ORAL | 1 refills | Status: DC | PRN

## 2018-04-06 NOTE — Telephone Encounter (Signed)
Refill Ambien for sleep at pt request. Work is stressful.

## 2018-04-06 NOTE — Patient Instructions (Addendum)
It was a pleasure to see you today.  Continue to work on diet exercise and weight loss.  Continue same medications and follow-up in March 2020.  Continue follow-up with Cardiology.  Ambien prescribed for insomnia.

## 2018-04-20 MED FILL — ZOLPIDEM TARTRATE 5 MG TAB: 5 | 90 days supply | Qty: 90 | Fill #0

## 2018-04-21 NOTE — Progress Notes (Signed)
Cardiology Office Note:    Date:  04/22/2018   ID:  [name] [name], DOB [birth year], MRN [id number]  PCP:  Elby Showers, MD  Cardiologist:  No primary care provider on file.   Referring MD: Elby Showers, MD   Chief Complaint  Patient presents with  . Congestive Heart Failure    History of Present Illness:    [name] [name] is a 67 y.o. male with a hx of aortic atherosclerosis, type 2 diabetes, hypertension, and chronic combined systolic and diastolic heart failure.  Past Medical History:  Diagnosis Date  . Arthritis   . Diabetes mellitus without complication ([place])   . Hypertension     Past Surgical History:  Procedure Laterality Date  . CHOLECYSTECTOMY N/A 02/04/2016   Procedure: LAPAROSCOPIC CHOLECYSTECTOMY;  Surgeon: Leighton Ruff, MD;  Location: [facility];  Service: General;  Laterality: N/A;  . ERCP N/A 02/02/2016   Procedure: ENDOSCOPIC RETROGRADE CHOLANGIOPANCREATOGRAPHY (ERCP);  Surgeon: [name], MD;  Location: [facility];  Service: Endoscopy;  Laterality: N/A;  . HERNIA REPAIR    . SPINE SURGERY      Current Medications: Current Meds  Medication Sig  . amLODipine (NORVASC) 10 MG tablet Take 1 tablet (10 mg total) by mouth daily.  [name] atorvastatin (LIPITOR) 10 MG tablet Take 1 tablet (10 mg total) by mouth daily.  . carvedilol (COREG) 12.5 MG tablet Take 1 tablet (12.5 mg total) by mouth 2 (two) times daily.  [name] esomeprazole (NEXIUM) 40 MG capsule Take 1 capsule (40 mg total) by mouth daily.  . furosemide (LASIX) 40 MG tablet TAKE ONE TABLET BY MOUTH DAILY  . meloxicam (MOBIC) 15 MG tablet TAKE 1 TABLET BY MOUTH ONCE DAILY  . metFORMIN (GLUCOPHAGE-XR) 500 MG 24 hr tablet Take 1 tablet (500 mg total) by mouth daily with breakfast.  . sildenafil (VIAGRA) 100 MG tablet Take 100 mg by mouth daily as needed for erectile dysfunction.  [name] zolpidem (AMBIEN) 5 MG tablet Take 1 tablet (5 mg total) by mouth at bedtime as needed for sleep.     Allergies:    Patient has no known allergies.   Social History   Socioeconomic History  . Marital status: Married    Spouse name: Not on file  . Number of children: Not on file  . Years of education: Not on file  . Highest education level: Not on file  Occupational History  . Not on file  Social Needs  . Financial resource strain: Not on file  . Food insecurity:    Worry: Not on file    Inability: Not on file  . Transportation needs:    Medical: Not on file    Non-medical: Not on file  Tobacco Use  . Smoking status: Never Smoker  . Smokeless tobacco: Never Used  Substance and Sexual Activity  . Alcohol use: No  . Drug use: No  . Sexual activity: Yes  Lifestyle  . Physical activity:    Days per week: Not on file    Minutes per session: Not on file  . Stress: Not on file  Relationships  . Social connections:    Talks on phone: Not on file    Gets together: Not on file    Attends religious service: Not on file    Active member of club or organization: Not on file    Attends meetings of clubs or organizations: Not on file    Relationship status: Not on file  Other Topics Concern  .  Not on file  Social History Narrative  . Not on file     Family History: The patient's family history includes Hypertension in his mother; Stroke in his mother.  ROS:   Please see the history of present illness.    No medication side effects.  Bilateral knee discomfort and looking forward to knee replacement surgery by Dr. Niel Hummer.  All other systems reviewed and are negative.  EKGs/Labs/Other Studies Reviewed:    The following studies were reviewed today:  June 2000 nineteen 24-hour Holter monitor: Study Highlights      Normal sinus rhythm  PACs and PVCs are noted.  Occasional multiform run of ventricular ectopy/accelerated idioventricular rhythm without associated symptoms. Longest run 9 beats  No atrial fibrillation. Longest atrial run 3 beats  PVC burden 5%   HEART RATE  EPISODES Minimum HR: 50 BPM at 12:50:54 AM Maximum HR: 122 BPM at 9:54:21 PM Average HR: 77 BPM     EKG:  EKG is not ordered today.    Recent Labs: 09/15/2017: Hemoglobin 13.9; Platelets 243 12/13/2017: ALT 27; BUN 14; Creatinine, Ser 0.97; Potassium 4.3; Sodium 140  Recent Lipid Panel    Component Value Date/Time   CHOL 148 03/23/2018 0942   TRIG 81 03/23/2018 0942   HDL 52 03/23/2018 0942   CHOLHDL 2.8 03/23/2018 0942   VLDL 14 10/18/2016 1048   LDLCALC 80 03/23/2018 0942    Physical Exam:    VS:  BP (!) 174/102   Pulse 77   Ht 5\' 11"  (1.803 m)   Wt (!) 300 lb 12.8 oz (136.4 kg)   BMI 41.95 kg/m     Wt Readings from Last 3 Encounters:  04/22/18 (!) 300 lb 12.8 oz (136.4 kg)  03/27/18 297 lb (134.7 kg)  03/04/18 (!) 302 lb 12.8 oz (137.3 kg)     GEN: Obese, well developed in no acute distress HEENT: Normal NECK: No JVD. LYMPHATICS: No lymphadenopathy CARDIAC: RRR, no murmur, no gallop, no edema. VASCULAR: 2+ bilateral radial and carotid pulses.  No bruits. RESPIRATORY:  Clear to auscultation without rales, wheezing or rhonchi  ABDOMEN: Soft, non-tender, non-distended, No pulsatile mass, MUSCULOSKELETAL: No deformity  SKIN: Warm and dry NEUROLOGIC:  Alert and oriented x 3 PSYCHIATRIC:  Normal affect   ASSESSMENT:    1. Chronic combined systolic and diastolic heart failure ([place])   2. Essential hypertension   3. Hyperlipidemia associated with type 2 diabetes mellitus ([place])   4. PVC's (premature ventricular contractions)   5. Type 2 diabetes mellitus with complication, without long-term current use of insulin ([facility])   6. Abdominal aortic atherosclerosis ([facility])    PLAN:    In order of problems listed above:  1. Systolic dysfunction etiology still unclear.  Assumption is hypertension related.  He does have increased free wall thickness.  LV is dilated.  Other considerations would include sarcoid, idiopathic cardiomyopathy, and possibly infiltrative process.  May  ultimately need to exclude ischemic injury with a magnetic resonance image of the heart to rule out late gadolinium uptake in a pattern suggesting prior infarction which would then suggest CAD in this patient with multiple risk factors.  Plan to do an echocardiogram late this year early January. 2. Blood pressure is elevated today.  Increase carvedilol to 25 mg twice daily.  Next step will be 40 mg of lisinopril daily. 3. LDL target is less than 70. 4. PVC burden noted to be less than 5%. 5. Hemoglobin A1c target is less than 7.  Late  December early January echo to reassess LV function on guideline determine therapy for systolic dysfunction.  If EF is still low, he will need further evaluation including an ischemic rule out which could be done with MRI with late gadolinium scanning versus coronary CTA.  He is reluctant to consider coronary angiography.  He is asymptomatic with reference to angina and therefore there is no reason to push invasive evaluation.  Plan see the patient in 3 to 4 months.  Record blood pressures at work to help our decision making about further titrating ACE inhibitor therapy.  Greater than 50% of this time was spent in coordination of care and education.   Significant discussion concerning left ventricular systolic dysfunction.  He is asymptomatic.  Probably related to hypertension.  He is reluctant to have ischemic evaluation.  He is diabetic.  Optimize heart failure therapy, on this step increasing carvedilol to 25 mg twice daily.  Next step will be increasing lisinopril to 40 mg/day.  Echocardiogram will be repeated at the end of December.  I will see him in January.  Once blood pressure and heart failure therapy are optimized, he will need to have some type of ischemic evaluation performed.  Medication Adjustments/Labs and Tests Ordered: Current medicines are reviewed at length with the patient today.  Concerns regarding medicines are outlined above.  No orders of the  defined types were placed in this encounter.  No orders of the defined types were placed in this encounter.   There are no Patient Instructions on file for this visit.   Signed, Sinclair Grooms, MD  04/22/2018 10:32 AM    [place]

## 2018-04-22 ENCOUNTER — Encounter: Payer: Self-pay | Admitting: Interventional Cardiology

## 2018-04-22 ENCOUNTER — Ambulatory Visit: Payer: 59 | Admitting: Interventional Cardiology

## 2018-04-22 ENCOUNTER — Encounter (INDEPENDENT_AMBULATORY_CARE_PROVIDER_SITE_OTHER): Payer: Self-pay

## 2018-04-22 VITALS — BP 174/102 | HR 77 | Ht 71.0 in | Wt 300.8 lb

## 2018-04-22 DIAGNOSIS — I5042 Chronic combined systolic (congestive) and diastolic (congestive) heart failure: Secondary | ICD-10-CM | POA: Diagnosis not present

## 2018-04-22 DIAGNOSIS — I493 Ventricular premature depolarization: Secondary | ICD-10-CM

## 2018-04-22 DIAGNOSIS — E785 Hyperlipidemia, unspecified: Secondary | ICD-10-CM

## 2018-04-22 DIAGNOSIS — I700 Atherosclerosis of aorta: Secondary | ICD-10-CM

## 2018-04-22 DIAGNOSIS — E1169 Type 2 diabetes mellitus with other specified complication: Secondary | ICD-10-CM | POA: Diagnosis not present

## 2018-04-22 DIAGNOSIS — I10 Essential (primary) hypertension: Secondary | ICD-10-CM | POA: Diagnosis not present

## 2018-04-22 DIAGNOSIS — E118 Type 2 diabetes mellitus with unspecified complications: Secondary | ICD-10-CM

## 2018-04-22 MED ORDER — CARVEDILOL 25 MG PO TABS: 25.0000 mg | ORAL_TABLET | Freq: Two times a day (BID) | ORAL | 3 refills | Status: DC

## 2018-04-22 MED FILL — CARVEDILOL 25 MG TABLET: 25 | 90 days supply | Qty: 180 | Fill #0

## 2018-04-22 NOTE — Patient Instructions (Signed)
Your physician has recommended you make the following change in your medication:  INCREASE CARVEDILOL TO 25 MG TWICE DAILY AND MAKE SURE IS TAKING LISINOPRIL 20 MG   FOR 2 WEEKS CHECK B/P 2 HOURS AFTER MEDICINE IS TAKEN  AND CALL DR Hitchcock READINGS    Your physician has requested that you have an echocardiogram. Echocardiography is a painless test that uses sound waves to create images of your heart. It provides your doctor with information about the size and shape of your heart and how well your heart's chambers and valves are working. This procedure takes approximately one hour. There are no restrictions for this procedure.   Your physician recommends that you schedule a follow-up appointment in: January WITH DR Tamala Julian

## 2018-05-12 ENCOUNTER — Other Ambulatory Visit: Payer: Self-pay | Admitting: Internal Medicine

## 2018-05-12 MED ORDER — METFORMIN HCL ER 500 MG PO TB24: 500.0000 mg | ORAL_TABLET | Freq: Two times a day (BID) | ORAL | 3 refills | Status: DC

## 2018-05-12 NOTE — Telephone Encounter (Signed)
We increased patient's Metformin from 1 a day to 1 bid.  For this reason, he has ran out of his Rx and we need to send a new Rx to his pharmacy with the new updated instructions.    Can we please do that to his pharmacy: Metformin 500mg  1 po bid  Pharmacy:  [facility]  Thank you.

## 2018-05-12 NOTE — Telephone Encounter (Signed)
Please change dose and give Rx for 90 days with 3 refills

## 2018-05-14 MED FILL — metFORMIN HCL ER 500 MG TB2: 500 | 90 days supply | Qty: 180 | Fill #0

## 2018-05-19 ENCOUNTER — Telehealth: Payer: Self-pay | Admitting: Interventional Cardiology

## 2018-05-19 NOTE — Telephone Encounter (Signed)
Pt seen 10/16 and told to make sure he was taking Lisinopril 20mg  QD and Coreg was increased to 25mg  BID.  Pt was told to monitor BP x 2 weeks and call with readings.  These are the readings pt had.  Will route to Dr. Tamala Julian to review.

## 2018-05-19 NOTE — Telephone Encounter (Signed)
° °  Pt c/o BP issue: STAT if pt c/o blurred vision, one-sided weakness or slurred speech  1. What are your last 5 BP readings? 140/82,130/72,136/84  2. Are you having any other symptoms (ex. Dizziness, headache, blurred vision, passed out)? NO  3. What is your BP issue? No issue, just reporting BP

## 2018-05-21 NOTE — Telephone Encounter (Signed)
Thanks. Improved. No new changes needed. Continue low salt diet.

## 2018-05-25 NOTE — Telephone Encounter (Signed)
Spoke with pt and made him aware of Dr. Thompson Caul recommendations.  Pt appreciative for call.

## 2018-05-27 DIAGNOSIS — M25562 Pain in left knee: Secondary | ICD-10-CM | POA: Diagnosis not present

## 2018-05-27 DIAGNOSIS — M1711 Unilateral primary osteoarthritis, right knee: Secondary | ICD-10-CM | POA: Diagnosis not present

## 2018-05-27 DIAGNOSIS — M25561 Pain in right knee: Secondary | ICD-10-CM | POA: Diagnosis not present

## 2018-05-27 DIAGNOSIS — M1712 Unilateral primary osteoarthritis, left knee: Secondary | ICD-10-CM | POA: Diagnosis not present

## 2018-06-15 MED FILL — AMLODIPINE BESYLATE 10 MG T: 10 | 90 days supply | Qty: 90 | Fill #4

## 2018-06-15 MED FILL — SILDENAFIL CITRATE 100 MG T: 100 | 90 days supply | Qty: 18 | Fill #4

## 2018-06-15 MED FILL — ESOMEPRAZOLE MAG DR 40 MG C: 40 | 90 days supply | Qty: 90 | Fill #4

## 2018-06-15 MED FILL — LISINOPRIL 10 MG TABS: 10 | 90 days supply | Qty: 90 | Fill #2

## 2018-06-15 MED FILL — FUROSEMIDE 40 MG TAB: 40 | 90 days supply | Qty: 90 | Fill #2

## 2018-06-17 ENCOUNTER — Other Ambulatory Visit: Payer: Self-pay | Admitting: Internal Medicine

## 2018-06-17 MED FILL — ATORVASTATIN 10 MG TABLET: 10 | 90 days supply | Qty: 90 | Fill #0

## 2018-06-17 MED FILL — MELOXICAM 15 MG TABLET: 15 | 90 days supply | Qty: 90 | Fill #0

## 2018-06-26 DIAGNOSIS — M1712 Unilateral primary osteoarthritis, left knee: Secondary | ICD-10-CM | POA: Diagnosis not present

## 2018-06-26 DIAGNOSIS — M25561 Pain in right knee: Secondary | ICD-10-CM | POA: Diagnosis not present

## 2018-06-26 DIAGNOSIS — M25562 Pain in left knee: Secondary | ICD-10-CM | POA: Diagnosis not present

## 2018-06-26 DIAGNOSIS — M1711 Unilateral primary osteoarthritis, right knee: Secondary | ICD-10-CM | POA: Diagnosis not present

## 2018-07-13 ENCOUNTER — Other Ambulatory Visit: Payer: Self-pay | Admitting: Orthopaedic Surgery

## 2018-07-13 ENCOUNTER — Ambulatory Visit (HOSPITAL_COMMUNITY): Payer: 59 | Attending: Cardiology

## 2018-07-13 ENCOUNTER — Other Ambulatory Visit: Payer: Self-pay

## 2018-07-13 DIAGNOSIS — I5042 Chronic combined systolic (congestive) and diastolic (congestive) heart failure: Secondary | ICD-10-CM | POA: Diagnosis not present

## 2018-07-13 NOTE — Pre-Procedure Instructions (Signed)
[name] [name]  07/13/2018      [place], [place] - [address] [facility]. [address] [place] [address] Phone: [phone] Fax: [phone]    Your procedure is scheduled on July 28, 2017.  Report to [facility] Admitting at 530 AM.  Call this number if you have problems the morning of surgery:  [phone]   Remember:  Do not eat or drink after midnight.   Take these medicines the morning of surgery with A SIP OF WATER  Carvedilol (coreg) Amlodipine (norvasc) Esomeprazole (Nexium)  7 days prior to surgery STOP taking any meloxicam (mobic), Aspirin (unless otherwise instructed by your surgeon), Aleve, Naproxen, Ibuprofen, Motrin, Advil, Goody's, BC's, all herbal medications, fish oil, and all vitamins  WHAT DO I DO ABOUT MY DIABETES MEDICATION?  [name] Do not take oral diabetes medicines (pills) the morning of surgery-metformin (glucophage).  Reviewed and Endorsed by [facility] Patient Education Committee, August 2015    How to Manage Your Diabetes Before and After Surgery  Why is it important to control my blood sugar before and after surgery? . Improving blood sugar levels before and after surgery helps healing and can limit problems. . A way of improving blood sugar control is eating a healthy diet by: o  Eating less sugar and carbohydrates o  Increasing activity/exercise o  Talking with your doctor about reaching your blood sugar goals . High blood sugars (greater than 180 mg/dL) can raise your risk of infections and slow your recovery, so you will need to focus on controlling your diabetes during the weeks before surgery. . Make sure that the doctor who takes care of your diabetes knows about your planned surgery including the date and location.  How do I manage my blood sugar before surgery? . Check your blood sugar at least 4 times a day, starting 2 days before surgery, to make sure that the level is  not too high or low. o Check your blood sugar the morning of your surgery when you wake up and every 2 hours until you get to the Short Stay unit. . If your blood sugar is less than 70 mg/dL, you will need to treat for low blood sugar: o Do not take insulin. o Treat a low blood sugar (less than 70 mg/dL) with  cup of clear juice (cranberry or apple), 4 glucose tablets, OR glucose gel. Recheck blood sugar in 15 minutes after treatment (to make sure it is greater than 70 mg/dL). If your blood sugar is not greater than 70 mg/dL on recheck, call [phone] o  for further instructions. . Report your blood sugar to the short stay nurse when you get to Short Stay.  . If you are admitted to the hospital after surgery: o Your blood sugar will be checked by the staff and you will probably be given insulin after surgery (instead of oral diabetes medicines) to make sure you have good blood sugar levels. o The goal for blood sugar control after surgery is 80-180 mg/dL.  [facility] [place]- Preparing For Surgery  Before surgery, you can play an important role. Because skin is not sterile, your skin needs to be as free of germs as possible. You can reduce the number of germs on your skin by washing with CHG (chlorahexidine gluconate) Soap before surgery.  CHG is an antiseptic cleaner which kills germs and bonds with the skin to continue killing germs even after washing.  Oral Hygiene is also important to reduce your risk of infection.  Remember - BRUSH YOUR TEETH THE MORNING OF SURGERY WITH YOUR REGULAR TOOTHPASTE  Please do not use if you have an allergy to CHG or antibacterial soaps. If your skin becomes reddened/irritated stop using the CHG.  Do not shave (including legs and underarms) for at least 48 hours prior to first CHG shower. It is OK to shave your face.  Please follow these instructions carefully.   1. Shower the NIGHT BEFORE SURGERY and the MORNING OF SURGERY with CHG.   2. If you chose to wash  your hair, wash your hair first as usual with your normal shampoo.  3. After you shampoo, rinse your hair and body thoroughly to remove the shampoo.  4. Use CHG as you would any other liquid soap. You can apply CHG directly to the skin and wash gently with a scrungie or a clean washcloth.   5. Apply the CHG Soap to your body ONLY FROM THE NECK DOWN.  Do not use on open wounds or open sores. Avoid contact with your eyes, ears, mouth and genitals (private parts). Wash Face and genitals (private parts)  with your normal soap.  6. Wash thoroughly, paying special attention to the area where your surgery will be performed.  7. Thoroughly rinse your body with warm water from the neck down.  8. DO NOT shower/wash with your normal soap after using and rinsing off the CHG Soap.  9. Pat yourself dry with a CLEAN TOWEL.  10. Wear CLEAN PAJAMAS to bed the night before surgery, wear comfortable clothes the morning of surgery  11. Place CLEAN SHEETS on your bed the night of your first shower and DO NOT SLEEP WITH PETS.  Day of Surgery:  Do not apply any deodorants/lotions.  Please wear clean clothes to the hospital/surgery center.   Remember to brush your teeth WITH YOUR REGULAR TOOTHPASTE.   Do not wear jewelry  Do not wear lotions, powders, or colognes, or deodorant.   Men may shave face and neck.  Do not bring valuables to the hospital.  [facility] is not responsible for any belongings or valuables.  Contacts, dentures or bridgework may not be worn into surgery.  Leave your suitcase in the car.  After surgery it may be brought to your room.  For patients admitted to the hospital, discharge time will be determined by your treatment team.  Patients discharged the day of surgery will not be allowed to drive home.   Please read over the following fact sheets that you were given.

## 2018-07-14 ENCOUNTER — Other Ambulatory Visit: Payer: Self-pay

## 2018-07-14 ENCOUNTER — Encounter (HOSPITAL_COMMUNITY): Payer: Self-pay

## 2018-07-14 ENCOUNTER — Telehealth: Payer: Self-pay | Admitting: Interventional Cardiology

## 2018-07-14 ENCOUNTER — Encounter (HOSPITAL_COMMUNITY)
Admission: RE | Admit: 2018-07-14 | Discharge: 2018-07-14 | Disposition: A | Discharge status: Home / Self Care | Payer: 59 | Source: Ambulatory Visit | Attending: Orthopaedic Surgery | Admitting: Orthopaedic Surgery

## 2018-07-14 DIAGNOSIS — Z01818 Encounter for other preprocedural examination: Secondary | ICD-10-CM | POA: Diagnosis not present

## 2018-07-14 DIAGNOSIS — M1712 Unilateral primary osteoarthritis, left knee: Secondary | ICD-10-CM | POA: Insufficient documentation

## 2018-07-14 LAB — CBC WITH DIFFERENTIAL/PLATELET
Abs Immature Granulocytes: 0.01 10*3/uL (ref 0.00–0.07)
Basophils Absolute: 0 10*3/uL (ref 0.0–0.1)
Basophils Relative: 1 %
Eosinophils Absolute: 0.1 10*3/uL (ref 0.0–0.5)
Eosinophils Relative: 2 %
HCT: 43.4 % (ref 39.0–52.0)
HEMOGLOBIN: 13.7 g/dL (ref 13.0–17.0)
Immature Granulocytes: 0 %
LYMPHS PCT: 55 %
Lymphs Abs: 2.8 10*3/uL (ref 0.7–4.0)
MCH: 30 pg (ref 26.0–34.0)
MCHC: 31.6 g/dL (ref 30.0–36.0)
MCV: 95 fL (ref 80.0–100.0)
Monocytes Absolute: 0.6 10*3/uL (ref 0.1–1.0)
Monocytes Relative: 11 %
Neutro Abs: 1.6 10*3/uL — ABNORMAL LOW (ref 1.7–7.7)
Neutrophils Relative %: 31 %
Platelets: 243 10*3/uL (ref 150–400)
RBC: 4.57 MIL/uL (ref 4.22–5.81)
RDW: 12.7 % (ref 11.5–15.5)
WBC: 5.1 10*3/uL (ref 4.0–10.5)
nRBC: 0 % (ref 0.0–0.2)

## 2018-07-14 LAB — BASIC METABOLIC PANEL
Anion gap: 10 (ref 5–15)
BUN: 14 mg/dL (ref 8–23)
CHLORIDE: 103 mmol/L (ref 98–111)
CO2: 23 mmol/L (ref 22–32)
Calcium: 9.3 mg/dL (ref 8.9–10.3)
Creatinine, Ser: 1.01 mg/dL (ref 0.61–1.24)
GFR calc Af Amer: >60 mL/min (ref >60)
GFR calc non Af Amer: >60 mL/min (ref >60)
Glucose, Bld: 116 mg/dL — ABNORMAL HIGH (ref 70–99)
Potassium: 3.8 mmol/L (ref 3.5–5.1)
Sodium: 136 mmol/L (ref 135–145)

## 2018-07-14 LAB — URINALYSIS, ROUTINE W REFLEX MICROSCOPIC
BILIRUBIN URINE: NEGATIVE
Glucose, UA: NEGATIVE mg/dL
Ketones, ur: NEGATIVE mg/dL
Leukocytes, UA: NEGATIVE
Nitrite: NEGATIVE
Protein, ur: NEGATIVE mg/dL
Specific Gravity, Urine: 1.016 (ref 1.005–1.030)
pH: 5 (ref 5.0–8.0)

## 2018-07-14 LAB — PROTIME-INR
INR: 1.02
Prothrombin Time: 13.3 seconds (ref 11.4–15.2)

## 2018-07-14 LAB — SURGICAL PCR SCREEN
MRSA, PCR: NEGATIVE
Staphylococcus aureus: NEGATIVE

## 2018-07-14 LAB — APTT: aPTT: 31 seconds (ref 24–36)

## 2018-07-14 LAB — GLUCOSE, CAPILLARY: GLUCOSE-CAPILLARY: 97 mg/dL (ref 70–99)

## 2018-07-14 LAB — HEMOGLOBIN A1C
Hgb A1c MFr Bld: 6.1 % — ABNORMAL HIGH (ref 4.8–5.6)
Mean Plasma Glucose: 128.37 mg/dL

## 2018-07-14 NOTE — Progress Notes (Addendum)
PCP - Tedra Senegal, MD Cardiologist - Dr. Pernell Dupre  Chest x-ray - 07/14/2018 EKG -  10/06/17 in [facility]  Stress Test - pt denies ECHO - 07/13/2018 in [facility]  Cardiac Cath - pt denies  Sleep Study - pt denies CPAP - n/a  Fasting Blood Sugar - pt unsure Checks Blood Sugar _____ times a day-does not check at home  Blood Thinner Instructions: n/a Aspirin Instructions: n/a  Anesthesia review: Yes, heart hx, EKG  Patient denies shortness of breath, fever, cough and chest pain at PAT appointment  Patient verbalized understanding of instructions that were given to them at the PAT appointment. Patient was also instructed that they will need to review over the PAT instructions again at home before surgery.

## 2018-07-14 NOTE — Telephone Encounter (Signed)
Spoke with pt and went over echo results.  Scheduled pt to come in to discuss heart cath.  Pt verbalized understanding and was in agreement with this plan.

## 2018-07-14 NOTE — Telephone Encounter (Signed)
New Message ° ° ° ° ° ° ° ° ° °Patient returned your call °

## 2018-07-15 ENCOUNTER — Encounter: Payer: Self-pay | Admitting: Interventional Cardiology

## 2018-07-15 ENCOUNTER — Ambulatory Visit (INDEPENDENT_AMBULATORY_CARE_PROVIDER_SITE_OTHER): Payer: 59 | Admitting: Interventional Cardiology

## 2018-07-15 ENCOUNTER — Encounter: Payer: Self-pay | Admitting: *Deleted

## 2018-07-15 VITALS — BP 128/78 | HR 62 | Ht 70.0 in | Wt 283.0 lb

## 2018-07-15 DIAGNOSIS — E118 Type 2 diabetes mellitus with unspecified complications: Secondary | ICD-10-CM

## 2018-07-15 DIAGNOSIS — E1169 Type 2 diabetes mellitus with other specified complication: Secondary | ICD-10-CM | POA: Diagnosis not present

## 2018-07-15 DIAGNOSIS — E785 Hyperlipidemia, unspecified: Secondary | ICD-10-CM | POA: Diagnosis not present

## 2018-07-15 DIAGNOSIS — I10 Essential (primary) hypertension: Secondary | ICD-10-CM | POA: Diagnosis not present

## 2018-07-15 DIAGNOSIS — I5022 Chronic systolic (congestive) heart failure: Secondary | ICD-10-CM | POA: Diagnosis not present

## 2018-07-15 DIAGNOSIS — I700 Atherosclerosis of aorta: Secondary | ICD-10-CM | POA: Diagnosis not present

## 2018-07-15 LAB — TYPE AND SCREEN
ABO/RH(D): O POS
Antibody Screen: NEGATIVE

## 2018-07-15 NOTE — Progress Notes (Signed)
Cardiology Office Note:    Date:  07/15/2018   ID:  [name] [name], DOB [birth year], MRN [id number]  PCP:  Elby Showers, MD  Cardiologist:  Sinclair Grooms, MD   Referring MD: Elby Showers, MD   Chief Complaint  Patient presents with  . Congestive Heart Failure    History of Present Illness:    [name] [name] is a 68 y.o. male with a hx of  aortic atherosclerosis, type 2 diabetes, hypertension, and chronic combined systolic and diastolic heart failure.  LVEF less than 40% and actually decreased to 30% after instituting guideline directed medical therapy.  [name] [name] was noted to have a dilated left ventricle with reduced ejection fraction in October 2019.  He was relatively asymptomatic at that time, did not have good blood pressure control, and has been chronically obese.  He does not have sleep apnea.  After identification of the problem guideline directed therapy with ACE inhibitor titration and addition of beta-blocker therapy was instituted.  This was on top of therapy with amlodipine and furosemide as chronic background therapy.  He tolerated the new medication additions.  After medication titration he underwent repeat echocardiography which demonstrated a slight reduction in LV cavity size from 66 to 62 mm at end diastole.  EF remained in the 30 to 35% range.  He denies orthopnea, chest pain, and peripheral edema.  He retired from [employer] after 40 years.  Past Medical History:  Diagnosis Date  . Arthritis   . Diabetes mellitus without complication ([place])   . Hypertension     Past Surgical History:  Procedure Laterality Date  . CHOLECYSTECTOMY N/A 02/04/2016   Procedure: LAPAROSCOPIC CHOLECYSTECTOMY;  Surgeon: Leighton Ruff, MD;  Location: [facility];  Service: General;  Laterality: N/A;  . ERCP N/A 02/02/2016   Procedure: ENDOSCOPIC RETROGRADE CHOLANGIOPANCREATOGRAPHY (ERCP);  Surgeon: [name], MD;  Location: [facility];  Service: Endoscopy;   Laterality: N/A;  . HERNIA REPAIR    . SPINE SURGERY      Current Medications: Current Meds  Medication Sig  . amLODipine (NORVASC) 10 MG tablet Take 1 tablet (10 mg total) by mouth daily.  [name] atorvastatin (LIPITOR) 10 MG tablet TAKE 1 TABLET BY MOUTH DAILY. (Patient taking differently: Take 10 mg by mouth every evening. )  . carvedilol (COREG) 25 MG tablet Take 1 tablet (25 mg total) by mouth 2 (two) times daily.  [name] esomeprazole (NEXIUM) 40 MG capsule Take 1 capsule (40 mg total) by mouth daily.  . furosemide (LASIX) 40 MG tablet TAKE ONE TABLET BY MOUTH DAILY (Patient taking differently: Take 40 mg by mouth daily. Take one tablet by mouth daily)  . lisinopril (PRINIVIL,ZESTRIL) 10 MG tablet Take 10 mg by mouth daily.  [name] lisinopril (PRINIVIL,ZESTRIL) 20 MG tablet Take 1 tablet (20 mg total) by mouth daily.  . meloxicam (MOBIC) 15 MG tablet TAKE 1 TABLET BY MOUTH ONCE DAILY (Patient taking differently: Take 15 mg by mouth daily. )  . metFORMIN (GLUCOPHAGE-XR) 500 MG 24 hr tablet Take 1 tablet (500 mg total) by mouth 2 (two) times daily.  . sildenafil (VIAGRA) 100 MG tablet Take 100 mg by mouth daily as needed for erectile dysfunction.  [name] zolpidem (AMBIEN) 5 MG tablet Take 1 tablet (5 mg total) by mouth at bedtime as needed for sleep.     Allergies:   Patient has no known allergies.   Social History   Socioeconomic History  . Marital status: Married  Spouse name: Not on file  . Number of children: Not on file  . Years of education: Not on file  . Highest education level: Not on file  Occupational History  . Not on file  Social Needs  . Financial resource strain: Not on file  . Food insecurity:    Worry: Not on file    Inability: Not on file  . Transportation needs:    Medical: Not on file    Non-medical: Not on file  Tobacco Use  . Smoking status: Never Smoker  . Smokeless tobacco: Never Used  Substance and Sexual Activity  . Alcohol use: No  . Drug use: No  . Sexual  activity: Yes  Lifestyle  . Physical activity:    Days per week: Not on file    Minutes per session: Not on file  . Stress: Not on file  Relationships  . Social connections:    Talks on phone: Not on file    Gets together: Not on file    Attends religious service: Not on file    Active member of club or organization: Not on file    Attends meetings of clubs or organizations: Not on file    Relationship status: Not on file  Other Topics Concern  . Not on file  Social History Narrative  . Not on file     Family History: The patient's family history includes Hypertension in his mother; Stroke in his mother.  ROS:   Please see the history of present illness.    He denies snoring, excessive daytime sleepiness, peripheral edema, syncope, palpitations, and chest discomfort.  All other systems reviewed and are negative.  EKGs/Labs/Other Studies Reviewed:    The following studies were reviewed today: 2D Doppler echocardiogram 07/13/2018: tudy Conclusions  - Left ventricle: The cavity size was moderately dilated. Wall   thickness was normal. Systolic function was moderately to   severely reduced. The estimated ejection fraction was in the   range of 30% to 35%. Diffuse hypokinesis. Features are consistent   with a pseudonormal left ventricular filling pattern, with   concomitant abnormal relaxation and increased filling pressure   (grade 2 diastolic dysfunction). No evidence of thrombus. - Left atrium: The atrium was severely dilated.  ------------------------------------------------------------------- Labs, prior tests, procedures, and surgery: Transthoracic echocardiography (12/24/2017).     EF was 40% and PA pressure was 33 (systolic).  EKG:  EKG is normal sinus rhythm, first-degree AV block at 214 ms nonspecific T wave flattening, and relative low voltage.  Recent Labs: 12/13/2017: ALT 27 07/14/2018: BUN 14; Creatinine, Ser 1.01; Hemoglobin 13.7; Platelets 243; Potassium 3.8;  Sodium 136  Recent Lipid Panel    Component Value Date/Time   CHOL 148 03/23/2018 0942   TRIG 81 03/23/2018 0942   HDL 52 03/23/2018 0942   CHOLHDL 2.8 03/23/2018 0942   VLDL 14 10/18/2016 1048   LDLCALC 80 03/23/2018 0942    Physical Exam:    VS:  BP 128/78   Pulse 62   Ht 5\' 10"  (1.778 m)   Wt 283 lb (128.4 kg)   BMI 40.61 kg/m     Wt Readings from Last 3 Encounters:  07/15/18 283 lb (128.4 kg)  07/14/18 283 lb 9.6 oz (128.6 kg)  04/22/18 (!) 300 lb 12.8 oz (136.4 kg)     GEN: Obese African-American male.. No acute distress HEENT: Normal NECK: No JVD. LYMPHATICS: No lymphadenopathy CARDIAC: RRR.  No murmur, gallop, edema VASCULAR: Pulses are 2+ and symmetric in  the carotid, radial, and posterior tibial positions bilaterally., Bruits are not heard in the carotid region RESPIRATORY:  Clear to auscultation without rales, wheezing or rhonchi  ABDOMEN: Soft, non-tender, non-distended, No pulsatile mass, MUSCULOSKELETAL: No deformity  SKIN: Warm and dry NEUROLOGIC:  Alert and oriented x 3 PSYCHIATRIC:  Normal affect   ASSESSMENT:    1. Chronic systolic heart failure ([place])   2. Type 2 diabetes mellitus with complication, without long-term current use of insulin ([facility])   3. Abdominal aortic atherosclerosis ([place])   4. Essential hypertension   5. Hyperlipidemia associated with type 2 diabetes mellitus ([place])    PLAN:    In order of problems listed above:  1. Chronic systolic heart failure with slight reduction in LV end-diastolic diameter on guideline directed therapy but persistent reduction in LVEF in the 30 to 40% range.  Given risk factors which include age, lipids, diabetes, and hypertension, I have recommended coronary angiography in the setting of left and right heart catheterization to fully assess etiology of heart failure.  He has no anginal symptoms and I will be surprised if coronary disease is present, but this needs to be excluded.  Plan will be to continue his  current guideline directed therapy and to perform left and right heart cath from right radial and brachial sites as soon as reasonably possible.  This will make it easier to specifically cleared the patient for upcoming total knee replacement surgery by Dr. Latanya Maudlin. 2. Not assessed 3. Not assessed 4. Adequate blood pressure control with target less than 130/80 mmHg 5. LDL target should be less than 70  The patient was counseled to undergo left heart catheterization, coronary angiography, and possible percutaneous coronary intervention with stent implantation. The procedural risks and benefits were discussed in detail. The risks discussed included death, stroke, myocardial infarction, life-threatening bleeding, limb ischemia, kidney injury, allergy, and possible emergency cardiac surgery. The risk of these significant complications were estimated to occur less than 1% of the time. After discussion, the patient has agreed to proceed.    Medication Adjustments/Labs and Tests Ordered: Current medicines are reviewed at length with the patient today.  Concerns regarding medicines are outlined above.  Orders Placed This Encounter  Procedures  . EKG 12-Lead   No orders of the defined types were placed in this encounter.   Patient Instructions  Medication Instructions:  No change If you need a refill on your cardiac medications before your next appointment, please call your pharmacy.   Lab work: None today If you have labs (blood work) drawn today and your tests are completely normal, you will receive your results only by: [name] MyChart Message (if you have MyChart) OR . A paper copy in the mail If you have any lab test that is abnormal or we need to change your treatment, we will call you to review the results.  Testing/Procedures: Your physician has requested that you have a cardiac catheterization. Cardiac catheterization is used to diagnose and/or treat various heart conditions. Doctors may  recommend this procedure for a number of different reasons. The most common reason is to evaluate chest pain. Chest pain can be a symptom of coronary artery disease (CAD), and cardiac catheterization can show whether plaque is narrowing or blocking your heart's arteries. This procedure is also used to evaluate the valves, as well as measure the blood flow and oxygen levels in different parts of your heart. For further information please visit [facility]iest[facility][email or web address]. Please follow instruction sheet, as given.   Follow-Up: At  [facility], you and your health needs are our priority.  As part of our continuing mission to provide you with exceptional heart care, we have created designated Provider Care Teams.  These Care Teams include your primary Cardiologist (physician) and Advanced Practice Providers (APPs -  Physician Assistants and Nurse Practitioners) who all work together to provide you with the care you need, when you need it. You will need a follow up appointment in 3 months.  Please call our office 2 months in advance to schedule this appointment.  You may see Sinclair Grooms, MD or one of the following Advanced Practice Providers on your designated Care Team:   Truitt Merle, NP Cecilie Kicks, NP . Kathyrn Drown, NP  Any Other Special Instructions Will Be Listed Below (If Applicable).       Signed, Sinclair Grooms, MD  07/15/2018 5:39 PM    [place]

## 2018-07-15 NOTE — Patient Instructions (Signed)
Medication Instructions:  No change If you need a refill on your cardiac medications before your next appointment, please call your pharmacy.   Lab work: None today If you have labs (blood work) drawn today and your tests are completely normal, you will receive your results only by: [name] MyChart Message (if you have MyChart) OR . A paper copy in the mail If you have any lab test that is abnormal or we need to change your treatment, we will call you to review the results.  Testing/Procedures: Your physician has requested that you have a cardiac catheterization. Cardiac catheterization is used to diagnose and/or treat various heart conditions. Doctors may recommend this procedure for a number of different reasons. The most common reason is to evaluate chest pain. Chest pain can be a symptom of coronary artery disease (CAD), and cardiac catheterization can show whether plaque is narrowing or blocking your heart's arteries. This procedure is also used to evaluate the valves, as well as measure the blood flow and oxygen levels in different parts of your heart. For further information please visit [email or web address]. Please follow instruction sheet, as given.   Follow-Up: At [facility], you and your health needs are our priority.  As part of our continuing mission to provide you with exceptional heart care, we have created designated Provider Care Teams.  These Care Teams include your primary Cardiologist (physician) and Advanced Practice Providers (APPs -  Physician Assistants and Nurse Practitioners) who all work together to provide you with the care you need, when you need it. You will need a follow up appointment in 3 months.  Please call our office 2 months in advance to schedule this appointment.  You may see Sinclair Grooms, MD or one of the following Advanced Practice Providers on your designated Care Team:   Truitt Merle, NP Cecilie Kicks, NP . Kathyrn Drown, NP  Any Other Special  Instructions Will Be Listed Below (If Applicable).

## 2018-07-15 NOTE — H&P (View-Only) (Signed)
Cardiology Office Note:    Date:  07/15/2018   ID:  [name], DOB 1950-09-08, MRN [id number]  PCP:  Elby Showers, MD  Cardiologist:  Sinclair Grooms, MD   Referring MD: Elby Showers, MD   Chief Complaint  Patient presents with  . Congestive Heart Failure    History of Present Illness:    KRISTION HOLIFIELD is a 68 y.o. male with a hx of  aortic atherosclerosis, type 2 diabetes, hypertension, and chronic combined systolic and diastolic heart failure.  LVEF less than 40% and actually decreased to 30% after instituting guideline directed medical therapy.  Casper was noted to have a dilated left ventricle with reduced ejection fraction in October 2019.  He was relatively asymptomatic at that time, did not have good blood pressure control, and has been chronically obese.  He does not have sleep apnea.  After identification of the problem guideline directed therapy with ACE inhibitor titration and addition of beta-blocker therapy was instituted.  This was on top of therapy with amlodipine and furosemide as chronic background therapy.  He tolerated the new medication additions.  After medication titration he underwent repeat echocardiography which demonstrated a slight reduction in LV cavity size from 66 to 62 mm at end diastole.  EF remained in the 30 to 35% range.  He denies orthopnea, chest pain, and peripheral edema.  He retired from [employer] after 40 years.  Past Medical History:  Diagnosis Date  . Arthritis   . Diabetes mellitus without complication ([place])   . Hypertension     Past Surgical History:  Procedure Laterality Date  . CHOLECYSTECTOMY N/A 02/04/2016   Procedure: LAPAROSCOPIC CHOLECYSTECTOMY;  Surgeon: Leighton Ruff, MD;  Location: [facility];  Service: General;  Laterality: N/A;  . ERCP N/A 02/02/2016   Procedure: ENDOSCOPIC RETROGRADE CHOLANGIOPANCREATOGRAPHY (ERCP);  Surgeon: [name], MD;  Location: [facility];  Service: Endoscopy;   Laterality: N/A;  . HERNIA REPAIR    . SPINE SURGERY      Current Medications: Current Meds  Medication Sig  . amLODipine (NORVASC) 10 MG tablet Take 1 tablet (10 mg total) by mouth daily.  [name] atorvastatin (LIPITOR) 10 MG tablet TAKE 1 TABLET BY MOUTH DAILY. (Patient taking differently: Take 10 mg by mouth every evening. )  . carvedilol (COREG) 25 MG tablet Take 1 tablet (25 mg total) by mouth 2 (two) times daily.  [name] esomeprazole (NEXIUM) 40 MG capsule Take 1 capsule (40 mg total) by mouth daily.  . furosemide (LASIX) 40 MG tablet TAKE ONE TABLET BY MOUTH DAILY (Patient taking differently: Take 40 mg by mouth daily. Take one tablet by mouth daily)  . lisinopril (PRINIVIL,ZESTRIL) 10 MG tablet Take 10 mg by mouth daily.  [name] lisinopril (PRINIVIL,ZESTRIL) 20 MG tablet Take 1 tablet (20 mg total) by mouth daily.  . meloxicam (MOBIC) 15 MG tablet TAKE 1 TABLET BY MOUTH ONCE DAILY (Patient taking differently: Take 15 mg by mouth daily. )  . metFORMIN (GLUCOPHAGE-XR) 500 MG 24 hr tablet Take 1 tablet (500 mg total) by mouth 2 (two) times daily.  . sildenafil (VIAGRA) 100 MG tablet Take 100 mg by mouth daily as needed for erectile dysfunction.  [name] zolpidem (AMBIEN) 5 MG tablet Take 1 tablet (5 mg total) by mouth at bedtime as needed for sleep.     Allergies:   Patient has no known allergies.   Social History   Socioeconomic History  . Marital status: Married  Spouse name: Not on file  . Number of children: Not on file  . Years of education: Not on file  . Highest education level: Not on file  Occupational History  . Not on file  Social Needs  . Financial resource strain: Not on file  . Food insecurity:    Worry: Not on file    Inability: Not on file  . Transportation needs:    Medical: Not on file    Non-medical: Not on file  Tobacco Use  . Smoking status: Never Smoker  . Smokeless tobacco: Never Used  Substance and Sexual Activity  . Alcohol use: No  . Drug use: No  . Sexual  activity: Yes  Lifestyle  . Physical activity:    Days per week: Not on file    Minutes per session: Not on file  . Stress: Not on file  Relationships  . Social connections:    Talks on phone: Not on file    Gets together: Not on file    Attends religious service: Not on file    Active member of club or organization: Not on file    Attends meetings of clubs or organizations: Not on file    Relationship status: Not on file  Other Topics Concern  . Not on file  Social History Narrative  . Not on file     Family History: The patient's family history includes Hypertension in his mother; Stroke in his mother.  ROS:   Please see the history of present illness.    He denies snoring, excessive daytime sleepiness, peripheral edema, syncope, palpitations, and chest discomfort.  All other systems reviewed and are negative.  EKGs/Labs/Other Studies Reviewed:    The following studies were reviewed today: 2D Doppler echocardiogram 07/13/2018: tudy Conclusions  - Left ventricle: The cavity size was moderately dilated. Wall   thickness was normal. Systolic function was moderately to   severely reduced. The estimated ejection fraction was in the   range of 30% to 35%. Diffuse hypokinesis. Features are consistent   with a pseudonormal left ventricular filling pattern, with   concomitant abnormal relaxation and increased filling pressure   (grade 2 diastolic dysfunction). No evidence of thrombus. - Left atrium: The atrium was severely dilated.  ------------------------------------------------------------------- Labs, prior tests, procedures, and surgery: Transthoracic echocardiography (12/24/2017).     EF was 40% and PA pressure was 33 (systolic).  EKG:  EKG is normal sinus rhythm, first-degree AV block at 214 ms nonspecific T wave flattening, and relative low voltage.  Recent Labs: 12/13/2017: ALT 27 07/14/2018: BUN 14; Creatinine, Ser 1.01; Hemoglobin 13.7; Platelets 243; Potassium 3.8;  Sodium 136  Recent Lipid Panel    Component Value Date/Time   CHOL 148 03/23/2018 0942   TRIG 81 03/23/2018 0942   HDL 52 03/23/2018 0942   CHOLHDL 2.8 03/23/2018 0942   VLDL 14 10/18/2016 1048   LDLCALC 80 03/23/2018 0942    Physical Exam:    VS:  BP 128/78   Pulse 62   Ht 5\' 10"  (1.778 m)   Wt 283 lb (128.4 kg)   BMI 40.61 kg/m     Wt Readings from Last 3 Encounters:  07/15/18 283 lb (128.4 kg)  07/14/18 283 lb 9.6 oz (128.6 kg)  04/22/18 (!) 300 lb 12.8 oz (136.4 kg)     GEN: Obese African-American male.. No acute distress HEENT: Normal NECK: No JVD. LYMPHATICS: No lymphadenopathy CARDIAC: RRR.  No murmur, gallop, edema VASCULAR: Pulses are 2+ and symmetric in  the carotid, radial, and posterior tibial positions bilaterally., Bruits are not heard in the carotid region RESPIRATORY:  Clear to auscultation without rales, wheezing or rhonchi  ABDOMEN: Soft, non-tender, non-distended, No pulsatile mass, MUSCULOSKELETAL: No deformity  SKIN: Warm and dry NEUROLOGIC:  Alert and oriented x 3 PSYCHIATRIC:  Normal affect   ASSESSMENT:    1. Chronic systolic heart failure ([place])   2. Type 2 diabetes mellitus with complication, without long-term current use of insulin ([facility])   3. Abdominal aortic atherosclerosis ([place])   4. Essential hypertension   5. Hyperlipidemia associated with type 2 diabetes mellitus ([place])    PLAN:    In order of problems listed above:  1. Chronic systolic heart failure with slight reduction in LV end-diastolic diameter on guideline directed therapy but persistent reduction in LVEF in the 30 to 40% range.  Given risk factors which include age, lipids, diabetes, and hypertension, I have recommended coronary angiography in the setting of left and right heart catheterization to fully assess etiology of heart failure.  He has no anginal symptoms and I will be surprised if coronary disease is present, but this needs to be excluded.  Plan will be to continue his  current guideline directed therapy and to perform left and right heart cath from right radial and brachial sites as soon as reasonably possible.  This will make it easier to specifically cleared the patient for upcoming total knee replacement surgery by Dr. Latanya Maudlin. 2. Not assessed 3. Not assessed 4. Adequate blood pressure control with target less than 130/80 mmHg 5. LDL target should be less than 70  The patient was counseled to undergo left heart catheterization, coronary angiography, and possible percutaneous coronary intervention with stent implantation. The procedural risks and benefits were discussed in detail. The risks discussed included death, stroke, myocardial infarction, life-threatening bleeding, limb ischemia, kidney injury, allergy, and possible emergency cardiac surgery. The risk of these significant complications were estimated to occur less than 1% of the time. After discussion, the patient has agreed to proceed.    Medication Adjustments/Labs and Tests Ordered: Current medicines are reviewed at length with the patient today.  Concerns regarding medicines are outlined above.  Orders Placed This Encounter  Procedures  . EKG 12-Lead   No orders of the defined types were placed in this encounter.   Patient Instructions  Medication Instructions:  No change If you need a refill on your cardiac medications before your next appointment, please call your pharmacy.   Lab work: None today If you have labs (blood work) drawn today and your tests are completely normal, you will receive your results only by: [name] MyChart Message (if you have MyChart) OR . A paper copy in the mail If you have any lab test that is abnormal or we need to change your treatment, we will call you to review the results.  Testing/Procedures: Your physician has requested that you have a cardiac catheterization. Cardiac catheterization is used to diagnose and/or treat various heart conditions. Doctors may  recommend this procedure for a number of different reasons. The most common reason is to evaluate chest pain. Chest pain can be a symptom of coronary artery disease (CAD), and cardiac catheterization can show whether plaque is narrowing or blocking your heart's arteries. This procedure is also used to evaluate the valves, as well as measure the blood flow and oxygen levels in different parts of your heart. For further information please visit [email or web address]. Please follow instruction sheet, as given.   Follow-Up: At  [facility], you and your health needs are our priority.  As part of our continuing mission to provide you with exceptional heart care, we have created designated Provider Care Teams.  These Care Teams include your primary Cardiologist (physician) and Advanced Practice Providers (APPs -  Physician Assistants and Nurse Practitioners) who all work together to provide you with the care you need, when you need it. You will need a follow up appointment in 3 months.  Please call our office 2 months in advance to schedule this appointment.  You may see Sinclair Grooms, MD or one of the following Advanced Practice Providers on your designated Care Team:   Truitt Merle, NP Cecilie Kicks, NP . Kathyrn Drown, NP  Any Other Special Instructions Will Be Listed Below (If Applicable).       Signed, Sinclair Grooms, MD  07/15/2018 5:39 PM    [place]

## 2018-07-17 DIAGNOSIS — M1990 Unspecified osteoarthritis, unspecified site: Secondary | ICD-10-CM | POA: Diagnosis not present

## 2018-07-20 ENCOUNTER — Ambulatory Visit: Payer: 59 | Admitting: Interventional Cardiology

## 2018-07-21 ENCOUNTER — Telehealth: Payer: Self-pay | Admitting: *Deleted

## 2018-07-21 MED FILL — CARVEDILOL 25 MG TABLET: 25 | 90 days supply | Qty: 180 | Fill #1

## 2018-07-21 NOTE — Addendum Note (Signed)
Addended by: Katrine Coho on: 07/21/2018 12:02 PM   Modules accepted: Orders

## 2018-07-21 NOTE — Telephone Encounter (Signed)
Pt contacted pre-catheterization scheduled at [facility] for: Thursday July 23, 2018 7:30 AM Verified arrival time and place: [facility] Entrance A at: 5:30 AM  No solid food after midnight prior to cath, clear liquids until 5 AM day of procedure. Contrast allergy: no  Hold: Metformin-day of procedure and 48 hours post procedure. Furosemide-AM of procedure.  Except hold medications AM meds can be  taken pre-cath with sip of water including: ASA 81 mg  Confirmed patient has responsible person to drive home post procedure and for 24 hours after you arrive home: yes

## 2018-07-23 ENCOUNTER — Ambulatory Visit (HOSPITAL_COMMUNITY)
Admission: RE | Admit: 2018-07-23 | Discharge: 2018-07-23 | Disposition: A | Discharge status: Home / Self Care | Payer: 59 | Attending: Interventional Cardiology | Admitting: Interventional Cardiology

## 2018-07-23 ENCOUNTER — Other Ambulatory Visit: Payer: Self-pay

## 2018-07-23 ENCOUNTER — Encounter (HOSPITAL_COMMUNITY): Payer: Self-pay | Admitting: Interventional Cardiology

## 2018-07-23 ENCOUNTER — Encounter (HOSPITAL_COMMUNITY)
Admission: RE | Disposition: A | Discharge status: Home / Self Care | Payer: Self-pay | Source: Home / Self Care | Attending: Interventional Cardiology

## 2018-07-23 DIAGNOSIS — I700 Atherosclerosis of aorta: Secondary | ICD-10-CM | POA: Diagnosis not present

## 2018-07-23 DIAGNOSIS — I5042 Chronic combined systolic (congestive) and diastolic (congestive) heart failure: Secondary | ICD-10-CM | POA: Diagnosis not present

## 2018-07-23 DIAGNOSIS — I110 Hypertensive heart disease with heart failure: Secondary | ICD-10-CM | POA: Insufficient documentation

## 2018-07-23 DIAGNOSIS — E119 Type 2 diabetes mellitus without complications: Secondary | ICD-10-CM | POA: Insufficient documentation

## 2018-07-23 DIAGNOSIS — Z823 Family history of stroke: Secondary | ICD-10-CM | POA: Diagnosis not present

## 2018-07-23 DIAGNOSIS — Z7984 Long term (current) use of oral hypoglycemic drugs: Secondary | ICD-10-CM | POA: Insufficient documentation

## 2018-07-23 DIAGNOSIS — I2510 Atherosclerotic heart disease of native coronary artery without angina pectoris: Secondary | ICD-10-CM | POA: Insufficient documentation

## 2018-07-23 DIAGNOSIS — M1990 Unspecified osteoarthritis, unspecified site: Secondary | ICD-10-CM | POA: Insufficient documentation

## 2018-07-23 DIAGNOSIS — Z8249 Family history of ischemic heart disease and other diseases of the circulatory system: Secondary | ICD-10-CM | POA: Insufficient documentation

## 2018-07-23 DIAGNOSIS — E1169 Type 2 diabetes mellitus with other specified complication: Secondary | ICD-10-CM | POA: Diagnosis present

## 2018-07-23 DIAGNOSIS — Z79899 Other long term (current) drug therapy: Secondary | ICD-10-CM | POA: Diagnosis not present

## 2018-07-23 DIAGNOSIS — I5022 Chronic systolic (congestive) heart failure: Secondary | ICD-10-CM

## 2018-07-23 DIAGNOSIS — I493 Ventricular premature depolarization: Secondary | ICD-10-CM | POA: Diagnosis present

## 2018-07-23 DIAGNOSIS — E785 Hyperlipidemia, unspecified: Secondary | ICD-10-CM | POA: Diagnosis not present

## 2018-07-23 DIAGNOSIS — Z6841 Body Mass Index (BMI) 40.0 and over, adult: Secondary | ICD-10-CM | POA: Diagnosis not present

## 2018-07-23 DIAGNOSIS — E118 Type 2 diabetes mellitus with unspecified complications: Secondary | ICD-10-CM | POA: Diagnosis present

## 2018-07-23 DIAGNOSIS — E669 Obesity, unspecified: Secondary | ICD-10-CM | POA: Insufficient documentation

## 2018-07-23 HISTORY — PX: RIGHT/LEFT HEART CATH AND CORONARY ANGIOGRAPHY: CATH118266

## 2018-07-23 HISTORY — PX: CARDIAC CATHETERIZATION: SHX172

## 2018-07-23 LAB — POCT I-STAT 3, ART BLOOD GAS (G3+)
Acid-base deficit: 4 mmol/L — ABNORMAL HIGH (ref 0.0–2.0)
Bicarbonate: 23.3 mmol/L (ref 20.0–28.0)
O2 Saturation: 97 %
PO2 ART: 104 mmHg (ref 83.0–108.0)
TCO2: 25 mmol/L (ref 22–32)
pCO2 arterial: 50.7 mmHg — ABNORMAL HIGH (ref 32.0–48.0)
pH, Arterial: 7.27 — ABNORMAL LOW (ref 7.350–7.450)

## 2018-07-23 LAB — POCT I-STAT 3, VENOUS BLOOD GAS (G3P V)
Acid-Base Excess: 1 mmol/L (ref 0.0–2.0)
Bicarbonate: 27.3 mmol/L (ref 20.0–28.0)
O2 Saturation: 76 %
PO2 VEN: 43 mmHg (ref 32.0–45.0)
TCO2: 29 mmol/L (ref 22–32)
pCO2, Ven: 48.6 mmHg (ref 44.0–60.0)
pH, Ven: 7.358 (ref 7.250–7.430)

## 2018-07-23 LAB — GLUCOSE, CAPILLARY: Glucose-Capillary: 126 mg/dL — ABNORMAL HIGH (ref 70–99)

## 2018-07-23 SURGERY — RIGHT/LEFT HEART CATH AND CORONARY ANGIOGRAPHY
Anesthesia: LOCAL

## 2018-07-23 MED ORDER — ACETAMINOPHEN 325 MG PO TABS: 650.0000 mg | ORAL_TABLET | ORAL | Status: DC | PRN

## 2018-07-23 MED ORDER — HEPARIN SODIUM (PORCINE) 1000 UNIT/ML IJ SOLN
INTRAMUSCULAR | Status: AC
  Filled 2018-07-23: qty 1

## 2018-07-23 MED ORDER — LIDOCAINE HCL (PF) 1 % IJ SOLN
INTRAMUSCULAR | Status: AC
  Filled 2018-07-23: qty 30

## 2018-07-23 MED ORDER — HEPARIN SODIUM (PORCINE) 1000 UNIT/ML IJ SOLN
INTRAMUSCULAR | Status: DC | PRN
  Administered 2018-07-23: 6000 [IU] via INTRAVENOUS

## 2018-07-23 MED ORDER — SODIUM CHLORIDE 0.9% FLUSH: 3.0000 mL | INTRAVENOUS | Status: DC | PRN

## 2018-07-23 MED ORDER — SODIUM CHLORIDE 0.9 % IV SOLN: 250.0000 mL | INTRAVENOUS | Status: DC | PRN

## 2018-07-23 MED ORDER — FENTANYL CITRATE (PF) 100 MCG/2ML IJ SOLN
INTRAMUSCULAR | Status: DC | PRN
  Administered 2018-07-23: 50 ug via INTRAVENOUS
  Administered 2018-07-23 (×2): 25 ug via INTRAVENOUS

## 2018-07-23 MED ORDER — SODIUM CHLORIDE 0.9 % IV SOLN
INTRAVENOUS | Status: DC
  Administered 2018-07-23: 07:00:00 via INTRAVENOUS

## 2018-07-23 MED ORDER — VERAPAMIL HCL 2.5 MG/ML IV SOLN
INTRAVENOUS | Status: AC
  Filled 2018-07-23: qty 2

## 2018-07-23 MED ORDER — HEPARIN (PORCINE) IN NACL 1000-0.9 UT/500ML-% IV SOLN
INTRAVENOUS | Status: DC | PRN
  Administered 2018-07-23 (×2): 500 mL

## 2018-07-23 MED ORDER — VERAPAMIL HCL 2.5 MG/ML IV SOLN
INTRAVENOUS | Status: DC | PRN
  Administered 2018-07-23: 10 mL via INTRA_ARTERIAL

## 2018-07-23 MED ORDER — ONDANSETRON HCL 4 MG/2ML IJ SOLN: 4.0000 mg | Freq: Four times a day (QID) | INTRAMUSCULAR | Status: DC | PRN

## 2018-07-23 MED ORDER — LIDOCAINE HCL (PF) 1 % IJ SOLN
INTRAMUSCULAR | Status: DC | PRN
  Administered 2018-07-23 (×2): 2 mL via INTRADERMAL

## 2018-07-23 MED ORDER — SODIUM CHLORIDE 0.9% FLUSH: 3.0000 mL | Freq: Two times a day (BID) | INTRAVENOUS | Status: DC

## 2018-07-23 MED ORDER — HEPARIN (PORCINE) IN NACL 1000-0.9 UT/500ML-% IV SOLN
INTRAVENOUS | Status: AC
  Filled 2018-07-23: qty 1000

## 2018-07-23 MED ORDER — MIDAZOLAM HCL 2 MG/2ML IJ SOLN
INTRAMUSCULAR | Status: AC
  Filled 2018-07-23: qty 2

## 2018-07-23 MED ORDER — MIDAZOLAM HCL 2 MG/2ML IJ SOLN
INTRAMUSCULAR | Status: DC | PRN
  Administered 2018-07-23 (×2): 0.5 mg via INTRAVENOUS
  Administered 2018-07-23: 1 mg via INTRAVENOUS

## 2018-07-23 MED ORDER — IOHEXOL 350 MG/ML SOLN
INTRAVENOUS | Status: DC | PRN
  Administered 2018-07-23: 70 mL via INTRACARDIAC

## 2018-07-23 MED ORDER — FENTANYL CITRATE (PF) 100 MCG/2ML IJ SOLN
INTRAMUSCULAR | Status: AC
  Filled 2018-07-23: qty 2

## 2018-07-23 MED ORDER — OXYCODONE HCL 5 MG PO TABS: 5.0000 mg | ORAL_TABLET | ORAL | Status: DC | PRN

## 2018-07-23 MED ORDER — ASPIRIN 81 MG PO CHEW: 81.0000 mg | CHEWABLE_TABLET | ORAL | Status: DC

## 2018-07-23 MED ORDER — SODIUM CHLORIDE 0.9 % IV SOLN: INTRAVENOUS | Status: DC

## 2018-07-23 SURGICAL SUPPLY — 11 items
CATH BALLN WEDGE 5F 110CM (CATHETERS) ×1 IMPLANT
CATH INFINITI 5 FR JL3.5 (CATHETERS) ×1 IMPLANT
CATH INFINITI JR4 5F (CATHETERS) ×1 IMPLANT
GLIDESHEATH SLEND A-KIT 6F 22G (SHEATH) ×2 IMPLANT
GUIDEWIRE INQWIRE 1.5J.035X260 (WIRE) IMPLANT
INQWIRE 1.5J .035X260CM (WIRE) ×2
KIT HEART LEFT (KITS) ×2 IMPLANT
PACK CARDIAC CATHETERIZATION (CUSTOM PROCEDURE TRAY) ×2 IMPLANT
SHEATH GLIDE SLENDER 4/5FR (SHEATH) ×1 IMPLANT
TRANSDUCER W/STOPCOCK (MISCELLANEOUS) ×2 IMPLANT
TUBING CIL FLEX 10 FLL-RA (TUBING) ×2 IMPLANT

## 2018-07-23 NOTE — Progress Notes (Signed)
Anesthesia Chart Review:  Case:  [id number] Date/Time:  07/28/18 0715   Procedure:  TOTAL KNEE ARTHROPLASTY (Left )   Anesthesia type:  Spinal   Pre-op diagnosis:  LEFT KNEE DEGENERATIVE JOINT DISEASE   Location:  [facility] OR ROOM 07 / [facility]   Surgeon:  Melrose Nakayama, MD      DISCUSSION: 68 yo never smoker. Pertinen hx includes DMII, HTN, Combined HF.  Pt follows with Dr. Tamala Julian for hx of aortic atherosclerosis, type 2 diabetes, hypertension, and chronic combined systolic and diastolic heart failure. Pt had echo 07/13/18 showing reduced EF 30-35% (down from 40% - 45% on  12/2017), grade 2 dd, normal valves. Dr. Tamala Julian recommended cath to rule out CAD prior to surgery. Cath performed 07/23/18. Results below:   25 to 30% mid LAD nonobstructive atherosclerosis.  Right dominant coronary anatomy with normal widely patent RCA.  Normal left main.  Normal circumflex.  Normal large ramus intermedius.  Normal hemodynamics with mean pulmonary artery pressure of 21, mean pulmonary capillary wedge pressure of 4 mmHg. Recommendations:  Aggressive guideline directed medical therapy for systolic dysfunction: Transition to ARNI from ACE inhibitor therapy, continue beta-blocker therapy, and add mineralocorticoid receptor antagonist.  Aggressive lowering of lipids to LDL less than 70 and preferably less than 50.  Discharge later today.  Cleared for upcoming total knee replacement surgery without reservations.  Anticipate he can proceed as planned barring acute status change.  VS: BP (!) 152/71   Pulse 73   Temp (!) 36.4 C   Ht 5\' 10"  (1.778 m)   Wt 128.6 kg   SpO2 98%   BMI 40.69 kg/m   PROVIDERS: Elby Showers, MD is PCP  Daneen Schick, MD is Cardiologist  LABS: Labs reviewed: Acceptable for surgery. (all labs ordered are listed, but only abnormal results are displayed)  Labs Reviewed  BASIC METABOLIC PANEL - Abnormal; Notable for the following components:      Result Value   Glucose, Bld  116 (*)    All other components within normal limits  CBC WITH DIFFERENTIAL/PLATELET - Abnormal; Notable for the following components:   Neutro Abs 1.6 (*)    All other components within normal limits  URINALYSIS, ROUTINE W REFLEX MICROSCOPIC - Abnormal; Notable for the following components:   Hgb urine dipstick SMALL (*)    Bacteria, UA RARE (*)    All other components within normal limits  HEMOGLOBIN A1C - Abnormal; Notable for the following components:   Hgb A1c MFr Bld 6.1 (*)    All other components within normal limits  SURGICAL PCR SCREEN  APTT  PROTIME-INR  GLUCOSE, CAPILLARY  TYPE AND SCREEN     IMAGES: CHEST - 2 VIEW 07/14/2018  COMPARISON:  None.  FINDINGS: Heart is borderline in size. Lungs clear. No effusions or acute bony abnormality.  IMPRESSION: Borderline heart size.  No active disease.  EKG: 07/15/2018: Sinus rhythm with 1st degree AV block. Nonspecific T wave abnormality.  CV: Cath 07/23/2018:  25 to 30% mid LAD nonobstructive atherosclerosis.  Right dominant coronary anatomy with normal widely patent RCA.  Normal left main.  Normal circumflex.  Normal large ramus intermedius.  Normal hemodynamics with mean pulmonary artery pressure of 21, mean pulmonary capillary wedge pressure of 4 mmHg.  Recommendations:   Aggressive guideline directed medical therapy for systolic dysfunction: Transition to ARNI from ACE inhibitor therapy, continue beta-blocker therapy, and add mineralocorticoid receptor antagonist.  Aggressive lowering of lipids to LDL less than 70 and preferably less than  50.  Discharge later today.  Cleared for upcoming total knee replacement surgery without reservations.  TTE 07/13/2018: Study Conclusions  - Left ventricle: The cavity size was moderately dilated. Wall   thickness was normal. Systolic function was moderately to   severely reduced. The estimated ejection fraction was in the   range of 30% to 35%. Diffuse  hypokinesis. Features are consistent   with a pseudonormal left ventricular filling pattern, with   concomitant abnormal relaxation and increased filling pressure   (grade 2 diastolic dysfunction). No evidence of thrombus. - Left atrium: The atrium was severely dilated.  Past Medical History:  Diagnosis Date  . Arthritis   . Diabetes mellitus without complication ([place])   . Hypertension     Past Surgical History:  Procedure Laterality Date  . CHOLECYSTECTOMY N/A 02/04/2016   Procedure: LAPAROSCOPIC CHOLECYSTECTOMY;  Surgeon: Leighton Ruff, MD;  Location: [facility];  Service: General;  Laterality: N/A;  . ERCP N/A 02/02/2016   Procedure: ENDOSCOPIC RETROGRADE CHOLANGIOPANCREATOGRAPHY (ERCP);  Surgeon: [name], MD;  Location: [facility];  Service: Endoscopy;  Laterality: N/A;  . HERNIA REPAIR    . SPINE SURGERY      MEDICATIONS: . amLODipine (NORVASC) 10 MG tablet  . atorvastatin (LIPITOR) 10 MG tablet  . carvedilol (COREG) 25 MG tablet  . esomeprazole (NEXIUM) 40 MG capsule  . furosemide (LASIX) 40 MG tablet  . lisinopril (PRINIVIL,ZESTRIL) 10 MG tablet  . lisinopril (PRINIVIL,ZESTRIL) 20 MG tablet  . meloxicam (MOBIC) 15 MG tablet  . metFORMIN (GLUCOPHAGE-XR) 500 MG 24 hr tablet  . zolpidem (AMBIEN) 5 MG tablet   No current facility-administered medications for this encounter.    [name] 0.9 %  sodium chloride infusion  . 0.9 %  sodium chloride infusion  . 0.9 %  sodium chloride infusion  . 0.9 %  sodium chloride infusion  . acetaminophen (TYLENOL) tablet 650 mg  . aspirin chewable tablet 81 mg  . ondansetron (ZOFRAN) injection 4 mg  . oxyCODONE (Oxy IR/ROXICODONE) immediate release tablet 5-10 mg  . sodium chloride flush (NS) 0.9 % injection 3 mL  . sodium chloride flush (NS) 0.9 % injection 3 mL  . sodium chloride flush (NS) 0.9 % injection 3 mL  . sodium chloride flush (NS) 0.9 % injection 3 mL      Wynonia Musty [facility] Short Stay Center/Anesthesiology Phone  [phone] 07/23/2018 12:58 PM

## 2018-07-23 NOTE — CV Procedure (Signed)
   Right and left heart cath with coronary angiography performed from the right radial artery and right antecubital vein respectively.  Vascular ultrasound used for arterial access.  25 to 30% mid LAD nonobstructive narrowing.  Right dominant coronary anatomy with all coronaries otherwise widely patent.  Normal right heart pressures.  Pulmonary capillary wedge mean pressure 4 mmHg.  No immediate complications.  Cleared for upcoming knee replacement surgery.  Care should be given to avoid volume overload.  Volume status is currently excellent.

## 2018-07-23 NOTE — Interval H&P Note (Signed)
History and Physical Interval Note:  07/23/2018 Cath Lab Visit (complete for each Cath Lab visit)  Clinical Evaluation Leading to the Procedure:   ACS: No.  Non-ACS:    Anginal Classification: CCS [name]  Anti-ischemic medical therapy: Maximal Therapy (2 or more classes of medications)  Non-Invasive Test Results: No non-invasive testing performed  Prior CABG: No previous CABG       7:15 AM  [name] [name]  has presented today for surgery, with the diagnosis of HF  The various methods of treatment have been discussed with the patient and family. After consideration of risks, benefits and other options for treatment, the patient has consented to  Procedure(s): RIGHT/LEFT HEART CATH AND CORONARY ANGIOGRAPHY (N/A) as a surgical intervention .  The patient's history has been reviewed, patient examined, no change in status, stable for surgery.  I have reviewed the patient's chart and labs.  Questions were answered to the patient's satisfaction.     [name] [name]

## 2018-07-23 NOTE — Anesthesia Preprocedure Evaluation (Addendum)
Anesthesia Evaluation  Patient identified by MRN, date of birth, ID band Patient awake    Reviewed: Allergy & Precautions, H&P , NPO status , Patient's Chart, lab work & pertinent test results, reviewed documented beta blocker date and time   Airway Mallampati: II  TM Distance: >3 FB Neck ROM: Full    Dental no notable dental hx. (+) Teeth Intact, Dental Advisory Given   Pulmonary neg pulmonary ROS,    Pulmonary exam normal breath sounds clear to auscultation       Cardiovascular Exercise Tolerance: Good hypertension, Pt. on medications and Pt. on home beta blockers  Rhythm:Regular Rate:Normal     Neuro/Psych negative neurological ROS  negative psych ROS   GI/Hepatic Neg liver ROS, GERD  Medicated and Controlled,  Endo/Other  diabetes, Type 2, Oral Hypoglycemic AgentsMorbid obesity  Renal/GU negative Renal ROS  negative genitourinary   Musculoskeletal  (+) Arthritis , Osteoarthritis,    Abdominal   Peds  Hematology negative hematology ROS (+)   Anesthesia Other Findings   Reproductive/Obstetrics negative OB ROS                           Anesthesia Physical Anesthesia Plan  ASA: III  Anesthesia Plan: Spinal and MAC   Post-op Pain Management:  Regional for Post-op pain   Induction: Intravenous  PONV Risk Score and Plan: 2 and Propofol infusion, Midazolam and Ondansetron  Airway Management Planned: Simple Face Mask  Additional Equipment:   Intra-op Plan:   Post-operative Plan:   Informed Consent: I have reviewed the patients History and Physical, chart, labs and discussed the procedure including the risks, benefits and alternatives for the proposed anesthesia with the patient or authorized representative who has indicated his/her understanding and acceptance.     Dental advisory given  Plan Discussed with: CRNA  Anesthesia Plan Comments: (See PAT note by Karoline Caldwell,  PA-C )       Anesthesia Quick Evaluation

## 2018-07-23 NOTE — Discharge Instructions (Signed)
Radial Site Care ° °This sheet gives you information about how to care for yourself after your procedure. Your health care provider may also give you more specific instructions. If you have problems or questions, contact your health care provider. °What can I expect after the procedure? °After the procedure, it is common to have: °· Bruising and tenderness at the catheter insertion area. °Follow these instructions at home: °Medicines °· Take over-the-counter and prescription medicines only as told by your health care provider. °Insertion site care °· Follow instructions from your health care provider about how to take care of your insertion site. Make sure you: °? Wash your hands with soap and water before you change your bandage (dressing). If soap and water are not available, use hand sanitizer. °? Change your dressing as told by your health care provider. °? Leave stitches (sutures), skin glue, or adhesive strips in place. These skin closures may need to stay in place for 2 weeks or longer. If adhesive strip edges start to loosen and curl up, you may trim the loose edges. Do not remove adhesive strips completely unless your health care provider tells you to do that. °· Check your insertion site every day for signs of infection. Check for: °? Redness, swelling, or pain. °? Fluid or blood. °? Pus or a bad smell. °? Warmth. °· Do not take baths, swim, or use a hot tub until your health care provider approves. °· You may shower 24-48 hours after the procedure, or as directed by your health care provider. °? Remove the dressing and gently wash the site with plain soap and water. °? Pat the area dry with a clean towel. °? Do not rub the site. That could cause bleeding. °· Do not apply powder or lotion to the site. °Activity ° °· For 24 hours after the procedure, or as directed by your health care provider: °? Do not flex or bend the affected arm. °? Do not push or pull heavy objects with the affected arm. °? Do not  drive yourself home from the hospital or clinic. You may drive 24 hours after the procedure unless your health care provider tells you not to. °? Do not operate machinery or power tools. °· Do not lift anything that is heavier than 10 lb (4.5 kg), or the limit that you are told, until your health care provider says that it is safe. °· Ask your health care provider when it is okay to: °? Return to work or school. °? Resume usual physical activities or sports. °? Resume sexual activity. °General instructions °· If the catheter site starts to bleed, raise your arm and put firm pressure on the site. If the bleeding does not stop, get help right away. This is a medical emergency. °· If you went home on the same day as your procedure, a responsible adult should be with you for the first 24 hours after you arrive home. °· Keep all follow-up visits as told by your health care provider. This is important. °Contact a health care provider if: °· You have a fever. °· You have redness, swelling, or yellow drainage around your insertion site. °Get help right away if: °· You have unusual pain at the radial site. °· The catheter insertion area swells very fast. °· The insertion area is bleeding, and the bleeding does not stop when you hold steady pressure on the area. °· Your arm or hand becomes pale, cool, tingly, or numb. °These symptoms may represent a serious problem   that is an emergency. Do not wait to see if the symptoms will go away. Get medical help right away. Call your local emergency services (911 in the [place] Do not drive yourself to the hospital. °Summary °· After the procedure, it is common to have bruising and tenderness at the site. °· Follow instructions from your health care provider about how to take care of your radial site wound. Check the wound every day for signs of infection. °· Do not lift anything that is heavier than 10 lb (4.5 kg), or the limit that you are told, until your health care provider says  that it is safe. °This information is not intended to replace advice given to you by your health care provider. Make sure you discuss any questions you have with your health care provider. °Document Released: 07/27/2010 Document Revised: 07/30/2017 Document Reviewed: 07/30/2017 °Elsevier Interactive Patient Education © 2019 [employer] ° ° ° °Moderate Conscious Sedation, Adult, Care After °These instructions provide you with information about caring for yourself after your procedure. Your health care provider may also give you more specific instructions. Your treatment has been planned according to current medical practices, but problems sometimes occur. Call your health care provider if you have any problems or questions after your procedure. °What can I expect after the procedure? °After your procedure, it is common: °· To feel sleepy for several hours. °· To feel clumsy and have poor balance for several hours. °· To have poor judgment for several hours. °· To vomit if you eat too soon. °Follow these instructions at home: °For at least 24 hours after the procedure: ° °· Do not: °? Participate in activities where you could fall or become injured. °? Drive. °? Use heavy machinery. °? Drink alcohol. °? Take sleeping pills or medicines that cause drowsiness. °? Make important decisions or sign legal documents. °? Take care of children on your own. °· Rest. °Eating and drinking °· Follow the diet recommended by your health care provider. °· If you vomit: °? Drink water, juice, or soup when you can drink without vomiting. °? Make sure you have little or no nausea before eating solid foods. °General instructions °· Have a responsible adult stay with you until you are awake and alert. °· Take over-the-counter and prescription medicines only as told by your health care provider. °· If you smoke, do not smoke without supervision. °· Keep all follow-up visits as told by your health care provider. This is  important. °Contact a health care provider if: °· You keep feeling nauseous or you keep vomiting. °· You feel light-headed. °· You develop a rash. °· You have a fever. °Get help right away if: °· You have trouble breathing. °This information is not intended to replace advice given to you by your health care provider. Make sure you discuss any questions you have with your health care provider. °Document Released: 04/14/2013 Document Revised: 11/27/2015 Document Reviewed: 10/14/2015 °Elsevier Interactive Patient Education © 2019 [employer] ° °

## 2018-07-24 ENCOUNTER — Other Ambulatory Visit: Payer: Self-pay | Admitting: Orthopaedic Surgery

## 2018-07-27 MED ORDER — TRANEXAMIC ACID 1000 MG/10ML IV SOLN
2000.0000 mg | INTRAVENOUS | Status: AC
  Administered 2018-07-28: 2000 mg via TOPICAL
  Filled 2018-07-27: qty 20

## 2018-07-27 MED ORDER — BUPIVACAINE LIPOSOME 1.3 % IJ SUSP
20.0000 mL | INTRAMUSCULAR | Status: AC
  Administered 2018-07-28: 20 mL
  Filled 2018-07-27: qty 20

## 2018-07-27 MED ORDER — DEXTROSE 5 % IV SOLN
3.0000 g | INTRAVENOUS | Status: AC
  Administered 2018-07-28: 3 g via INTRAVENOUS
  Filled 2018-07-27: qty 3

## 2018-07-27 MED ORDER — TRANEXAMIC ACID-NACL 1000-0.7 MG/100ML-% IV SOLN
1000.0000 mg | INTRAVENOUS | Status: AC
  Administered 2018-07-28: 1000 mg via INTRAVENOUS
  Filled 2018-07-27: qty 100

## 2018-07-27 MED FILL — ZOLPIDEM TARTRATE 5 MG TAB: 5 | 90 days supply | Qty: 90 | Fill #1

## 2018-07-27 NOTE — H&P (Signed)
TOTAL KNEE ADMISSION H&P  Patient is being admitted for left total knee arthroplasty.  Subjective:  Chief Complaint:left knee pain.  HPI: [name] [name], 68 y.o. male, has a history of pain and functional disability in the left knee due to arthritis and has failed non-surgical conservative treatments for greater than 12 weeks to includeNSAID's and/or analgesics, corticosteriod injections, viscosupplementation injections, flexibility and strengthening excercises, use of assistive devices, weight reduction as appropriate and activity modification.  Onset of symptoms was gradual, starting 5 years ago with gradually worsening course since that time. The patient noted no past surgery on the left knee(s).  Patient currently rates pain in the left knee(s) at 10 out of 10 with activity. Patient has night pain, worsening of pain with activity and weight bearing, pain that interferes with activities of daily living, crepitus and joint swelling.  Patient has evidence of subchondral cysts, subchondral sclerosis, periarticular osteophytes and joint space narrowing by imaging studies. There is no active infection.  Patient Active Problem List   Diagnosis Date Noted  . Chronic systolic heart failure ([place]) 07/15/2018  . Abdominal aortic atherosclerosis ([place]) 01/14/2018  . PVC's (premature ventricular contractions) 10/05/2017  . Hyperlipidemia associated with type 2 diabetes mellitus ([place]) 10/05/2017  . BMI 40.0-44.9, adult ([place]) 10/05/2017  . History of gout 07/04/2013  . Low serum testosterone level 07/04/2013  . GE reflux 07/06/2012  . Type 2 diabetes mellitus with complication, without long-term current use of insulin ([place]) 03/09/2012  . Osteoarthritis of both knees 03/09/2012  . Obesity 03/09/2012  . Erectile dysfunction 03/02/2012  . Hypertension 03/02/2012   Past Medical History:  Diagnosis Date  . Arthritis   . Diabetes mellitus without complication ([place])   . Hypertension     Past Surgical  History:  Procedure Laterality Date  . CHOLECYSTECTOMY N/A 02/04/2016   Procedure: LAPAROSCOPIC CHOLECYSTECTOMY;  Surgeon: Leighton Ruff, MD;  Location: [facility];  Service: General;  Laterality: N/A;  . ERCP N/A 02/02/2016   Procedure: ENDOSCOPIC RETROGRADE CHOLANGIOPANCREATOGRAPHY (ERCP);  Surgeon: [name], MD;  Location: [facility];  Service: Endoscopy;  Laterality: N/A;  . HERNIA REPAIR    . RIGHT/LEFT HEART CATH AND CORONARY ANGIOGRAPHY N/A 07/23/2018   Procedure: RIGHT/LEFT HEART CATH AND CORONARY ANGIOGRAPHY;  Surgeon: Belva Crome, MD;  Location: [facility];  Service: Cardiovascular;  Laterality: N/A;  . SPINE SURGERY      Current Facility-Administered Medications  Medication Dose Route Frequency Provider Last Rate Last Dose  . [START ON 07/28/2018] bupivacaine liposome (EXPAREL) 1.3 % injection 266 mg  20 mL Infiltration To OR Melrose Nakayama, MD      . Derrill Memo ON 07/28/2018] ceFAZolin (ANCEF) 3 g in dextrose 5 % 50 mL IVPB  3 g Intravenous To Anson Fret, MD      . Derrill Memo ON 07/28/2018] tranexamic acid (CYKLOKAPRON) 2,000 mg in sodium chloride 0.9 % 50 mL Topical Application  0,037 mg Topical To OR Melrose Nakayama, MD      . Derrill Memo ON 07/28/2018] tranexamic acid (CYKLOKAPRON) IVPB 1,000 mg  1,000 mg Intravenous To OR Melrose Nakayama, MD       Current Outpatient Medications  Medication Sig Dispense Refill Last Dose  . amLODipine (NORVASC) 10 MG tablet Take 1 tablet (10 mg total) by mouth daily. 90 tablet 3 07/23/2018 at am  . atorvastatin (LIPITOR) 10 MG tablet TAKE 1 TABLET BY MOUTH DAILY. (Patient taking differently: Take 10 mg by mouth every evening. ) 90 tablet 3 07/22/2018 at Unknown  time  . carvedilol (COREG) 25 MG tablet Take 1 tablet (25 mg total) by mouth 2 (two) times daily. 180 tablet 3 07/23/2018 at am  . esomeprazole (NEXIUM) 40 MG capsule Take 1 capsule (40 mg total) by mouth daily. 90 capsule 4 07/22/2018 at Unknown time  . furosemide (LASIX) 40 MG  tablet TAKE ONE TABLET BY MOUTH DAILY (Patient taking differently: Take 40 mg by mouth daily. Take one tablet by mouth daily) 90 tablet 3 07/22/2018 at Unknown time  . lisinopril (PRINIVIL,ZESTRIL) 10 MG tablet Take 10 mg by mouth daily.   07/23/2018 at Unknown time  . meloxicam (MOBIC) 15 MG tablet TAKE 1 TABLET BY MOUTH ONCE DAILY (Patient taking differently: Take 15 mg by mouth daily. ) 90 tablet 1 07/22/2018 at Unknown time  . metFORMIN (GLUCOPHAGE-XR) 500 MG 24 hr tablet Take 1 tablet (500 mg total) by mouth 2 (two) times daily. 180 tablet 3 07/22/2018 at Unknown time  . zolpidem (AMBIEN) 5 MG tablet Take 1 tablet (5 mg total) by mouth at bedtime as needed for sleep. 90 tablet 1 prn  . lisinopril (PRINIVIL,ZESTRIL) 20 MG tablet Take 1 tablet (20 mg total) by mouth daily. 90 tablet 3 07/23/2018 at Unknown time   No Known Allergies  Social History   Tobacco Use  . Smoking status: Never Smoker  . Smokeless tobacco: Never Used  Substance Use Topics  . Alcohol use: No    Family History  Problem Relation Age of Onset  . Stroke Mother   . Hypertension Mother      Review of Systems  Musculoskeletal: Positive for joint pain.       Left knee  All other systems reviewed and are negative.   Objective:  Physical Exam  Constitutional: He is oriented to person, place, and time. He appears well-developed and well-nourished.  HENT:  Head: Normocephalic and atraumatic.  Eyes: Pupils are equal, round, and reactive to light.  Neck: Normal range of motion.  Respiratory: Effort normal.  GI: Soft.  Musculoskeletal:     Comments: Both knees move about 0-95.  He has medial greater than lateral joint line pain which is worse on the left.  I do not feel an effusion on either side.  He does have crepitation.  Hip motion is full and straight leg raise is negative.  Sensation and motor function are intact in his feet with palpable pulses on both sides.   Neurological: He is alert and oriented to person,  place, and time.  Skin: Skin is warm and dry.  Psychiatric: He has a normal mood and affect. His behavior is normal. Judgment and thought content normal.    Vital signs in last 24 hours:    Labs:   Estimated body mass index is 40.03 kg/m as calculated from the following:   Height as of 07/23/18: 5\' 10"  (1.778 m).   Weight as of 07/23/18: 126.6 kg.   Imaging Review Plain radiographs demonstrate severe degenerative joint disease of the left knee(s). The overall alignment isneutral. The bone quality appears to be good for age and reported activity level.   Preoperative templating of the joint replacement has been completed, documented, and submitted to the Operating Room personnel in order to optimize intra-operative equipment management.    Patient's anticipated LOS is less than 2 midnights, meeting these requirements: - Younger than 81 - Lives within 1 hour of care - Has a competent adult at home to recover with post-op recover - NO history of  -  Chronic pain requiring opiods  - Diabetes  - Coronary Artery Disease  - Heart failure  - Heart attack  - Stroke  - DVT/VTE  - Cardiac arrhythmia  - Respiratory Failure/COPD  - Renal failure  - Anemia  - Advanced Liver disease    Assessment/Plan:  End stage primary arthritis, left knee   The patient history, physical examination, clinical judgment of the provider and imaging studies are consistent with end stage degenerative joint disease of the left knee(s) and total knee arthroplasty is deemed medically necessary. The treatment options including medical management, injection therapy arthroscopy and arthroplasty were discussed at length. The risks and benefits of total knee arthroplasty were presented and reviewed. The risks due to aseptic loosening, infection, stiffness, patella tracking problems, thromboembolic complications and other imponderables were discussed. The patient acknowledged the explanation, agreed to proceed  with the plan and consent was signed. Patient is being admitted for inpatient treatment for surgery, pain control, PT, OT, prophylactic antibiotics, VTE prophylaxis, progressive ambulation and ADL's and discharge planning. The patient is planning to be discharged home with home health services

## 2018-07-28 ENCOUNTER — Ambulatory Visit (HOSPITAL_COMMUNITY): Payer: 59 | Admitting: Physician Assistant

## 2018-07-28 ENCOUNTER — Other Ambulatory Visit: Payer: Self-pay

## 2018-07-28 ENCOUNTER — Observation Stay (HOSPITAL_COMMUNITY)
Admission: RE | Admit: 2018-07-28 | Discharge: 2018-07-29 | Disposition: A | Discharge status: Home / Self Care | Payer: 59 | Source: Ambulatory Visit | Attending: Orthopaedic Surgery | Admitting: Orthopaedic Surgery

## 2018-07-28 ENCOUNTER — Encounter (HOSPITAL_COMMUNITY)
Admission: RE | Disposition: A | Discharge status: Home / Self Care | Payer: Self-pay | Source: Ambulatory Visit | Attending: Orthopaedic Surgery

## 2018-07-28 ENCOUNTER — Encounter (HOSPITAL_COMMUNITY): Payer: Self-pay

## 2018-07-28 ENCOUNTER — Ambulatory Visit (HOSPITAL_COMMUNITY): Payer: 59 | Admitting: Certified Registered Nurse Anesthetist

## 2018-07-28 DIAGNOSIS — E785 Hyperlipidemia, unspecified: Secondary | ICD-10-CM | POA: Insufficient documentation

## 2018-07-28 DIAGNOSIS — Z791 Long term (current) use of non-steroidal anti-inflammatories (NSAID): Secondary | ICD-10-CM | POA: Diagnosis not present

## 2018-07-28 DIAGNOSIS — I110 Hypertensive heart disease with heart failure: Secondary | ICD-10-CM | POA: Insufficient documentation

## 2018-07-28 DIAGNOSIS — I10 Essential (primary) hypertension: Secondary | ICD-10-CM | POA: Diagnosis not present

## 2018-07-28 DIAGNOSIS — Z823 Family history of stroke: Secondary | ICD-10-CM | POA: Diagnosis not present

## 2018-07-28 DIAGNOSIS — K219 Gastro-esophageal reflux disease without esophagitis: Secondary | ICD-10-CM | POA: Insufficient documentation

## 2018-07-28 DIAGNOSIS — I5022 Chronic systolic (congestive) heart failure: Secondary | ICD-10-CM | POA: Insufficient documentation

## 2018-07-28 DIAGNOSIS — E119 Type 2 diabetes mellitus without complications: Secondary | ICD-10-CM | POA: Diagnosis not present

## 2018-07-28 DIAGNOSIS — Z6841 Body Mass Index (BMI) 40.0 and over, adult: Secondary | ICD-10-CM | POA: Insufficient documentation

## 2018-07-28 DIAGNOSIS — E118 Type 2 diabetes mellitus with unspecified complications: Secondary | ICD-10-CM | POA: Diagnosis present

## 2018-07-28 DIAGNOSIS — M1712 Unilateral primary osteoarthritis, left knee: Principal | ICD-10-CM | POA: Diagnosis present

## 2018-07-28 DIAGNOSIS — Z79899 Other long term (current) drug therapy: Secondary | ICD-10-CM | POA: Diagnosis not present

## 2018-07-28 DIAGNOSIS — G8918 Other acute postprocedural pain: Secondary | ICD-10-CM | POA: Diagnosis not present

## 2018-07-28 DIAGNOSIS — Z8249 Family history of ischemic heart disease and other diseases of the circulatory system: Secondary | ICD-10-CM | POA: Insufficient documentation

## 2018-07-28 DIAGNOSIS — I700 Atherosclerosis of aorta: Secondary | ICD-10-CM | POA: Diagnosis not present

## 2018-07-28 DIAGNOSIS — Z7984 Long term (current) use of oral hypoglycemic drugs: Secondary | ICD-10-CM | POA: Insufficient documentation

## 2018-07-28 HISTORY — DX: Unilateral primary osteoarthritis, unspecified knee: M17.10

## 2018-07-28 HISTORY — PX: TOTAL KNEE ARTHROPLASTY: SHX125

## 2018-07-28 HISTORY — DX: Osteoarthritis of knee, unspecified: M17.9

## 2018-07-28 LAB — GLUCOSE, CAPILLARY
GLUCOSE-CAPILLARY: 146 mg/dL — AB (ref 70–99)
Glucose-Capillary: 118 mg/dL — ABNORMAL HIGH (ref 70–99)
Glucose-Capillary: 103 mg/dL — ABNORMAL HIGH (ref 70–99)
Glucose-Capillary: 100 mg/dL — ABNORMAL HIGH (ref 70–99)
Glucose-Capillary: 98 mg/dL (ref 70–99)

## 2018-07-28 SURGERY — ARTHROPLASTY, KNEE, TOTAL
Anesthesia: Monitor Anesthesia Care | Laterality: Left

## 2018-07-28 MED ORDER — MIDAZOLAM HCL 2 MG/2ML IJ SOLN
INTRAMUSCULAR | Status: DC | PRN
  Administered 2018-07-28: 2 mg via INTRAVENOUS

## 2018-07-28 MED ORDER — SODIUM CHLORIDE 0.9 % IR SOLN
Status: DC | PRN
  Administered 2018-07-28: 3000 mL

## 2018-07-28 MED ORDER — ZOLPIDEM TARTRATE 5 MG PO TABS: 5.0000 mg | ORAL_TABLET | Freq: Every evening | ORAL | Status: DC | PRN

## 2018-07-28 MED ORDER — HYDROCODONE-ACETAMINOPHEN 5-325 MG PO TABS
1.0000 | ORAL_TABLET | ORAL | Status: DC | PRN
  Administered 2018-07-28: 1 via ORAL
  Administered 2018-07-29: 2 via ORAL
  Filled 2018-07-28: qty 1
  Filled 2018-07-28: qty 2

## 2018-07-28 MED ORDER — FENTANYL CITRATE (PF) 250 MCG/5ML IJ SOLN
INTRAMUSCULAR | Status: AC
  Filled 2018-07-28: qty 5

## 2018-07-28 MED ORDER — KETOROLAC TROMETHAMINE 15 MG/ML IJ SOLN
7.5000 mg | Freq: Four times a day (QID) | INTRAMUSCULAR | Status: AC
  Administered 2018-07-28 – 2018-07-29 (×4): 7.5 mg via INTRAVENOUS
  Filled 2018-07-28 (×4): qty 1

## 2018-07-28 MED ORDER — TRANEXAMIC ACID-NACL 1000-0.7 MG/100ML-% IV SOLN
1000.0000 mg | Freq: Once | INTRAVENOUS | Status: AC
  Administered 2018-07-28: 1000 mg via INTRAVENOUS
  Filled 2018-07-28: qty 100

## 2018-07-28 MED ORDER — CARVEDILOL 25 MG PO TABS: 25.0000 mg | ORAL_TABLET | Freq: Two times a day (BID) | ORAL | Status: DC

## 2018-07-28 MED ORDER — PROPOFOL 500 MG/50ML IV EMUL
INTRAVENOUS | Status: DC | PRN
  Administered 2018-07-28: 75 ug/kg/min via INTRAVENOUS

## 2018-07-28 MED ORDER — PROPOFOL 10 MG/ML IV BOLUS
INTRAVENOUS | Status: AC
  Filled 2018-07-28: qty 20

## 2018-07-28 MED ORDER — HYDROCODONE-ACETAMINOPHEN 7.5-325 MG PO TABS
1.0000 | ORAL_TABLET | ORAL | Status: DC | PRN
  Administered 2018-07-28: 2 via ORAL
  Administered 2018-07-29: 1 via ORAL
  Filled 2018-07-28: qty 2
  Filled 2018-07-28: qty 1

## 2018-07-28 MED ORDER — LIDOCAINE 2% (20 MG/ML) 5 ML SYRINGE
INTRAMUSCULAR | Status: AC
  Filled 2018-07-28: qty 5

## 2018-07-28 MED ORDER — BUPIVACAINE IN DEXTROSE 0.75-8.25 % IT SOLN
INTRATHECAL | Status: DC | PRN
  Administered 2018-07-28: 13.5 mg via INTRATHECAL

## 2018-07-28 MED ORDER — ASPIRIN EC 325 MG PO TBEC
325.0000 mg | DELAYED_RELEASE_TABLET | Freq: Two times a day (BID) | ORAL | Status: DC
  Administered 2018-07-29: 325 mg via ORAL
  Filled 2018-07-28: qty 1

## 2018-07-28 MED ORDER — GLYCOPYRROLATE 0.2 MG/ML IJ SOLN
INTRAMUSCULAR | Status: DC | PRN
  Administered 2018-07-28: 0.2 mg via INTRAVENOUS

## 2018-07-28 MED ORDER — LACTATED RINGERS IV SOLN
INTRAVENOUS | Status: DC
  Administered 2018-07-28 (×2): via INTRAVENOUS

## 2018-07-28 MED ORDER — ACETAMINOPHEN 500 MG PO TABS
ORAL_TABLET | ORAL | Status: AC
  Administered 2018-07-28: 1000 mg via ORAL
  Filled 2018-07-28: qty 2

## 2018-07-28 MED ORDER — BUPIVACAINE-EPINEPHRINE 0.25% -1:200000 IJ SOLN
INTRAMUSCULAR | Status: DC | PRN
  Administered 2018-07-28: 30 mL

## 2018-07-28 MED ORDER — PANTOPRAZOLE SODIUM 40 MG PO TBEC
80.0000 mg | DELAYED_RELEASE_TABLET | Freq: Every day | ORAL | Status: DC
  Administered 2018-07-29: 80 mg via ORAL
  Filled 2018-07-28: qty 2

## 2018-07-28 MED ORDER — BISACODYL 5 MG PO TBEC: 5.0000 mg | DELAYED_RELEASE_TABLET | Freq: Every day | ORAL | Status: DC | PRN

## 2018-07-28 MED ORDER — MENTHOL 3 MG MT LOZG: 1.0000 | LOZENGE | OROMUCOSAL | Status: DC | PRN

## 2018-07-28 MED ORDER — SODIUM CHLORIDE 0.9% FLUSH
INTRAVENOUS | Status: DC | PRN
  Administered 2018-07-28: 30 mL

## 2018-07-28 MED ORDER — ALUM & MAG HYDROXIDE-SIMETH 200-200-20 MG/5ML PO SUSP: 30.0000 mL | ORAL | Status: DC | PRN

## 2018-07-28 MED ORDER — MORPHINE SULFATE (PF) 2 MG/ML IV SOLN
0.5000 mg | INTRAVENOUS | Status: DC | PRN
  Administered 2018-07-28: 1 mg via INTRAVENOUS
  Filled 2018-07-28: qty 1

## 2018-07-28 MED ORDER — LISINOPRIL 10 MG PO TABS
10.0000 mg | ORAL_TABLET | Freq: Every day | ORAL | Status: DC
  Administered 2018-07-28: 10 mg via ORAL
  Filled 2018-07-28 (×2): qty 1

## 2018-07-28 MED ORDER — ATORVASTATIN CALCIUM 10 MG PO TABS
10.0000 mg | ORAL_TABLET | Freq: Every evening | ORAL | Status: DC
  Administered 2018-07-28: 10 mg via ORAL
  Filled 2018-07-28: qty 1

## 2018-07-28 MED ORDER — LACTATED RINGERS IV SOLN
INTRAVENOUS | Status: DC
  Administered 2018-07-28 (×2): via INTRAVENOUS

## 2018-07-28 MED ORDER — LIDOCAINE HCL (CARDIAC) PF 100 MG/5ML IV SOSY
PREFILLED_SYRINGE | INTRAVENOUS | Status: DC | PRN
  Administered 2018-07-28: 100 mg via INTRAVENOUS

## 2018-07-28 MED ORDER — ACETAMINOPHEN 500 MG PO TABS
500.0000 mg | ORAL_TABLET | Freq: Four times a day (QID) | ORAL | Status: AC
  Administered 2018-07-28 – 2018-07-29 (×4): 500 mg via ORAL
  Filled 2018-07-28 (×4): qty 1

## 2018-07-28 MED ORDER — SODIUM CHLORIDE 0.9 % IV SOLN
INTRAVENOUS | Status: DC | PRN
  Administered 2018-07-28: 20 ug/min via INTRAVENOUS

## 2018-07-28 MED ORDER — 0.9 % SODIUM CHLORIDE (POUR BTL) OPTIME
TOPICAL | Status: DC | PRN
  Administered 2018-07-28: 1000 mL

## 2018-07-28 MED ORDER — ACETAMINOPHEN 325 MG PO TABS: 325.0000 mg | ORAL_TABLET | Freq: Four times a day (QID) | ORAL | Status: DC | PRN

## 2018-07-28 MED ORDER — HYDROMORPHONE HCL 1 MG/ML IJ SOLN: 0.2500 mg | INTRAMUSCULAR | Status: DC | PRN

## 2018-07-28 MED ORDER — GLYCOPYRROLATE PF 0.2 MG/ML IJ SOSY
PREFILLED_SYRINGE | INTRAMUSCULAR | Status: AC
  Filled 2018-07-28: qty 1

## 2018-07-28 MED ORDER — DIPHENHYDRAMINE HCL 12.5 MG/5ML PO ELIX: 12.5000 mg | ORAL_SOLUTION | ORAL | Status: DC | PRN

## 2018-07-28 MED ORDER — METOCLOPRAMIDE HCL 5 MG/ML IJ SOLN: 5.0000 mg | Freq: Three times a day (TID) | INTRAMUSCULAR | Status: DC | PRN

## 2018-07-28 MED ORDER — CARVEDILOL 25 MG PO TABS
25.0000 mg | ORAL_TABLET | Freq: Two times a day (BID) | ORAL | Status: DC
  Administered 2018-07-28 – 2018-07-29 (×2): 25 mg via ORAL
  Filled 2018-07-28 (×2): qty 1

## 2018-07-28 MED ORDER — METFORMIN HCL ER 500 MG PO TB24
500.0000 mg | ORAL_TABLET | Freq: Two times a day (BID) | ORAL | Status: DC
  Administered 2018-07-28 – 2018-07-29 (×2): 500 mg via ORAL
  Filled 2018-07-28 (×2): qty 1

## 2018-07-28 MED ORDER — AMLODIPINE BESYLATE 10 MG PO TABS
10.0000 mg | ORAL_TABLET | Freq: Every day | ORAL | Status: DC
  Filled 2018-07-28: qty 1

## 2018-07-28 MED ORDER — PROPOFOL 10 MG/ML IV BOLUS
INTRAVENOUS | Status: DC | PRN
  Administered 2018-07-28: 20 mg via INTRAVENOUS

## 2018-07-28 MED ORDER — FUROSEMIDE 40 MG PO TABS
40.0000 mg | ORAL_TABLET | Freq: Every day | ORAL | Status: DC
  Administered 2018-07-28: 40 mg via ORAL
  Filled 2018-07-28 (×2): qty 1

## 2018-07-28 MED ORDER — ONDANSETRON HCL 4 MG PO TABS: 4.0000 mg | ORAL_TABLET | Freq: Four times a day (QID) | ORAL | Status: DC | PRN

## 2018-07-28 MED ORDER — FENTANYL CITRATE (PF) 100 MCG/2ML IJ SOLN
INTRAMUSCULAR | Status: DC | PRN
  Administered 2018-07-28: 50 ug via INTRAVENOUS

## 2018-07-28 MED ORDER — DEXTROSE 5 % IV SOLN
3.0000 g | Freq: Four times a day (QID) | INTRAVENOUS | Status: AC
  Administered 2018-07-28 (×2): 3 g via INTRAVENOUS
  Filled 2018-07-28 (×2): qty 3

## 2018-07-28 MED ORDER — METOCLOPRAMIDE HCL 5 MG PO TABS: 5.0000 mg | ORAL_TABLET | Freq: Three times a day (TID) | ORAL | Status: DC | PRN

## 2018-07-28 MED ORDER — TRANEXAMIC ACID-NACL 1000-0.7 MG/100ML-% IV SOLN
INTRAVENOUS | Status: AC
  Filled 2018-07-28: qty 100

## 2018-07-28 MED ORDER — METHOCARBAMOL 500 MG PO TABS
500.0000 mg | ORAL_TABLET | Freq: Four times a day (QID) | ORAL | Status: DC | PRN
  Administered 2018-07-28: 500 mg via ORAL
  Filled 2018-07-28: qty 1

## 2018-07-28 MED ORDER — INSULIN ASPART 100 UNIT/ML ~~LOC~~ SOLN
0.0000 [IU] | Freq: Three times a day (TID) | SUBCUTANEOUS | Status: DC
  Administered 2018-07-28 – 2018-07-29 (×2): 3 [IU] via SUBCUTANEOUS

## 2018-07-28 MED ORDER — BUPIVACAINE-EPINEPHRINE (PF) 0.5% -1:200000 IJ SOLN
INTRAMUSCULAR | Status: DC | PRN
  Administered 2018-07-28: 20 mL via PERINEURAL

## 2018-07-28 MED ORDER — MIDAZOLAM HCL 2 MG/2ML IJ SOLN
INTRAMUSCULAR | Status: AC
  Filled 2018-07-28: qty 2

## 2018-07-28 MED ORDER — DOCUSATE SODIUM 100 MG PO CAPS
100.0000 mg | ORAL_CAPSULE | Freq: Two times a day (BID) | ORAL | Status: DC
  Administered 2018-07-28 – 2018-07-29 (×2): 100 mg via ORAL
  Filled 2018-07-28 (×2): qty 1

## 2018-07-28 MED ORDER — PHENOL 1.4 % MT LIQD: 1.0000 | OROMUCOSAL | Status: DC | PRN

## 2018-07-28 MED ORDER — METHOCARBAMOL 1000 MG/10ML IJ SOLN
500.0000 mg | Freq: Four times a day (QID) | INTRAVENOUS | Status: DC | PRN
  Filled 2018-07-28: qty 5

## 2018-07-28 MED ORDER — ONDANSETRON HCL 4 MG/2ML IJ SOLN: 4.0000 mg | Freq: Four times a day (QID) | INTRAMUSCULAR | Status: DC | PRN

## 2018-07-28 MED ORDER — PHENYLEPHRINE 40 MCG/ML (10ML) SYRINGE FOR IV PUSH (FOR BLOOD PRESSURE SUPPORT)
PREFILLED_SYRINGE | INTRAVENOUS | Status: AC
  Filled 2018-07-28: qty 10

## 2018-07-28 MED ORDER — ACETAMINOPHEN 500 MG PO TABS
1000.0000 mg | ORAL_TABLET | Freq: Once | ORAL | Status: AC
  Administered 2018-07-28: 1000 mg via ORAL

## 2018-07-28 MED ORDER — CHLORHEXIDINE GLUCONATE 4 % EX LIQD: 60.0000 mL | Freq: Once | CUTANEOUS | Status: DC

## 2018-07-28 MED ORDER — BUPIVACAINE-EPINEPHRINE (PF) 0.25% -1:200000 IJ SOLN
INTRAMUSCULAR | Status: AC
  Filled 2018-07-28: qty 30

## 2018-07-28 SURGICAL SUPPLY — 58 items
ATTUNE MED DOME PAT 41 KNEE (Knees) ×2 IMPLANT
ATTUNE PS FEM LT SZ 6 CEM KNEE (Femur) ×2 IMPLANT
ATTUNE PSRP INSR SZ6 10 KNEE (Insert) ×2 IMPLANT
BAG DECANTER FOR FLEXI CONT (MISCELLANEOUS) IMPLANT
BANDAGE ESMARK 6X9 LF (GAUZE/BANDAGES/DRESSINGS) ×1 IMPLANT
BASE TIBIAL ROT PLAT SZ 8 KNEE (Knees) ×1 IMPLANT
BLADE SAGITTAL 25.0X1.19X90 (BLADE) ×4 IMPLANT
BLADE SAW SGTL 13.0X1.19X90.0M (BLADE) ×2 IMPLANT
BNDG ELASTIC 6X10 VLCR STRL LF (GAUZE/BANDAGES/DRESSINGS) ×2 IMPLANT
BNDG ELASTIC 6X15 VLCR STRL LF (GAUZE/BANDAGES/DRESSINGS) ×2 IMPLANT
BNDG ESMARK 6X9 LF (GAUZE/BANDAGES/DRESSINGS) ×2
BOWL SMART MIX CTS (DISPOSABLE) ×2 IMPLANT
CEMENT HV SMART SET (Cement) ×4 IMPLANT
COVER SURGICAL LIGHT HANDLE (MISCELLANEOUS) ×2 IMPLANT
COVER WAND RF STERILE (DRAPES) ×2 IMPLANT
CUFF TOURNIQUET SINGLE 34IN LL (TOURNIQUET CUFF) ×2 IMPLANT
CUFF TOURNIQUET SINGLE 44IN (TOURNIQUET CUFF) IMPLANT
DECANTER SPIKE VIAL GLASS SM (MISCELLANEOUS) ×2 IMPLANT
DRAPE EXTREMITY T 121X128X90 (DISPOSABLE) ×2 IMPLANT
DRAPE HALF SHEET 40X57 (DRAPES) ×4 IMPLANT
DRAPE U-SHAPE 47X51 STRL (DRAPES) ×2 IMPLANT
DRSG AQUACEL AG ADV 3.5X10 (GAUZE/BANDAGES/DRESSINGS) ×2 IMPLANT
DURAPREP 26ML APPLICATOR (WOUND CARE) ×2 IMPLANT
ELECT REM PT RETURN 9FT ADLT (ELECTROSURGICAL) ×2
ELECTRODE REM PT RTRN 9FT ADLT (ELECTROSURGICAL) ×1 IMPLANT
GLOVE BIO SURGEON STRL SZ 6.5 (GLOVE) ×4 IMPLANT
GLOVE BIO SURGEON STRL SZ8 (GLOVE) ×4 IMPLANT
GLOVE BIOGEL PI IND STRL 7.0 (GLOVE) ×2 IMPLANT
GLOVE BIOGEL PI IND STRL 8 (GLOVE) ×2 IMPLANT
GLOVE BIOGEL PI INDICATOR 7.0 (GLOVE) ×2
GLOVE BIOGEL PI INDICATOR 8 (GLOVE) ×2
GOWN STRL REUS W/ TWL LRG LVL3 (GOWN DISPOSABLE) ×2 IMPLANT
GOWN STRL REUS W/ TWL XL LVL3 (GOWN DISPOSABLE) ×2 IMPLANT
GOWN STRL REUS W/TWL LRG LVL3 (GOWN DISPOSABLE) ×2
GOWN STRL REUS W/TWL XL LVL3 (GOWN DISPOSABLE) ×2
HANDPIECE INTERPULSE COAX TIP (DISPOSABLE) ×1
HOOD PEEL AWAY FACE SHEILD DIS (HOOD) ×6 IMPLANT
KIT BASIN OR (CUSTOM PROCEDURE TRAY) ×2 IMPLANT
KIT TURNOVER KIT B (KITS) ×2 IMPLANT
MANIFOLD NEPTUNE II (INSTRUMENTS) ×2 IMPLANT
NEEDLE HYPO 21X1 ECLIPSE (NEEDLE) ×2 IMPLANT
NS IRRIG 1000ML POUR BTL (IV SOLUTION) ×2 IMPLANT
PACK TOTAL JOINT (CUSTOM PROCEDURE TRAY) ×2 IMPLANT
PAD ARMBOARD 7.5X6 YLW CONV (MISCELLANEOUS) ×4 IMPLANT
PIN STEINMAN FIXATION KNEE (PIN) ×2 IMPLANT
PIN THREADED HEADED SIGMA (PIN) ×2 IMPLANT
SET HNDPC FAN SPRY TIP SCT (DISPOSABLE) ×1 IMPLANT
SUT VIC AB 0 CT1 27 (SUTURE) ×1
SUT VIC AB 0 CT1 27XBRD ANBCTR (SUTURE) ×1 IMPLANT
SUT VIC AB 2-0 CT1 27 (SUTURE) ×1
SUT VIC AB 2-0 CT1 TAPERPNT 27 (SUTURE) ×1 IMPLANT
SUT VIC AB 3-0 FS2 27 (SUTURE) ×2 IMPLANT
SUT VLOC 180 0 24IN GS25 (SUTURE) ×2 IMPLANT
SYR 50ML LL SCALE MARK (SYRINGE) ×2 IMPLANT
TIBIAL BASE ROT PLAT SZ 8 KNEE (Knees) ×2 IMPLANT
TOWEL OR 17X24 6PK STRL BLUE (TOWEL DISPOSABLE) ×2 IMPLANT
TOWEL OR 17X26 10 PK STRL BLUE (TOWEL DISPOSABLE) ×2 IMPLANT
TRAY CATH 16FR W/PLASTIC CATH (SET/KITS/TRAYS/PACK) IMPLANT

## 2018-07-28 NOTE — Progress Notes (Signed)
Physical Therapy Evaluation Patient Details Name: [name] [name] MRN: [id number] DOB: 09/07/1950 Today's Date: 07/28/2018   History of Present Illness  Patient is 68 y/o male s/p L TKA on 1/21. PMH is CHF, HLD, DMII, HTN, obesity, and hx of R/L heart cath and coronary angiography.   Clinical Impression  Patient presents secondary to above problems with deficits below. Patient tolerated treatment well with ability to ambulate in hallway requiring min guard to supervision with RW. Reviewed precautions and HEP with patient. Patient will continue to benefit from acute physical therapy services to maximize independence and safety with functional mobility.     Follow Up Recommendations Follow surgeon's recommendation for DC plan and follow-up therapies    Equipment Recommendations  None recommended by PT    Recommendations for Other Services       Precautions / Restrictions Precautions Precautions: Fall;Knee Precaution Booklet Issued: Yes (comment) Precaution Comments: reviewed knee precautions with patient and patient's wife Restrictions Weight Bearing Restrictions: Yes LLE Weight Bearing: Weight bearing as tolerated      Mobility  Bed Mobility Overal bed mobility: Modified Independent             General bed mobility comments: patient required increased time for bed mobility   Transfers Overall transfer level: Needs assistance Equipment used: Rolling walker (2 wheeled) Transfers: Sit to/from Stand Sit to Stand: Min guard;From elevated surface         General transfer comment: Patient required min guard for safety with RW and elevated bed when standing. Verbal cues for hand placement before standing with RW  Ambulation/Gait Ambulation/Gait assistance: Supervision;Min guard Gait Distance (Feet): 75 Feet Assistive device: Rolling walker (2 wheeled) Gait Pattern/deviations: Step-to pattern;Decreased step length - right;Decreased stance time - left;Decreased weight  shift to left;Antalgic Gait velocity: decreased Gait velocity interpretation: <1.8 ft/sec, indicate of risk for recurrent falls General Gait Details: Patient ambulated with supervision to min guard with RW for safety.  Verbal cues for heel-toe gait pattern  Stairs            Wheelchair Mobility    Modified Rankin (Stroke Patients Only)       Balance Overall balance assessment: Needs assistance Sitting-balance support: Feet unsupported;No upper extremity supported Sitting balance-Leahy Scale: Good     Standing balance support: Bilateral upper extremity supported Standing balance-Leahy Scale: Poor Standing balance comment: reliant on BUE and RW for standing balance                             Pertinent Vitals/Pain Pain Assessment: 0-10 Pain Score: 2  Pain Location: L knee Pain Descriptors / Indicators: Tingling;Sore Pain Intervention(s): Limited activity within patient's tolerance;Monitored during session;RN gave pain meds during session    [place] expects to be discharged to:: Private residence Living Arrangements: Spouse/significant other(lives with wife) Available Help at Discharge: Family;Available 24 hours/day Type of Home: House Home Access: Stairs to enter Entrance Stairs-Rails: None Entrance Stairs-Number of Steps: 1(threshold step) Home Layout: Two level Home Equipment: Walker - 2 wheels;Cane - single point;Crutches;Tub bench;Toilet riser Additional Comments: Patient reports he plans only going up/down flight of steps x1 per day.     Prior Function Level of Independence: Independent               Hand Dominance        Extremity/Trunk Assessment   Upper Extremity Assessment Upper Extremity Assessment: Overall WFL for tasks assessed    Lower Extremity Assessment  Lower Extremity Assessment: LLE deficits/detail LLE Deficits / Details: deficits consistent with post op pain and weakness    Cervical / Trunk  Assessment Cervical / Trunk Assessment: Normal  Communication   Communication: No difficulties  Cognition Arousal/Alertness: Awake/alert Behavior During Therapy: WFL for tasks assessed/performed Overall Cognitive Status: Within Functional Limits for tasks assessed                                        General Comments General comments (skin integrity, edema, etc.): Patient wife in room for session    Exercises Total Joint Exercises Ankle Circles/Pumps: AROM;Both;20 reps;Supine Quad Sets: AROM;Left;10 reps;Supine Heel Slides: AROM;Left;10 reps;Supine   Assessment/Plan    PT Assessment Patient needs continued PT services  PT Problem List Decreased strength;Decreased range of motion;Decreased activity tolerance;Decreased balance;Decreased mobility;Decreased knowledge of use of DME;Decreased knowledge of precautions;Pain       PT Treatment Interventions DME instruction;Gait training;Stair training;Functional mobility training;Therapeutic activities;Therapeutic exercise;Balance training;Patient/family education    PT Goals (Current goals can be found in the Care Plan section)  Acute Rehab PT Goals Patient Stated Goal: go home PT Goal Formulation: With patient Time For Goal Achievement: 08/11/18 Potential to Achieve Goals: Good    Frequency 7X/week   Barriers to discharge        Co-evaluation               AM-PAC PT "6 Clicks" Mobility  Outcome Measure Help needed turning from your back to your side while in a flat bed without using bedrails?: None Help needed moving from lying on your back to sitting on the side of a flat bed without using bedrails?: None Help needed moving to and from a bed to a chair (including a wheelchair)?: None Help needed standing up from a chair using your arms (e.g., wheelchair or bedside chair)?: A Little Help needed to walk in hospital room?: A Little Help needed climbing 3-5 steps with a railing? : A Little 6 Click  Score: 21    End of Session Equipment Utilized During Treatment: Gait belt Activity Tolerance: Patient tolerated treatment well Patient left: in chair;with call bell/phone within reach;with family/visitor present Nurse Communication: Mobility status PT Visit Diagnosis: Difficulty in walking, not elsewhere classified (R26.2);Muscle weakness (generalized) (M62.81);Pain Pain - Right/Left: Left Pain - part of body: Knee    Time: 7654-6503 PT Time Calculation (min) (ACUTE ONLY): 20 min   Charges:   PT Evaluation $PT Eval Low Complexity: 1 Low          Erick Blinks, SPT  Erick Blinks 07/28/2018, 4:36 PM

## 2018-07-28 NOTE — Anesthesia Procedure Notes (Signed)
Spinal  Patient location during procedure: OR Start time: 07/28/2018 7:30 AM End time: 07/28/2018 7:34 AM Staffing Anesthesiologist: Roderic Palau, MD Performed: anesthesiologist  Preanesthetic Checklist Completed: patient identified, surgical consent, pre-op evaluation, timeout performed, IV checked, risks and benefits discussed and monitors and equipment checked Spinal Block Patient position: sitting Prep: DuraPrep Patient monitoring: cardiac monitor, continuous pulse ox and blood pressure Approach: midline Location: L3-4 Injection technique: single-shot Needle Needle type: Pencan  Needle gauge: 24 G Needle length: 9 cm Assessment Sensory level: T8 Additional Notes Functioning IV was confirmed and monitors were applied. Sterile prep and drape, including hand hygiene and sterile gloves were used. The patient was positioned and the spine was prepped. The skin was anesthetized with lidocaine.  Free flow of clear CSF was obtained prior to injecting local anesthetic into the CSF.  The spinal needle aspirated freely following injection.  The needle was carefully withdrawn.  The patient tolerated the procedure well.

## 2018-07-28 NOTE — Interval H&P Note (Signed)
History and Physical Interval Note:  07/28/2018 7:17 AM  [name] [name]  has presented today for surgery, with the diagnosis of LEFT KNEE DEGENERATIVE JOINT DISEASE  The various methods of treatment have been discussed with the patient and family. After consideration of risks, benefits and other options for treatment, the patient has consented to  Procedure(s): TOTAL KNEE ARTHROPLASTY (Left) as a surgical intervention .  The patient's history has been reviewed, patient examined, no change in status, stable for surgery.  I have reviewed the patient's chart and labs.  Questions were answered to the patient's satisfaction.     Hessie Dibble

## 2018-07-28 NOTE — Plan of Care (Signed)

## 2018-07-28 NOTE — Anesthesia Postprocedure Evaluation (Signed)
Anesthesia Post Note  Patient: [name] [name]  Procedure(s) Performed: TOTAL KNEE ARTHROPLASTY (Left )     Patient location during evaluation: PACU Anesthesia Type: Spinal and Regional Level of consciousness: oriented and awake and alert Pain management: pain level controlled Vital Signs Assessment: post-procedure vital signs reviewed and stable Respiratory status: spontaneous breathing and respiratory function stable Cardiovascular status: blood pressure returned to baseline and stable Postop Assessment: no headache, no backache, no apparent nausea or vomiting, spinal receding and patient able to bend at knees Anesthetic complications: no    Last Vitals:  Vitals:   07/28/18 1030 07/28/18 1045  BP: 109/60 (!) 105/55  Pulse: (!) 56 (!) 53  Resp: 15 16  Temp:    SpO2: 99% 97%    Last Pain:  Vitals:   07/28/18 1045  TempSrc:   PainSc: 0-No pain                 Brantlee Hinde,W. EDMOND

## 2018-07-28 NOTE — Anesthesia Procedure Notes (Signed)
Anesthesia Regional Block: Adductor canal block   Pre-Anesthetic Checklist: ,, timeout performed, Correct Patient, Correct Site, Correct Laterality, Correct Procedure, Correct Position, site marked, Risks and benefits discussed, pre-op evaluation,  At surgeon's request and post-op pain management  Laterality: Left  Prep: Maximum Sterile Barrier Precautions used, chloraprep       Needles:  Injection technique: Single-shot  Needle Type: Echogenic Stimulator Needle     Needle Length: 9cm  Needle Gauge: 21     Additional Needles:   Procedures:,,,, ultrasound used (permanent image in chart),,,,  Narrative:  Start time: 07/28/2018 7:00 AM End time: 07/28/2018 7:10 AM Injection made incrementally with aspirations every 5 mL.  Performed by: Personally  Anesthesiologist: Roderic Palau, MD  Additional Notes: 2% Lidocaine skin wheel.

## 2018-07-28 NOTE — Plan of Care (Signed)
  Problem: Pain Managment: Goal: General experience of comfort will improve Outcome: Progressing   Problem: Safety: Goal: Ability to remain free from injury will improve Outcome: Progressing   

## 2018-07-28 NOTE — Transfer of Care (Signed)
Immediate Anesthesia Transfer of Care Note  Patient: [name] [name]  Procedure(s) Performed: TOTAL KNEE ARTHROPLASTY (Left )  Patient Location: PACU  Anesthesia Type:Spinal and MAC combined with regional for post-op pain  Level of Consciousness: awake, alert  and oriented  Airway & Oxygen Therapy: Patient Spontanous Breathing and Patient connected to face mask oxygen  Post-op Assessment: Report given to RN, Post -op Vital signs reviewed and stable and Patient moving all extremities  Post vital signs: Reviewed and stable  Last Vitals:  Vitals Value Taken Time  BP 124/69 07/28/2018  9:44 AM  Temp    Pulse 65 07/28/2018  9:46 AM  Resp 20 07/28/2018  9:46 AM  SpO2 100 % 07/28/2018  9:46 AM  Vitals shown include unvalidated device data.  Last Pain:  Vitals:   07/28/18 0612  TempSrc: Oral  PainSc: 0-No pain         Complications: No apparent anesthesia complications

## 2018-07-28 NOTE — Op Note (Signed)
PREOP DIAGNOSIS: DJD LEFT KNEE POSTOP DIAGNOSIS:  same PROCEDURE: LEFT TKR ANESTHESIA: Spinal and MAC ATTENDING SURGEON: Hessie Dibble ASSISTANT: Loni Dolly PA  INDICATIONS FOR PROCEDURE: [name] [name] is a 68 y.o. male who has struggled for a long time with pain due to degenerative arthritis of the left knee.  The patient has failed many conservative non-operative measures and at this point has pain which limits the ability to sleep and walk.  The patient is offered total knee replacement.  Informed operative consent was obtained after discussion of possible risks of anesthesia, infection, neurovascular injury, DVT, and death.  The importance of the post-operative rehabilitation protocol to optimize result was stressed extensively with the patient.  SUMMARY OF FINDINGS AND PROCEDURE:  [name] [name] was taken to the operative suite where under the above anesthesia a left knee replacement was performed.  There were advanced degenerative changes and the bone quality was excellent.  We used the DePuyAttune system and placed size 6 femur, 8 tibia, 41 mm all polyethylene patella, and a size 10 mm spacer.  Loni Dolly PA-C assisted throughout and was invaluable to the completion of the case in that he helped retract and maintain exposure while I placed the components.  He also helped close thereby minimizing OR time.  The patient was admitted for appropriate post-op care to include perioperative antibiotics and mechanical and pharmacologic measures for DVT prophylaxis.  DESCRIPTION OF PROCEDURE:  [name] [name] was taken to the operative suite where the above anesthesia was applied.  The patient was positioned supine and prepped and draped in normal sterile fashion.  An appropriate time out was performed.  After the administration of kefzol pre-op antibiotic the leg was elevated and exsanguinated and a tourniquet inflated.  A standard longitudinal incision was made on the anterior knee.   Dissection was carried down to the extensor mechanism.  All appropriate anti-infective measures were used including the pre-operative antibiotic, betadine impregnated drape, and closed hooded exhaust systems for each member of the surgical team.  A medial parapatellar incision was made in the extensor mechanism and the knee cap flipped and the knee flexed.  Some residual meniscal tissues were removed along with any remaining ACL/PCL tissue.  A guide was placed on the tibia and a flat cut was made on it's superior surface.  An intramedullary guide was placed in the femur and was utilized to make anterior and posterior cuts creating an appropriate flexion gap.  A second intramedullary guide was placed in the femur to make a distal cut properly balancing the knee with an extension gap equal to the flexion gap.  The three bones sized to the above mentioned sizes and the appropriate guides were placed and utilized.  A trial reduction was done and the knee easily came to full extension and the patella tracked well on flexion.  The trial components were removed and all bones were cleaned with pulsatile lavage and then dried thoroughly.  Cement was mixed and was pressurized onto the bones followed by placement of the aforementioned components.  Excess cement was trimmed and pressure was held on the components until the cement had hardened.  The tourniquet was deflated and a small amount of bleeding was controlled with cautery and pressure.  The knee was irrigated thoroughly.  The extensor mechanism was re-approximated with V-loc suture in running fashion.  The knee was flexed and the repair was solid.  The subcutaneous tissues were re-approximated with #0 and #2-0 vicryl and the skin  closed with a subcuticular stitch and steristrips.  A sterile dressing was applied.  Intraoperative fluids, EBL, and tourniquet time can be obtained from anesthesia records.  DISPOSITION:  The patient was taken to recovery room in stable  condition and admitted for appropriate post-op care to include peri-operative antibiotic and DVT prophylaxis with mechanical and pharmacologic measures.  Hessie Dibble 07/28/2018, 9:11 AM

## 2018-07-29 ENCOUNTER — Encounter (HOSPITAL_COMMUNITY): Payer: Self-pay | Admitting: Orthopaedic Surgery

## 2018-07-29 DIAGNOSIS — Z8249 Family history of ischemic heart disease and other diseases of the circulatory system: Secondary | ICD-10-CM | POA: Diagnosis not present

## 2018-07-29 DIAGNOSIS — M1712 Unilateral primary osteoarthritis, left knee: Secondary | ICD-10-CM | POA: Diagnosis not present

## 2018-07-29 DIAGNOSIS — E785 Hyperlipidemia, unspecified: Secondary | ICD-10-CM | POA: Diagnosis not present

## 2018-07-29 DIAGNOSIS — Z823 Family history of stroke: Secondary | ICD-10-CM | POA: Diagnosis not present

## 2018-07-29 DIAGNOSIS — I700 Atherosclerosis of aorta: Secondary | ICD-10-CM | POA: Diagnosis not present

## 2018-07-29 DIAGNOSIS — Z6841 Body Mass Index (BMI) 40.0 and over, adult: Secondary | ICD-10-CM | POA: Diagnosis not present

## 2018-07-29 DIAGNOSIS — K219 Gastro-esophageal reflux disease without esophagitis: Secondary | ICD-10-CM | POA: Diagnosis not present

## 2018-07-29 DIAGNOSIS — Z7984 Long term (current) use of oral hypoglycemic drugs: Secondary | ICD-10-CM | POA: Diagnosis not present

## 2018-07-29 DIAGNOSIS — Z791 Long term (current) use of non-steroidal anti-inflammatories (NSAID): Secondary | ICD-10-CM | POA: Diagnosis not present

## 2018-07-29 DIAGNOSIS — I5022 Chronic systolic (congestive) heart failure: Secondary | ICD-10-CM | POA: Diagnosis not present

## 2018-07-29 DIAGNOSIS — I110 Hypertensive heart disease with heart failure: Secondary | ICD-10-CM | POA: Diagnosis not present

## 2018-07-29 DIAGNOSIS — E119 Type 2 diabetes mellitus without complications: Secondary | ICD-10-CM | POA: Diagnosis not present

## 2018-07-29 DIAGNOSIS — Z79899 Other long term (current) drug therapy: Secondary | ICD-10-CM | POA: Diagnosis not present

## 2018-07-29 LAB — GLUCOSE, CAPILLARY
Glucose-Capillary: 106 mg/dL — ABNORMAL HIGH (ref 70–99)
Glucose-Capillary: 124 mg/dL — ABNORMAL HIGH (ref 70–99)

## 2018-07-29 MED ORDER — ASPIRIN 325 MG PO TBEC: 325.0000 mg | DELAYED_RELEASE_TABLET | Freq: Two times a day (BID) | ORAL | 0 refills | Status: DC

## 2018-07-29 MED ORDER — TIZANIDINE HCL 4 MG PO TABS: 4.0000 mg | ORAL_TABLET | Freq: Four times a day (QID) | ORAL | 1 refills | Status: AC | PRN

## 2018-07-29 MED ORDER — HYDROCODONE-ACETAMINOPHEN 5-325 MG PO TABS: 1.0000 | ORAL_TABLET | Freq: Four times a day (QID) | ORAL | 0 refills | Status: DC | PRN

## 2018-07-29 MED FILL — ASPIRIN EC 325 MG TABLET: 325 | 15 days supply | Qty: 30 | Fill #0

## 2018-07-29 MED FILL — tiZANidine HCL 4 MG TABS: 4 | 10 days supply | Qty: 40 | Fill #0

## 2018-07-29 MED FILL — HYDROCODON-APAP 5-325: 5-325 | 5 days supply | Qty: 40 | Fill #0

## 2018-07-29 NOTE — Care Management Obs Status (Signed)
[place] NOTIFICATION   Patient Details  Name: [name] [name] MRN: [id number] Date of Birth: Feb 10, 1951   Medicare Observation Status Notification Given:  Yes    Erenest Rasher, RN 07/29/2018, 11:04 AM

## 2018-07-29 NOTE — Progress Notes (Signed)
Subjective: 1 Day Post-Op Procedure(s) (LRB): TOTAL KNEE ARTHROPLASTY (Left)   Patient feels well and would like to go home today.  Activity level:  wbat Diet tolerance:  ok Voiding:  Foley out this morning Patient reports pain as mild.    Objective: Vital signs in last 24 hours: Temp:  [97.5 F (36.4 C)-98 F (36.7 C)] 98 F (36.7 C) (01/22 0452) Pulse Rate:  [50-65] 58 (01/22 0452) Resp:  [12-21] 16 (01/22 0452) BP: (103-131)/(55-73) 126/69 (01/22 0452) SpO2:  [96 %-100 %] 99 % (01/22 0452)  Labs: No results for input(s): HGB in the last 72 hours. No results for input(s): WBC, RBC, HCT, PLT in the last 72 hours. No results for input(s): NA, K, CL, CO2, BUN, CREATININE, GLUCOSE, CALCIUM in the last 72 hours. No results for input(s): LABPT, INR in the last 72 hours.  Physical Exam:  Neurologically intact ABD soft Neurovascular intact Sensation intact distally Intact pulses distally Dorsiflexion/Plantar flexion intact Incision: dressing C/D/I and no drainage No cellulitis present Compartment soft  Assessment/Plan:  1 Day Post-Op Procedure(s) (LRB): TOTAL KNEE ARTHROPLASTY (Left) Advance diet Up with therapy Discharge home with home health today if doing well with PT. Continue on ASA 325mg  BID x 2 weeks for DVT rpevention. Follow up in office 2 weeks post op.  Patient's anticipated LOS is less than 2 midnights, meeting these requirements: - Younger than 63 - Lives within 1 hour of care - Has a competent adult at home to recover with post-op recover - NO history of  - Chronic pain requiring opiods  - Diabetes  - Coronary Artery Disease  - Heart failure  - Heart attack  - Stroke  - DVT/VTE  - Cardiac arrhythmia  - Respiratory Failure/COPD  - Renal failure  - Anemia  - Advanced Liver disease  [name] [name] [name] [name] 07/29/2018, 7:03 AM

## 2018-07-29 NOTE — Progress Notes (Signed)
Physical Therapy Treatment Patient Details Name: [name] [name] MRN: [id number] DOB: December 13, 1950 Today's Date: 07/29/2018    History of Present Illness 68 y/o male s/p L TKA on 1/21. PMH is CHF, HLD, DMII, HTN, obesity, and hx of R/L heart cath and coronary angiography.     PT Comments    Pt was seen for last visit, was able to get there ex done with handout provided.  Pt is motivated to get up and walk but is quite tired and sore from the AM per his report.  Follow acutely for ROM and balance training to increase his ability to go home on stairs and with AD.  He is demonstrating very respectable ROM to L knee and will expect a good recovery.   Follow Up Recommendations  Follow surgeon's recommendation for DC plan and follow-up therapies     Equipment Recommendations  None recommended by PT    Recommendations for Other Services       Precautions / Restrictions Precautions Precautions: Fall;Knee Precaution Booklet Issued: Yes (comment) Precaution Comments: reviewed knee precautions with patient and patient's wife Restrictions Weight Bearing Restrictions: Yes LLE Weight Bearing: Weight bearing as tolerated    Mobility  Bed Mobility Overal bed mobility: Modified Independent             General bed mobility comments: declined OOB  Transfers Overall transfer level: Needs assistance Equipment used: Rolling walker (2 wheeled) Transfers: Sit to/from Stand Sit to Stand: Min guard         General transfer comment: declined  Ambulation/Gait Ambulation/Gait assistance: Supervision Gait Distance (Feet): 90 Feet Assistive device: Rolling walker (2 wheeled) Gait Pattern/deviations: Step-through pattern;Decreased stride length;Wide base of support;Trunk flexed Gait velocity: decreased Gait velocity interpretation: <1.31 ft/sec, indicative of household ambulator General Gait Details: pt is more reciprocal as he warms up and progresses but painful on L knee with heel  down   Stairs Stairs: Yes Stairs assistance: Min guard(for safety) Stair Management: Two rails;Step to pattern;Forwards Number of Stairs: 10 General stair comments: good understanding of the pattern as he has assisted wife in the past   Wheelchair Mobility    Modified Rankin (Stroke Patients Only)       Balance Overall balance assessment: Needs assistance Sitting-balance support: Feet supported Sitting balance-Leahy Scale: Good     Standing balance support: Bilateral upper extremity supported Standing balance-Leahy Scale: Fair Standing balance comment: less than fair dynamically but static fair                            Cognition Arousal/Alertness: Awake/alert Behavior During Therapy: WFL for tasks assessed/performed Overall Cognitive Status: Within Functional Limits for tasks assessed                                        Exercises Total Joint Exercises Ankle Circles/Pumps: AROM;Both;5 reps Quad Sets: AROM;Both;10 reps Gluteal Sets: AROM;Both;10 reps Heel Slides: AROM;Both;10 reps;AAROM Hip ABduction/ADduction: AROM;AAROM;Both;10 reps Straight Leg Raises: AROM;AAROM;Both;10 reps Goniometric ROM: 80 flexion -5 ext    General Comments General comments (skin integrity, edema, etc.): pt is wearing waterproof bandage with a light amount of old blood on the gauze underneath      Pertinent Vitals/Pain Pain Assessment: 0-10 Pain Score: 3  Pain Location: L knee Pain Descriptors / Indicators: Operative site guarding Pain Intervention(s): Monitored during session;Premedicated before session;Repositioned;Ice applied  Home Living Family/patient expects to be discharged to:: Private residence Living Arrangements: Spouse/significant other Available Help at Discharge: Family;Available 24 hours/day Type of Home: House Home Access: Stairs to enter Entrance Stairs-Rails: None Home Layout: Two level Home Equipment: Environmental consultant - 2 wheels;Cane -  single point;Crutches;Tub bench;Toilet riser Additional Comments: Patient reports he plans only going up/down flight of steps x1 per day.     Prior Function Level of Independence: Independent          PT Goals (current goals can now be found in the care plan section) Progress towards PT goals: Progressing toward goals    Frequency    7X/week      PT Plan      Co-evaluation              AM-PAC PT "6 Clicks" Mobility   Outcome Measure  Help needed turning from your back to your side while in a flat bed without using bedrails?: None Help needed moving from lying on your back to sitting on the side of a flat bed without using bedrails?: None Help needed moving to and from a bed to a chair (including a wheelchair)?: None Help needed standing up from a chair using your arms (e.g., wheelchair or bedside chair)?: A Little Help needed to walk in hospital room?: A Little Help needed climbing 3-5 steps with a railing? : A Little 6 Click Score: 21    End of Session Equipment Utilized During Treatment: Gait belt Activity Tolerance: Patient tolerated treatment well Patient left: in bed;with call bell/phone within reach;with family/visitor present Nurse Communication: Mobility status PT Visit Diagnosis: Difficulty in walking, not elsewhere classified (R26.2);Muscle weakness (generalized) (M62.81);Pain Pain - Right/Left: Left Pain - part of body: Knee     Time: 6237-6283 PT Time Calculation (min) (ACUTE ONLY): 26 min  Charges:  $Gait Training: 8-22 mins $Therapeutic Exercise: 23-37 mins                 Ramond Dial 07/29/2018, 3:25 PM  Mee Hives, PT MS Acute Rehab Dept. Number: [place] and [place]

## 2018-07-29 NOTE — Care Management Note (Signed)
Case Management Note  Patient Details  Name: TORREN MAFFEO MRN: [id number] Date of Birth: 20-Feb-1951  Subjective/Objective:  68 yr old gentleman s/p left total knee arthroplasty.                  Action/Plan: Case manager spoke with patient and wife concerning discharge plan and DME. Referral for [place] called to Adela Lank, [facility] Liaison. Patient will have family support at discharge.    Expected Discharge Date:  07/29/18               Expected Discharge Plan:  [place]  In-House Referral:  NA  Discharge planning Services  CM Consult  Post Acute Care Choice:  Home Health Choice offered to:  Patient, Spouse  DME Arranged:  N/A(has RW and 3in1) DME Agency:  NA  HH Arranged:  PT HH Agency:  [facility]  Status of Service:  Completed, signed off  If discussed at [place] of Stay Meetings, dates discussed:    Additional Comments:  Ninfa Meeker, RN 07/29/2018, 1:45 PM

## 2018-07-29 NOTE — Progress Notes (Signed)
Physical Therapy Treatment Patient Details Name: [name] [name] MRN: [id number] DOB: [birth year] Today's Date: 07/29/2018    History of Present Illness 68 y/o male s/p L TKA on 1/21. PMH is CHF, HLD, DMII, HTN, obesity, and hx of R/L heart cath and coronary angiography.     PT Comments    Pt was assisted to walk, to navigate stairs and gave him a review of his hip exercises.  Pt is expecting to go home today and will progress with him as tolerated until dc.  He is demonstrating understanding of the pattern for the stairs which has been learned in the past assisting his wife.  Follow up as pt is available until dc to teach and increase strengthening, balance and gait quality.   Follow Up Recommendations  Follow surgeon's recommendation for DC plan and follow-up therapies     Equipment Recommendations  None recommended by PT    Recommendations for Other Services       Precautions / Restrictions Precautions Precautions: Fall;Knee Precaution Booklet Issued: Yes (comment) Precaution Comments: reviewed knee precautions with patient and patient's wife Restrictions Weight Bearing Restrictions: Yes LLE Weight Bearing: Weight bearing as tolerated    Mobility  Bed Mobility Overal bed mobility: Modified Independent             General bed mobility comments: extra time with rail  Transfers Overall transfer level: Needs assistance Equipment used: Rolling walker (2 wheeled) Transfers: Sit to/from Stand Sit to Stand: Min guard         General transfer comment: needed bed height adjusted to return to bed  Ambulation/Gait Ambulation/Gait assistance: Supervision Gait Distance (Feet): 90 Feet Assistive device: Rolling walker (2 wheeled) Gait Pattern/deviations: Step-through pattern;Decreased stride length;Wide base of support;Trunk flexed Gait velocity: decreased Gait velocity interpretation: <1.31 ft/sec, indicative of household ambulator General Gait Details: pt is more  reciprocal as he warms up and progresses but painful on L knee with heel down   Stairs Stairs: Yes Stairs assistance: Min guard(for safety) Stair Management: Two rails;Step to pattern;Forwards Number of Stairs: 10 General stair comments: good understanding of the pattern as he has assisted wife in the past   Wheelchair Mobility    Modified Rankin (Stroke Patients Only)       Balance Overall balance assessment: Needs assistance Sitting-balance support: Feet supported Sitting balance-Leahy Scale: Good     Standing balance support: Bilateral upper extremity supported Standing balance-Leahy Scale: Fair Standing balance comment: less than fair dynamically but static fair                            Cognition Arousal/Alertness: Awake/alert Behavior During Therapy: WFL for tasks assessed/performed Overall Cognitive Status: Within Functional Limits for tasks assessed                                        Exercises Total Joint Exercises Ankle Circles/Pumps: AROM;Both;5 reps Quad Sets: AROM;Both;10 reps    General Comments General comments (skin integrity, edema, etc.): pt is wearing waterproof bandage with a light amount of old blood on the gauze underneath      Pertinent Vitals/Pain Pain Assessment: 0-10 Pain Score: 3  Pain Location: L knee Pain Descriptors / Indicators: Operative site guarding Pain Intervention(s): Monitored during session;Premedicated before session;Repositioned;Ice applied    Home Living Family/patient expects to be discharged to:: Private residence Living Arrangements: Spouse/significant  other Available Help at Discharge: Family;Available 24 hours/day Type of Home: House Home Access: Stairs to enter Entrance Stairs-Rails: None Home Layout: Two level Home Equipment: Environmental consultant - 2 wheels;Cane - single point;Crutches;Tub bench;Toilet riser Additional Comments: Patient reports he plans only going up/down flight of steps x1  per day.     Prior Function Level of Independence: Independent          PT Goals (current goals can now be found in the care plan section) Progress towards PT goals: Progressing toward goals    Frequency    7X/week      PT Plan      Co-evaluation              AM-PAC PT "6 Clicks" Mobility   Outcome Measure  Help needed turning from your back to your side while in a flat bed without using bedrails?: None Help needed moving from lying on your back to sitting on the side of a flat bed without using bedrails?: None Help needed moving to and from a bed to a chair (including a wheelchair)?: None Help needed standing up from a chair using your arms (e.g., wheelchair or bedside chair)?: A Little Help needed to walk in hospital room?: A Little Help needed climbing 3-5 steps with a railing? : A Little 6 Click Score: 21    End of Session Equipment Utilized During Treatment: Gait belt Activity Tolerance: Patient tolerated treatment well Patient left: in bed;with call bell/phone within reach;with family/visitor present Nurse Communication: Mobility status PT Visit Diagnosis: Difficulty in walking, not elsewhere classified (R26.2);Muscle weakness (generalized) (M62.81);Pain Pain - Right/Left: Left Pain - part of body: Knee     Time: 1749-4496 PT Time Calculation (min) (ACUTE ONLY): 30 min  Charges:  $Gait Training: 8-22 mins $Therapeutic Exercise: 8-22 mins                    Ramond Dial 07/29/2018, 12:56 PM  Mee Hives, PT MS Acute Rehab Dept. Number: [place] and [place]

## 2018-07-29 NOTE — Discharge Summary (Signed)
Patient ID: [name] [name] MRN: [id number] DOB/AGE: 1951-04-14 68 y.o.  Admit date: 07/28/2018 Discharge date: 07/29/2018  Admission Diagnoses:  Principal Problem:   Primary osteoarthritis of left knee Active Problems:   Type 2 diabetes mellitus with complication, without long-term current use of insulin [facility])   Discharge Diagnoses:  Same  Past Medical History:  Diagnosis Date  . Arthritis   . Diabetes mellitus without complication ([place])   . DJD (degenerative joint disease) of knee    Left  . Hypertension     Surgeries: Procedure(s): TOTAL KNEE ARTHROPLASTY on 07/28/2018   Consultants:   Discharged Condition: Improved  Hospital Course: [name] [name] is an 68 y.o. male who was admitted 07/28/2018 for operative treatment ofPrimary osteoarthritis of left knee. Patient has severe unremitting pain that affects sleep, daily activities, and work/hobbies. After pre-op clearance the patient was taken to the operating room on 07/28/2018 and underwent  Procedure(s): TOTAL KNEE ARTHROPLASTY.    Patient was given perioperative antibiotics:  Anti-infectives (From admission, onward)   Start     Dose/Rate Route Frequency Ordered Stop   07/28/18 1330  ceFAZolin (ANCEF) 3 g in dextrose 5 % 50 mL IVPB     3 g 100 mL/hr over 30 Minutes Intravenous Every 6 hours 07/28/18 1159 07/28/18 2027   07/28/18 0630  ceFAZolin (ANCEF) 3 g in dextrose 5 % 50 mL IVPB     3 g 100 mL/hr over 30 Minutes Intravenous To [facility]Stay [facility] 07/27/18 1426 07/28/18 0731       Patient was given sequential compression devices, early ambulation, and chemoprophylaxis to prevent DVT.  Patient benefited maximally from hospital stay and there were no complications.    Recent vital signs:  Patient Vitals for the past 24 hrs:  BP Temp Temp src Pulse Resp SpO2  07/29/18 0452 126/69 98 F (36.7 C) Oral (!) 58 16 99 %  07/28/18 2350 125/60 97.8 F (36.6 C) Oral 60 16 98 %  07/28/18 1949 (!) 123/58 97.7 F (36.5  C) Oral (!) 56 16 97 %  07/28/18 1316 131/73 97.8 F (36.6 C) Oral (!) 57 - 99 %  07/28/18 1215 116/62 97.6 F (36.4 C) - (!) 53 16 98 %  07/28/18 1145 109/62 - - (!) 51 15 99 %  07/28/18 1115 115/73 - - (!) 50 12 100 %  07/28/18 1045 (!) 105/55 - - (!) 53 16 97 %  07/28/18 1030 109/60 - - (!) 56 15 99 %  07/28/18 1015 111/63 - - (!) 56 18 96 %  07/28/18 1000 103/63 - - 60 14 98 %  07/28/18 0945 124/69 (!) 97.5 F (36.4 C) - 65 (!) 21 100 %     Recent laboratory studies: No results for input(s): WBC, HGB, HCT, PLT, NA, K, CL, CO2, BUN, CREATININE, GLUCOSE, INR, CALCIUM in the last 72 hours.  Invalid input(s): PT, 2   Discharge Medications:   Allergies as of 07/29/2018   No Known Allergies     Medication List    STOP taking these medications   meloxicam 15 MG tablet Commonly known as:  MOBIC     TAKE these medications   amLODipine 10 MG tablet Commonly known as:  NORVASC Take 1 tablet (10 mg total) by mouth daily.   aspirin 325 MG EC tablet Take 1 tablet (325 mg total) by mouth 2 (two) times daily after a meal.   atorvastatin 10 MG tablet Commonly known as:  LIPITOR TAKE 1 TABLET BY MOUTH  DAILY. What changed:  when to take this   carvedilol 25 MG tablet Commonly known as:  COREG Take 1 tablet (25 mg total) by mouth 2 (two) times daily.   esomeprazole 40 MG capsule Commonly known as:  NEXIUM Take 1 capsule (40 mg total) by mouth daily.   furosemide 40 MG tablet Commonly known as:  LASIX TAKE ONE TABLET BY MOUTH DAILY What changed:  additional instructions   HYDROcodone-acetaminophen 5-325 MG tablet Commonly known as:  NORCO/VICODIN Take 1-2 tablets by mouth every 6 (six) hours as needed for moderate pain (pain score 4-6).   lisinopril 10 MG tablet Commonly known as:  PRINIVIL,ZESTRIL Take 10 mg by mouth daily.   lisinopril 20 MG tablet Commonly known as:  PRINIVIL,ZESTRIL Take 1 tablet (20 mg total) by mouth daily.   metFORMIN 500 MG 24 hr  tablet Commonly known as:  GLUCOPHAGE-XR Take 1 tablet (500 mg total) by mouth 2 (two) times daily.   tiZANidine 4 MG tablet Commonly known as:  ZANAFLEX Take 1 tablet (4 mg total) by mouth every 6 (six) hours as needed.   zolpidem 5 MG tablet Commonly known as:  AMBIEN Take 1 tablet (5 mg total) by mouth at bedtime as needed for sleep.            Durable Medical Equipment  (From admission, onward)         Start     Ordered   07/28/18 1240  DME Walker rolling  Once    Question:  Patient needs a walker to treat with the following condition  Answer:  Primary osteoarthritis of left knee   07/28/18 1239   07/28/18 1240  DME 3 n 1  Once     07/28/18 1239   07/28/18 1240  DME Bedside commode  Once    Question:  Patient needs a bedside commode to treat with the following condition  Answer:  Primary osteoarthritis of left knee   07/28/18 1239          Diagnostic Studies: Dg Chest 2 View  Result Date: 07/15/2018 CLINICAL DATA:  Preop knee replacement EXAM: CHEST - 2 VIEW COMPARISON:  None. FINDINGS: Heart is borderline in size. Lungs clear. No effusions or acute bony abnormality. IMPRESSION: Borderline heart size.  No active disease. Electronically Signed   By: Rolm Baptise M.D.   On: 07/15/2018 08:11    Disposition: Discharge disposition: 01-Home or Self Care       Discharge Instructions    Call MD / Call 911   Complete by:  As directed    If you experience chest pain or shortness of breath, CALL 911 and be transported to the hospital emergency room.  If you develope a fever above 101 F, pus (white drainage) or increased drainage or redness at the wound, or calf pain, call your surgeon's office.   Constipation Prevention   Complete by:  As directed    Drink plenty of fluids.  Prune juice may be helpful.  You may use a stool softener, such as Colace (over the counter) 100 mg twice a day.  Use MiraLax (over the counter) for constipation as needed.   Diet - low sodium heart  healthy   Complete by:  As directed    Discharge instructions   Complete by:  As directed    INSTRUCTIONS AFTER JOINT REPLACEMENT   Remove items at home which could result in a fall. This includes throw rugs or furniture in walking pathways ICE to the  affected joint every three hours while awake for 30 minutes at a time, for at least the first 3-5 days, and then as needed for pain and swelling.  Continue to use ice for pain and swelling. You may notice swelling that will progress down to the foot and ankle.  This is normal after surgery.  Elevate your leg when you are not up walking on it.   Continue to use the breathing machine you got in the hospital (incentive spirometer) which will help keep your temperature down.  It is common for your temperature to cycle up and down following surgery, especially at night when you are not up moving around and exerting yourself.  The breathing machine keeps your lungs expanded and your temperature down.   DIET:  As you were doing prior to hospitalization, we recommend a well-balanced diet.  DRESSING / WOUND CARE / SHOWERING  You may shower 3 days after surgery, but keep the wounds dry during showering.  You may use an occlusive plastic wrap (Press'n Seal for example), NO SOAKING/SUBMERGING IN THE BATHTUB.  If the bandage gets wet, change with a clean dry gauze.  If the incision gets wet, pat the wound dry with a clean towel.  ACTIVITY  Increase activity slowly as tolerated, but follow the weight bearing instructions below.   No driving for 6 weeks or until further direction given by your physician.  You cannot drive while taking narcotics.  No lifting or carrying greater than 10 lbs. until further directed by your surgeon. Avoid periods of inactivity such as sitting longer than an hour when not asleep. This helps prevent blood clots.  You may return to work once you are authorized by your doctor.     WEIGHT BEARING   Weight bearing as tolerated with  assist device (walker, cane, etc) as directed, use it as long as suggested by your surgeon or therapist, typically at least 4-6 weeks.   EXERCISES  Results after joint replacement surgery are often greatly improved when you follow the exercise, range of motion and muscle strengthening exercises prescribed by your doctor. Safety measures are also important to protect the joint from further injury. Any time any of these exercises cause you to have increased pain or swelling, decrease what you are doing until you are comfortable again and then slowly increase them. If you have problems or questions, call your caregiver or physical therapist for advice.   Rehabilitation is important following a joint replacement. After just a few days of immobilization, the muscles of the leg can become weakened and shrink (atrophy).  These exercises are designed to build up the tone and strength of the thigh and leg muscles and to improve motion. Often times heat used for twenty to thirty minutes before working out will loosen up your tissues and help with improving the range of motion but do not use heat for the first two weeks following surgery (sometimes heat can increase post-operative swelling).   These exercises can be done on a training (exercise) mat, on the floor, on a table or on a bed. Use whatever works the best and is most comfortable for you.    Use music or television while you are exercising so that the exercises are a pleasant break in your day. This will make your life better with the exercises acting as a break in your routine that you can look forward to.   Perform all exercises about fifteen times, three times per day or as directed.  You should exercise both the operative leg and the other leg as well.   Exercises include:   Quad Sets - Tighten up the muscle on the front of the thigh (Quad) and hold for 5-10 seconds.   Straight Leg Raises - With your knee straight (if you were given a brace, keep it  on), lift the leg to 60 degrees, hold for 3 seconds, and slowly lower the leg.  Perform this exercise against resistance later as your leg gets stronger.  Leg Slides: Lying on your back, slowly slide your foot toward your buttocks, bending your knee up off the floor (only go as far as is comfortable). Then slowly slide your foot back down until your leg is flat on the floor again.  Angel Wings: Lying on your back spread your legs to the side as far apart as you can without causing discomfort.  Hamstring Strength:  Lying on your back, push your heel against the floor with your leg straight by tightening up the muscles of your buttocks.  Repeat, but this time bend your knee to a comfortable angle, and push your heel against the floor.  You may put a pillow under the heel to make it more comfortable if necessary.   A rehabilitation program following joint replacement surgery can speed recovery and prevent re-injury in the future due to weakened muscles. Contact your doctor or a physical therapist for more information on knee rehabilitation.    CONSTIPATION  Constipation is defined medically as fewer than three stools per week and severe constipation as less than one stool per week.  Even if you have a regular bowel pattern at home, your normal regimen is likely to be disrupted due to multiple reasons following surgery.  Combination of anesthesia, postoperative narcotics, change in appetite and fluid intake all can affect your bowels.   YOU MUST use at least one of the following options; they are listed in order of increasing strength to get the job done.  They are all available over the counter, and you may need to use some, POSSIBLY even all of these options:    Drink plenty of fluids (prune juice may be helpful) and high fiber foods Colace 100 mg by mouth twice a day  Senokot for constipation as directed and as needed Dulcolax (bisacodyl), take with full glass of water  Miralax (polyethylene glycol)  once or twice a day as needed.  If you have tried all these things and are unable to have a bowel movement in the first 3-4 days after surgery call either your surgeon or your primary doctor.    If you experience loose stools or diarrhea, hold the medications until you stool forms back up.  If your symptoms do not get better within 1 week or if they get worse, check with your doctor.  If you experience "the worst abdominal pain ever" or develop nausea or vomiting, please contact the office immediately for further recommendations for treatment.   ITCHING:  If you experience itching with your medications, try taking only a single pain pill, or even half a pain pill at a time.  You can also use Benadryl over the counter for itching or also to help with sleep.   TED HOSE STOCKINGS:  Use stockings on both legs until for at least 2 weeks or as directed by physician office. They may be removed at night for sleeping.  MEDICATIONS:  See your medication summary on the "After Visit Summary" that nursing will review with  you.  You may have some home medications which will be placed on hold until you complete the course of blood thinner medication.  It is important for you to complete the blood thinner medication as prescribed.  PRECAUTIONS:  If you experience chest pain or shortness of breath - call 911 immediately for transfer to the hospital emergency department.   If you develop a fever greater that 101 F, purulent drainage from wound, increased redness or drainage from wound, foul odor from the wound/dressing, or calf pain - CONTACT YOUR SURGEON.                                                   FOLLOW-UP APPOINTMENTS:  If you do not already have a post-op appointment, please call the office for an appointment to be seen by your surgeon.  Guidelines for how soon to be seen are listed in your "After Visit Summary", but are typically between 1-4 weeks after surgery.  OTHER INSTRUCTIONS:   Knee  Replacement:  Do not place pillow under knee, focus on keeping the knee straight while resting. CPM instructions: 0-90 degrees, 2 hours in the morning, 2 hours in the afternoon, and 2 hours in the evening. Place foam block, curve side up under heel at all times except when in CPM or when walking.  DO NOT modify, tear, cut, or change the foam block in any way.  MAKE SURE YOU:  Understand these instructions.  Get help right away if you are not doing well or get worse.    Thank you for letting us be a part of your medical care team.  It is a privilege we respect greatly.  We hope these instructions will help you stay on track for a fast and full recovery!   Increase activity slowly as tolerated   Complete by:  As directed       Follow-up Information    Melrose Nakayama, MD. Schedule an appointment as soon as possible for a visit in 2 weeks.   Specialty:  Orthopedic Surgery Contact information: [place] [address] [phone]            Signed: Larwance Sachs Jackelyn Illingworth 07/29/2018, 7:07 AM

## 2018-07-29 NOTE — Plan of Care (Signed)

## 2018-07-29 NOTE — Progress Notes (Signed)
Provided discharge education/instructions, all questions and concerns addressed, Pt not in distress, discharged home with belongings accompanied by wife. 

## 2018-07-30 ENCOUNTER — Encounter (HOSPITAL_COMMUNITY): Payer: Self-pay | Admitting: Orthopaedic Surgery

## 2018-07-31 DIAGNOSIS — Z471 Aftercare following joint replacement surgery: Secondary | ICD-10-CM | POA: Diagnosis not present

## 2018-07-31 DIAGNOSIS — I10 Essential (primary) hypertension: Secondary | ICD-10-CM | POA: Diagnosis not present

## 2018-07-31 DIAGNOSIS — Z96652 Presence of left artificial knee joint: Secondary | ICD-10-CM | POA: Diagnosis not present

## 2018-07-31 DIAGNOSIS — E119 Type 2 diabetes mellitus without complications: Secondary | ICD-10-CM | POA: Diagnosis not present

## 2018-07-31 DIAGNOSIS — E669 Obesity, unspecified: Secondary | ICD-10-CM | POA: Diagnosis not present

## 2018-08-03 DIAGNOSIS — E119 Type 2 diabetes mellitus without complications: Secondary | ICD-10-CM | POA: Diagnosis not present

## 2018-08-03 DIAGNOSIS — Z96652 Presence of left artificial knee joint: Secondary | ICD-10-CM | POA: Diagnosis not present

## 2018-08-03 DIAGNOSIS — I10 Essential (primary) hypertension: Secondary | ICD-10-CM | POA: Diagnosis not present

## 2018-08-03 DIAGNOSIS — Z471 Aftercare following joint replacement surgery: Secondary | ICD-10-CM | POA: Diagnosis not present

## 2018-08-03 DIAGNOSIS — E669 Obesity, unspecified: Secondary | ICD-10-CM | POA: Diagnosis not present

## 2018-08-05 DIAGNOSIS — E119 Type 2 diabetes mellitus without complications: Secondary | ICD-10-CM | POA: Diagnosis not present

## 2018-08-05 DIAGNOSIS — E669 Obesity, unspecified: Secondary | ICD-10-CM | POA: Diagnosis not present

## 2018-08-05 DIAGNOSIS — I10 Essential (primary) hypertension: Secondary | ICD-10-CM | POA: Diagnosis not present

## 2018-08-05 DIAGNOSIS — Z96652 Presence of left artificial knee joint: Secondary | ICD-10-CM | POA: Diagnosis not present

## 2018-08-05 DIAGNOSIS — Z471 Aftercare following joint replacement surgery: Secondary | ICD-10-CM | POA: Diagnosis not present

## 2018-08-07 DIAGNOSIS — M1712 Unilateral primary osteoarthritis, left knee: Secondary | ICD-10-CM | POA: Diagnosis not present

## 2018-08-07 MED FILL — tiZANidine HCL 4 MG TABS: 4 | 10 days supply | Qty: 40 | Fill #0

## 2018-08-07 MED FILL — HYDROCODON-APAP 5-325: 5-325 | 5 days supply | Qty: 40 | Fill #0

## 2018-08-08 DIAGNOSIS — I10 Essential (primary) hypertension: Secondary | ICD-10-CM | POA: Diagnosis not present

## 2018-08-08 DIAGNOSIS — E119 Type 2 diabetes mellitus without complications: Secondary | ICD-10-CM | POA: Diagnosis not present

## 2018-08-08 DIAGNOSIS — E669 Obesity, unspecified: Secondary | ICD-10-CM | POA: Diagnosis not present

## 2018-08-08 DIAGNOSIS — Z471 Aftercare following joint replacement surgery: Secondary | ICD-10-CM | POA: Diagnosis not present

## 2018-08-08 DIAGNOSIS — Z96652 Presence of left artificial knee joint: Secondary | ICD-10-CM | POA: Diagnosis not present

## 2018-08-10 DIAGNOSIS — E669 Obesity, unspecified: Secondary | ICD-10-CM | POA: Diagnosis not present

## 2018-08-10 DIAGNOSIS — E119 Type 2 diabetes mellitus without complications: Secondary | ICD-10-CM | POA: Diagnosis not present

## 2018-08-10 DIAGNOSIS — Z471 Aftercare following joint replacement surgery: Secondary | ICD-10-CM | POA: Diagnosis not present

## 2018-08-10 DIAGNOSIS — Z96652 Presence of left artificial knee joint: Secondary | ICD-10-CM | POA: Diagnosis not present

## 2018-08-10 DIAGNOSIS — I10 Essential (primary) hypertension: Secondary | ICD-10-CM | POA: Diagnosis not present

## 2018-08-11 DIAGNOSIS — I10 Essential (primary) hypertension: Secondary | ICD-10-CM | POA: Diagnosis not present

## 2018-08-11 DIAGNOSIS — E669 Obesity, unspecified: Secondary | ICD-10-CM | POA: Diagnosis not present

## 2018-08-11 DIAGNOSIS — E119 Type 2 diabetes mellitus without complications: Secondary | ICD-10-CM | POA: Diagnosis not present

## 2018-08-11 DIAGNOSIS — Z471 Aftercare following joint replacement surgery: Secondary | ICD-10-CM | POA: Diagnosis not present

## 2018-08-11 DIAGNOSIS — Z96652 Presence of left artificial knee joint: Secondary | ICD-10-CM | POA: Diagnosis not present

## 2018-08-12 DIAGNOSIS — M6281 Muscle weakness (generalized): Secondary | ICD-10-CM | POA: Diagnosis not present

## 2018-08-12 DIAGNOSIS — M25662 Stiffness of left knee, not elsewhere classified: Secondary | ICD-10-CM | POA: Diagnosis not present

## 2018-08-12 DIAGNOSIS — Z96652 Presence of left artificial knee joint: Secondary | ICD-10-CM | POA: Diagnosis not present

## 2018-08-14 DIAGNOSIS — M25662 Stiffness of left knee, not elsewhere classified: Secondary | ICD-10-CM | POA: Diagnosis not present

## 2018-08-14 DIAGNOSIS — Z96652 Presence of left artificial knee joint: Secondary | ICD-10-CM | POA: Diagnosis not present

## 2018-08-14 DIAGNOSIS — M6281 Muscle weakness (generalized): Secondary | ICD-10-CM | POA: Diagnosis not present

## 2018-08-18 DIAGNOSIS — M1712 Unilateral primary osteoarthritis, left knee: Secondary | ICD-10-CM | POA: Diagnosis not present

## 2018-08-19 DIAGNOSIS — M6281 Muscle weakness (generalized): Secondary | ICD-10-CM | POA: Diagnosis not present

## 2018-08-19 DIAGNOSIS — Z96652 Presence of left artificial knee joint: Secondary | ICD-10-CM | POA: Diagnosis not present

## 2018-08-19 DIAGNOSIS — M25662 Stiffness of left knee, not elsewhere classified: Secondary | ICD-10-CM | POA: Diagnosis not present

## 2018-08-20 MED FILL — tiZANidine HCL 4 MG TABS: 4 | 10 days supply | Qty: 40 | Fill #1

## 2018-08-20 MED FILL — metFORMIN HCL ER 500 MG TB2: 500 | 90 days supply | Qty: 180 | Fill #1

## 2018-08-21 DIAGNOSIS — Z96652 Presence of left artificial knee joint: Secondary | ICD-10-CM | POA: Diagnosis not present

## 2018-08-21 DIAGNOSIS — M25662 Stiffness of left knee, not elsewhere classified: Secondary | ICD-10-CM | POA: Diagnosis not present

## 2018-08-21 DIAGNOSIS — M6281 Muscle weakness (generalized): Secondary | ICD-10-CM | POA: Diagnosis not present

## 2018-08-24 DIAGNOSIS — M6281 Muscle weakness (generalized): Secondary | ICD-10-CM | POA: Diagnosis not present

## 2018-08-24 DIAGNOSIS — Z96652 Presence of left artificial knee joint: Secondary | ICD-10-CM | POA: Diagnosis not present

## 2018-08-24 DIAGNOSIS — M25662 Stiffness of left knee, not elsewhere classified: Secondary | ICD-10-CM | POA: Diagnosis not present

## 2018-08-26 DIAGNOSIS — M25662 Stiffness of left knee, not elsewhere classified: Secondary | ICD-10-CM | POA: Diagnosis not present

## 2018-08-26 DIAGNOSIS — Z96652 Presence of left artificial knee joint: Secondary | ICD-10-CM | POA: Diagnosis not present

## 2018-08-26 DIAGNOSIS — M6281 Muscle weakness (generalized): Secondary | ICD-10-CM | POA: Diagnosis not present

## 2018-08-28 DIAGNOSIS — M25662 Stiffness of left knee, not elsewhere classified: Secondary | ICD-10-CM | POA: Diagnosis not present

## 2018-08-28 DIAGNOSIS — Z96652 Presence of left artificial knee joint: Secondary | ICD-10-CM | POA: Diagnosis not present

## 2018-08-28 DIAGNOSIS — M6281 Muscle weakness (generalized): Secondary | ICD-10-CM | POA: Diagnosis not present

## 2018-08-28 MED FILL — HYDROCODON-APAP 5-325: 5-325 | 15 days supply | Qty: 30 | Fill #0

## 2018-08-31 DIAGNOSIS — M25662 Stiffness of left knee, not elsewhere classified: Secondary | ICD-10-CM | POA: Diagnosis not present

## 2018-08-31 DIAGNOSIS — M6281 Muscle weakness (generalized): Secondary | ICD-10-CM | POA: Diagnosis not present

## 2018-08-31 DIAGNOSIS — Z96652 Presence of left artificial knee joint: Secondary | ICD-10-CM | POA: Diagnosis not present

## 2018-09-02 DIAGNOSIS — M6281 Muscle weakness (generalized): Secondary | ICD-10-CM | POA: Diagnosis not present

## 2018-09-02 DIAGNOSIS — Z96652 Presence of left artificial knee joint: Secondary | ICD-10-CM | POA: Diagnosis not present

## 2018-09-02 DIAGNOSIS — M25662 Stiffness of left knee, not elsewhere classified: Secondary | ICD-10-CM | POA: Diagnosis not present

## 2018-09-07 DIAGNOSIS — M25662 Stiffness of left knee, not elsewhere classified: Secondary | ICD-10-CM | POA: Diagnosis not present

## 2018-09-07 DIAGNOSIS — Z96652 Presence of left artificial knee joint: Secondary | ICD-10-CM | POA: Diagnosis not present

## 2018-09-07 DIAGNOSIS — M6281 Muscle weakness (generalized): Secondary | ICD-10-CM | POA: Diagnosis not present

## 2018-09-09 ENCOUNTER — Encounter: Payer: Self-pay | Admitting: Internal Medicine

## 2018-09-09 DIAGNOSIS — M25662 Stiffness of left knee, not elsewhere classified: Secondary | ICD-10-CM | POA: Diagnosis not present

## 2018-09-09 DIAGNOSIS — M6281 Muscle weakness (generalized): Secondary | ICD-10-CM | POA: Diagnosis not present

## 2018-09-09 DIAGNOSIS — Z96652 Presence of left artificial knee joint: Secondary | ICD-10-CM | POA: Diagnosis not present

## 2018-09-11 DIAGNOSIS — M6281 Muscle weakness (generalized): Secondary | ICD-10-CM | POA: Diagnosis not present

## 2018-09-11 DIAGNOSIS — M25662 Stiffness of left knee, not elsewhere classified: Secondary | ICD-10-CM | POA: Diagnosis not present

## 2018-09-11 DIAGNOSIS — Z96652 Presence of left artificial knee joint: Secondary | ICD-10-CM | POA: Diagnosis not present

## 2018-09-14 ENCOUNTER — Other Ambulatory Visit: Payer: Self-pay | Admitting: Internal Medicine

## 2018-09-14 MED FILL — SILDENAFIL CITRATE 100 MG T: 100 | 90 days supply | Qty: 18 | Fill #0

## 2018-09-14 MED FILL — LISINOPRIL 10 MG TABS: 10 | 90 days supply | Qty: 90 | Fill #3

## 2018-09-14 MED FILL — FUROSEMIDE 40 MG TAB: 40 | 90 days supply | Qty: 90 | Fill #3

## 2018-09-14 MED FILL — ATORVASTATIN 10 MG TABLET: 10 | 90 days supply | Qty: 90 | Fill #1

## 2018-09-14 MED FILL — MELOXICAM 15 MG TABLET: 15 | 90 days supply | Qty: 90 | Fill #1

## 2018-09-15 ENCOUNTER — Other Ambulatory Visit: Payer: Self-pay | Admitting: Internal Medicine

## 2018-09-16 MED FILL — AMLODIPINE BESYLATE 10 MG T: 10 | 90 days supply | Qty: 90 | Fill #0

## 2018-09-16 MED FILL — ESOMEPRAZOLE MAG DR 40 MG C: 40 | 90 days supply | Qty: 90 | Fill #0

## 2018-10-11 ENCOUNTER — Telehealth: Payer: Self-pay | Admitting: Interventional Cardiology

## 2018-10-11 NOTE — Progress Notes (Deleted)
Start entresto

## 2018-10-11 NOTE — Telephone Encounter (Signed)
He needs a video telemedicine visit, with pre recorded BP, HR, and Weight. We will be switching from Lisinopril to [facility]

## 2018-10-12 NOTE — Telephone Encounter (Signed)
Spoke with pt and he is willing to change over to the virtual visit.  Advised pt we would be in contact with him to set up consent and WebEx.  Pt verbalized understanding .

## 2018-10-13 ENCOUNTER — Telehealth: Payer: Self-pay

## 2018-10-13 NOTE — Telephone Encounter (Signed)
Pt declined setting up MyChart but will download the WebEx for the appt with Dr Tamala Julian on 10/14/2018. He agreed to obtain his Vital Signs prior to his appt.       Virtual Visit Pre-Appointment Phone Call  Steps For Call:  1. Confirm consent - "In the setting of the current Covid19 crisis, you are scheduled for a (phone or video) visit with your provider on (date) at (time).  Just as we do with many in-office visits, in order for you to participate in this visit, we must obtain consent.  If you'd like, I can send this to your mychart (if signed up) or email for you to review.  Otherwise, I can obtain your verbal consent now.  All virtual visits are billed to your insurance company just like a normal visit would be.  By agreeing to a virtual visit, we'd like you to understand that the technology does not allow for your provider to perform an examination, and thus may limit your provider's ability to fully assess your condition.  Finally, though the technology is pretty good, we cannot assure that it will always work on either your or our end, and in the setting of a video visit, we may have to convert it to a phone-only visit.  In either situation, we cannot ensure that we have a secure connection.  Are you willing to proceed?"  2. Give patient instructions for WebEx download to smartphone as below if video visit  3. Advise patient to be prepared with any vital sign or heart rhythm information, their current medicines, and a piece of paper and pen handy for any instructions they may receive the day of their visit  4. Inform patient they will receive a phone call 15 minutes prior to their appointment time (may be from unknown caller ID) so they should be prepared to answer  5. Confirm that appointment type is correct in Epic appointment notes (video vs telephone)    TELEPHONE CALL NOTE  [name] [name] has been deemed a candidate for a follow-up tele-health visit to limit community exposure  during the Covid-19 pandemic. I spoke with the patient via phone to ensure availability of phone/video source, confirm preferred email & phone number, and discuss instructions and expectations.  I reminded [name] [name] to be prepared with any vital sign and/or heart rhythm information that could potentially be obtained via home monitoring, at the time of his visit. I reminded [name] [name] to expect a phone call at the time of his visit if his visit.  Did the patient verbally acknowledge consent to treatment? Yes  Areatha Kalata, [facility] 10/13/2018 12:29 PM   DOWNLOADING THE [place], go to [employer] and type in WebEx in the search bar. [place] [employer], the blue/green circle. The app is free but as with any other app downloads, their phone may require them to verify saved payment information or Apple password. The patient does NOT have to create an account.  - If Android, ask patient to go to [employer] and type in WebEx in the search bar. [place] [employer], the blue/green circle. The app is free but as with any other app downloads, their phone may require them to verify saved payment information or Android password. The patient does NOT have to create an account.   CONSENT FOR TELE-HEALTH VISIT - PLEASE REVIEW  I hereby voluntarily request, consent and authorize [facility] and its  employed or Journalist, newspaper, Engineer, materials, nurse practitioners or other licensed health care professionals (the Practitioner), to provide me with telemedicine health care services (the "Services") as deemed necessary by the treating Practitioner. I acknowledge and consent to receive the Services by the Practitioner via telemedicine. I understand that the telemedicine visit will involve communicating with the Practitioner through live audiovisual communication technology and the disclosure of certain medical information by electronic  transmission. I acknowledge that I have been given the opportunity to request an in-person assessment or other available alternative prior to the telemedicine visit and am voluntarily participating in the telemedicine visit.  I understand that I have the right to withhold or withdraw my consent to the use of telemedicine in the course of my care at any time, without affecting my right to future care or treatment, and that the Practitioner or I may terminate the telemedicine visit at any time. I understand that I have the right to inspect all information obtained and/or recorded in the course of the telemedicine visit and may receive copies of available information for a reasonable fee.  I understand that some of the potential risks of receiving the Services via telemedicine include:  [name] Delay or interruption in medical evaluation due to technological equipment failure or disruption; . Information transmitted may not be sufficient (e.g. poor resolution of images) to allow for appropriate medical decision making by the Practitioner; and/or  . In rare instances, security protocols could fail, causing a breach of personal health information.  Furthermore, I acknowledge that it is my responsibility to provide information about my medical history, conditions and care that is complete and accurate to the best of my ability. I acknowledge that Practitioner's advice, recommendations, and/or decision may be based on factors not within their control, such as incomplete or inaccurate data provided by me or distortions of diagnostic images or specimens that may result from electronic transmissions. I understand that the practice of medicine is not an exact science and that Practitioner makes no warranties or guarantees regarding treatment outcomes. I acknowledge that I will receive a copy of this consent concurrently upon execution via email to the email address I last provided but may also request a printed copy by  calling the office of [place].    I understand that my insurance will be billed for this visit.   I have read or had this consent read to me. . I understand the contents of this consent, which adequately explains the benefits and risks of the Services being provided via telemedicine.  . I have been provided ample opportunity to ask questions regarding this consent and the Services and have had my questions answered to my satisfaction. . I give my informed consent for the services to be provided through the use of telemedicine in my medical care  By participating in this telemedicine visit I agree to the above.

## 2018-10-14 ENCOUNTER — Encounter: Payer: Self-pay | Admitting: Interventional Cardiology

## 2018-10-14 ENCOUNTER — Other Ambulatory Visit: Payer: Self-pay

## 2018-10-14 ENCOUNTER — Telehealth: Payer: Medicare Other | Admitting: Interventional Cardiology

## 2018-10-14 ENCOUNTER — Telehealth: Payer: Self-pay | Admitting: Interventional Cardiology

## 2018-10-14 ENCOUNTER — Telehealth (INDEPENDENT_AMBULATORY_CARE_PROVIDER_SITE_OTHER): Payer: 59 | Admitting: Interventional Cardiology

## 2018-10-14 VITALS — BP 136/73 | HR 71 | Ht 70.0 in | Wt 278.4 lb

## 2018-10-14 DIAGNOSIS — I10 Essential (primary) hypertension: Secondary | ICD-10-CM

## 2018-10-14 DIAGNOSIS — E785 Hyperlipidemia, unspecified: Secondary | ICD-10-CM | POA: Diagnosis not present

## 2018-10-14 DIAGNOSIS — E1169 Type 2 diabetes mellitus with other specified complication: Secondary | ICD-10-CM | POA: Diagnosis not present

## 2018-10-14 DIAGNOSIS — I700 Atherosclerosis of aorta: Secondary | ICD-10-CM

## 2018-10-14 DIAGNOSIS — E118 Type 2 diabetes mellitus with unspecified complications: Secondary | ICD-10-CM

## 2018-10-14 DIAGNOSIS — I5022 Chronic systolic (congestive) heart failure: Secondary | ICD-10-CM | POA: Diagnosis not present

## 2018-10-14 MED ORDER — ASPIRIN EC 81 MG PO TBEC: 81.0000 mg | DELAYED_RELEASE_TABLET | Freq: Every day | ORAL | 3 refills | Status: DC

## 2018-10-14 MED ORDER — SACUBITRIL-VALSARTAN 24-26 MG PO TABS: 1.0000 | ORAL_TABLET | Freq: Two times a day (BID) | ORAL | 11 refills | Status: DC

## 2018-10-14 MED ORDER — SACUBITRIL-VALSARTAN 49-51 MG PO TABS: 1.0000 | ORAL_TABLET | Freq: Two times a day (BID) | ORAL | 11 refills | Status: DC

## 2018-10-14 MED FILL — ENTRESTO 24 MG-26 MG TABLET: 24-26 | 30 days supply | Qty: 60 | Fill #0

## 2018-10-14 NOTE — Progress Notes (Signed)
Virtual Visit via Video Note   This visit type was conducted due to national recommendations for restrictions regarding the COVID-19 Pandemic (e.g. social distancing) in an effort to limit this patient's exposure and mitigate transmission in our community.  Due to his co-morbid illnesses, this patient is at least at moderate risk for complications without adequate follow up.  This format is felt to be most appropriate for this patient at this time.  All issues noted in this document were discussed and addressed.  A limited physical exam was performed with this format.  Please refer to the patient's chart for his consent to telehealth for [facility].   Evaluation Performed:  Follow-up visit  Date:  10/14/2018   ID:  [name] [name], DOB 09/03/1950, MRN [id number]  Patient Location: Home  Provider Location: Office  PCP:  Elby Showers, MD  Cardiologist:  Sinclair Grooms, MD  Electrophysiologist:  None   Chief Complaint: Nonischemic cardiomyopathy/chronic systolic heart failure  History of Present Illness:    [name] [name] is a 68 y.o. male who presents via audio/video conferencing for a telehealth visit today.    The patient has a history of type 2 diabetes mellitus, hypertension, abdominal atherosclerosis, nonobstructive/minimal atherosclerosis in coronary arteries, severe obesity, and recently discovered systolic left ventricular heart failure with EF less than or equal to 35%.  [name] is recovering from knee replacement surgery by Dr. Durward Fortes.  He is ambulatory.  He had no cardiac complications during surgery and is in rehab without problems.  He denies dyspnea.  He has not had chest pain.  He denies lower extremity swelling.  He does have localized left knee swelling.  He has not had syncope or palpitations.  Overall he feels well.  The patient does not have symptoms concerning for COVID-19 infection (fever, chills, cough, or new shortness of breath).    Past Medical  History:  Diagnosis Date  . Arthritis   . Diabetes mellitus without complication ([place])   . DJD (degenerative joint disease) of knee    Left  . Hypertension    Past Surgical History:  Procedure Laterality Date  . CARDIAC CATHETERIZATION  07/23/2018  . CHOLECYSTECTOMY N/A 02/04/2016   Procedure: LAPAROSCOPIC CHOLECYSTECTOMY;  Surgeon: Leighton Ruff, MD;  Location: [facility];  Service: General;  Laterality: N/A;  . ERCP N/A 02/02/2016   Procedure: ENDOSCOPIC RETROGRADE CHOLANGIOPANCREATOGRAPHY (ERCP);  Surgeon: [name], MD;  Location: [facility];  Service: Endoscopy;  Laterality: N/A;  . HERNIA REPAIR    . RIGHT/LEFT HEART CATH AND CORONARY ANGIOGRAPHY N/A 07/23/2018   Procedure: RIGHT/LEFT HEART CATH AND CORONARY ANGIOGRAPHY;  Surgeon: Belva Crome, MD;  Location: [facility];  Service: Cardiovascular;  Laterality: N/A;  . SPINE SURGERY    . TOTAL KNEE ARTHROPLASTY Left 07/28/2018   Procedure: TOTAL KNEE ARTHROPLASTY;  Surgeon: Melrose Nakayama, MD;  Location: [place];  Service: Orthopedics;  Laterality: Left;     Current Meds  Medication Sig  . amLODipine (NORVASC) 10 MG tablet TAKE 1 TABLET BY MOUTH DAILY.  [name] atorvastatin (LIPITOR) 10 MG tablet TAKE 1 TABLET BY MOUTH DAILY.  . carvedilol (COREG) 25 MG tablet Take 1 tablet (25 mg total) by mouth 2 (two) times daily.  [name] esomeprazole (NEXIUM) 40 MG capsule TAKE 1 CAPSULE BY MOUTH DAILY.  . furosemide (LASIX) 40 MG tablet TAKE ONE TABLET BY MOUTH DAILY  . lisinopril (PRINIVIL,ZESTRIL) 10 MG tablet Take 10 mg by mouth daily.  . metFORMIN (GLUCOPHAGE-XR)  500 MG 24 hr tablet Take 1 tablet (500 mg total) by mouth 2 (two) times daily.  . sildenafil (VIAGRA) 100 MG tablet TAKE 1 TABLET (100 MG TOTAL) BY MOUTH DAILY AS NEEDED.  [name] tiZANidine (ZANAFLEX) 4 MG tablet Take 1 tablet (4 mg total) by mouth every 6 (six) hours as needed.  . zolpidem (AMBIEN) 5 MG tablet Take 1 tablet (5 mg total) by mouth at bedtime as needed for sleep.      Allergies:   Patient has no known allergies.   Social History   Tobacco Use  . Smoking status: Never Smoker  . Smokeless tobacco: Never Used  Substance Use Topics  . Alcohol use: No  . Drug use: No     Family Hx: The patient's family history includes Hypertension in his mother; Stroke in his mother.  ROS:   Please see the history of present illness.    Knee soreness.  He otherwise has no complaints. All other systems reviewed and are negative.   Prior CV studies:   The following studies were reviewed today:  Echocardiography performed July 13, 2018: Study Conclusions  - Left ventricle: The cavity size was moderately dilated. Wall   thickness was normal. Systolic function was moderately to   severely reduced. The estimated ejection fraction was in the   range of 30% to 35%. Diffuse hypokinesis. Features are consistent   with a pseudonormal left ventricular filling pattern, with   concomitant abnormal relaxation and increased filling pressure   (grade 2 diastolic dysfunction). No evidence of thrombus. - Left atrium: The atrium was severely dilated.   Labs/Other Tests and Data Reviewed:    EKG:  An ECG dated July 15, 2018 was personally reviewed today and demonstrated:  Normal sinus rhythm, mild first-degree AV block at 214 ms, generalized nonspecific T wave flattening.  Recent Labs: 12/13/2017: ALT 27 07/14/2018: BUN 14; Creatinine, Ser 1.01; Hemoglobin 13.7; Platelets 243; Potassium 3.8; Sodium 136   Recent Lipid Panel Lab Results  Component Value Date/Time   CHOL 148 03/23/2018 09:42 AM   TRIG 81 03/23/2018 09:42 AM   HDL 52 03/23/2018 09:42 AM   CHOLHDL 2.8 03/23/2018 09:42 AM   LDLCALC 80 03/23/2018 09:42 AM    Wt Readings from Last 3 Encounters:  10/14/18 278 lb 6.4 oz (126.3 kg)  07/28/18 278 lb 15.9 oz (126.5 kg)  07/23/18 279 lb (126.6 kg)     Objective:    Vital Signs:  BP 136/73   Pulse 71   Ht 5\' 10"  (1.778 m)   Wt 278 lb 6.4 oz (126.3 kg)    BMI 39.95 kg/m    Well nourished, well developed male in no acute distress. Obese.  No neck vein distention.  No visualized lower extremity edema.  Breathing pattern is normal.  No skin lesions are noted.  ASSESSMENT & PLAN:    1. Chronic systolic heart failure ([place])   2. Essential hypertension   3. Hyperlipidemia associated with type 2 diabetes mellitus ([place])   4. Type 2 diabetes mellitus with complication, without long-term current use of insulin ([place])   5. Abdominal aortic atherosclerosis ([place])    PLAN according to problem list:  1. No evidence of volume overload.  He has asymptomatic left ventricular dysfunction.  We discussed optimization of therapy based upon guideline recommendations.  We will discontinue lisinopril x48 hours and start Entresto 24/26 mg twice daily.  I stressed the importance of measuring blood pressures after Entresto was started.  He was also  informed that diuretic intensity may decrease.  We will also consider the addition of mineralocorticoid receptor antagonist after Entresto was titrated.  We will have a face-to-face visit in 3 months.  A basic metabolic panel will be performed in 7 to 10 days after initiation of therapy.  Up titration of Entresto will be done in several months. 2. Low-salt diet discussed.  Target blood pressure 130/80 mmHg or less.  Greater than 875 mmHg systolic range. 3. LDL target less than 70 given diabetes and demonstrated aortic atherosclerosis. 4. SGLT2 therapy in the form of dapagliflozin or empagliflozin should be instituted.  COVID-19 Education: The signs and symptoms of COVID-19 were discussed with the patient and how to seek care for testing (follow up with PCP or arrange E-visit).  The importance of social distancing was discussed today.  Time:   Today, I have spent 15 minutes with the patient with telehealth technology discussing the above problems.     Medication Adjustments/Labs and Tests Ordered: Current medicines are  reviewed at length with the patient today.  Concerns regarding medicines are outlined above.  Tests Ordered: No orders of the defined types were placed in this encounter.  Medication Changes: No orders of the defined types were placed in this encounter.   Disposition:  Follow up in 3 month(s)  Signed, Sinclair Grooms, MD  10/14/2018 10:10 AM    [place]

## 2018-10-14 NOTE — Telephone Encounter (Signed)
  Patient was talking with Anderson Malta and lost connection. Please call him back

## 2018-10-14 NOTE — Telephone Encounter (Signed)
LMTCB

## 2018-10-14 NOTE — Patient Instructions (Signed)
Medication Instructions:  1) DISCONTINUE Lisinopril 2) START Entresto 24/26mg  twice daily 2 days after stopping Lisinopril 3) DECREASE Aspirin to 81mg  once daily  If you need a refill on your cardiac medications before your next appointment, please call your pharmacy.   Lab work: Your physician recommends that you return for lab work in: 7-10 days after starting Entresto.  If you have labs (blood work) drawn today and your tests are completely normal, you will receive your results only by: [name] MyChart Message (if you have MyChart) OR . A paper copy in the mail If you have any lab test that is abnormal or we need to change your treatment, we will call you to review the results.  Testing/Procedures: None  Follow-Up: At [facility], you and your health needs are our priority.  As part of our continuing mission to provide you with exceptional heart care, we have created designated Provider Care Teams.  These Care Teams include your primary Cardiologist (physician) and Advanced Practice Providers (APPs -  Physician Assistants and Nurse Practitioners) who all work together to provide you with the care you need, when you need it. You will need a follow up appointment in 3 months.  Please call our office 2 months in advance to schedule this appointment.  You may see Sinclair Grooms, MD or one of the following Advanced Practice Providers on your designated Care Team:   Truitt Merle, NP Cecilie Kicks, NP . Kathyrn Drown, NP  Any Other Special Instructions Will Be Listed Below (If Applicable).   Monitor your blood pressure a few times over the next week and call us or send a MyChart message with those readings

## 2018-10-14 NOTE — Progress Notes (Signed)
He needs Hgb AIC next week and phone call with me. Should have accucheck readings available at phone call

## 2018-10-15 ENCOUNTER — Other Ambulatory Visit: Payer: Self-pay | Admitting: Internal Medicine

## 2018-10-15 MED FILL — CARVEDILOL 25 MG TABLET: 25 | 90 days supply | Qty: 180 | Fill #0

## 2018-10-15 NOTE — Telephone Encounter (Signed)
Late entry: spoke with pt yesterday and went over medication changes and scheduled appts.  Pt verbalized understanding and was appreciative for call.

## 2018-10-20 ENCOUNTER — Other Ambulatory Visit: Payer: Self-pay

## 2018-10-20 ENCOUNTER — Other Ambulatory Visit: Payer: Medicare Other | Admitting: Internal Medicine

## 2018-10-20 DIAGNOSIS — E119 Type 2 diabetes mellitus without complications: Secondary | ICD-10-CM

## 2018-10-21 LAB — HEMOGLOBIN A1C
Hgb A1c MFr Bld: 6 % of total Hgb — ABNORMAL HIGH (ref <5.7)
Mean Plasma Glucose: 126 (calc)
eAG (mmol/L): 7 (calc)

## 2018-10-22 ENCOUNTER — Ambulatory Visit (INDEPENDENT_AMBULATORY_CARE_PROVIDER_SITE_OTHER): Payer: Medicare Other | Admitting: Internal Medicine

## 2018-10-22 ENCOUNTER — Encounter: Payer: Self-pay | Admitting: Internal Medicine

## 2018-10-22 DIAGNOSIS — E785 Hyperlipidemia, unspecified: Secondary | ICD-10-CM

## 2018-10-22 DIAGNOSIS — E1169 Type 2 diabetes mellitus with other specified complication: Secondary | ICD-10-CM

## 2018-10-22 DIAGNOSIS — I5042 Chronic combined systolic (congestive) and diastolic (congestive) heart failure: Secondary | ICD-10-CM

## 2018-10-22 DIAGNOSIS — I700 Atherosclerosis of aorta: Secondary | ICD-10-CM

## 2018-10-22 DIAGNOSIS — I10 Essential (primary) hypertension: Secondary | ICD-10-CM | POA: Diagnosis not present

## 2018-10-22 NOTE — Patient Instructions (Signed)
Continue current medications.  Continue to watch weight and exercise.  Welcome to Medicare physical exam scheduled for September 2020.

## 2018-10-22 NOTE — Progress Notes (Signed)
   Subjective:    Patient ID: [name] [name], male    DOB: [birth year], 68 y.o.   MRN: [id number]  68 year old Male seen today by interactive audio and visual telecommunications for follow-up on diabetes mellitus.  He had left knee arthroplasty by Dr. Latanya Maudlin January 21.  He says his blood pressure is doing well and that his Accu-Chek readings are approximately 114-116.  He was here recently for hemoglobin A1c and it is 6%.  We had thought about putting him on Invokana.  It was recommended by Dr. Tamala Julian.  He has lost about 30 pounds he says.  He has cut down on soft drinks.  He is walking some now.  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  Review of Systems no complaint of chest pain or shortness of breath- feels well     Objective:   Physical Exam Not examined but vital signs described above blood pressure running around 578 systolically and 69 diastolically.  He is checking Accu-Cheks twice daily.       Assessment & Plan:  Status post left knee arthroplasty January 2020  Diabetes mellitus-stable on current regimen  History of essential hypertension-stable on current regimen  Hyperlipidemia ( elevated LDL) 2013  treated with low-dose statin  Combined systolic and diastolic heart failure-treated by Dr. Tamala Julian and stable  Plan: We have set up his physical exam and Welcome to Medicare visit in September.  Will give consideration to starting him on Invokana at that time.  Dr. Tamala Julian thinks it would help with his heart failure.  I would prefer not to start something even with this pandemic going on.  His A1c is excellent.  25 minutes spent with patient.  We are mailing him a freestyle libre prescription at his request.

## 2018-10-23 MED FILL — ZOLPIDEM TARTRATE 5 MG TAB: 5 | 90 days supply | Qty: 90 | Fill #0

## 2018-10-26 ENCOUNTER — Other Ambulatory Visit: Payer: Medicare Other | Admitting: *Deleted

## 2018-10-26 ENCOUNTER — Other Ambulatory Visit: Payer: Self-pay

## 2018-10-26 DIAGNOSIS — I5022 Chronic systolic (congestive) heart failure: Secondary | ICD-10-CM

## 2018-10-26 LAB — BASIC METABOLIC PANEL
BUN/Creatinine Ratio: 13 (ref 10–24)
BUN: 11 mg/dL (ref 8–27)
CO2: 21 mmol/L (ref 20–29)
Calcium: 9.3 mg/dL (ref 8.6–10.2)
Chloride: 103 mmol/L (ref 96–106)
Creatinine, Ser: 0.87 mg/dL (ref 0.76–1.27)
GFR calc Af Amer: 105 mL/min/{1.73_m2} (ref >59)
GFR calc non Af Amer: 91 mL/min/{1.73_m2} (ref >59)
Glucose: 113 mg/dL — ABNORMAL HIGH (ref 65–99)
Potassium: 4.3 mmol/L (ref 3.5–5.2)
Sodium: 140 mmol/L (ref 134–144)

## 2018-10-27 ENCOUNTER — Other Ambulatory Visit: Payer: Self-pay | Admitting: *Deleted

## 2018-10-27 ENCOUNTER — Telehealth: Payer: Self-pay | Admitting: Internal Medicine

## 2018-10-27 MED ORDER — SACUBITRIL-VALSARTAN 24-26 MG PO TABS: 1.0000 | ORAL_TABLET | Freq: Two times a day (BID) | ORAL | 3 refills | Status: DC

## 2018-10-27 MED FILL — ENTRESTO 24 MG-26 MG TABLET: 24-26 | 90 days supply | Qty: 180 | Fill #0

## 2018-10-27 NOTE — Telephone Encounter (Signed)
[name] [name] [phone]  [name] called to say he is concerned about his blood pressure. This morning when he got up it was 167/85 then he took him BP medication between 9:15 - 9:30 and in an hour it was 138/71, the he took it around 1:15 and it was 170/81, then again at 2:15 it was 173/95. What does he need to do?

## 2018-10-27 NOTE — Telephone Encounter (Signed)
He should take his BP med earlier. Continue to watch it today and call us in am.

## 2018-10-27 NOTE — Telephone Encounter (Signed)
Called patient and notified of Dr. Verlene Mayer instructions. Patient verbalized understanding.

## 2018-10-28 DIAGNOSIS — M25562 Pain in left knee: Secondary | ICD-10-CM | POA: Diagnosis not present

## 2018-11-10 ENCOUNTER — Telehealth: Payer: Self-pay | Admitting: Interventional Cardiology

## 2018-11-10 ENCOUNTER — Other Ambulatory Visit: Payer: Self-pay | Admitting: Medical

## 2018-11-10 ENCOUNTER — Telehealth: Payer: Self-pay | Admitting: Internal Medicine

## 2018-11-10 DIAGNOSIS — I5022 Chronic systolic (congestive) heart failure: Secondary | ICD-10-CM

## 2018-11-10 MED ORDER — SACUBITRIL-VALSARTAN 49-51 MG PO TABS: 1.0000 | ORAL_TABLET | Freq: Two times a day (BID) | ORAL | 3 refills | Status: DC

## 2018-11-10 MED FILL — ENTRESTO 49 MG-51 MG TABLET: 49-51 | 30 days supply | Qty: 60 | Fill #0

## 2018-11-10 NOTE — Telephone Encounter (Signed)
° ° °  Pt c/o BP issue: STAT if pt c/o blurred vision, one-sided weakness or slurred speech  1. What are your last 5 BP readings? 150/84, 172/99  2. Are you having any other symptoms (ex. Dizziness, headache, blurred vision, passed out)? lightheaded  3. What is your BP issue? Patient wants to discuss Entresto, should he start Lisinopril

## 2018-11-10 NOTE — Telephone Encounter (Signed)
Called [name] and let him know that he needs to call DR Smith's office, and see what he advises.

## 2018-11-10 NOTE — Telephone Encounter (Signed)
Pt reports BP increasing over the last several weeks. Pt reports feeling "woozy but not dizzy".  This only occurs "occasionally, not all the time'". Denies any other symptoms Pt aware not to restart his Lisinopril d/t to [employer].  Pt is stable at the moment and reports no issues.  Current BP 199/97. Spoke to [employer], [place] (Pre-op APP) who will call pt to discuss further.

## 2018-11-10 NOTE — Telephone Encounter (Signed)
[name] [name] [phone]  [name] called to say that since he started new medicine entresto that he Dr Tamala Julian put him on, has notice BP being up and his head feeling woos ey. This morning he took this medicine at 7am and at 9:30 am his BP was 150/84 then at 12:45 his BP was 172/99 and his head is feeling woosey. Dr Tamala Julian took him off of lisinopril. Would you like to do virtual visit to discuss or does he need to call Dr Thompson Caul office?

## 2018-11-10 NOTE — Telephone Encounter (Signed)
   Patient called with concerns for elevated blood pressures. He reports his blood pressure has been consistently in the 140s-170s/80s-100s over the past several days. He was started on Entresto several weeks ago and his blood pressure has been suboptimal since that time. At the time of this call his BP was 190/103. He denies HA, blurry vision, dizziness, or lightheadedness. He was instructed to take his evening dose of carvedilol at this time. He is on maximum therapy of amlodipine and carvedilol. He is on the lowest dose of Entresto, therefore will plan to increase entresto dose to 49-51mg  BID (prescription sent to [facility]) with plans to take first dose this evening. His Cr was 0.8 on last blood work 10/26/2018. Will plan to repeat BMET in 1 week for close monitoring. Patient educated on warning signs for hypertensive emergency and instructed to present to the ED if he develops HA, blurry vision, dizziness, or lightheadedness. Will route to Dr. Tamala Julian and Venida Jarvis (RN) to consider a virtual visit next week to follow-up on blood pressure.   Abigail Butts, PA-C 11/10/18; 5:13 PM

## 2018-11-10 NOTE — Telephone Encounter (Signed)
He needs to call Dr. Tamala Julian. It may take some time getting used to this drug.

## 2018-11-11 NOTE — Telephone Encounter (Signed)
Thanks [place]. Anderson Malta, he needs to continue BP recording, get BMET in 7 days, and we should have virtual visit in 1 week if BP remains elevated.

## 2018-11-11 NOTE — Addendum Note (Signed)
Addended by: Loren Racer on: 11/11/2018 09:28 AM   Modules accepted: Orders

## 2018-11-11 NOTE — Telephone Encounter (Signed)
Spoke with pt and got him scheduled for labs on Tuesday.  Pt states BP much better this morning, didn't have reading with him.  Advised him to continue to monitor and to call me Friday or Monday if BP becomes elevated again so we can get him scheduled for a virtual visit with Dr. Tamala Julian.  Pt verbalized understanding and was in agreement with this plan.

## 2018-11-12 NOTE — Telephone Encounter (Signed)
Spoke with pt and he states he got up at 6:30A and took Amlodipine, Entresto and Coreg.  Laid back down for 2 hours and then got up and took BP.  BP was 184/104 at that time.  Feels fine. Had a little dizziness this morning with position change but ok now.  Has not taken Furosemide yet.  Advised I would speak to Dr. Tamala Julian and call back with recommendations.

## 2018-11-12 NOTE — Telephone Encounter (Signed)
° ° °  Pt c/o BP issue: STAT if pt c/o blurred vision, one-sided weakness or slurred speech  1. What are your last 5 BP readings? 184/104 2. Are you having any other symptoms (ex. Dizziness, headache, blurred vision, passed out)? Dizziness after sitting up   3. What is your BP issue? Elevated BP

## 2018-11-13 ENCOUNTER — Telehealth: Payer: Self-pay | Admitting: Interventional Cardiology

## 2018-11-13 MED FILL — metFORMIN HCL ER 500 MG TB2: 500 | 90 days supply | Qty: 180 | Fill #0

## 2018-11-13 NOTE — Telephone Encounter (Signed)
I spoke with [name] [name]. He reports he was told to call this AM with BP reading. BP and heart rate as listed below. He took AM meds at 6 AM and BP at 10 AM. He reports he is feeling fine. I advised him to continue current treatment plan and that I would send message to Dr. Tamala Julian. I told him we would call him if Dr. Tamala Julian wanted to make any changes.

## 2018-11-13 NOTE — Telephone Encounter (Signed)
New message:    Patient calling to report his BP 10 am today 159/92 pulse 70.

## 2018-11-14 NOTE — Telephone Encounter (Signed)
After being on current dose of Sacubitril /Valsartan 49/51 mg BID for at least 2-3 weeks, will increase to max dose, 200 mg (98/102) BID, if BP remaining > 140/90 mmHg. When/if increase made, will need to check BMET 7 days later.

## 2018-11-16 NOTE — Telephone Encounter (Signed)
Spoke with pt and went over Dr. Thompson Caul recommendations.  Pt states BP is looking much better.  140s/70s.  Advised pt to continue to monitor and we will keep plan to check labs.  Pt verbalized understanding and was in agreement with this plan.

## 2018-11-17 ENCOUNTER — Other Ambulatory Visit: Payer: Medicare Other

## 2018-11-17 ENCOUNTER — Other Ambulatory Visit: Payer: Self-pay

## 2018-11-17 DIAGNOSIS — I5022 Chronic systolic (congestive) heart failure: Secondary | ICD-10-CM

## 2018-11-17 LAB — BASIC METABOLIC PANEL
BUN/Creatinine Ratio: 11 (ref 10–24)
BUN: 10 mg/dL (ref 8–27)
CO2: 22 mmol/L (ref 20–29)
Calcium: 9.4 mg/dL (ref 8.6–10.2)
Chloride: 100 mmol/L (ref 96–106)
Creatinine, Ser: 0.91 mg/dL (ref 0.76–1.27)
GFR calc Af Amer: 102 mL/min/{1.73_m2} (ref >59)
GFR calc non Af Amer: 88 mL/min/{1.73_m2} (ref >59)
Glucose: 149 mg/dL — ABNORMAL HIGH (ref 65–99)
Potassium: 4.3 mmol/L (ref 3.5–5.2)
Sodium: 138 mmol/L (ref 134–144)

## 2018-11-22 ENCOUNTER — Encounter (HOSPITAL_COMMUNITY): Payer: Self-pay

## 2018-11-22 ENCOUNTER — Emergency Department (HOSPITAL_COMMUNITY)
Admission: EM | Admit: 2018-11-22 | Discharge: 2018-11-22 | Disposition: A | Discharge status: Home / Self Care | Payer: Medicare Other | Attending: Emergency Medicine | Admitting: Emergency Medicine

## 2018-11-22 ENCOUNTER — Other Ambulatory Visit: Payer: Self-pay

## 2018-11-22 DIAGNOSIS — Z6839 Body mass index (BMI) 39.0-39.9, adult: Secondary | ICD-10-CM | POA: Diagnosis not present

## 2018-11-22 DIAGNOSIS — Z7982 Long term (current) use of aspirin: Secondary | ICD-10-CM | POA: Diagnosis not present

## 2018-11-22 DIAGNOSIS — I10 Essential (primary) hypertension: Secondary | ICD-10-CM

## 2018-11-22 DIAGNOSIS — Z79899 Other long term (current) drug therapy: Secondary | ICD-10-CM | POA: Diagnosis not present

## 2018-11-22 DIAGNOSIS — Z7984 Long term (current) use of oral hypoglycemic drugs: Secondary | ICD-10-CM | POA: Diagnosis not present

## 2018-11-22 DIAGNOSIS — E669 Obesity, unspecified: Secondary | ICD-10-CM | POA: Insufficient documentation

## 2018-11-22 DIAGNOSIS — I5022 Chronic systolic (congestive) heart failure: Secondary | ICD-10-CM | POA: Insufficient documentation

## 2018-11-22 DIAGNOSIS — E119 Type 2 diabetes mellitus without complications: Secondary | ICD-10-CM | POA: Insufficient documentation

## 2018-11-22 NOTE — ED Notes (Signed)
Pts wife [name] [name]: home [phone] Cell [phone]

## 2018-11-22 NOTE — ED Notes (Signed)
All appropriate discharge materials reviewed at length with patient. Time for questions provided. Pt has no other questions at this time and verbalizes understanding of all provided materials.  

## 2018-11-22 NOTE — ED Provider Notes (Signed)
[facility] EMERGENCY DEPARTMENT Provider Note   CSN: [id number] Arrival date & time: 11/22/18  1415    History   Chief Complaint Chief Complaint  Patient presents with  . Hypertension    HPI [name] [name] is a 68 y.o. male with history of hypertension, CHF, HLD, diabetes mellitus, osteoarthritis, obesity presents for evaluation of acutely elevated blood pressure earlier today. Chart review shows that he recently had some medication changes via telemedicine visit with his cardiologist on 10/14/2018 in which his lisinopril was discontinued and he was started on Entresto with BMP to be performed 7 to 10 days after initiation of therapy.  Documentation of that visit notes that his dose of Entresto will be titrated up over the course of several months.  He notes that since the medication changes his baseline blood pressure has increased somewhat, with systolics anywhere from 350-093.  He called his cardiologist on 11/10/2018 and his dosage of Entresto was increased.  He had repeat blood work done on 11/17/2018 which was reassuring.  He states that earlier today he checked his blood pressure at around 1:30 PM which was 190/100.  He denies any associated headache, blurred vision, confusion, chest pain, shortness of breath, or decreased urination.  Telephone encounters note that his cardiologist planned on increasing his dose of Entresto if he had persistently elevated blood pressures for at least 2 to 3 weeks on his newer dose.  He reports that instead of calling his cardiologist he "just wanted to get checked out ".  Denies any complaints at this time.     The history is provided by the patient.    Past Medical History:  Diagnosis Date  . Arthritis   . Diabetes mellitus without complication ([place])   . DJD (degenerative joint disease) of knee    Left  . Hypertension     Patient Active Problem List   Diagnosis Date Noted  . Primary osteoarthritis of left knee 07/28/2018  .  Chronic systolic heart failure ([place]) 07/15/2018  . Abdominal aortic atherosclerosis ([place]) 01/14/2018  . PVC's (premature ventricular contractions) 10/05/2017  . Hyperlipidemia associated with type 2 diabetes mellitus ([place]) 10/05/2017  . BMI 40.0-44.9, adult ([place]) 10/05/2017  . History of gout 07/04/2013  . Low serum testosterone level 07/04/2013  . GE reflux 07/06/2012  . Type 2 diabetes mellitus with complication, without long-term current use of insulin ([place]) 03/09/2012  . Osteoarthritis of both knees 03/09/2012  . Obesity 03/09/2012  . Erectile dysfunction 03/02/2012  . Hypertension 03/02/2012    Past Surgical History:  Procedure Laterality Date  . CARDIAC CATHETERIZATION  07/23/2018  . CHOLECYSTECTOMY N/A 02/04/2016   Procedure: LAPAROSCOPIC CHOLECYSTECTOMY;  Surgeon: Leighton Ruff, MD;  Location: [facility];  Service: General;  Laterality: N/A;  . ERCP N/A 02/02/2016   Procedure: ENDOSCOPIC RETROGRADE CHOLANGIOPANCREATOGRAPHY (ERCP);  Surgeon: [name], MD;  Location: [facility];  Service: Endoscopy;  Laterality: N/A;  . HERNIA REPAIR    . RIGHT/LEFT HEART CATH AND CORONARY ANGIOGRAPHY N/A 07/23/2018   Procedure: RIGHT/LEFT HEART CATH AND CORONARY ANGIOGRAPHY;  Surgeon: Belva Crome, MD;  Location: [facility];  Service: Cardiovascular;  Laterality: N/A;  . SPINE SURGERY    . TOTAL KNEE ARTHROPLASTY Left 07/28/2018   Procedure: TOTAL KNEE ARTHROPLASTY;  Surgeon: Melrose Nakayama, MD;  Location: [facility];  Service: Orthopedics;  Laterality: Left;        Home Medications    Prior to Admission medications   Medication Sig Start Date  End Date Taking? Authorizing Provider  amLODipine (NORVASC) 10 MG tablet TAKE 1 TABLET BY MOUTH DAILY. 09/16/18   Elby Showers, MD  aspirin EC 81 MG tablet Take 1 tablet (81 mg total) by mouth daily. 10/14/18   Belva Crome, MD  atorvastatin (LIPITOR) 10 MG tablet TAKE 1 TABLET BY MOUTH DAILY. 06/17/18   Elby Showers, MD  carvedilol  (COREG) 25 MG tablet Take 1 tablet (25 mg total) by mouth 2 (two) times daily. 04/22/18 11/22/18  Belva Crome, MD  esomeprazole (NEXIUM) 40 MG capsule TAKE 1 CAPSULE BY MOUTH DAILY. 09/16/18   Elby Showers, MD  furosemide (LASIX) 40 MG tablet TAKE ONE TABLET BY MOUTH DAILY 12/16/17   Elby Showers, MD  meloxicam (MOBIC) 15 MG tablet Take 15 mg by mouth daily as needed. 09/14/18   [provider]  metFORMIN (GLUCOPHAGE-XR) 500 MG 24 hr tablet Take 1 tablet (500 mg total) by mouth 2 (two) times daily. 05/12/18   Elby Showers, MD  sacubitril-valsartan (ENTRESTO) 49-51 MG Take 1 tablet by mouth 2 (two) times daily. 11/10/18   Kroeger, Lorelee Cover., PA-C  sildenafil (VIAGRA) 100 MG tablet TAKE 1 TABLET (100 MG TOTAL) BY MOUTH DAILY AS NEEDED. 09/14/18   Elby Showers, MD  tiZANidine (ZANAFLEX) 4 MG tablet Take 1 tablet (4 mg total) by mouth every 6 (six) hours as needed. 07/29/18 07/29/19  Loni Dolly, PA-C  zolpidem (AMBIEN) 5 MG tablet TAKE 1 TABLET BY MOUTH AT BEDTIME AS NEEDED FOR SLEEP. 10/15/18   Elby Showers, MD    Family History Family History  Problem Relation Age of Onset  . Stroke Mother   . Hypertension Mother     Social History Social History   Tobacco Use  . Smoking status: Never Smoker  . Smokeless tobacco: Never Used  Substance Use Topics  . Alcohol use: No  . Drug use: No     Allergies   Patient has no known allergies.   Review of Systems Review of Systems  Constitutional: Negative for chills and fever.  Eyes: Negative for visual disturbance.  Respiratory: Negative for shortness of breath.   Cardiovascular: Negative for chest pain and leg swelling.  Gastrointestinal: Negative for abdominal pain, nausea and vomiting.  Neurological: Negative for dizziness, syncope, weakness, light-headedness, numbness and headaches.  Psychiatric/Behavioral: Negative for confusion.  All other systems reviewed and are negative.    Physical Exam Updated Vital Signs BP (!)  151/67 (BP Location: Right Arm)   Pulse 67   Temp 98.4 F (36.9 C) (Oral)   Resp 16   Ht 5\' 10"  (1.778 m)   Wt 125.2 kg   SpO2 100%   BMI 39.60 kg/m   Physical Exam Vitals signs and nursing note reviewed.  Constitutional:      General: He is not in acute distress.    Appearance: He is well-developed.  HENT:     Head: Normocephalic and atraumatic.  Eyes:     General:        Right eye: No discharge.        Left eye: No discharge.     Extraocular Movements: Extraocular movements intact.     Conjunctiva/sclera: Conjunctivae normal.     Pupils: Pupils are equal, round, and reactive to light.  Neck:     Musculoskeletal: Normal range of motion and neck supple.     Vascular: No JVD.     Trachea: No tracheal deviation.  Cardiovascular:  Rate and Rhythm: Normal rate and regular rhythm.     Pulses: Normal pulses.     Heart sounds: Normal heart sounds.     Comments: 2+ radial and DP/PT pulses bilaterally, Homans sign absent bilaterally, no lower extremity edema, no palpable cords, compartments are soft  Pulmonary:     Effort: Pulmonary effort is normal.     Breath sounds: Normal breath sounds.  Abdominal:     General: Abdomen is flat. There is no distension.     Palpations: Abdomen is soft.     Tenderness: There is no abdominal tenderness. There is no guarding or rebound.  Skin:    General: Skin is warm and dry.     Findings: No erythema.  Neurological:     General: No focal deficit present.     Mental Status: He is alert and oriented to person, place, and time.     Cranial Nerves: No cranial nerve deficit.     Sensory: No sensory deficit.     Motor: No weakness.     Comments: Fluent speech with no evidence of dysarthria or aphasia.  No facial droop.  Cranial nerves appear grossly intact.  Moves extremities spontaneously.  Psychiatric:        Behavior: Behavior normal.      ED Treatments / Results  Labs (all labs ordered are listed, but only abnormal results are  displayed) Labs Reviewed - No data to display  EKG None  Radiology No results found.  Procedures Procedures (including critical care time)  Medications Ordered in ED Medications - No data to display   Initial Impression / Assessment and Plan / ED Course  I have reviewed the triage vital signs and the nursing notes.  Pertinent labs & imaging results that were available during my care of the patient were reviewed by me and considered in my medical decision making (see chart for details).        Patient presenting for evaluation of hypertension; recent medication changes with his cardiologist with whom he follows closely. He is afebrile, overall well-appearing in the ED. Initial blood pressure somewhat elevated at 172/90, on my assessment patient has had 3 blood pressure readings with systolics in the 101-751W and diastolics in the 25E and 52D.  He was asymptomatic when his blood pressure was elevated at home and he remains asymptomatic here.  He denies any symptoms concerning for endorgan damage in the last 2 weeks.  His cardiologist documented plan to increase his Entresto after he was on his most recent dose for 2 to 3 weeks.  He will have been on this dose of Entresto for 2 weeks on Tuesday in 2 days.  Given reassuring examination, multiple improved blood pressure readings, and no concerning symptoms, I feel it is reasonable to discharge him home with instructions to call his cardiologist first thing tomorrow morning for further recommendations.  No evidence of CVA, low suspicion of ACS/MI, or renal insufficiency given reassuring blood work recently and no complaints of chest pain, shortness of breath, or any anginal equivalents.  They will likely increase his Entresto and monitor his blood work closely as they have been.  Discussed strict ED return precautions. Pt verbalized understanding of and agreement with plan and is safe for discharge home at this time.   Final Clinical  Impressions(s) / ED Diagnoses   Final diagnoses:  Asymptomatic hypertension    ED Discharge Orders    None       Renita Papa, PA-C 11/22/18  [place], [place], MD 11/24/18 2128

## 2018-11-22 NOTE — ED Triage Notes (Signed)
Pt here for evaluation of hypertension for the past few days, pt had recent BP medication changes. Pt reports intermittent dizziness, denies any symptoms at this time. BP 172/90 in triage. Pt a.o, nad noted

## 2018-11-22 NOTE — Discharge Instructions (Signed)
Continue taking your blood pressure medications as prescribed.  Call your cardiologist tomorrow morning for recommendations on changing your medication dosage and possibly being scheduled for a telemedicine visit or an in person office visit.  Avoid salty foods.   Return to the emergency department immediately if any concerning signs or symptoms develop such as vision changes, dizziness, confusion, passing out, weakness to one side of the body, chest pain, shortness of breath, or decreased urine output.

## 2018-11-23 ENCOUNTER — Telehealth: Payer: Self-pay | Admitting: Interventional Cardiology

## 2018-11-23 DIAGNOSIS — I5022 Chronic systolic (congestive) heart failure: Secondary | ICD-10-CM

## 2018-11-23 DIAGNOSIS — I10 Essential (primary) hypertension: Secondary | ICD-10-CM

## 2018-11-23 MED ORDER — SACUBITRIL-VALSARTAN 97-103 MG PO TABS: 1.0000 | ORAL_TABLET | Freq: Two times a day (BID) | ORAL | 11 refills | Status: DC

## 2018-11-23 NOTE — Telephone Encounter (Signed)
Spoke with pt and he is currently taking 49-51.  Advised to increase Entresto 97/103mg  BID.  Pt in agreement with plan.  Scheduled labs for 5/26.  Advised to call sooner if any issues on the new dose.

## 2018-11-23 NOTE — Telephone Encounter (Signed)
Pt went to ED yesterday for home BP of 180s/104.  Felt a pressure in his head when BP was that high.  When he got to ED, BP was 172/90.  Pt states by the time he left it was 130s/70s.  Pt feels fine today and BP was 147/84, HR 70 this morning after meds.  Pt concerned about fluctuations in BP.  States BP was 120-130/70s prior to starting Entresto.  Advised I will send to Dr. Tamala Julian for review and advisement.

## 2018-11-23 NOTE — Telephone Encounter (Signed)
Add Aldactone 12.5 mg daily. BMET 1-2 weeks

## 2018-11-23 NOTE — Telephone Encounter (Signed)
Elevated BP. Just sent a message about starting aldactone 12.5 mg daily. First, please verify the Entresto dose. Chart says 49/51 mg BID. I thought we increased to 97/103 mg BID. If not, that should be the next step. If we have, add aldactone.

## 2018-11-23 NOTE — Addendum Note (Signed)
Addended by: Loren Racer on: 11/23/2018 01:51 PM   Modules accepted: Orders

## 2018-11-24 MED FILL — ENTRESTO 97 MG-103 MG TAB: 97-103 | 30 days supply | Qty: 60 | Fill #0

## 2018-12-01 ENCOUNTER — Ambulatory Visit (INDEPENDENT_AMBULATORY_CARE_PROVIDER_SITE_OTHER): Payer: Medicare Other | Admitting: Internal Medicine

## 2018-12-01 ENCOUNTER — Other Ambulatory Visit: Payer: Medicare Other

## 2018-12-01 ENCOUNTER — Other Ambulatory Visit: Payer: Self-pay

## 2018-12-01 ENCOUNTER — Encounter: Payer: Self-pay | Admitting: Internal Medicine

## 2018-12-01 ENCOUNTER — Telehealth: Payer: Self-pay | Admitting: Internal Medicine

## 2018-12-01 VITALS — BP 170/90 | HR 70 | Ht 70.0 in | Wt 270.0 lb

## 2018-12-01 DIAGNOSIS — F411 Generalized anxiety disorder: Secondary | ICD-10-CM | POA: Diagnosis not present

## 2018-12-01 DIAGNOSIS — I5022 Chronic systolic (congestive) heart failure: Secondary | ICD-10-CM

## 2018-12-01 DIAGNOSIS — I10 Essential (primary) hypertension: Secondary | ICD-10-CM | POA: Diagnosis not present

## 2018-12-01 DIAGNOSIS — G4700 Insomnia, unspecified: Secondary | ICD-10-CM | POA: Diagnosis not present

## 2018-12-01 DIAGNOSIS — I5042 Chronic combined systolic (congestive) and diastolic (congestive) heart failure: Secondary | ICD-10-CM | POA: Diagnosis not present

## 2018-12-01 LAB — BASIC METABOLIC PANEL
BUN/Creatinine Ratio: 11 (ref 10–24)
BUN: 10 mg/dL (ref 8–27)
CO2: 24 mmol/L (ref 20–29)
Calcium: 9.4 mg/dL (ref 8.6–10.2)
Chloride: 101 mmol/L (ref 96–106)
Creatinine, Ser: 0.89 mg/dL (ref 0.76–1.27)
GFR calc Af Amer: 104 mL/min/{1.73_m2} (ref >59)
GFR calc non Af Amer: 90 mL/min/{1.73_m2} (ref >59)
Glucose: 125 mg/dL — ABNORMAL HIGH (ref 65–99)
Potassium: 4.5 mmol/L (ref 3.5–5.2)
Sodium: 140 mmol/L (ref 134–144)

## 2018-12-01 MED ORDER — ZOLPIDEM TARTRATE 10 MG PO TABS: 10.0000 mg | ORAL_TABLET | Freq: Every evening | ORAL | 1 refills | Status: DC | PRN

## 2018-12-01 MED ORDER — ALPRAZOLAM 0.5 MG PO TABS: 0.5000 mg | ORAL_TABLET | Freq: Two times a day (BID) | ORAL | 0 refills | Status: DC | PRN

## 2018-12-01 NOTE — Telephone Encounter (Signed)
OK 

## 2018-12-01 NOTE — Telephone Encounter (Signed)
Scheduled OV.

## 2018-12-01 NOTE — Telephone Encounter (Signed)
[name] [name] [phone]  [name] called to say that his eyes are running a lot lately, head itches sometimes, he thinks it may be his nerves, he seems to be nervous a lot. Would like a visit

## 2018-12-03 MED ORDER — ALPRAZOLAM 0.5 MG PO TABS: 0.5000 mg | ORAL_TABLET | Freq: Two times a day (BID) | ORAL | 0 refills | Status: DC | PRN

## 2018-12-03 MED FILL — ALPRAZolam 0.5 MG TABS: 0.5 | 30 days supply | Qty: 60 | Fill #0

## 2018-12-06 NOTE — Patient Instructions (Addendum)
Increase Ambien from 5 to 10 mg daily.  May take Xanax 0.5 mg 1/2 to 1 tablet twice daily as needed.

## 2018-12-06 NOTE — Progress Notes (Signed)
   Subjective:    Patient ID: [name] [name], male    DOB: May 10, 1951, 68 y.o.   MRN: [id number]  HPI 68 year old male with history of hypertension and diabetes mellitus.  He has retired from the hospital.  He is spending time gardening and doing some volunteer work.  He has been anxious during the pandemic.  He is not sure quite why.  He has a history of chronic systolic heart failure and is under the care of Dr. Tamala Julian.  He is started on Entresto.  He has been having some issues with coryza but I do not see that that is a side effect of Entresto.  Sometimes he does not sleep well.  I have given him a prescription for Ambien which he tolerates well.    Review of Systems see above     Objective:   Physical Exam Vital signs reviewed.  Skin warm and dry.  He seems a little worried today.  Do not see coryza at the present time.  Affect is normal.       Assessment & Plan:  Anxiety state provided patient with Xanax prescription 0.5 mg up to twice daily as needed for anxiety.  History of insomnia-increase Ambien from 5 to 10 mg a chance  History of chronic systolic heart failure on Entresto, carvedilol and Lasix  History of essential hypertension followed by cardiology  History of GE reflux treated with Nexium.  Meds called into [facility].  He will let me know if symptoms are not well controlled.  I have not treated the coryza today because that did not think it was noticeable.

## 2018-12-17 ENCOUNTER — Other Ambulatory Visit: Payer: Self-pay | Admitting: Internal Medicine

## 2018-12-17 MED FILL — AMLODIPINE BESYLATE 10 MG T: 10 | 90 days supply | Qty: 90 | Fill #1

## 2018-12-17 MED FILL — ESOMEPRAZOLE MAG DR 40 MG C: 40 | 90 days supply | Qty: 90 | Fill #1

## 2018-12-17 MED FILL — ENTRESTO 97 MG-103 MG TAB: 97-103 | 30 days supply | Qty: 60 | Fill #0

## 2018-12-17 MED FILL — ATORVASTATIN 10 MG TABLET: 10 | 90 days supply | Qty: 90 | Fill #2

## 2018-12-17 MED FILL — FUROSEMIDE 40 MG TAB: 40 | 90 days supply | Qty: 90 | Fill #0

## 2018-12-28 ENCOUNTER — Other Ambulatory Visit: Payer: Self-pay | Admitting: Internal Medicine

## 2018-12-28 MED FILL — MELOXICAM 15 MG TABLET: 15 | 90 days supply | Qty: 90 | Fill #0

## 2018-12-28 MED FILL — ALPRAZolam 0.5 MG TABS: 0.5 | 30 days supply | Qty: 60 | Fill #0

## 2019-01-11 MED FILL — ZOLPIDEM TARTRATE 10 MG TAB: 10 | 15 days supply | Qty: 15 | Fill #0

## 2019-01-11 MED FILL — CARVEDILOL 25 MG TABLET: 25 | 90 days supply | Qty: 180 | Fill #1

## 2019-01-18 MED FILL — ENTRESTO 97 MG-103 MG TAB: 97-103 | 30 days supply | Qty: 60 | Fill #1

## 2019-01-20 ENCOUNTER — Telehealth: Payer: Self-pay

## 2019-01-20 DIAGNOSIS — Z96652 Presence of left artificial knee joint: Secondary | ICD-10-CM | POA: Diagnosis not present

## 2019-01-20 DIAGNOSIS — M1711 Unilateral primary osteoarthritis, right knee: Secondary | ICD-10-CM | POA: Diagnosis not present

## 2019-01-20 DIAGNOSIS — M1712 Unilateral primary osteoarthritis, left knee: Secondary | ICD-10-CM | POA: Diagnosis not present

## 2019-01-20 NOTE — Telephone Encounter (Signed)
YOUR CARDIOLOGY TEAM HAS ARRANGED FOR AN E-VISIT FOR YOUR APPOINTMENT - PLEASE REVIEW IMPORTANT INFORMATION BELOW SEVERAL DAYS PRIOR TO YOUR APPOINTMENT  Due to the recent COVID-19 pandemic, we are transitioning in-person office visits to tele-medicine visits in an effort to decrease unnecessary exposure to our patients, their families, and staff. These visits are billed to your insurance just like a normal visit is. We also encourage you to sign up for MyChart if you have not already done so. You will need a smartphone if possible. For patients that do not have this, we can still complete the visit using a regular telephone but do prefer a smartphone to enable video when possible. You may have a family member that lives with you that can help. If possible, we also ask that you have a blood pressure cuff and scale at home to measure your blood pressure, heart rate and weight prior to your scheduled appointment. Patients with clinical needs that need an in-person evaluation and testing will still be able to come to the office if absolutely necessary. If you have any questions, feel free to call our office.     YOUR PROVIDER WILL BE USING THE FOLLOWING PLATFORM TO COMPLETE YOUR VISIT: Doximity  . IF USING MYCHART - How to Download the MyChart App to Your SmartPhone   - If Apple, go to App Store and type in MyChart in the search bar and download the app. If Android, ask patient to go to Google Play Store and type in MyChart in the search bar and download the app. The app is free but as with any other app downloads, your phone may require you to verify saved payment information or Apple/Android password.  - You will need to then log into the app with your MyChart username and password, and select [facility] as your healthcare provider to link the account.  - When it is time for your visit, go to the MyChart app, find appointments, and click Begin Video Visit. Be sure to Select Allow for your device to  access the Microphone and Camera for your visit. You will then be connected, and your provider will be with you shortly.  **If you have any issues connecting or need assistance, please contact MyChart service desk [phone] ([phone])**  **If using a computer, in order to ensure the best quality for your visit, you will need to use either of the following Internet Browsers: Google Chrome or Microsoft Edge**  . IF USING DOXIMITY or [email or web address].ME - The staff will give you instructions on receiving your link to join the meeting the day of your visit.      2-3 DAYS BEFORE YOUR APPOINTMENT  You will receive a telephone call from one of our [facility] team members - your caller ID may say "Unknown caller." If this is a video visit, we will walk you through how to get the video launched on your phone. We will remind you check your blood pressure, heart rate and weight prior to your scheduled appointment. If you have an Apple Watch or Kardia, please upload any pertinent ECG strips the day before or morning of your appointment to MyChart. Our staff will also make sure you have reviewed the consent and agree to move forward with your scheduled tele-health visit.     THE DAY OF YOUR APPOINTMENT  Approximately 15 minutes prior to your scheduled appointment, you will receive a telephone call from one of [facility] team - your caller ID may say "Unknown caller."    Our staff will confirm medications, vital signs for the day and any symptoms you may be experiencing. Please have this information available prior to the time of visit start. It may also be helpful for you to have a pad of paper and pen handy for any instructions given during your visit. They will also walk you through joining the smartphone meeting if this is a video visit.    CONSENT FOR TELE-HEALTH VISIT - PLEASE REVIEW  I hereby voluntarily request, consent and authorize [facility] and its employed or contracted physicians, physician  assistants, nurse practitioners or other licensed health care professionals (the Practitioner), to provide me with telemedicine health care services (the "Services") as deemed necessary by the treating Practitioner. I acknowledge and consent to receive the Services by the Practitioner via telemedicine. I understand that the telemedicine visit will involve communicating with the Practitioner through live audiovisual communication technology and the disclosure of certain medical information by electronic transmission. I acknowledge that I have been given the opportunity to request an in-person assessment or other available alternative prior to the telemedicine visit and am voluntarily participating in the telemedicine visit.  I understand that I have the right to withhold or withdraw my consent to the use of telemedicine in the course of my care at any time, without affecting my right to future care or treatment, and that the Practitioner or I may terminate the telemedicine visit at any time. I understand that I have the right to inspect all information obtained and/or recorded in the course of the telemedicine visit and may receive copies of available information for a reasonable fee.  I understand that some of the potential risks of receiving the Services via telemedicine include:  . Delay or interruption in medical evaluation due to technological equipment failure or disruption; . Information transmitted may not be sufficient (e.g. poor resolution of images) to allow for appropriate medical decision making by the Practitioner; and/or  . In rare instances, security protocols could fail, causing a breach of personal health information.  Furthermore, I acknowledge that it is my responsibility to provide information about my medical history, conditions and care that is complete and accurate to the best of my ability. I acknowledge that Practitioner's advice, recommendations, and/or decision may be based on  factors not within their control, such as incomplete or inaccurate data provided by me or distortions of diagnostic images or specimens that may result from electronic transmissions. I understand that the practice of medicine is not an exact science and that Practitioner makes no warranties or guarantees regarding treatment outcomes. I acknowledge that I will receive a copy of this consent concurrently upon execution via email to the email address I last provided but may also request a printed copy by calling the office of [facility].    I understand that my insurance will be billed for this visit.   I have read or had this consent read to me. . I understand the contents of this consent, which adequately explains the benefits and risks of the Services being provided via telemedicine.  . I have been provided ample opportunity to ask questions regarding this consent and the Services and have had my questions answered to my satisfaction. . I give my informed consent for the services to be provided through the use of telemedicine in my medical care  By participating in this telemedicine visit I agree to the above.  

## 2019-01-25 ENCOUNTER — Other Ambulatory Visit: Payer: Self-pay | Admitting: Internal Medicine

## 2019-01-25 MED ORDER — ZOLPIDEM TARTRATE 10 MG PO TABS: 10.0000 mg | ORAL_TABLET | Freq: Every evening | ORAL | 2 refills | Status: DC | PRN

## 2019-01-25 MED FILL — ZOLPIDEM TARTRATE 10 MG TAB: 10 | 15 days supply | Qty: 15 | Fill #0

## 2019-01-25 MED FILL — ALPRAZolam 0.5 MG TABS: 0.5 | 30 days supply | Qty: 60 | Fill #1

## 2019-01-25 NOTE — Telephone Encounter (Signed)
Received Fax RX request from  [facility]  Medication - zolpidem (AMBIEN) 10 MG tablet   Last Refill - 01-11-19  Last OV - 12-01-18  Last CPE 09-19-17  Next CPE scheduled 04-23-19

## 2019-01-26 DIAGNOSIS — H1013 Acute atopic conjunctivitis, bilateral: Secondary | ICD-10-CM | POA: Diagnosis not present

## 2019-01-26 DIAGNOSIS — H40033 Anatomical narrow angle, bilateral: Secondary | ICD-10-CM | POA: Diagnosis not present

## 2019-01-26 NOTE — Progress Notes (Deleted)
Cardiology Office Note:    Date:  01/26/2019   ID:  [name] [name], DOB 1951/07/02, MRN [id number]  PCP:  Elby Showers, MD  Cardiologist:  Sinclair Grooms, MD   Referring MD: Elby Showers, MD   No chief complaint on file.   History of Present Illness:    [name] [name] is a 68 y.o. male with a hx of aortic atherosclerosis, type 2 diabetes, hypertension, and chronic combined systolic and diastolic heart failure.  LVEF less than 40% and actually decreased to 30% after instituting guideline directed medical therapy.  ***  Past Medical History:  Diagnosis Date  . Arthritis   . Diabetes mellitus without complication ([place])   . DJD (degenerative joint disease) of knee    Left  . Hypertension     Past Surgical History:  Procedure Laterality Date  . CARDIAC CATHETERIZATION  07/23/2018  . CHOLECYSTECTOMY N/A 02/04/2016   Procedure: LAPAROSCOPIC CHOLECYSTECTOMY;  Surgeon: Leighton Ruff, MD;  Location: [facility];  Service: General;  Laterality: N/A;  . ERCP N/A 02/02/2016   Procedure: ENDOSCOPIC RETROGRADE CHOLANGIOPANCREATOGRAPHY (ERCP);  Surgeon: [name], MD;  Location: [facility];  Service: Endoscopy;  Laterality: N/A;  . HERNIA REPAIR    . RIGHT/LEFT HEART CATH AND CORONARY ANGIOGRAPHY N/A 07/23/2018   Procedure: RIGHT/LEFT HEART CATH AND CORONARY ANGIOGRAPHY;  Surgeon: Belva Crome, MD;  Location: [facility];  Service: Cardiovascular;  Laterality: N/A;  . SPINE SURGERY    . TOTAL KNEE ARTHROPLASTY Left 07/28/2018   Procedure: TOTAL KNEE ARTHROPLASTY;  Surgeon: Melrose Nakayama, MD;  Location: [facility];  Service: Orthopedics;  Laterality: Left;    Current Medications: No outpatient medications have been marked as taking for the 01/27/19 encounter (Appointment) with Belva Crome, MD.     Allergies:   Patient has no known allergies.   Social History   Socioeconomic History  . Marital status: Married    Spouse name: Not on file  . Number of children:  Not on file  . Years of education: Not on file  . Highest education level: Not on file  Occupational History  . Not on file  Social Needs  . Financial resource strain: Not on file  . Food insecurity    Worry: Not on file    Inability: Not on file  . Transportation needs    Medical: Not on file    Non-medical: Not on file  Tobacco Use  . Smoking status: Never Smoker  . Smokeless tobacco: Never Used  Substance and Sexual Activity  . Alcohol use: No  . Drug use: No  . Sexual activity: Yes  Lifestyle  . Physical activity    Days per week: Not on file    Minutes per session: Not on file  . Stress: Not on file  Relationships  . Social Herbalist on phone: Not on file    Gets together: Not on file    Attends religious service: Not on file    Active member of club or organization: Not on file    Attends meetings of clubs or organizations: Not on file    Relationship status: Not on file  Other Topics Concern  . Not on file  Social History Narrative  . Not on file     Family History: The patient's family history includes Hypertension in his mother; Stroke in his mother.  ROS:   Please see the history of present illness.    ***  All other systems reviewed and are negative.  EKGs/Labs/Other Studies Reviewed:    The following studies were reviewed today: ***  EKG:  EKG ***  Recent Labs: 07/14/2018: Hemoglobin 13.7; Platelets 243 12/01/2018: BUN 10; Creatinine, Ser 0.89; Potassium 4.5; Sodium 140  Recent Lipid Panel    Component Value Date/Time   CHOL 148 03/23/2018 0942   TRIG 81 03/23/2018 0942   HDL 52 03/23/2018 0942   CHOLHDL 2.8 03/23/2018 0942   VLDL 14 10/18/2016 1048   LDLCALC 80 03/23/2018 0942    Physical Exam:    VS:  There were no vitals taken for this visit.    Wt Readings from Last 3 Encounters:  12/01/18 270 lb (122.5 kg)  11/22/18 276 lb (125.2 kg)  10/14/18 278 lb 6.4 oz (126.3 kg)     GEN: ***. No acute distress HEENT: Normal  NECK: No JVD. LYMPHATICS: No lymphadenopathy CARDIAC: *** RRR without murmur, gallop, or edema. VASCULAR: *** Normal Pulses. No bruits. RESPIRATORY:  Clear to auscultation without rales, wheezing or rhonchi  ABDOMEN: Soft, non-tender, non-distended, No pulsatile mass, MUSCULOSKELETAL: No deformity  SKIN: Warm and dry NEUROLOGIC:  Alert and oriented x 3 PSYCHIATRIC:  Normal affect   ASSESSMENT:    1. Chronic combined systolic and diastolic heart failure ([place])   2. Essential hypertension   3. PVC's (premature ventricular contractions)   4. Type 2 diabetes mellitus with complication, without long-term current use of insulin ([place])   5. Hyperlipidemia associated with type 2 diabetes mellitus ([place])   6. Erectile dysfunction, unspecified erectile dysfunction type   7. Class 1 obesity due to excess calories with serious comorbidity in adult, unspecified BMI    PLAN:    In order of problems listed above:  1. ***   Medication Adjustments/Labs and Tests Ordered: Current medicines are reviewed at length with the patient today.  Concerns regarding medicines are outlined above.  No orders of the defined types were placed in this encounter.  No orders of the defined types were placed in this encounter.   There are no Patient Instructions on file for this visit.   Signed, Sinclair Grooms, MD  01/26/2019 7:39 PM    [place]

## 2019-01-27 ENCOUNTER — Encounter: Payer: Self-pay | Admitting: Interventional Cardiology

## 2019-01-27 ENCOUNTER — Telehealth (INDEPENDENT_AMBULATORY_CARE_PROVIDER_SITE_OTHER): Payer: Medicare Other | Admitting: Interventional Cardiology

## 2019-01-27 ENCOUNTER — Other Ambulatory Visit: Payer: Self-pay

## 2019-01-27 VITALS — BP 162/91 | HR 66 | Ht 70.0 in | Wt 270.0 lb

## 2019-01-27 DIAGNOSIS — I10 Essential (primary) hypertension: Secondary | ICD-10-CM

## 2019-01-27 DIAGNOSIS — E1169 Type 2 diabetes mellitus with other specified complication: Secondary | ICD-10-CM

## 2019-01-27 DIAGNOSIS — I5042 Chronic combined systolic (congestive) and diastolic (congestive) heart failure: Secondary | ICD-10-CM | POA: Diagnosis not present

## 2019-01-27 DIAGNOSIS — E6609 Other obesity due to excess calories: Secondary | ICD-10-CM

## 2019-01-27 DIAGNOSIS — E118 Type 2 diabetes mellitus with unspecified complications: Secondary | ICD-10-CM

## 2019-01-27 DIAGNOSIS — N529 Male erectile dysfunction, unspecified: Secondary | ICD-10-CM

## 2019-01-27 DIAGNOSIS — E785 Hyperlipidemia, unspecified: Secondary | ICD-10-CM

## 2019-01-27 DIAGNOSIS — I493 Ventricular premature depolarization: Secondary | ICD-10-CM

## 2019-01-27 MED ORDER — SPIRONOLACTONE 25 MG PO TABS: 25.0000 mg | ORAL_TABLET | Freq: Every day | ORAL | 3 refills | Status: DC

## 2019-01-27 MED FILL — SPIRONOLACTONE 25 MG TABLET: 25 | 90 days supply | Qty: 90 | Fill #0

## 2019-01-27 NOTE — Patient Instructions (Signed)
Medication Instructions:  1) START Spironolactone 25mg  once daily  If you need a refill on your cardiac medications before your next appointment, please call your pharmacy.   Lab work: Your physician recommends that you return for lab work in: 1 week  If you have labs (blood work) drawn today and your tests are completely normal, you will receive your results only by: [name] MyChart Message (if you have MyChart) OR . A paper copy in the mail If you have any lab test that is abnormal or we need to change your treatment, we will call you to review the results.  Testing/Procedures: None  Follow-Up: At [facility], you and your health needs are our priority.  As part of our continuing mission to provide you with exceptional heart care, we have created designated Provider Care Teams.  These Care Teams include your primary Cardiologist (physician) and Advanced Practice Providers (APPs -  Physician Assistants and Nurse Practitioners) who all work together to provide you with the care you need, when you need it. You will need a follow up appointment in 4 months.  Please call our office 2 months in advance to schedule this appointment.  You may see Sinclair Grooms, MD or one of the following Advanced Practice Providers on your designated Care Team:   Truitt Merle, NP Cecilie Kicks, NP . Kathyrn Drown, NP  Any Other Special Instructions Will Be Listed Below (If Applicable).

## 2019-01-27 NOTE — Progress Notes (Signed)
Virtual Visit via Video Note   This visit type was conducted due to national recommendations for restrictions regarding the COVID-19 Pandemic (e.g. social distancing) in an effort to limit this patient's exposure and mitigate transmission in our community.  Due to his co-morbid illnesses, this patient is at least at moderate risk for complications without adequate follow up.  This format is felt to be most appropriate for this patient at this time.  All issues noted in this document were discussed and addressed.  A limited physical exam was performed with this format.  Please refer to the patient's chart for his consent to telehealth for [facility].   Date:  01/27/2019   ID:  [name] [name], DOB 1951-02-17, MRN [id number]  Patient Location: Home Provider Location: Home  PCP:  Elby Showers, MD  Cardiologist:  Sinclair Grooms, MD   Electrophysiologist:  None   Evaluation Performed:  Follow-Up Visit  Chief Complaint:  CHF  History of Present Illness:    [name] [name] is a 68 y.o. male with type 2 diabetes mellitus, hypertension, abdominal atherosclerosis, nonobstructive/minimal atherosclerosis in coronary arteries, severe obesity, and recently discovered systolic left ventricular heart failure with EF less than or equal to 35%.  [name] [name] feels well.  He is having no difficulty with breathing.  He denies palpitations and syncope.  No lower extremity swelling.  The patient does not have symptoms concerning for COVID-19 infection (fever, chills, cough, or new shortness of breath).    Past Medical History:  Diagnosis Date  . Arthritis   . Diabetes mellitus without complication ([place])   . DJD (degenerative joint disease) of knee    Left  . Hypertension    Past Surgical History:  Procedure Laterality Date  . CARDIAC CATHETERIZATION  07/23/2018  . CHOLECYSTECTOMY N/A 02/04/2016   Procedure: LAPAROSCOPIC CHOLECYSTECTOMY;  Surgeon: Leighton Ruff, MD;  Location: [facility];   Service: General;  Laterality: N/A;  . ERCP N/A 02/02/2016   Procedure: ENDOSCOPIC RETROGRADE CHOLANGIOPANCREATOGRAPHY (ERCP);  Surgeon: [name], MD;  Location: [facility];  Service: Endoscopy;  Laterality: N/A;  . HERNIA REPAIR    . RIGHT/LEFT HEART CATH AND CORONARY ANGIOGRAPHY N/A 07/23/2018   Procedure: RIGHT/LEFT HEART CATH AND CORONARY ANGIOGRAPHY;  Surgeon: Belva Crome, MD;  Location: [facility];  Service: Cardiovascular;  Laterality: N/A;  . SPINE SURGERY    . TOTAL KNEE ARTHROPLASTY Left 07/28/2018   Procedure: TOTAL KNEE ARTHROPLASTY;  Surgeon: Melrose Nakayama, MD;  Location: [facility];  Service: Orthopedics;  Laterality: Left;     No outpatient medications have been marked as taking for the 01/27/19 encounter (Telemedicine) with Belva Crome, MD.     Allergies:   Patient has no known allergies.   Social History   Tobacco Use  . Smoking status: Never Smoker  . Smokeless tobacco: Never Used  Substance Use Topics  . Alcohol use: No  . Drug use: No     Family Hx: The patient's family history includes Hypertension in his mother; Stroke in his mother.  ROS:   Please see the history of present illness.    He has been having allergies with tearing and irritated eyes. All other systems reviewed and are negative.   Prior CV studies:   The following studies were reviewed today:  No new data  Labs/Other Tests and Data Reviewed:    EKG:  No ECG reviewed.  Recent Labs: 07/14/2018: Hemoglobin 13.7; Platelets 243 12/01/2018: BUN 10; Creatinine, Ser  0.89; Potassium 4.5; Sodium 140   Recent Lipid Panel Lab Results  Component Value Date/Time   CHOL 148 03/23/2018 09:42 AM   TRIG 81 03/23/2018 09:42 AM   HDL 52 03/23/2018 09:42 AM   CHOLHDL 2.8 03/23/2018 09:42 AM   LDLCALC 80 03/23/2018 09:42 AM    Wt Readings from Last 3 Encounters:  01/27/19 270 lb (122.5 kg)  12/01/18 270 lb (122.5 kg)  11/22/18 276 lb (125.2 kg)     Objective:    Vital Signs:   BP (!) 162/91   Pulse 66   Ht 5' 10" (1.778 m)   Wt 270 lb (122.5 kg)   BMI 38.74 kg/m    VITAL SIGNS:  reviewed GEN:  Obese CARDIOVASCULAR:  no peripheral edema  ASSESSMENT & PLAN:    1. Chronic combined systolic and diastolic heart failure (H[facility])   2. Essential hypertension   3. PVC's (premature ventricular contractions)   4. Type 2 diabetes mellitus with complication, without long-term current use of insulin (H[facility])   5. Hyperlipidemia associated with type 2 diabetes mellitus ([facility])   6. Erectile dysfunction, unspecified erectile dysfunction type   7. Class 1 obesity due to excess calories with serious comorbidity in adult, unspecified BMI    PLAN:  1. He is on guideline directed therapy.  Blood pressure continues to be a problem. 2. Has spinal lactone 25 mg/day with be met in 1 week.  Monitor blood pressures at home. 3. Not addressed 4. Consider SGLT2 therapy 5. Target LDL less than 70 6. Not addressed 7. Diet, exercise, salt and fluid restriction discussed.  COVID-19 Education: The signs and symptoms of COVID-19 were discussed with the patient and how to seek care for testing (follow up with PCP or arrange E-visit).  The importance of social distancing was discussed today.  Time:   Today, I have spent 15 minutes with the patient with telehealth technology discussing the above problems.     Medication Adjustments/Labs and Tests Ordered: Current medicines are reviewed at length with the patient today.  Concerns regarding medicines are outlined above.   Tests Ordered: No orders of the defined types were placed in this encounter.   Medication Changes: No orders of the defined types were placed in this encounter.   Follow Up:  In Person in 3 month(s)  Signed, Henry W Smith III, MD  01/27/2019 7:58 AM    [facility] 

## 2019-02-01 ENCOUNTER — Telehealth: Payer: Self-pay | Admitting: Interventional Cardiology

## 2019-02-01 NOTE — Telephone Encounter (Signed)
Spoke with pt and made him aware that BP is better then it was at time of visit but still a little high.  Advised we need multiple readings.  Told pt to monitor BP 2-4 hrs after AM meds and let us know if consistently higher than 130/80.  Pt mentioned that yesterday he had a terrible HA in the side and back of his head from about 12-8P, finally fully resolved at [place].  Pt did not take BP at that time because he was afraid to.  Advised if he has this happen again, check BP and if it is elevated, call the office and let us know, even if after hours or weekend.  Pt verbalized understanding and was in agreement with this plan.

## 2019-02-01 NOTE — Telephone Encounter (Signed)
Patient called to give his BP reading as advised:  7/27 158/78 HR 63

## 2019-02-08 ENCOUNTER — Other Ambulatory Visit: Payer: Medicare Other | Admitting: *Deleted

## 2019-02-08 ENCOUNTER — Encounter (INDEPENDENT_AMBULATORY_CARE_PROVIDER_SITE_OTHER): Payer: Self-pay

## 2019-02-08 ENCOUNTER — Other Ambulatory Visit: Payer: Self-pay

## 2019-02-08 DIAGNOSIS — I5042 Chronic combined systolic (congestive) and diastolic (congestive) heart failure: Secondary | ICD-10-CM

## 2019-02-08 DIAGNOSIS — I10 Essential (primary) hypertension: Secondary | ICD-10-CM | POA: Diagnosis not present

## 2019-02-08 LAB — BASIC METABOLIC PANEL
BUN/Creatinine Ratio: 13 (ref 10–24)
BUN: 13 mg/dL (ref 8–27)
CO2: 22 mmol/L (ref 20–29)
Calcium: 9.6 mg/dL (ref 8.6–10.2)
Chloride: 103 mmol/L (ref 96–106)
Creatinine, Ser: 1.03 mg/dL (ref 0.76–1.27)
GFR calc Af Amer: 87 mL/min/{1.73_m2} (ref >59)
GFR calc non Af Amer: 75 mL/min/{1.73_m2} (ref >59)
Glucose: 116 mg/dL — ABNORMAL HIGH (ref 65–99)
Potassium: 4.4 mmol/L (ref 3.5–5.2)
Sodium: 141 mmol/L (ref 134–144)

## 2019-02-09 ENCOUNTER — Other Ambulatory Visit: Payer: Self-pay

## 2019-02-09 ENCOUNTER — Other Ambulatory Visit: Payer: Self-pay | Admitting: Interventional Cardiology

## 2019-02-09 MED ORDER — ATORVASTATIN CALCIUM 10 MG PO TABS: 10.0000 mg | ORAL_TABLET | Freq: Every day | ORAL | 3 refills | Status: DC

## 2019-02-09 MED ORDER — SACUBITRIL-VALSARTAN 97-103 MG PO TABS: 1.0000 | ORAL_TABLET | Freq: Two times a day (BID) | ORAL | 3 refills | Status: DC

## 2019-02-09 MED ORDER — ZOLPIDEM TARTRATE 10 MG PO TABS: 10.0000 mg | ORAL_TABLET | Freq: Every evening | ORAL | 2 refills | Status: DC | PRN

## 2019-02-09 MED ORDER — ESOMEPRAZOLE MAGNESIUM 40 MG PO CPDR: 40.0000 mg | DELAYED_RELEASE_CAPSULE | Freq: Every day | ORAL | 3 refills | Status: DC

## 2019-02-09 MED ORDER — FUROSEMIDE 40 MG PO TABS: ORAL_TABLET | ORAL | 3 refills | Status: DC

## 2019-02-09 MED ORDER — METFORMIN HCL ER 500 MG PO TB24: 500.0000 mg | ORAL_TABLET | Freq: Two times a day (BID) | ORAL | 3 refills | Status: DC

## 2019-02-09 MED ORDER — ALPRAZOLAM 0.5 MG PO TABS: ORAL_TABLET | ORAL | 5 refills | Status: DC

## 2019-02-09 MED ORDER — AMLODIPINE BESYLATE 10 MG PO TABS: 10.0000 mg | ORAL_TABLET | Freq: Every day | ORAL | 3 refills | Status: DC

## 2019-02-09 MED ORDER — SPIRONOLACTONE 25 MG PO TABS: 25.0000 mg | ORAL_TABLET | Freq: Every day | ORAL | 3 refills | Status: DC

## 2019-02-09 MED ORDER — CARVEDILOL 25 MG PO TABS: 25.0000 mg | ORAL_TABLET | Freq: Two times a day (BID) | ORAL | 3 refills | Status: DC

## 2019-02-09 MED ORDER — MELOXICAM 15 MG PO TABS: 15.0000 mg | ORAL_TABLET | Freq: Every day | ORAL | 3 refills | Status: DC

## 2019-02-10 MED FILL — tiZANidine HCL 4 MG TABS: 4 | 10 days supply | Qty: 40 | Fill #0

## 2019-02-12 ENCOUNTER — Telehealth: Payer: Self-pay | Admitting: Interventional Cardiology

## 2019-02-12 ENCOUNTER — Telehealth: Payer: Self-pay | Admitting: Internal Medicine

## 2019-02-12 NOTE — Telephone Encounter (Signed)
Fax Medication list to [facility] fax number [phone]

## 2019-02-12 NOTE — Telephone Encounter (Signed)
[name] [name] called to see if we could fax his medication list to his new pharmacy [facility]

## 2019-02-12 NOTE — Telephone Encounter (Signed)
  Patient would like all of his medications to be sent to [facility] mail order to be filled

## 2019-02-17 ENCOUNTER — Ambulatory Visit: Payer: Medicare Other | Admitting: Interventional Cardiology

## 2019-02-17 MED FILL — ENTRESTO 97 MG-103 MG TAB: 97-103 | 30 days supply | Qty: 60 | Fill #2

## 2019-02-17 MED FILL — metFORMIN HCL ER 500 MG TB2: 500 | 90 days supply | Qty: 180 | Fill #0

## 2019-03-09 ENCOUNTER — Telehealth: Payer: Self-pay | Admitting: Interventional Cardiology

## 2019-03-09 NOTE — Telephone Encounter (Signed)
New Message   Patient is requesting a call back from the nurse. He states that he would like to discuss his BP and medication concerns.

## 2019-03-09 NOTE — Telephone Encounter (Signed)
Spoke with pt and he just wanted to let me know that he will NOT be using [facility] for prescriptions due to some issues he had with shipping.  He also wanted to let us know that his BPs have been running low 130s/72-75.  Advised to continue to monitor and let us know if anything changes before he comes back to see [place] in Nov.  Pt appreciative for call.

## 2019-03-19 MED FILL — ALPRAZolam 0.5 MG TABS: 0.5 | 30 days supply | Qty: 60 | Fill #2

## 2019-03-19 MED FILL — ZOLPIDEM TARTRATE 10 MG TAB: 10 | 15 days supply | Qty: 15 | Fill #1

## 2019-03-19 MED FILL — ENTRESTO 97 MG-103 MG TAB: 97-103 | 30 days supply | Qty: 60 | Fill #3

## 2019-04-19 ENCOUNTER — Other Ambulatory Visit: Payer: Self-pay

## 2019-04-19 ENCOUNTER — Other Ambulatory Visit: Payer: Medicare Other | Admitting: Internal Medicine

## 2019-04-19 DIAGNOSIS — G4700 Insomnia, unspecified: Secondary | ICD-10-CM

## 2019-04-19 DIAGNOSIS — E785 Hyperlipidemia, unspecified: Secondary | ICD-10-CM

## 2019-04-19 DIAGNOSIS — I700 Atherosclerosis of aorta: Secondary | ICD-10-CM

## 2019-04-19 DIAGNOSIS — I5042 Chronic combined systolic (congestive) and diastolic (congestive) heart failure: Secondary | ICD-10-CM

## 2019-04-19 DIAGNOSIS — H1013 Acute atopic conjunctivitis, bilateral: Secondary | ICD-10-CM | POA: Diagnosis not present

## 2019-04-19 DIAGNOSIS — I10 Essential (primary) hypertension: Secondary | ICD-10-CM

## 2019-04-19 DIAGNOSIS — E119 Type 2 diabetes mellitus without complications: Secondary | ICD-10-CM

## 2019-04-19 DIAGNOSIS — E118 Type 2 diabetes mellitus with unspecified complications: Secondary | ICD-10-CM | POA: Diagnosis not present

## 2019-04-19 DIAGNOSIS — Z0000 Encounter for general adult medical examination without abnormal findings: Secondary | ICD-10-CM

## 2019-04-19 DIAGNOSIS — F411 Generalized anxiety disorder: Secondary | ICD-10-CM

## 2019-04-19 DIAGNOSIS — E1169 Type 2 diabetes mellitus with other specified complication: Secondary | ICD-10-CM | POA: Diagnosis not present

## 2019-04-19 DIAGNOSIS — G0402 Postimmunization acute disseminated encephalitis, myelitis and encephalomyelitis: Secondary | ICD-10-CM | POA: Diagnosis not present

## 2019-04-19 DIAGNOSIS — M170 Bilateral primary osteoarthritis of knee: Secondary | ICD-10-CM

## 2019-04-20 LAB — CBC WITH DIFFERENTIAL/PLATELET
Absolute Monocytes: 470 cells/uL (ref 200–950)
Basophils Absolute: 42 cells/uL (ref 0–200)
Basophils Relative: 1 %
Eosinophils Absolute: 80 cells/uL (ref 15–500)
Eosinophils Relative: 1.9 %
HCT: 42.9 % (ref 38.5–50.0)
Hemoglobin: 14.3 g/dL (ref 13.2–17.1)
Lymphs Abs: 2289 cells/uL (ref 850–3900)
MCH: 31.4 pg (ref 27.0–33.0)
MCHC: 33.3 g/dL (ref 32.0–36.0)
MCV: 94.3 fL (ref 80.0–100.0)
MPV: 10.5 fL (ref 7.5–12.5)
Monocytes Relative: 11.2 %
Neutro Abs: 1319 cells/uL — ABNORMAL LOW (ref 1500–7800)
Neutrophils Relative %: 31.4 %
Platelets: 250 10*3/uL (ref 140–400)
RBC: 4.55 10*6/uL (ref 4.20–5.80)
RDW: 12.3 % (ref 11.0–15.0)
Total Lymphocyte: 54.5 %
WBC: 4.2 10*3/uL (ref 3.8–10.8)

## 2019-04-20 LAB — MICROALBUMIN / CREATININE URINE RATIO
Creatinine, Urine: 28 mg/dL (ref 20–320)
Microalb, Ur: <0.2 mg/dL

## 2019-04-20 LAB — COMPLETE METABOLIC PANEL WITH GFR
AG Ratio: 1.3 (calc) (ref 1.0–2.5)
ALT: 11 U/L (ref 9–46)
AST: 15 U/L (ref 10–35)
Albumin: 4.3 g/dL (ref 3.6–5.1)
Alkaline phosphatase (APISO): 68 U/L (ref 35–144)
BUN: 12 mg/dL (ref 7–25)
CO2: 27 mmol/L (ref 20–32)
Calcium: 9.9 mg/dL (ref 8.6–10.3)
Chloride: 102 mmol/L (ref 98–110)
Creat: 1.09 mg/dL (ref 0.70–1.25)
GFR, Est African American: 82 mL/min/{1.73_m2} (ref >=60)
GFR, Est Non African American: 70 mL/min/{1.73_m2} (ref >=60)
Globulin: 3.2 g/dL (calc) (ref 1.9–3.7)
Glucose, Bld: 109 mg/dL — ABNORMAL HIGH (ref 65–99)
Potassium: 4.2 mmol/L (ref 3.5–5.3)
Sodium: 138 mmol/L (ref 135–146)
Total Bilirubin: 1.3 mg/dL — ABNORMAL HIGH (ref 0.2–1.2)
Total Protein: 7.5 g/dL (ref 6.1–8.1)

## 2019-04-20 LAB — LIPID PANEL
Cholesterol: 135 mg/dL (ref <200)
HDL: 53 mg/dL (ref >=40)
LDL Cholesterol (Calc): 64 mg/dL (calc)
Non-HDL Cholesterol (Calc): 82 mg/dL (calc) (ref <130)
Total CHOL/HDL Ratio: 2.5 (calc) (ref <5.0)
Triglycerides: 97 mg/dL (ref <150)

## 2019-04-20 LAB — HEMOGLOBIN A1C
Hgb A1c MFr Bld: 6.2 % of total Hgb — ABNORMAL HIGH (ref <5.7)
Mean Plasma Glucose: 131 (calc)
eAG (mmol/L): 7.3 (calc)

## 2019-04-20 LAB — PSA: PSA: 0.7 ng/mL (ref <=4.0)

## 2019-04-22 ENCOUNTER — Encounter: Payer: Medicare Other | Admitting: Internal Medicine

## 2019-04-23 ENCOUNTER — Other Ambulatory Visit: Payer: Self-pay | Admitting: Interventional Cardiology

## 2019-04-23 ENCOUNTER — Ambulatory Visit (INDEPENDENT_AMBULATORY_CARE_PROVIDER_SITE_OTHER): Payer: Medicare Other | Admitting: Internal Medicine

## 2019-04-23 ENCOUNTER — Encounter: Payer: Self-pay | Admitting: Internal Medicine

## 2019-04-23 ENCOUNTER — Other Ambulatory Visit: Payer: Self-pay

## 2019-04-23 VITALS — BP 150/90 | HR 80 | Temp 98.0°F | Ht 70.0 in | Wt 280.0 lb

## 2019-04-23 DIAGNOSIS — E1169 Type 2 diabetes mellitus with other specified complication: Secondary | ICD-10-CM

## 2019-04-23 DIAGNOSIS — E785 Hyperlipidemia, unspecified: Secondary | ICD-10-CM

## 2019-04-23 DIAGNOSIS — Z8739 Personal history of other diseases of the musculoskeletal system and connective tissue: Secondary | ICD-10-CM

## 2019-04-23 DIAGNOSIS — Z23 Encounter for immunization: Secondary | ICD-10-CM

## 2019-04-23 DIAGNOSIS — Z0000 Encounter for general adult medical examination without abnormal findings: Secondary | ICD-10-CM | POA: Diagnosis not present

## 2019-04-23 DIAGNOSIS — I700 Atherosclerosis of aorta: Secondary | ICD-10-CM

## 2019-04-23 DIAGNOSIS — I10 Essential (primary) hypertension: Secondary | ICD-10-CM

## 2019-04-23 DIAGNOSIS — H1013 Acute atopic conjunctivitis, bilateral: Secondary | ICD-10-CM

## 2019-04-23 DIAGNOSIS — G4700 Insomnia, unspecified: Secondary | ICD-10-CM

## 2019-04-23 DIAGNOSIS — M1711 Unilateral primary osteoarthritis, right knee: Secondary | ICD-10-CM

## 2019-04-23 DIAGNOSIS — Z6841 Body Mass Index (BMI) 40.0 and over, adult: Secondary | ICD-10-CM

## 2019-04-23 DIAGNOSIS — Z125 Encounter for screening for malignant neoplasm of prostate: Secondary | ICD-10-CM

## 2019-04-23 DIAGNOSIS — F411 Generalized anxiety disorder: Secondary | ICD-10-CM | POA: Diagnosis not present

## 2019-04-23 DIAGNOSIS — I5042 Chronic combined systolic (congestive) and diastolic (congestive) heart failure: Secondary | ICD-10-CM

## 2019-04-23 LAB — POCT URINALYSIS DIPSTICK
Appearance: NEGATIVE
Bilirubin, UA: NEGATIVE
Blood, UA: NEGATIVE
Glucose, UA: NEGATIVE
Ketones, UA: NEGATIVE
Leukocytes, UA: NEGATIVE
Nitrite, UA: NEGATIVE
Odor: NEGATIVE
Protein, UA: NEGATIVE
Spec Grav, UA: 1.01 (ref 1.010–1.025)
Urobilinogen, UA: 0.2 E.U./dL
pH, UA: 6.5 (ref 5.0–8.0)

## 2019-04-23 MED ORDER — OLOPATADINE HCL 0.7 % OP SOLN: 1.0000 [drp] | Freq: Every day | OPHTHALMIC | 99 refills | Status: DC

## 2019-04-23 MED ORDER — ZOLPIDEM TARTRATE 5 MG PO TABS: 5.0000 mg | ORAL_TABLET | Freq: Every evening | ORAL | 1 refills | Status: DC | PRN

## 2019-04-23 MED FILL — SPIRONOLACTONE 25 MG TABS: 25 | 90 days supply | Qty: 90 | Fill #1

## 2019-04-23 MED FILL — ALPRAZolam 0.5 MG TABS: 0.5 | 30 days supply | Qty: 60 | Fill #3

## 2019-04-23 MED FILL — ZOLPIDEM TARTRATE 5 MG TAB: 5 | 90 days supply | Qty: 90 | Fill #0

## 2019-04-23 MED FILL — CARVEDILOL 25 MG TABLET: 25 | 90 days supply | Qty: 180 | Fill #0

## 2019-04-23 MED FILL — PAZEO 0.7% EYE DROPS: 0.7 | 25 days supply | Qty: 3 | Fill #0

## 2019-04-23 MED FILL — ENTRESTO 97 MG-103 MG TAB: 97-103 | 30 days supply | Qty: 60 | Fill #4

## 2019-04-23 NOTE — Telephone Encounter (Signed)
° ° ° °*  STAT* If patient is at the pharmacy, call can be transferred to refill team.   1. Which medications need to be refilled? (please list name of each medication and dose if known) carvedilol (COREG) 25 MG tablet  2. Which pharmacy/location (including street and city if local pharmacy) is medication to be sent to?[place]  3. Do they need a 30 day or 90 day supply? [place]

## 2019-04-23 NOTE — Patient Instructions (Signed)
Please continue to work on diet exercise and weight loss.  Discontinue prednisolone eyedrops.  Continue current medications and follow-up in 6 months.  Flu vaccine given today.  Return in 2 weeks for Prevnar 13.

## 2019-04-23 NOTE — Progress Notes (Signed)
Subjective:    Patient ID: [name] [name], male    DOB: [birth year], 68 y.o.   MRN: [id number]  HPI 68 year old Male for Welcome to Medicare physical exam and visit.  History of diabetes, hypertension, obesity, gout, GE reflux erectile dysfunction.  In 2008 he weighed 300 pounds had elevated liver functions and at that time was felt to have fatty liver.  These improved after he lost 18 pounds.  History of gout involving right toe midfoot but not recently.  History of low testosterone level in 2011.  AndroGel was prescribed but I do not think he needs anxiety any longer.  History of Schatzki's ring dilated by Dr. Carlean Purl in 2008.  Hospitalized with acute cholecystitis July 2017.  He had an ERCP and laparoscopic cholecystectomy.  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  Wife is retired from the school system.  They are active in their church.  He does not consume alcohol.  1 son with history of kidney transplant due to hypertension and chronic kidney disease.  3 adult children, 2 sons and a daughter.  Family history: Mother died at age 29 in 75 of a CVA.  He is also followed by Dr. Pernell Dupre.  Noted to have abdominal atherosclerosis, nonobstructive minimal atherosclerosis in the coronary arteries, systolic left ventricular heart failure with ejection fraction less than or equal to 35%.  Has had PVCs on EKG.  Welcome to Medicare EKG shows first-degree AV block previously seen on EKG January 2020.  Nonspecific ST-T wave changes but none ectopy    Left knee arthroplasty by Dr. Latanya Maudlin January 2020.  Needs right knee arthroplasty but wants to defer COVID-19 pandemic Review of Systems Right knee pain, history of insomnia    Objective:   Physical Exam Blood pressure 150/90 pulse 90 temperature 98 degrees pulse oximetry 97% weight 290 pounds BMI 40.18 Skin warm and dry.  Nodes none.  TMs and pharynx are clear.  Neck is supple.   No carotid bruits.  Chest clear to auscultation.  Cardiac exam regular rate and rhythm.  Prostate is normal without nodules.  Abdomen obese no hepatosplenomegaly masses or tenderness appreciated.  No pitting edema of the lower extremities.  No focal deficits on brief neurological exam.      Assessment & Plan:  Combined systolic and diastolic congestive heart failure followed by Dr. Pernell Dupre  Diabetes mellitus-control is good at 6.2%.  We discussed SGLT2 therapy but patient declines at this time.  Essential hypertension  History of PVCs  Hyperlipidemia associated with diabetes  Morbid obesity  Plan: Patient will continue current medications and follow-up in 6 months.  He will work on diet exercise and weight loss.  He has allergic conjunctivitis and eyedrops prescribed by ophthalmologist will be refilled.  Flu vaccine given.  Subjective:   Patient presents for Medicare Annual/Subsequent preventive examination.  Review Past Medical/Family/Social: See above   Risk Factors  Current exercise habits: Some light exercise with walking and gardening Dietary issues discussed: Low-fat low carbohydrate  Cardiac risk factors: Diabetes  Depression Screen  (Note: if answer to either of the following is "Yes", a more complete depression screening is indicated)   Over the past two weeks, have you felt down, depressed or hopeless? No  Over the past two weeks, have you felt little interest or pleasure in doing things? No Have you lost interest or pleasure in daily life? No Do you often feel hopeless? No Do  you cry easily over simple problems? No   Activities of Daily Living  In your present state of health, do you have any difficulty performing the following activities?:   Driving? No  Managing money? No  Feeding yourself? No  Getting from bed to chair? No  Climbing a flight of stairs?  Yes due to knee issue Preparing food and eating?: No  Bathing or showering? No  Getting dressed:  No  Getting to the toilet? No  Using the toilet:No  Moving around from place to place: yes due to knee issue In the past year have you fallen or had a near fall? yes Are you sexually active? yes Do you have more than one partner? No   Hearing Difficulties: No  Do you often ask people to speak up or repeat themselves? No  Do you experience ringing or noises in your ears? No  Do you have difficulty understanding soft or whispered voices? No  Do you feel that you have a problem with memory? No Do you often misplace items? No    Home Safety:  Do you have a smoke alarm at your residence? Yes Do you have grab bars in the bathroom?  No Do you have throw rugs in your house?  Yes   Cognitive Testing  Alert? Yes Normal Appearance?Yes  Oriented to person? Yes Place? Yes  Time? Yes  Recall of three objects? Yes  Can perform simple calculations? Yes  Displays appropriate judgment?Yes  Can read the correct time from a watch face?Yes   List the Names of Other Physician/Practitioners you currently use:  See referral list for the physicians patient is currently seeing.  Dr. Pernell Dupre  Dr. Latanya Maudlin   Review of Systems: See above   Objective:     General appearance: Appears stated age and obese  Head: Normocephalic, without obvious abnormality, atraumatic  Eyes: conj clear, EOMi PEERLA  Ears: normal TM's and external ear canals both ears  Nose: Nares normal. Septum midline. Mucosa normal. No drainage or sinus tenderness.  Throat: lips, mucosa, and tongue normal; teeth and gums normal  Neck: no adenopathy, no carotid bruit, no JVD, supple, symmetrical, trachea midline and thyroid not enlarged, symmetric, no tenderness/mass/nodules  No CVA tenderness.  Lungs: clear to auscultation bilaterally  Breasts: normal appearance, no masses or tenderness.  Heart: regular rate and rhythm, S1, S2 normal, no murmur, click, rub or gallop  Abdomen: soft, non-tender; bowel sounds normal; no masses,  no organomegaly  Musculoskeletal: ROM normal in all joints, no crepitus, no deformity, Normal muscle strengthen. Back  is symmetric, no curvature. Skin: Skin color, texture, turgor normal. No rashes or lesions  Lymph nodes: Cervical, supraclavicular, and axillary nodes normal.  Neurologic: CN 2 -12 Normal, Normal symmetric reflexes. Normal coordination and gait  Psych: Alert & Oriented x 3, Mood appear stable.    Assessment:    Annual wellness medicare exam   Plan:    During the course of the visit the patient was educated and counseled about appropriate screening and preventive services including:   Annual flu vaccine.  To get Prevnar 13 in the near future.     Patient Instructions (the written plan) was given to the patient.  Medicare Attestation  I have personally reviewed:  The patient's medical and social history  Their use of alcohol, tobacco or illicit drugs  Their current medications and supplements  The patient's functional ability including ADLs,fall risks, home safety risks, cognitive, and hearing and visual impairment  Diet and  physical activities  Evidence for depression or mood disorders  The patient's weight, height, BMI, and visual acuity have been recorded in the chart. I have made referrals, counseling, and provided education to the patient based on review of the above and I have provided the patient with a written personalized care plan for preventive services.

## 2019-05-07 ENCOUNTER — Other Ambulatory Visit: Payer: Self-pay

## 2019-05-07 ENCOUNTER — Encounter: Payer: Self-pay | Admitting: Internal Medicine

## 2019-05-07 ENCOUNTER — Ambulatory Visit (INDEPENDENT_AMBULATORY_CARE_PROVIDER_SITE_OTHER): Payer: Medicare Other | Admitting: Internal Medicine

## 2019-05-07 VITALS — Temp 98.6°F | Ht 70.0 in | Wt 280.0 lb

## 2019-05-07 DIAGNOSIS — Z23 Encounter for immunization: Secondary | ICD-10-CM

## 2019-05-07 MED ORDER — PANTOPRAZOLE SODIUM 40 MG PO TBEC: 40.0000 mg | DELAYED_RELEASE_TABLET | Freq: Every day | ORAL | 3 refills | Status: DC

## 2019-05-07 MED FILL — PANTOPRAZOLE SOD DR 40 MG T: 40 | 90 days supply | Qty: 90 | Fill #0

## 2019-05-07 NOTE — Patient Instructions (Signed)
Patient received a Prevnar 13 injection, L deltoid, IM

## 2019-05-07 NOTE — Progress Notes (Signed)
Prevnar 13 given in vehicle left arm. Because of cost changing PPI to Pantoprazole instead of generic nexium

## 2019-05-07 NOTE — Addendum Note (Signed)
Addended by: Mady Haagensen on: 05/07/2019 04:41 PM   Modules accepted: Orders

## 2019-05-20 ENCOUNTER — Other Ambulatory Visit: Payer: Self-pay | Admitting: Internal Medicine

## 2019-05-20 MED FILL — ENTRESTO 97 MG-103 MG TAB: 97-103 | 30 days supply | Qty: 60 | Fill #5

## 2019-05-20 MED FILL — FUROSEMIDE 40 MG TAB: 40 | 90 days supply | Qty: 90 | Fill #1

## 2019-05-20 MED FILL — AMLODIPINE BESYLATE 10 MG T: 10 | 90 days supply | Qty: 90 | Fill #2

## 2019-05-20 MED FILL — MELOXICAM 15 MG TABLET: 15 | 90 days supply | Qty: 90 | Fill #1

## 2019-05-20 MED FILL — METFORMIN HCL ER 500 MG TB2: 500 | 90 days supply | Qty: 180 | Fill #0

## 2019-05-20 MED FILL — ATORVASTATIN 10 MG TABLET: 10 | 90 days supply | Qty: 90 | Fill #3

## 2019-05-21 MED FILL — ALPRAZolam 0.5 MG TABS: 0.5 | 30 days supply | Qty: 60 | Fill #4

## 2019-05-26 NOTE — Progress Notes (Signed)
Cardiology Office Note:    Date:  05/27/2019   ID:  [name] [name], DOB 12-Nov-1950, MRN [id number]  PCP:  [name] Showers, MD  Cardiologist:  [name] Grooms, MD   Referring MD: [name] Showers, MD   Chief Complaint  Patient presents with  . Congestive Heart Failure  . Hypertension    History of Present Illness:    [name] [name] is a 68 y.o. male with a hx of type 2 diabetes mellitus, hypertension, abdominal atherosclerosis, nonobstructive/minimal atherosclerosis in coronary arteries, severe obesity, and recently discovered systolic left ventricular heart failure with EF less than or equal to 35%.  [name] is now completely retired.  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  We have switched his medications around because of LV dysfunction.  Overall the blood pressure is not quite as good as prior to the switch.  We will continue to follow.  He needs to lose weight.  We discussed salt in his diet.  There is no orthopnea, PND, lower extremity swelling, or other complaints.  Past Medical History:  Diagnosis Date  . Arthritis   . Diabetes mellitus without complication ([place])   . DJD (degenerative joint disease) of knee    Left  . Hypertension     Past Surgical History:  Procedure Laterality Date  . CARDIAC CATHETERIZATION  07/23/2018  . CHOLECYSTECTOMY N/A 02/04/2016   Procedure: LAPAROSCOPIC CHOLECYSTECTOMY;  Surgeon: Leighton Ruff, MD;  Location: [facility];  Service: General;  Laterality: N/A;  . ERCP N/A 02/02/2016   Procedure: ENDOSCOPIC RETROGRADE CHOLANGIOPANCREATOGRAPHY (ERCP);  Surgeon: [name], MD;  Location: [facility];  Service: Endoscopy;  Laterality: N/A;  . HERNIA REPAIR    . RIGHT/LEFT HEART CATH AND CORONARY ANGIOGRAPHY N/A 07/23/2018   Procedure: RIGHT/LEFT HEART CATH AND CORONARY ANGIOGRAPHY;  Surgeon: Belva Crome, MD;  Location: [facility] [facility];  Service: Cardiovascular;  Laterality: N/A;  . SPINE  SURGERY    . TOTAL KNEE ARTHROPLASTY Left 07/28/2018   Procedure: TOTAL KNEE ARTHROPLASTY;  Surgeon: Melrose Nakayama, MD;  Location: [facility];  Service: Orthopedics;  Laterality: Left;    Current Medications: Current Meds  Medication Sig  . ALPRAZolam (XANAX) 0.5 MG tablet TAKE 1 TABLET BY MOUTH TWICE A DAY AS NEEDED FOR ANXIETY  . amLODipine (NORVASC) 10 MG tablet Take 1 tablet (10 mg total) by mouth daily.  [name] aspirin EC 81 MG tablet Take 1 tablet (81 mg total) by mouth daily.  [name] atorvastatin (LIPITOR) 10 MG tablet Take 1 tablet (10 mg total) by mouth daily.  . carvedilol (COREG) 25 MG tablet TAKE 1 TABLET BY MOUTH 2 TIMES DAILY.  . furosemide (LASIX) 40 MG tablet TAKE 1 TABLET BY MOUTH DAILY  . meloxicam (MOBIC) 15 MG tablet Take 1 tablet (15 mg total) by mouth daily.  . metFORMIN (GLUCOPHAGE-XR) 500 MG 24 hr tablet TAKE 1 TABLET BY MOUTH TWICE A DAY  . Olopatadine HCl 0.7 % SOLN Apply 1 drop to eye daily.  . pantoprazole (PROTONIX) 40 MG tablet Take 1 tablet (40 mg total) by mouth daily.  . sacubitril-valsartan (ENTRESTO) 97-103 MG Take 1 tablet by mouth 2 (two) times daily.  [name] spironolactone (ALDACTONE) 25 MG tablet Take 1 tablet (25 mg total) by mouth 2 (two) times daily.  [name] tiZANidine (ZANAFLEX) 4 MG tablet Take 1 tablet (4 mg total) by mouth every 6 (six) hours as needed.  . zolpidem (AMBIEN) 5 MG  tablet Take 1 tablet (5 mg total) by mouth at bedtime as needed for sleep.  . [DISCONTINUED] sacubitril-valsartan (ENTRESTO) 97-103 MG Take 1 tablet by mouth 2 (two) times daily.  . [DISCONTINUED] spironolactone (ALDACTONE) 25 MG tablet Take 1 tablet (25 mg total) by mouth daily.     Allergies:   Patient has no known allergies.   Social History   Socioeconomic History  . Marital status: Married    Spouse name: Not on file  . Number of children: Not on file  . Years of education: Not on file  . Highest education level: Not on file  Occupational History  . Not on file  Social Needs   . Financial resource strain: Not on file  . Food insecurity    Worry: Not on file    Inability: Not on file  . Transportation needs    Medical: Not on file    Non-medical: Not on file  Tobacco Use  . Smoking status: Never Smoker  . Smokeless tobacco: Never Used  Substance and Sexual Activity  . Alcohol use: No  . Drug use: No  . Sexual activity: Yes  Lifestyle  . Physical activity    Days per week: Not on file    Minutes per session: Not on file  . Stress: Not on file  Relationships  . Social Herbalist on phone: Not on file    Gets together: Not on file    Attends religious service: Not on file    Active member of club or organization: Not on file    Attends meetings of clubs or organizations: Not on file    Relationship status: Not on file  Other Topics Concern  . Not on file  Social History Narrative  . Not on file     Family History: The patient's family history includes Hypertension in his mother; Stroke in his mother.  ROS:   Please see the history of present illness.    Medications on an updated heart failure regimen have been difficult for the patient to afford since retirement.  He is still willing to hang in there and will let me know if financially it becomes over bearing.  All other systems reviewed and are negative.  EKGs/Labs/Other Studies Reviewed:    The following studies were reviewed today: No new data  EKG:  EKG not repeated  Recent Labs: 04/19/2019: ALT 11; BUN 12; Creat 1.09; Hemoglobin 14.3; Platelets 250; Potassium 4.2; Sodium 138  Recent Lipid Panel    Component Value Date/Time   CHOL 135 04/19/2019 0907   TRIG 97 04/19/2019 0907   HDL 53 04/19/2019 0907   CHOLHDL 2.5 04/19/2019 0907   VLDL 14 10/18/2016 1048   LDLCALC 64 04/19/2019 0907    Physical Exam:    VS:  BP (!) 158/86   Pulse 72   Ht 5\' 10"  (1.778 m)   Wt 291 lb 12.8 oz (132.4 kg)   SpO2 98%   BMI 41.87 kg/m     Wt Readings from Last 3 Encounters:   05/27/19 291 lb 12.8 oz (132.4 kg)  05/07/19 280 lb (127 kg)  04/23/19 280 lb (127 kg)     GEN: Obese. No acute distress HEENT: Normal NECK: No JVD. LYMPHATICS: No lymphadenopathy CARDIAC:  RRR without murmur, gallop, or edema. VASCULAR:  Normal Pulses. No bruits. RESPIRATORY:  Clear to auscultation without rales, wheezing or rhonchi  ABDOMEN: Soft, non-tender, non-distended, No pulsatile mass, MUSCULOSKELETAL: No deformity  SKIN: Warm  and dry NEUROLOGIC:  Alert and oriented x 3 PSYCHIATRIC:  Normal affect   ASSESSMENT:    1. Chronic combined systolic and diastolic heart failure ([place])   2. Essential hypertension   3. PVC's (premature ventricular contractions)   4. Type 2 diabetes mellitus with complication, without long-term current use of insulin ([place])   5. Hyperlipidemia associated with type 2 diabetes mellitus ([place])   6. Educated about COVID-19 virus infection    PLAN:    In order of problems listed above:  1. There is no evidence of volume overload on exam.  Guideline directed therapy for systolic heart failure has been instituted.  A 2D Doppler echocardiogram will be done prior to the next office visit in 6 months. 2. Still elevated.  Will increase Aldactone 50 mg/day as 25 mg twice daily.  A basic metabolic panel in 7 to 10 days to exclude renal dysfunction and hyperkalemia. 3. Not currently a problem. 4. A1c was 6.2. 5. Recent LDL is 64. 6. The 3W's is endorsed and practiced to prevent Covid infection.   Medication Adjustments/Labs and Tests Ordered: Current medicines are reviewed at length with the patient today.  Concerns regarding medicines are outlined above.  Orders Placed This Encounter  Procedures  . Basic metabolic panel  . ECHOCARDIOGRAM COMPLETE   Meds ordered this encounter  Medications  . sacubitril-valsartan (ENTRESTO) 97-103 MG    Sig: Take 1 tablet by mouth 2 (two) times daily.    Dispense:  180 tablet    Refill:  3  . spironolactone  (ALDACTONE) 25 MG tablet    Sig: Take 1 tablet (25 mg total) by mouth 2 (two) times daily.    Dispense:  180 tablet    Refill:  3    Dose change    Patient Instructions  Medication Instructions:  1) INCREASE Spironolactone to 25mg  twice daily  *If you need a refill on your cardiac medications before your next appointment, please call your pharmacy*  Lab Work: Your physician recommends that you return for lab work in: 7-10 days (BMET)  If you have labs (blood work) drawn today and your tests are completely normal, you will receive your results only by: [name] MyChart Message (if you have MyChart) OR . A paper copy in the mail If you have any lab test that is abnormal or we need to change your treatment, we will call you to review the results.  Testing/Procedures: Your physician has requested that you have an echocardiogram in mid to late April. Echocardiography is a painless test that uses sound waves to create images of your heart. It provides your doctor with information about the size and shape of your heart and how well your heart's chambers and valves are working. This procedure takes approximately one hour. There are no restrictions for this procedure.   Follow-Up: At [facility], you and your health needs are our priority.  As part of our continuing mission to provide you with exceptional heart care, we have created designated Provider Care Teams.  These Care Teams include your primary Cardiologist (physician) and Advanced Practice Providers (APPs -  Physician Assistants and Nurse Practitioners) who all work together to provide you with the care you need, when you need it.  Your next appointment:   6 month(s)  The format for your next appointment:   In Person  Provider:   You may see [name] Grooms, MD or one of the following Advanced Practice Providers on your designated Care Team:  Truitt Merle, NP  Cecilie Kicks, NP  Kathyrn Drown, NP   Other Instructions       Signed, [name] Grooms, MD  05/27/2019 9:38 AM    [place]

## 2019-05-27 ENCOUNTER — Ambulatory Visit: Payer: Medicare Other | Admitting: Interventional Cardiology

## 2019-05-27 ENCOUNTER — Encounter: Payer: Self-pay | Admitting: Interventional Cardiology

## 2019-05-27 ENCOUNTER — Other Ambulatory Visit: Payer: Self-pay

## 2019-05-27 VITALS — BP 158/86 | HR 72 | Ht 70.0 in | Wt 291.8 lb

## 2019-05-27 DIAGNOSIS — I10 Essential (primary) hypertension: Secondary | ICD-10-CM

## 2019-05-27 DIAGNOSIS — E118 Type 2 diabetes mellitus with unspecified complications: Secondary | ICD-10-CM

## 2019-05-27 DIAGNOSIS — I110 Hypertensive heart disease with heart failure: Secondary | ICD-10-CM

## 2019-05-27 DIAGNOSIS — I5042 Chronic combined systolic (congestive) and diastolic (congestive) heart failure: Secondary | ICD-10-CM

## 2019-05-27 DIAGNOSIS — E1169 Type 2 diabetes mellitus with other specified complication: Secondary | ICD-10-CM | POA: Diagnosis not present

## 2019-05-27 DIAGNOSIS — I493 Ventricular premature depolarization: Secondary | ICD-10-CM

## 2019-05-27 DIAGNOSIS — E785 Hyperlipidemia, unspecified: Secondary | ICD-10-CM

## 2019-05-27 DIAGNOSIS — Z7189 Other specified counseling: Secondary | ICD-10-CM

## 2019-05-27 MED ORDER — SPIRONOLACTONE 25 MG PO TABS: 25.0000 mg | ORAL_TABLET | Freq: Two times a day (BID) | ORAL | 3 refills | Status: DC

## 2019-05-27 MED ORDER — SACUBITRIL-VALSARTAN 97-103 MG PO TABS: 1.0000 | ORAL_TABLET | Freq: Two times a day (BID) | ORAL | 3 refills | Status: DC

## 2019-05-27 MED FILL — spIRONOLACTONE 25 MG TABS: 25 | 90 days supply | Qty: 180 | Fill #0

## 2019-05-27 NOTE — Patient Instructions (Signed)
Medication Instructions:  1) INCREASE Spironolactone to 25mg  twice daily  *If you need a refill on your cardiac medications before your next appointment, please call your pharmacy*  Lab Work: Your physician recommends that you return for lab work in: 7-10 days (BMET)  If you have labs (blood work) drawn today and your tests are completely normal, you will receive your results only by: [name] MyChart Message (if you have MyChart) OR . A paper copy in the mail If you have any lab test that is abnormal or we need to change your treatment, we will call you to review the results.  Testing/Procedures: Your physician has requested that you have an echocardiogram in mid to late April. Echocardiography is a painless test that uses sound waves to create images of your heart. It provides your doctor with information about the size and shape of your heart and how well your heart's chambers and valves are working. This procedure takes approximately one hour. There are no restrictions for this procedure.   Follow-Up: At [facility], you and your health needs are our priority.  As part of our continuing mission to provide you with exceptional heart care, we have created designated Provider Care Teams.  These Care Teams include your primary Cardiologist (physician) and Advanced Practice Providers (APPs -  Physician Assistants and Nurse Practitioners) who all work together to provide you with the care you need, when you need it.  Your next appointment:   6 month(s)  The format for your next appointment:   In Person  Provider:   You may see Sinclair Grooms, MD or one of the following Advanced Practice Providers on your designated Care Team:    Truitt Merle, NP  Cecilie Kicks, NP  Kathyrn Drown, NP   Other Instructions

## 2019-06-07 ENCOUNTER — Other Ambulatory Visit: Payer: Medicare Other | Admitting: *Deleted

## 2019-06-07 ENCOUNTER — Other Ambulatory Visit: Payer: Self-pay

## 2019-06-07 DIAGNOSIS — I5042 Chronic combined systolic (congestive) and diastolic (congestive) heart failure: Secondary | ICD-10-CM | POA: Diagnosis not present

## 2019-06-07 LAB — BASIC METABOLIC PANEL
BUN/Creatinine Ratio: 13 (ref 10–24)
BUN: 14 mg/dL (ref 8–27)
CO2: 25 mmol/L (ref 20–29)
Calcium: 9.7 mg/dL (ref 8.6–10.2)
Chloride: 101 mmol/L (ref 96–106)
Creatinine, Ser: 1.04 mg/dL (ref 0.76–1.27)
GFR calc Af Amer: 86 mL/min/{1.73_m2} (ref >59)
GFR calc non Af Amer: 74 mL/min/{1.73_m2} (ref >59)
Glucose: 116 mg/dL — ABNORMAL HIGH (ref 65–99)
Potassium: 4.2 mmol/L (ref 3.5–5.2)
Sodium: 140 mmol/L (ref 134–144)

## 2019-06-09 ENCOUNTER — Telehealth: Payer: Self-pay | Admitting: *Deleted

## 2019-06-09 DIAGNOSIS — Z79899 Other long term (current) drug therapy: Secondary | ICD-10-CM

## 2019-06-09 NOTE — Telephone Encounter (Signed)
-----   Message from Belva Crome, MD sent at 06/07/2019  7:52 PM EST ----- Let the patient know the kideny function is stable. We need repeat BMET 2 months. Hopefully BP improving. A copy will be sent to Elby Showers, MD

## 2019-06-17 MED FILL — SPIRONOLACTONE 25 MG TABS: 25 | 90 days supply | Qty: 180 | Fill #0

## 2019-06-17 MED FILL — ENTRESTO 97 MG-103 MG TAB: 97-103 | 90 days supply | Qty: 180 | Fill #0

## 2019-06-18 ENCOUNTER — Telehealth: Payer: Self-pay | Admitting: Internal Medicine

## 2019-06-18 DIAGNOSIS — Z789 Other specified health status: Secondary | ICD-10-CM

## 2019-06-18 MED FILL — ALPRAZolam 0.5 MG TABS: 0.5 | 30 days supply | Qty: 60 | Fill #5

## 2019-06-18 NOTE — Telephone Encounter (Signed)
Called patient to let him know forms are ready for pick up.

## 2019-06-18 NOTE — Telephone Encounter (Signed)
Tanisha dropped off disability Parking Placard forms to be completed.

## 2019-06-29 MED FILL — PAZEO 0.7% EYE DROPS: 0.7 | 36 days supply | Qty: 3 | Fill #1

## 2019-07-23 ENCOUNTER — Other Ambulatory Visit: Payer: Self-pay | Admitting: Internal Medicine

## 2019-07-23 MED FILL — CARVEDILOL 25 MG TABLET: 25 | 90 days supply | Qty: 180 | Fill #1

## 2019-07-23 MED FILL — ZOLPIDEM TARTRATE 5 MG TAB: 5 | 90 days supply | Qty: 90 | Fill #1

## 2019-07-23 MED FILL — ALPRAZolam 0.5 MG TABS: 0.5 | 30 days supply | Qty: 60 | Fill #0

## 2019-08-02 ENCOUNTER — Telehealth: Payer: Self-pay

## 2019-08-02 NOTE — Telephone Encounter (Signed)
It's fine if they have that available for he and his wife. Would like for them to take it.

## 2019-08-02 NOTE — Telephone Encounter (Signed)
Called and spoke with patient to let him know what Dr Renold Genta said. He verbalized understanding.

## 2019-08-02 NOTE — Telephone Encounter (Signed)
Patient called wants to know if you think it's a good idea that him and his wife go to their hometown to get their COVID vaccine? He said he would like your advise.   Call back number [phone]

## 2019-08-10 ENCOUNTER — Other Ambulatory Visit: Payer: Medicare Other

## 2019-08-10 ENCOUNTER — Other Ambulatory Visit: Payer: Self-pay

## 2019-08-10 DIAGNOSIS — Z79899 Other long term (current) drug therapy: Secondary | ICD-10-CM | POA: Diagnosis not present

## 2019-08-10 LAB — BASIC METABOLIC PANEL
BUN/Creatinine Ratio: 12 (ref 10–24)
BUN: 13 mg/dL (ref 8–27)
CO2: 21 mmol/L (ref 20–29)
Calcium: 9.5 mg/dL (ref 8.6–10.2)
Chloride: 102 mmol/L (ref 96–106)
Creatinine, Ser: 1.05 mg/dL (ref 0.76–1.27)
GFR calc Af Amer: 85 mL/min/{1.73_m2} (ref >59)
GFR calc non Af Amer: 74 mL/min/{1.73_m2} (ref >59)
Glucose: 110 mg/dL — ABNORMAL HIGH (ref 65–99)
Potassium: 4.6 mmol/L (ref 3.5–5.2)
Sodium: 137 mmol/L (ref 134–144)

## 2019-08-12 ENCOUNTER — Telehealth: Payer: Self-pay | Admitting: Internal Medicine

## 2019-08-12 MED ORDER — OMEPRAZOLE 20 MG PO CPDR: 20.0000 mg | DELAYED_RELEASE_CAPSULE | Freq: Every day | ORAL | 3 refills | Status: DC

## 2019-08-12 MED FILL — OMEPRAZOLE DR 20 MG CAPSULE: 20 | 90 days supply | Qty: 90 | Fill #0

## 2019-08-12 NOTE — Telephone Encounter (Signed)
OK to change to Omeprazole 20 mg daily

## 2019-08-12 NOTE — Telephone Encounter (Signed)
Patient called back per insurance he can have omeprazole okay to change?

## 2019-08-12 NOTE — Telephone Encounter (Signed)
[name] [name] [phone]  [name] called to say that the pantoprazole (PROTONIX) 40 MG tablet  Is giving him heartburn at night and he needs to try one of the other ones that is cheaper than the one he was on before this one.

## 2019-08-12 NOTE — Telephone Encounter (Signed)
Done, patient aware

## 2019-08-20 ENCOUNTER — Other Ambulatory Visit: Payer: Self-pay | Admitting: Internal Medicine

## 2019-08-20 MED FILL — AMLODIPINE BESYLATE 10 MG T: 10 | 90 days supply | Qty: 90 | Fill #3

## 2019-08-20 MED FILL — MELOXICAM 15 MG TABLET: 15 | 90 days supply | Qty: 90 | Fill #2

## 2019-08-20 MED FILL — METFORMIN HCL ER 500 MG TB2: 500 | 90 days supply | Qty: 180 | Fill #1

## 2019-08-20 MED FILL — ATORVASTATIN 10 MG TABLET: 10 | 90 days supply | Qty: 90 | Fill #0

## 2019-08-20 MED FILL — FUROSEMIDE 40 MG TAB: 40 | 90 days supply | Qty: 90 | Fill #2

## 2019-08-20 MED FILL — ALPRAZolam 0.5 MG TABS: 0.5 | 30 days supply | Qty: 60 | Fill #1

## 2019-09-03 ENCOUNTER — Ambulatory Visit (INDEPENDENT_AMBULATORY_CARE_PROVIDER_SITE_OTHER): Payer: Medicare Other | Admitting: Internal Medicine

## 2019-09-03 ENCOUNTER — Ambulatory Visit
Admission: RE | Admit: 2019-09-03 | Discharge: 2019-09-03 | Disposition: A | Discharge status: Home / Self Care | Payer: Medicare Other | Source: Ambulatory Visit | Attending: Internal Medicine | Admitting: Internal Medicine

## 2019-09-03 ENCOUNTER — Encounter: Payer: Self-pay | Admitting: Internal Medicine

## 2019-09-03 ENCOUNTER — Telehealth: Payer: Self-pay | Admitting: Internal Medicine

## 2019-09-03 ENCOUNTER — Other Ambulatory Visit: Payer: Self-pay

## 2019-09-03 VITALS — BP 110/70 | HR 80 | Temp 98.0°F | Ht 70.0 in | Wt 291.0 lb

## 2019-09-03 DIAGNOSIS — R109 Unspecified abdominal pain: Secondary | ICD-10-CM | POA: Diagnosis not present

## 2019-09-03 DIAGNOSIS — K5900 Constipation, unspecified: Secondary | ICD-10-CM

## 2019-09-03 NOTE — Telephone Encounter (Signed)
He needs KUB first STAT then OV

## 2019-09-03 NOTE — Progress Notes (Signed)
   Subjective:    Patient ID: [name] [name], male    DOB: 12-Jan-1951, 69 y.o.   MRN: [id number]  HPI 69 year old male called with complaint of constipation over the past couple of days.  He has had some vague abdominal pain.  Only passing some small balls of hard stool.  This is unusual for him.  Had him go get KUB flat and upright abdominal film before visit.  There was a normal bowel gas pattern.  He has had a previous cholecystectomy.  Small stone noted in the lower pole of the right kidney unchanged from prior exam in 2019.  There was no significant stool in the colon.  No fecal impaction was noted.    Review of Systems no fever chills nausea or vomiting.  Just uncomfortable     Objective:   Physical Exam Abdomen is obese soft nondistended without hepatosplenomegaly masses or significant tenderness.  Rectal exam there is no fecal impaction       Assessment & Plan:  Obstipation/constipation  Plan: Recommend 2 Dulcolax tablets now and repeat in 12 hours.  Call if not getting better.

## 2019-09-03 NOTE — Telephone Encounter (Signed)
Berkay Pignone [phone]  [name] called to say he is constipated for last 3 days, he went just a little yesterday. He is having a lot pain and cramping in his stomach He also took some clear lax this morning around 3 am.

## 2019-09-05 NOTE — Patient Instructions (Signed)
Take 2 Dulcolax tablets now and repeat in 12 hours.  May need MiraLAX to keep from having constipation/obstipation.  Call if not getting better in 24 to 48 hours or sooner if worse.

## 2019-09-07 ENCOUNTER — Encounter: Payer: Self-pay | Admitting: Internal Medicine

## 2019-09-07 MED FILL — ENTRESTO 97 MG-103 MG TAB: 97-103 | 90 days supply | Qty: 180 | Fill #1

## 2019-09-16 MED FILL — ALPRAZolam 0.5 MG TABS: 0.5 | 30 days supply | Qty: 60 | Fill #2

## 2019-09-16 MED FILL — SPIRONOLACTONE 25 MG TABS: 25 | 90 days supply | Qty: 180 | Fill #1

## 2019-10-18 ENCOUNTER — Other Ambulatory Visit: Payer: Self-pay | Admitting: Internal Medicine

## 2019-10-18 ENCOUNTER — Other Ambulatory Visit: Payer: Self-pay | Admitting: Interventional Cardiology

## 2019-10-18 MED FILL — ALPRAZolam 0.5 MG TABS: 0.5 | 30 days supply | Qty: 60 | Fill #3

## 2019-10-19 MED FILL — ZOLPIDEM TARTRATE 5 MG TAB: 5 | 90 days supply | Qty: 90 | Fill #0

## 2019-10-20 ENCOUNTER — Other Ambulatory Visit: Payer: Self-pay | Admitting: Interventional Cardiology

## 2019-10-20 MED FILL — CARVEDILOL 25 MG TABLET: 25 | 90 days supply | Qty: 180 | Fill #0

## 2019-10-22 ENCOUNTER — Other Ambulatory Visit: Payer: Self-pay

## 2019-10-22 ENCOUNTER — Other Ambulatory Visit: Payer: Medicare Other | Admitting: Internal Medicine

## 2019-10-22 DIAGNOSIS — E119 Type 2 diabetes mellitus without complications: Secondary | ICD-10-CM

## 2019-10-22 DIAGNOSIS — E1169 Type 2 diabetes mellitus with other specified complication: Secondary | ICD-10-CM

## 2019-10-22 DIAGNOSIS — E785 Hyperlipidemia, unspecified: Secondary | ICD-10-CM | POA: Diagnosis not present

## 2019-10-23 LAB — HEPATIC FUNCTION PANEL
AG Ratio: 1.4 (calc) (ref 1.0–2.5)
ALT: 10 U/L (ref 9–46)
AST: 13 U/L (ref 10–35)
Albumin: 4.3 g/dL (ref 3.6–5.1)
Alkaline phosphatase (APISO): 63 U/L (ref 35–144)
Bilirubin, Direct: 0.3 mg/dL — ABNORMAL HIGH (ref 0.0–0.2)
Globulin: 3.1 g/dL (calc) (ref 1.9–3.7)
Indirect Bilirubin: 1 mg/dL (calc) (ref 0.2–1.2)
Total Bilirubin: 1.3 mg/dL — ABNORMAL HIGH (ref 0.2–1.2)
Total Protein: 7.4 g/dL (ref 6.1–8.1)

## 2019-10-23 LAB — HEMOGLOBIN A1C
Hgb A1c MFr Bld: 6.4 % of total Hgb — ABNORMAL HIGH (ref <5.7)
Mean Plasma Glucose: 137 (calc)
eAG (mmol/L): 7.6 (calc)

## 2019-10-23 LAB — LIPID PANEL
Cholesterol: 122 mg/dL (ref <200)
HDL: 48 mg/dL (ref >=40)
LDL Cholesterol (Calc): 59 mg/dL (calc)
Non-HDL Cholesterol (Calc): 74 mg/dL (calc) (ref <130)
Total CHOL/HDL Ratio: 2.5 (calc) (ref <5.0)
Triglycerides: 74 mg/dL (ref <150)

## 2019-10-25 ENCOUNTER — Ambulatory Visit (INDEPENDENT_AMBULATORY_CARE_PROVIDER_SITE_OTHER): Payer: Medicare Other | Admitting: Internal Medicine

## 2019-10-25 ENCOUNTER — Encounter: Payer: Self-pay | Admitting: Internal Medicine

## 2019-10-25 ENCOUNTER — Ambulatory Visit (HOSPITAL_COMMUNITY): Payer: Medicare Other | Attending: Cardiovascular Disease

## 2019-10-25 ENCOUNTER — Other Ambulatory Visit: Payer: Self-pay

## 2019-10-25 VITALS — BP 150/90 | HR 79 | Temp 98.1°F | Ht 70.0 in | Wt 276.0 lb

## 2019-10-25 DIAGNOSIS — I10 Essential (primary) hypertension: Secondary | ICD-10-CM | POA: Diagnosis not present

## 2019-10-25 DIAGNOSIS — E7800 Pure hypercholesterolemia, unspecified: Secondary | ICD-10-CM | POA: Diagnosis not present

## 2019-10-25 DIAGNOSIS — E1169 Type 2 diabetes mellitus with other specified complication: Secondary | ICD-10-CM | POA: Diagnosis not present

## 2019-10-25 DIAGNOSIS — I5042 Chronic combined systolic (congestive) and diastolic (congestive) heart failure: Secondary | ICD-10-CM

## 2019-10-25 DIAGNOSIS — G4709 Other insomnia: Secondary | ICD-10-CM | POA: Diagnosis not present

## 2019-10-25 DIAGNOSIS — F411 Generalized anxiety disorder: Secondary | ICD-10-CM

## 2019-10-25 DIAGNOSIS — E785 Hyperlipidemia, unspecified: Secondary | ICD-10-CM

## 2019-10-25 DIAGNOSIS — I700 Atherosclerosis of aorta: Secondary | ICD-10-CM

## 2019-10-25 DIAGNOSIS — Z6839 Body mass index (BMI) 39.0-39.9, adult: Secondary | ICD-10-CM

## 2019-10-25 LAB — ECHOCARDIOGRAM COMPLETE
Height: 70 in
Weight: 4416 oz

## 2019-10-25 NOTE — Patient Instructions (Addendum)
Please continue to work on diet exercise and weight loss.  No change in medication.  Follow-up here in 6 months for health maintenance exam and Medicare wellness exam.  He will be having colonoscopy at hospital in the near future and he also has follow-up appointment today for 2D echocardiogram at Cardiology.

## 2019-10-25 NOTE — Progress Notes (Signed)
   Subjective:    Patient ID: [name] [name], male    DOB: 11-09-1950, 69 y.o.   MRN: [id number]  HPI 69 year old Male for 6 month recheck.  He had welcome to Medicare physical exam in October 2020.  He has a history of diabetes mellitus, hypertension, obesity, gout, GE reflux and erectile dysfunction.  He is followed by Dr. Pernell Dupre for left ventricular heart failure with ejection fraction noted to be less than 35%.  Nonobstructive minimal atherosclerosis in the coronary arteries and abdominal atherosclerosis.  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 Says BP good at home. Has appt with Cardiology today for echocardiogram.  Says colonoscopy due soon and will be done in hospital due to ejection fraction of 35 %.     Objective:   Physical Exam  BP 150/90 repeated 150/90.  Pulse 79 regular temperature 98.1 degrees pulse oximetry 99% weight 276 pounds BMI 39.60 Neck is supple.  No carotid bruits.  Chest clear to auscultation.  Cardiac exam regular rate and rhythm normal S1 and S2 without murmurs or gallops.  No lower extremity pitting edema.     Assessment & Plan:  Diabetes mellitus-hemoglobin A1c 6.4%.  Previously was 6.2% in October 2020.  Continue diet and exercise regimen.  Remains on Metformin twice daily.  GE reflux treated with Prilosec  History of congestive heart failure treated with Entresto and spironolactone  Insomnia treated with Ambien  Anxiety treated with Xanax twice daily as needed  Hypertension treated with amlodipine 10 mg daily, carvedilol 25 mg twice daily and Lasix 40 mg daily.  History of musculoskeletal pain treated with Mobic  He has mild elevation of total bilirubin at 1.3 consistent with Gilbert's with direct bilirubin 0.3 and very mildly elevated.  Alkaline phosphatase, SGOT and SGPT are normal.  History of elevated LDL  cholesterol-now normal on statin medication.  Plan: Echocardiogram today has been read and apparently ejection fraction is considerably improved and found to be within normal limits which is excellent.  He is to continue with diet and exercise regimen and follow-up here in 6 months.

## 2019-10-28 ENCOUNTER — Other Ambulatory Visit (HOSPITAL_COMMUNITY): Payer: Medicare Other

## 2019-11-02 ENCOUNTER — Encounter: Payer: Medicare Other | Admitting: Internal Medicine

## 2019-11-03 MED FILL — PANTOPRAZOLE SOD DR 40 MG T: 40 | 90 days supply | Qty: 90 | Fill #1

## 2019-11-15 ENCOUNTER — Other Ambulatory Visit: Payer: Self-pay | Admitting: Internal Medicine

## 2019-11-23 ENCOUNTER — Encounter: Payer: Self-pay | Admitting: Internal Medicine

## 2019-11-23 ENCOUNTER — Telehealth: Payer: Self-pay | Admitting: Internal Medicine

## 2019-11-23 ENCOUNTER — Ambulatory Visit (INDEPENDENT_AMBULATORY_CARE_PROVIDER_SITE_OTHER): Payer: Medicare Other | Admitting: Internal Medicine

## 2019-11-23 VITALS — BP 128/79 | HR 90 | Ht 71.0 in | Wt 290.4 lb

## 2019-11-23 DIAGNOSIS — Z1211 Encounter for screening for malignant neoplasm of colon: Secondary | ICD-10-CM

## 2019-11-23 NOTE — Progress Notes (Signed)
   [name] [name] 69 y.o. 1950/09/23 [id number]  Assessment & Plan:   Encounter Diagnosis  Name Primary?  . Colon cancer screening Yes    Screening colonoscopy will be scheduled in [place] - EF NL no other contraindications to being done in [place]   No charge as this could have been RN visit only  I appreciate the opportunity to care for him. [name] Mayer, MD, [name]     Subjective:   Chief Complaint: colon cancer screening  HPI Was to have screening colonoscopy last year - EF < 35, now NL after medical Tx from cardiology. Colonoscopy not done then - Covid cancellations, etc. No GI sxs  No Known Allergies Current Meds  Medication Sig  . ALPRAZolam (XANAX) 0.5 MG tablet TAKE 1 TABLET BY MOUTH TWICE A DAY AS NEEDED FOR ANXIETY  . amLODipine (NORVASC) 10 MG tablet TAKE 1 TABLET BY MOUTH DAILY.  [name] [name] aspirin EC 81 MG tablet Take 1 tablet (81 mg total) by mouth daily.  [name] [name] atorvastatin (LIPITOR) 10 MG tablet TAKE 1 TABLET BY MOUTH DAILY.  . carvedilol (COREG) 25 MG tablet TAKE 1 TABLET BY MOUTH 2 TIMES DAILY.  . furosemide (LASIX) 40 MG tablet TAKE 1 TABLET BY MOUTH DAILY  . meloxicam (MOBIC) 15 MG tablet Take 1 tablet (15 mg total) by mouth daily.  . metFORMIN (GLUCOPHAGE-XR) 500 MG 24 hr tablet TAKE 1 TABLET BY MOUTH TWICE A DAY  . Olopatadine HCl 0.7 % SOLN Apply 1 drop to eye daily.  [name] [name] omeprazole (PRILOSEC) 20 MG capsule Take 1 capsule (20 mg total) by mouth daily.  . sacubitril-valsartan (ENTRESTO) 97-103 MG Take 1 tablet by mouth 2 (two) times daily.  [name] [name] spironolactone (ALDACTONE) 25 MG tablet Take 1 tablet (25 mg total) by mouth 2 (two) times daily.  [name] [name] zolpidem (AMBIEN) 5 MG tablet TAKE 1 TABLET (5 MG TOTAL) BY MOUTH AT BEDTIME AS NEEDED FOR SLEEP.   Past Medical History:  Diagnosis Date  . Arthritis   . Diabetes mellitus without complication ([place])   . DJD (degenerative joint disease) of knee    Left  . Hypertension    Past Surgical History:  Procedure Laterality  Date  . CARDIAC CATHETERIZATION  07/23/2018  . CHOLECYSTECTOMY N/A 02/04/2016   Procedure: LAPAROSCOPIC CHOLECYSTECTOMY;  Surgeon: Leighton Ruff, MD;  [facility]: [facility];  Service: General;  Laterality: N/A;  . ERCP N/A 02/02/2016   Procedure: ENDOSCOPIC RETROGRADE CHOLANGIOPANCREATOGRAPHY (ERCP);  Surgeon: [name] Mayer, MD;  Location: [facility];  Service: Endoscopy;  Laterality: N/A;  . HERNIA REPAIR    . RIGHT/LEFT HEART CATH AND CORONARY ANGIOGRAPHY N/A 07/23/2018   Procedure: RIGHT/LEFT HEART CATH AND CORONARY ANGIOGRAPHY;  Surgeon: Belva Crome, MD;  Location: [facility];  Service: Cardiovascular;  Laterality: N/A;  . SPINE SURGERY    . TOTAL KNEE ARTHROPLASTY Left 07/28/2018   Procedure: TOTAL KNEE ARTHROPLASTY;  Surgeon: Melrose Nakayama, MD;  Location: [facility];  Service: Orthopedics;  Laterality: Left;   Social History   Social History Narrative   Retired from [employer]   family history includes Hypertension in his mother; Stroke in his mother.   Review of Systems As above  Objective:   Physical Exam BP 128/79   Pulse 90   Ht 5\' 11"  (1.803 m)   Wt 290 lb 6 oz (131.7 kg)   BMI 40.50 kg/m  Lungs cta Cor NL

## 2019-11-23 NOTE — Telephone Encounter (Signed)
Chart corrected to show the right dates.

## 2019-11-23 NOTE — Telephone Encounter (Signed)
Pt called to inform that he provided incorrect date of his Covids shots. The correct dates are: 08/20/19 first vaccine dose, and 09/14/19 second dose. He had Pfizer vaccine. Pt apologized for giving incorrect dates. He realized when he got home and looked at his card.

## 2019-11-23 NOTE — Patient Instructions (Addendum)
You have been scheduled for a colonoscopy. Please follow written instructions given to you at your visit today.  Please pick up your prep supplies at the pharmacy within the next 1-3 days. If you use inhalers (even only as needed), please bring them with you on the day of your procedure.   I appreciate the opportunity to care for you. Carl Gessner, MD, FACG 

## 2019-12-07 DIAGNOSIS — Z860101 Personal history of adenomatous and serrated colon polyps: Secondary | ICD-10-CM

## 2019-12-07 DIAGNOSIS — Z86010 Personal history of colonic polyps: Secondary | ICD-10-CM

## 2019-12-07 HISTORY — DX: Personal history of colonic polyps: Z86.010

## 2019-12-07 HISTORY — DX: Personal history of adenomatous and serrated colon polyps: Z86.0101

## 2019-12-13 ENCOUNTER — Ambulatory Visit (AMBULATORY_SURGERY_CENTER): Payer: Medicare Other | Admitting: Internal Medicine

## 2019-12-13 ENCOUNTER — Other Ambulatory Visit: Payer: Self-pay

## 2019-12-13 ENCOUNTER — Encounter: Payer: Self-pay | Admitting: Internal Medicine

## 2019-12-13 VITALS — BP 114/70 | HR 61 | Temp 96.9°F | Resp 18 | Ht 69.0 in | Wt 290.0 lb

## 2019-12-13 DIAGNOSIS — D123 Benign neoplasm of transverse colon: Secondary | ICD-10-CM | POA: Diagnosis not present

## 2019-12-13 DIAGNOSIS — K633 Ulcer of intestine: Secondary | ICD-10-CM | POA: Diagnosis not present

## 2019-12-13 DIAGNOSIS — K6389 Other specified diseases of intestine: Secondary | ICD-10-CM | POA: Diagnosis not present

## 2019-12-13 DIAGNOSIS — Z1211 Encounter for screening for malignant neoplasm of colon: Secondary | ICD-10-CM | POA: Diagnosis not present

## 2019-12-13 DIAGNOSIS — D125 Benign neoplasm of sigmoid colon: Secondary | ICD-10-CM

## 2019-12-13 HISTORY — PX: COLONOSCOPY: SHX174

## 2019-12-13 MED ORDER — SODIUM CHLORIDE 0.9 % IV SOLN: 500.0000 mL | Freq: Once | INTRAVENOUS | Status: DC

## 2019-12-13 NOTE — Progress Notes (Signed)
Pt's states no medical or surgical changes since previsit or office visit. 

## 2019-12-13 NOTE — Progress Notes (Signed)
Called to room to assist during endoscopic procedure.  Patient ID and intended procedure confirmed with present staff. Received instructions for my participation in the procedure from the performing physician.  

## 2019-12-13 NOTE — Op Note (Signed)
[place] Patient Name: [name] [name] Procedure Date: 12/13/2019 11:29 AM MRN: [id number] Endoscopist: [name] , MD Age: 69 Referring MD:  Date of Birth: [birth year] Gender: Male Account #: [id number] Procedure:                Colonoscopy Indications:              Screening for colorectal malignant neoplasm, Last                            colonoscopy: 2009 Medicines:                Propofol per Anesthesia, Monitored Anesthesia Care Procedure:                Pre-Anesthesia Assessment:                           - Prior to the procedure, a History and Physical                            was performed, and patient medications and                            allergies were reviewed. The patient's tolerance of                            previous anesthesia was also reviewed. The risks                            and benefits of the procedure and the sedation                            options and risks were discussed with the patient.                            All questions were answered, and informed consent                            was obtained. Prior Anticoagulants: The patient has                            taken no previous anticoagulant or antiplatelet                            agents. ASA Grade Assessment: III - A patient with                            severe systemic disease. After reviewing the risks                            and benefits, the patient was deemed in                            satisfactory condition to undergo the procedure.  After obtaining informed consent, the colonoscope                            was passed under direct vision. Throughout the                            procedure, the patient's blood pressure, pulse, and                            oxygen saturations were monitored continuously. The                            Colonoscope was introduced through the anus and                            advanced to the  the terminal ileum, with                            identification of the appendiceal orifice and IC                            valve. The colonoscopy was somewhat difficult due                            to significant looping. Successful completion of                            the procedure was aided by applying abdominal                            pressure. The patient tolerated the procedure well.                            The quality of the bowel preparation was excellent.                            The ileocecal valve, appendiceal orifice, and                            rectum were photographed. The bowel preparation                            used was Miralax via split dose instruction. Scope In: 11:38:03 AM Scope Out: 12:01:30 PM Scope Withdrawal Time: 0 hours 20 minutes 9 seconds  Total Procedure Duration: 0 hours 23 minutes 27 seconds  Findings:                 The perianal and digital rectal examinations were                            normal. Pertinent negatives include normal prostate                            (size, shape, and consistency).  A 4 mm polyp was found in the sigmoid colon. The                            polyp was sessile. The polyp was removed with a                            cold snare. Resection and retrieval were complete.                            Verification of patient identification for the                            specimen was done. Estimated blood loss was minimal.                           A 2 mm polyp was found in the distal transverse                            colon. The polyp was sessile. The polyp was removed                            with a cold biopsy forceps. Resection and retrieval                            were complete. Verification of patient                            identification for the specimen was done. Estimated                            blood loss was minimal.                           A single  (solitary) ulcer was found at the                            ileocecal valve. Biopsies were taken with a cold                            forceps for histology. Verification of patient                            identification for the specimen was done. Estimated                            blood loss was minimal.                           The terminal ileum appeared normal.                           Multiple diverticula were found in the sigmoid  colon.                           The exam was otherwise without abnormality on                            direct and retroflexion views. Complications:            No immediate complications. Estimated Blood Loss:     Estimated blood loss was minimal. Impression:               - One 4 mm polyp in the sigmoid colon, removed with                            a cold snare. Resected and retrieved.                           - One 2 mm polyp in the distal transverse colon,                            removed with a cold biopsy forceps. Resected and                            retrieved.                           - A single (solitary) ulcer at the ileocecal valve.                            Biopsied. Small with surrounding erythema. ? from                            prep vs meloxicam                           - The examined portion of the ileum was normal.                           - Diverticulosis in the sigmoid colon.                           - The examination was otherwise normal on direct                            and retroflexion views. Recommendation:           - Patient has a contact number available for                            emergencies. The signs and symptoms of potential                            delayed complications were discussed with the                            patient. Return to normal activities tomorrow.  Written discharge instructions were provided to the                             patient.                           - Resume previous diet.                           - Continue present medications.                           - Repeat colonoscopy is recommended. The                            colonoscopy date will be determined after pathology                            results from today's exam become available for                            review. [name], MD 12/13/2019 12:15:15 PM This report has been signed electronically.

## 2019-12-13 NOTE — Progress Notes (Signed)
A/ox3, pleased with MAC, report to RN 

## 2019-12-13 NOTE — Patient Instructions (Addendum)
I found and removed 2 tiny polyps.  They look benign but could be pre-cancerous.  There was a small ulcer also - I took biopsies. It could be from the prep or from meloxicam. I do not think it is a problem but took biopsies to be sure.  I will let you know pathology results and when to have another routine colonoscopy by mail and/or My Chart.  I appreciate the opportunity to care for you. [name], MD, [facility]   Handout on polyps & diverticulosis given to you today   Await biopsy results & pathology results on polyps removed    YOU HAD AN ENDOSCOPIC PROCEDURE TODAY AT [place]:   Refer to the procedure report that was given to you for any specific questions about what was found during the examination.  If the procedure report does not answer your questions, please call your gastroenterologist to clarify.  If you requested that your care partner not be given the details of your procedure findings, then the procedure report has been included in a sealed envelope for you to review at your convenience later.  YOU SHOULD EXPECT: Some feelings of bloating in the abdomen. Passage of more gas than usual.  Walking can help get rid of the air that was put into your GI tract during the procedure and reduce the bloating. If you had a lower endoscopy (such as a colonoscopy or flexible sigmoidoscopy) you may notice spotting of blood in your stool or on the toilet paper. If you underwent a bowel prep for your procedure, you may not have a normal bowel movement for a few days.  Please Note:  You might notice some irritation and congestion in your nose or some drainage.  This is from the oxygen used during your procedure.  There is no need for concern and it should clear up in a day or so.  SYMPTOMS TO REPORT IMMEDIATELY:   Following lower endoscopy (colonoscopy or flexible sigmoidoscopy):  Excessive amounts of blood in the stool  Significant tenderness or worsening of abdominal  pains  Swelling of the abdomen that is new, acute  Fever of 100F or higher    For urgent or emergent issues, a gastroenterologist can be reached at any hour by calling [phone]. Do not use MyChart messaging for urgent concerns.    DIET:  We do recommend a small meal at first, but then you may proceed to your regular diet.  Drink plenty of fluids but you should avoid alcoholic beverages for 24 hours.  ACTIVITY:  You should plan to take it easy for the rest of today and you should NOT DRIVE or use heavy machinery until tomorrow (because of the sedation medicines used during the test).    FOLLOW UP: Our staff will call the number listed on your records 48-72 hours following your procedure to check on you and address any questions or concerns that you may have regarding the information given to you following your procedure. If we do not reach you, we will leave a message.  We will attempt to reach you two times.  During this call, we will ask if you have developed any symptoms of COVID 19. If you develop any symptoms (ie: fever, flu-like symptoms, shortness of breath, cough etc.) before then, please call [phone].  If you test positive for Covid 19 in the 2 weeks post procedure, please call and report this information to [place].    If any biopsies were taken you  will be contacted by phone or by letter within the next 1-3 weeks.  Please call us at [phone] if you have not heard about the biopsies in 3 weeks.    SIGNATURES/CONFIDENTIALITY: You and/or your care partner have signed paperwork which will be entered into your electronic medical record.  These signatures attest to the fact that that the information above on your After Visit Summary has been reviewed and is understood.  Full responsibility of the confidentiality of this discharge information lies with you and/or your care-partner.

## 2019-12-15 ENCOUNTER — Telehealth: Payer: Self-pay

## 2019-12-15 NOTE — Telephone Encounter (Signed)
°  Follow up Call-  Call back number 12/13/2019  Post procedure Call Back phone  # [phone]  Permission to leave phone message Yes  Some recent data might be hidden     Patient questions:  Do you have a fever, pain , or abdominal swelling? No. Pain Score  0 *  Have you tolerated food without any problems? Yes.    Have you been able to return to your normal activities? Yes.    Do you have any questions about your discharge instructions: Diet   No. Medications  No. Follow up visit  No.  Do you have questions or concerns about your Care? No.  Actions: * If pain score is 4 or above: No action needed, pain <4. 1. Have you developed a fever since your procedure? no  2.   Have you had an respiratory symptoms (SOB or cough) since your procedure? no  3.   Have you tested positive for COVID 19 since your procedure no  4.   Have you had any family members/close contacts diagnosed with the COVID 19 since your procedure?  no   If yes to any of these questions please route to Joylene John, RN and Erenest Rasher, RN

## 2019-12-20 ENCOUNTER — Encounter: Payer: Self-pay | Admitting: Internal Medicine

## 2019-12-22 ENCOUNTER — Other Ambulatory Visit: Payer: Self-pay | Admitting: *Deleted

## 2019-12-22 NOTE — Patient Outreach (Signed)
[facility]) Care Management  12/22/2019  COCHISE DINNEEN [birth year] [id number]   Telephone Screen  Referral Date:  12/15/2019 Referral Source:  EMMI Prevent Reason for Referral:  EMMI Prevent Screening/Join Care Management Insurance:  [employer] Medicare   Outreach Attempt:  Successful telephone outreach to patient for telephone screening.  HIPAA verified with patient.  RN Health Coach introduced self and role.  [facility] services reviewed and discussed. Patient acknowledging his history of diabetes and heart failure but feels everything is managed well at this time.  Declines [facility] services.  Does agree to receive [facility] brochure in the mail.  Encouraged patient to contact [facility] in the future if needs arise.  Plan:  RN Health Coach will send patient [place] with Successful Outreach letter.  RN Health Coach will close case based on patient declining services at this time.   [place] [phone] [email or web address]

## 2019-12-27 LAB — HM DIABETES EYE EXAM

## 2019-12-28 ENCOUNTER — Encounter: Payer: Self-pay | Admitting: Internal Medicine

## 2020-01-04 NOTE — Progress Notes (Signed)
Cardiology Office Note:    Date:  01/05/2020   ID:  [name] [name], DOB [birth year], MRN [id number]  PCP:  Elby Showers, MD  Cardiologist:  Sinclair Grooms, MD   Referring MD: Elby Showers, MD   Chief Complaint  Patient presents with  . Congestive Heart Failure    History of Present Illness:    [name] [name] is a 69 y.o. male with a hx of type 2 diabetes mellitus, hypertension, abdominal atherosclerosis, nonobstructive/minimal atherosclerosis in coronary arteries, severe obesity, and recently discovered systolic left ventricular heart failure with EF less than or equal to 35%.  [name] [name] is doing well.  He denies angina.  He denies dyspnea.  He has not had syncope and is having no medication side effects.  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  Past Medical History:  Diagnosis Date  . Arthritis   . Diabetes mellitus without complication ([place])   . DJD (degenerative joint disease) of knee    Left  . Hx of adenomatous colonic polyps 12/2019   2 diminutive  . Hypertension     Past Surgical History:  Procedure Laterality Date  . CARDIAC CATHETERIZATION  07/23/2018  . CHOLECYSTECTOMY N/A 02/04/2016   Procedure: LAPAROSCOPIC CHOLECYSTECTOMY;  Surgeon: Leighton Ruff, MD;  Location: [facility];  Service: General;  Laterality: N/A;  . COLONOSCOPY  12/13/2019  . ERCP N/A 02/02/2016   Procedure: ENDOSCOPIC RETROGRADE CHOLANGIOPANCREATOGRAPHY (ERCP);  Surgeon: [name], MD;  Location: [facility];  Service: Endoscopy;  Laterality: N/A;  . HERNIA REPAIR    . RIGHT/LEFT HEART CATH AND CORONARY ANGIOGRAPHY N/A 07/23/2018   Procedure: RIGHT/LEFT HEART CATH AND CORONARY ANGIOGRAPHY;  Surgeon: Belva Crome, MD;  Location: [facility] CV [facility];  Service: Cardiovascular;  Laterality: N/A;  . SPINE SURGERY    . TOTAL KNEE ARTHROPLASTY Left 07/28/2018   Procedure: TOTAL KNEE ARTHROPLASTY;   Surgeon: Melrose Nakayama, MD;  Location: [facility];  Service: Orthopedics;  Laterality: Left;    Current Medications: Current Meds  Medication Sig  . ALPRAZolam (XANAX) 0.5 MG tablet TAKE 1 TABLET BY MOUTH TWICE A DAY AS NEEDED FOR ANXIETY  . amLODipine (NORVASC) 10 MG tablet TAKE 1 TABLET BY MOUTH DAILY.  [name] aspirin EC 81 MG tablet Take 1 tablet (81 mg total) by mouth daily.  [name] atorvastatin (LIPITOR) 10 MG tablet TAKE 1 TABLET BY MOUTH DAILY.  . carvedilol (COREG) 25 MG tablet TAKE 1 TABLET BY MOUTH 2 TIMES DAILY.  . furosemide (LASIX) 40 MG tablet TAKE 1 TABLET BY MOUTH DAILY  . meloxicam (MOBIC) 15 MG tablet Take 1 tablet (15 mg total) by mouth daily.  . metFORMIN (GLUCOPHAGE-XR) 500 MG 24 hr tablet TAKE 1 TABLET BY MOUTH TWICE A DAY  . Olopatadine HCl 0.7 % SOLN Apply 1 drop to eye daily.  [name] omeprazole (PRILOSEC) 20 MG capsule Take 1 capsule (20 mg total) by mouth daily.  . sacubitril-valsartan (ENTRESTO) 97-103 MG Take 1 tablet by mouth 2 (two) times daily.  [name] spironolactone (ALDACTONE) 25 MG tablet Take 1 tablet (25 mg total) by mouth 2 (two) times daily.  [name] zolpidem (AMBIEN) 5 MG tablet TAKE 1 TABLET (5 MG TOTAL) BY MOUTH AT BEDTIME AS NEEDED FOR SLEEP.     Allergies:   Patient has no known allergies.   Social History   Socioeconomic History  . Marital status: Married    Spouse name:  Not on file  . Number of children: Not on file  . Years of education: Not on file  . Highest education level: Not on file  Occupational History  . Not on file  Tobacco Use  . Smoking status: Never Smoker  . Smokeless tobacco: Never Used  Vaping Use  . Vaping Use: Never used  Substance and Sexual Activity  . Alcohol use: No  . Drug use: No  . Sexual activity: Yes  Other Topics Concern  . Not on file  Social History Narrative   Retired from [employer] Strain:   . Difficulty of Paying Living Expenses:   Food Insecurity:   .  Worried About Charity fundraiser in the Last Year:   . Arboriculturist in the Last Year:   Transportation Needs:   . Film/video editor (Medical):   [name] Lack of Transportation (Non-Medical):   Physical Activity:   . Days of Exercise per Week:   . Minutes of Exercise per Session:   Stress:   . Feeling of Stress :   Social Connections:   . Frequency of Communication with Friends and Family:   . Frequency of Social Gatherings with Friends and Family:   . Attends Religious Services:   . Active Member of Clubs or Organizations:   . Attends Archivist Meetings:   [name] Marital Status:      Family History: The patient's family history includes Hypertension in his mother; Stroke in his mother. There is no history of Colon cancer, Stomach cancer, Rectal cancer, or Pancreatic cancer.  ROS:   Please see the history of present illness.    He denies joint discomfort.  No difficulty sleeping.  All other systems reviewed and are negative.  EKGs/Labs/Other Studies Reviewed:    The following studies were reviewed today: 2 D Doppler ECHOCARDIOGRAM 10/2019: IMPRESSIONS    1. Left ventricular ejection fraction, by estimation, is 55 to 60%. The  left ventricle has normal function. The left ventricle has no regional  wall motion abnormalities. There is mild left ventricular hypertrophy.  Left ventricular diastolic parameters  were normal.  2. Right ventricular systolic function is normal. The right ventricular  size is normal. There is mildly elevated pulmonary artery systolic  pressure.  3. Left atrial size was moderately dilated.  4. The mitral valve is normal in structure. Trivial mitral valve  regurgitation. No evidence of mitral stenosis.  5. The aortic valve is tricuspid. Aortic valve regurgitation is not  visualized. Mild to moderate aortic valve sclerosis/calcification is  present, without any evidence of aortic stenosis.  6. The inferior vena cava is normal in size  with greater than 50%  respiratory variability, suggesting right atrial pressure of 3 mmHg.   EKG:  EKG normal sinus rhythm, PR interval 212 ms, nonspecific T wave flattening.  Otherwise normal.  When compared to the prior tracing.  Since the prior tracing October 2020, generalized T wave inversion has resolved.  Recent Labs: 04/19/2019: Hemoglobin 14.3; Platelets 250 08/10/2019: BUN 13; Creatinine, Ser 1.05; Potassium 4.6; Sodium 137 10/22/2019: ALT 10  Recent Lipid Panel    Component Value Date/Time   CHOL 122 10/22/2019 0904   TRIG 74 10/22/2019 0904   HDL 48 10/22/2019 0904   CHOLHDL 2.5 10/22/2019 0904   VLDL 14 10/18/2016 1048   LDLCALC 59 10/22/2019 0904    Physical Exam:    VS:  BP 122/80  Pulse 71   Ht 5\' 9"  (1.194 m)   Wt 287 lb (130.2 kg)   SpO2 96%   BMI 42.38 kg/m     Wt Readings from Last 3 Encounters:  01/05/20 287 lb (130.2 kg)  12/13/19 290 lb (131.5 kg)  11/23/19 290 lb 6 oz (131.7 kg)     GEN: Moderate obesity. No acute distress HEENT: Normal NECK: No JVD. LYMPHATICS: No lymphadenopathy CARDIAC:  RRR without murmur, gallop, or edema. VASCULAR:  Normal Pulses. No bruits. RESPIRATORY:  Clear to auscultation without rales, wheezing or rhonchi  ABDOMEN: Soft, non-tender, non-distended, No pulsatile mass, MUSCULOSKELETAL: No deformity  SKIN: Warm and dry NEUROLOGIC:  Alert and oriented x 3 PSYCHIATRIC:  Normal affect   ASSESSMENT:    1. Chronic combined systolic and diastolic heart failure ([place])   2. Essential hypertension   3. PVC's (premature ventricular contractions)   4. Type 2 diabetes mellitus with complication, without long-term current use of insulin ([place])   5. Hyperlipidemia associated with type 2 diabetes mellitus ([place])   6. Educated about COVID-19 virus infection    PLAN:    In order of problems listed above:  1. Stable with normalized LVEF on heart failure therapy.  Continue Entresto, Aldactone, carvedilol. 2. Excellent control.   Continue above medications as well as furosemide and Norvasc.  Target 130/80 mmHg. 3. No evidence of PVCs or complaints of palpitations. 4. A1c should be less than 7.  Current management with Glucophage is controlling. 5. Continue high intensity statin therapy.  LDL cholesterol 59 when checked in April.  Liver panel is stable. 6. COVID-19 vaccine is been received.  Overall education and awareness concerning primary/secondary risk prevention was discussed in detail: LDL less than 70, hemoglobin A1c less than 7, blood pressure target less than 130/80 mmHg, >150 minutes of moderate aerobic activity per week, avoidance of smoking, weight control (via diet and exercise), and continued surveillance/management of/for obstructive sleep apnea.  Guideline directed therapy for left ventricular systolic dysfunction: Angiotensin receptor-neprilysin inhibitor (ARNI)-Entresto; beta-blocker therapy - carvedilol or metoprolol succinate; mineralocorticoid receptor antagonist (MRA) therapy -spironolactone or eplerenone.  These therapies have been shown to improve clinical outcomes including reduction of rehospitalization survival, and acute heart failure.  SGLT2 therapy is the fourth pillar of guideline directed therapy for heart failure.  Given normalized EF, will hold adding this therapy unless EF again become a challenge.     Medication Adjustments/Labs and Tests Ordered: Current medicines are reviewed at length with the patient today.  Concerns regarding medicines are outlined above.  Orders Placed This Encounter  Procedures  . EKG 12-Lead   No orders of the defined types were placed in this encounter.   There are no Patient Instructions on file for this visit.   Signed, Sinclair Grooms, MD  01/05/2020 9:07 AM    [place]

## 2020-01-05 ENCOUNTER — Encounter: Payer: Self-pay | Admitting: Interventional Cardiology

## 2020-01-05 ENCOUNTER — Other Ambulatory Visit: Payer: Self-pay

## 2020-01-05 ENCOUNTER — Ambulatory Visit: Payer: Medicare Other | Admitting: Interventional Cardiology

## 2020-01-05 VITALS — BP 122/80 | HR 71 | Ht 69.0 in | Wt 287.0 lb

## 2020-01-05 DIAGNOSIS — I5042 Chronic combined systolic (congestive) and diastolic (congestive) heart failure: Secondary | ICD-10-CM | POA: Diagnosis not present

## 2020-01-05 DIAGNOSIS — I493 Ventricular premature depolarization: Secondary | ICD-10-CM

## 2020-01-05 DIAGNOSIS — I10 Essential (primary) hypertension: Secondary | ICD-10-CM

## 2020-01-05 DIAGNOSIS — E1169 Type 2 diabetes mellitus with other specified complication: Secondary | ICD-10-CM

## 2020-01-05 DIAGNOSIS — E118 Type 2 diabetes mellitus with unspecified complications: Secondary | ICD-10-CM

## 2020-01-05 DIAGNOSIS — Z7189 Other specified counseling: Secondary | ICD-10-CM

## 2020-01-05 DIAGNOSIS — E785 Hyperlipidemia, unspecified: Secondary | ICD-10-CM

## 2020-01-05 NOTE — Patient Instructions (Signed)

## 2020-01-18 ENCOUNTER — Other Ambulatory Visit: Payer: Self-pay | Admitting: Internal Medicine

## 2020-01-18 MED FILL — ZOLPIDEM TARTRATE 5 MG TAB: 5 | 90 days supply | Qty: 90 | Fill #1

## 2020-01-18 MED FILL — CARVEDILOL 25 MG TABLET: 25 | 90 days supply | Qty: 180 | Fill #1

## 2020-01-18 MED FILL — MELOXICAM 15 MG TABLET: 15 | 90 days supply | Qty: 90 | Fill #0

## 2020-01-18 MED FILL — ALPRAZolam 0.5 MG TABS: 0.5 | 30 days supply | Qty: 60 | Fill #0

## 2020-01-31 MED FILL — PANTOPRAZOLE SOD DR 40 MG T: 40 | 90 days supply | Qty: 90 | Fill #2

## 2020-02-15 ENCOUNTER — Other Ambulatory Visit: Payer: Self-pay | Admitting: Internal Medicine

## 2020-02-15 MED FILL — ATORVASTATIN CALCIUM 10 MG: 10 | 90 days supply | Qty: 90 | Fill #2

## 2020-02-15 MED FILL — METFORMIN HCL ER 500 MG TB2: 500 | 90 days supply | Qty: 180 | Fill #3

## 2020-02-15 MED FILL — AMLODIPINE BESYLATE 10 MG T: 10 | 90 days supply | Qty: 90 | Fill #1

## 2020-02-16 ENCOUNTER — Other Ambulatory Visit: Payer: Self-pay | Admitting: Internal Medicine

## 2020-02-16 MED FILL — FUROSEMIDE 40 MG TAB: 40 | 90 days supply | Qty: 90 | Fill #0

## 2020-02-16 MED FILL — ALPRAZolam 0.5 MG TABS: 0.5 | 30 days supply | Qty: 60 | Fill #1

## 2020-02-28 ENCOUNTER — Telehealth: Payer: Self-pay | Admitting: Internal Medicine

## 2020-02-28 NOTE — Telephone Encounter (Signed)
Called to talk with patient about the [facility] Call report. Patient has already had Colonoscopy 12/13/19, Diabetic Retinal Screening was done 12/27/19 and he wants to talk about Shingles vaccine when he comes in for his CPE in October.

## 2020-03-08 ENCOUNTER — Other Ambulatory Visit: Payer: Self-pay

## 2020-03-08 ENCOUNTER — Ambulatory Visit (INDEPENDENT_AMBULATORY_CARE_PROVIDER_SITE_OTHER): Payer: Medicare Other | Admitting: Internal Medicine

## 2020-03-08 ENCOUNTER — Encounter: Payer: Self-pay | Admitting: Internal Medicine

## 2020-03-08 VITALS — BP 110/70 | HR 80 | Temp 97.9°F | Ht 69.0 in | Wt 287.0 lb

## 2020-03-08 DIAGNOSIS — I10 Essential (primary) hypertension: Secondary | ICD-10-CM | POA: Diagnosis not present

## 2020-03-08 DIAGNOSIS — E1169 Type 2 diabetes mellitus with other specified complication: Secondary | ICD-10-CM | POA: Diagnosis not present

## 2020-03-08 DIAGNOSIS — E785 Hyperlipidemia, unspecified: Secondary | ICD-10-CM

## 2020-03-08 DIAGNOSIS — N410 Acute prostatitis: Secondary | ICD-10-CM | POA: Diagnosis not present

## 2020-03-08 DIAGNOSIS — R300 Dysuria: Secondary | ICD-10-CM | POA: Diagnosis not present

## 2020-03-08 LAB — POCT URINALYSIS DIPSTICK
Appearance: NEGATIVE
Bilirubin, UA: NEGATIVE
Blood, UA: NEGATIVE
Glucose, UA: NEGATIVE
Ketones, UA: NEGATIVE
Leukocytes, UA: NEGATIVE
Nitrite, UA: NEGATIVE
Odor: NEGATIVE
Protein, UA: NEGATIVE
Spec Grav, UA: 1.01 (ref 1.010–1.025)
Urobilinogen, UA: 0.2 E.U./dL
pH, UA: 6.5 (ref 5.0–8.0)

## 2020-03-08 MED ORDER — CIPROFLOXACIN HCL 500 MG PO TABS: 500.0000 mg | ORAL_TABLET | Freq: Two times a day (BID) | ORAL | 0 refills | Status: DC

## 2020-03-08 MED FILL — CIPROFLOXACIN HCL 500 MG TA: 500 | 10 days supply | Qty: 20 | Fill #0

## 2020-03-08 NOTE — Progress Notes (Signed)
   Subjective:    Patient ID: [name] [name], male    DOB: Oct 25, 1950, 69 y.o.   MRN: [id number]  HPI 69 year old Male seen urgently today with complaint of urinary frequency, urgency, and slight dysuria onset in the night last evening. No hematuria or back pain.  No prior history of urinary tract infections.  Patient has history of hypertension, type 2 diabetes mellitus, atherosclerosis, severe obesity, chronic combined systolic and diastolic heart failure.  Also has history of PVCs.  He has had COVID-19 immunizations.    Review of Systems no fever chills nausea or vomiting.  No back pain.     Objective:   Physical Exam  Blood pressure 110/70 pulse 80 regular temperature 97.9 degrees pulse oximetry 98% weight 287 pounds BMI 42.38  Examination of the prostate is within normal limits.  Does not appear to be tender or particularly boggy.      Assessment & Plan:  Acute onset of urinary frequency and urgency-?  Acute prostatitis  Plan: Urine culture obtained.  Urine dipstick is unremarkable.  Treat with Cipro 500 mg twice daily for 10 days.  Call if symptoms not improving.  Addendum: Urine culture reveals no growth.  Patient improved with antibiotic treatment.

## 2020-03-09 LAB — URINE CULTURE
MICRO NUMBER:: 10899146
Result:: NO GROWTH
SPECIMEN QUALITY:: ADEQUATE

## 2020-03-09 LAB — CBC WITH DIFFERENTIAL/PLATELET
Absolute Monocytes: 625 cells/uL (ref 200–950)
Basophils Absolute: 50 cells/uL (ref 0–200)
Basophils Relative: 1 %
Eosinophils Absolute: 160 cells/uL (ref 15–500)
Eosinophils Relative: 3.2 %
HCT: 39.4 % (ref 38.5–50.0)
Hemoglobin: 13.2 g/dL (ref 13.2–17.1)
Lymphs Abs: 2350 cells/uL (ref 850–3900)
MCH: 31.9 pg (ref 27.0–33.0)
MCHC: 33.5 g/dL (ref 32.0–36.0)
MCV: 95.2 fL (ref 80.0–100.0)
MPV: 10.6 fL (ref 7.5–12.5)
Monocytes Relative: 12.5 %
Neutro Abs: 1815 cells/uL (ref 1500–7800)
Neutrophils Relative %: 36.3 %
Platelets: 260 10*3/uL (ref 140–400)
RBC: 4.14 10*6/uL — ABNORMAL LOW (ref 4.20–5.80)
RDW: 12.1 % (ref 11.0–15.0)
Total Lymphocyte: 47 %
WBC: 5 10*3/uL (ref 3.8–10.8)

## 2020-03-09 LAB — URINALYSIS, MICROSCOPIC ONLY
Bacteria, UA: NONE SEEN /HPF
Hyaline Cast: NONE SEEN /LPF
RBC / HPF: NONE SEEN /HPF (ref 0–2)
Squamous Epithelial / HPF: NONE SEEN /HPF (ref <=5)
WBC, UA: NONE SEEN /HPF (ref 0–5)

## 2020-03-15 MED FILL — ENTRESTO 97 MG-103 MG TAB: 97-103 | 30 days supply | Qty: 60 | Fill #3

## 2020-03-15 MED FILL — SPIRONOLACTONE 25 MG TABS: 25 | 90 days supply | Qty: 180 | Fill #3

## 2020-03-15 MED FILL — ALPRAZolam 0.5 MG TABS: 0.5 | 30 days supply | Qty: 60 | Fill #2

## 2020-03-16 ENCOUNTER — Telehealth: Payer: Self-pay | Admitting: Interventional Cardiology

## 2020-03-16 NOTE — Telephone Encounter (Signed)
I called and spoke to pt about the cost of his Entresto. I am mailing him the Pt Asst form, he will complete his part and bring that with documentation needed to the office for [place] to complete our part and fax.

## 2020-03-16 NOTE — Telephone Encounter (Signed)
Spoke with pt and he just went to the pharmacy to pick up medication and Entresto was $155 for a 30 day supply.  Pt went ahead and picked it up. Pt unable to afford this on a regular basis.  Advised he has likely fallen into the donut hole.  Advised I will send message to Jeani Hawking, our prior auth nurse, to see if pt can get pt assistance or if there are other options through Entresto to assist pt.  Pt very appreciative for call.

## 2020-03-16 NOTE — Telephone Encounter (Signed)
Patient is calling to speak with Dr. Thompson Caul nurse in regards to his medication, Crestor, he states he just has some questions he needs to ask her. He did state he is currently taking medication. I do not see it on his medication list.

## 2020-03-25 ENCOUNTER — Encounter: Payer: Self-pay | Admitting: Internal Medicine

## 2020-03-25 NOTE — Patient Instructions (Signed)
Take Cipro 500 mg twice daily for 10 days and call if symptoms not improving.  Urine culture obtained.

## 2020-04-04 ENCOUNTER — Telehealth: Payer: Self-pay

## 2020-04-04 NOTE — Telephone Encounter (Signed)
**Note De-Identified  Obfuscation** The pts completed [employer] Pt Asst application was left at the office. I have completed the MD page of the application and have emailed all to Dr Darliss Ridgel nurse so she can obtain his signature and to fax all to [place] at the fax number written on the cover letter included.

## 2020-04-06 NOTE — Telephone Encounter (Signed)
Paperwork signed and faxed to [employer].

## 2020-04-10 MED FILL — PANTOPRAZOLE SOD DR 40 MG T: 40 | 90 days supply | Qty: 90 | Fill #3

## 2020-04-10 MED FILL — ENTRESTO 97 MG-103 MG TAB: 97-103 | 30 days supply | Qty: 60 | Fill #4

## 2020-04-10 MED FILL — CARVEDILOL 25 MG TABLET: 25 | 90 days supply | Qty: 180 | Fill #2

## 2020-04-11 ENCOUNTER — Other Ambulatory Visit: Payer: Self-pay | Admitting: Internal Medicine

## 2020-04-11 NOTE — Telephone Encounter (Signed)
rx pended, please sign.

## 2020-04-11 NOTE — Telephone Encounter (Signed)
**Note De-Identified  Obfuscation** Letter received from [employer] stating that they received the pts application without the prescribing physician's signature. I have emailed the application back to Dr Darliss Ridgel nurse so she can obtain his signature and to fax to [employer] at fax number written on cover letter included.

## 2020-04-11 NOTE — Telephone Encounter (Signed)
Refill x 6 months 

## 2020-04-12 NOTE — Telephone Encounter (Signed)
Paperwork signed by Dr. Tamala Julian.  Faxed to [employer].

## 2020-04-13 MED FILL — ALPRAZolam 0.5 MG TABS: 0.5 | 30 days supply | Qty: 60 | Fill #3

## 2020-04-17 MED FILL — ZOLPIDEM TARTRATE 5 MG TAB: 5 | 90 days supply | Qty: 90 | Fill #0

## 2020-04-18 NOTE — Telephone Encounter (Signed)
**Note De-Identified  Obfuscation** Letter received from [employer] Pt Asst stating that they approved the pt for asst with his Entresto. Approval is valid until 07/07/2020  Pt ID: [id number]  The letter states that they have also notified the pt of this approval as well.

## 2020-04-21 ENCOUNTER — Other Ambulatory Visit: Payer: Self-pay

## 2020-04-21 ENCOUNTER — Other Ambulatory Visit: Payer: Medicare Other | Admitting: Internal Medicine

## 2020-04-21 DIAGNOSIS — I5042 Chronic combined systolic (congestive) and diastolic (congestive) heart failure: Secondary | ICD-10-CM | POA: Diagnosis not present

## 2020-04-21 DIAGNOSIS — E785 Hyperlipidemia, unspecified: Secondary | ICD-10-CM | POA: Diagnosis not present

## 2020-04-21 DIAGNOSIS — E7800 Pure hypercholesterolemia, unspecified: Secondary | ICD-10-CM

## 2020-04-21 DIAGNOSIS — E119 Type 2 diabetes mellitus without complications: Secondary | ICD-10-CM | POA: Diagnosis not present

## 2020-04-21 DIAGNOSIS — G4709 Other insomnia: Secondary | ICD-10-CM

## 2020-04-21 DIAGNOSIS — E1169 Type 2 diabetes mellitus with other specified complication: Secondary | ICD-10-CM | POA: Diagnosis not present

## 2020-04-21 DIAGNOSIS — I700 Atherosclerosis of aorta: Secondary | ICD-10-CM

## 2020-04-21 DIAGNOSIS — Z125 Encounter for screening for malignant neoplasm of prostate: Secondary | ICD-10-CM

## 2020-04-21 DIAGNOSIS — Z0000 Encounter for general adult medical examination without abnormal findings: Secondary | ICD-10-CM

## 2020-04-22 LAB — CBC WITH DIFFERENTIAL/PLATELET
Absolute Monocytes: 567 cells/uL (ref 200–950)
Basophils Absolute: 41 cells/uL (ref 0–200)
Basophils Relative: 0.9 %
Eosinophils Absolute: 108 cells/uL (ref 15–500)
Eosinophils Relative: 2.4 %
HCT: 39 % (ref 38.5–50.0)
Hemoglobin: 13 g/dL — ABNORMAL LOW (ref 13.2–17.1)
Lymphs Abs: 2381 cells/uL (ref 850–3900)
MCH: 32.1 pg (ref 27.0–33.0)
MCHC: 33.3 g/dL (ref 32.0–36.0)
MCV: 96.3 fL (ref 80.0–100.0)
MPV: 10.3 fL (ref 7.5–12.5)
Monocytes Relative: 12.6 %
Neutro Abs: 1404 cells/uL — ABNORMAL LOW (ref 1500–7800)
Neutrophils Relative %: 31.2 %
Platelets: 234 10*3/uL (ref 140–400)
RBC: 4.05 10*6/uL — ABNORMAL LOW (ref 4.20–5.80)
RDW: 12 % (ref 11.0–15.0)
Total Lymphocyte: 52.9 %
WBC: 4.5 10*3/uL (ref 3.8–10.8)

## 2020-04-22 LAB — LIPID PANEL
Cholesterol: 126 mg/dL (ref <200)
HDL: 50 mg/dL (ref >=40)
LDL Cholesterol (Calc): 59 mg/dL (calc)
Non-HDL Cholesterol (Calc): 76 mg/dL (calc) (ref <130)
Total CHOL/HDL Ratio: 2.5 (calc) (ref <5.0)
Triglycerides: 83 mg/dL (ref <150)

## 2020-04-22 LAB — COMPLETE METABOLIC PANEL WITH GFR
AG Ratio: 1.3 (calc) (ref 1.0–2.5)
ALT: 9 U/L (ref 9–46)
AST: 14 U/L (ref 10–35)
Albumin: 4.3 g/dL (ref 3.6–5.1)
Alkaline phosphatase (APISO): 66 U/L (ref 35–144)
BUN: 14 mg/dL (ref 7–25)
CO2: 24 mmol/L (ref 20–32)
Calcium: 9.5 mg/dL (ref 8.6–10.3)
Chloride: 104 mmol/L (ref 98–110)
Creat: 1.05 mg/dL (ref 0.70–1.25)
GFR, Est African American: 85 mL/min/{1.73_m2} (ref >=60)
GFR, Est Non African American: 73 mL/min/{1.73_m2} (ref >=60)
Globulin: 3.2 g/dL (calc) (ref 1.9–3.7)
Glucose, Bld: 107 mg/dL — ABNORMAL HIGH (ref 65–99)
Potassium: 4.1 mmol/L (ref 3.5–5.3)
Sodium: 138 mmol/L (ref 135–146)
Total Bilirubin: 1.5 mg/dL — ABNORMAL HIGH (ref 0.2–1.2)
Total Protein: 7.5 g/dL (ref 6.1–8.1)

## 2020-04-22 LAB — PSA: PSA: 0.56 ng/mL (ref <=4.0)

## 2020-04-22 LAB — HEMOGLOBIN A1C
Hgb A1c MFr Bld: 6.1 % of total Hgb — ABNORMAL HIGH (ref <5.7)
Mean Plasma Glucose: 128 (calc)
eAG (mmol/L): 7.1 (calc)

## 2020-04-22 LAB — MICROALBUMIN / CREATININE URINE RATIO
Creatinine, Urine: 20 mg/dL (ref 20–320)
Microalb, Ur: <0.2 mg/dL

## 2020-04-25 ENCOUNTER — Other Ambulatory Visit: Payer: Self-pay

## 2020-04-25 ENCOUNTER — Ambulatory Visit (INDEPENDENT_AMBULATORY_CARE_PROVIDER_SITE_OTHER): Payer: Medicare Other | Admitting: Internal Medicine

## 2020-04-25 ENCOUNTER — Encounter: Payer: Self-pay | Admitting: Internal Medicine

## 2020-04-25 VITALS — BP 110/80 | HR 72 | Ht 69.0 in | Wt 274.0 lb

## 2020-04-25 DIAGNOSIS — E785 Hyperlipidemia, unspecified: Secondary | ICD-10-CM

## 2020-04-25 DIAGNOSIS — I5042 Chronic combined systolic (congestive) and diastolic (congestive) heart failure: Secondary | ICD-10-CM

## 2020-04-25 DIAGNOSIS — Z0000 Encounter for general adult medical examination without abnormal findings: Secondary | ICD-10-CM | POA: Diagnosis not present

## 2020-04-25 DIAGNOSIS — Z23 Encounter for immunization: Secondary | ICD-10-CM | POA: Diagnosis not present

## 2020-04-25 DIAGNOSIS — E1169 Type 2 diabetes mellitus with other specified complication: Secondary | ICD-10-CM

## 2020-04-25 DIAGNOSIS — M1711 Unilateral primary osteoarthritis, right knee: Secondary | ICD-10-CM

## 2020-04-25 DIAGNOSIS — I700 Atherosclerosis of aorta: Secondary | ICD-10-CM

## 2020-04-25 DIAGNOSIS — Z6841 Body Mass Index (BMI) 40.0 and over, adult: Secondary | ICD-10-CM

## 2020-04-25 DIAGNOSIS — I10 Essential (primary) hypertension: Secondary | ICD-10-CM

## 2020-04-25 DIAGNOSIS — E7800 Pure hypercholesterolemia, unspecified: Secondary | ICD-10-CM

## 2020-04-25 DIAGNOSIS — G4709 Other insomnia: Secondary | ICD-10-CM

## 2020-04-25 LAB — POCT URINALYSIS DIPSTICK
Appearance: NEGATIVE
Bilirubin, UA: NEGATIVE
Blood, UA: NEGATIVE
Glucose, UA: NEGATIVE
Ketones, UA: NEGATIVE
Leukocytes, UA: NEGATIVE
Nitrite, UA: NEGATIVE
Odor: NEGATIVE
Protein, UA: NEGATIVE
Spec Grav, UA: 1.01 (ref 1.010–1.025)
Urobilinogen, UA: 0.2 E.U./dL
pH, UA: 6.5 (ref 5.0–8.0)

## 2020-04-25 NOTE — Progress Notes (Signed)
Subjective:    Patient ID: [name], male    DOB: [birth year], 69 y.o.   MRN: [id number]  HPI 69 year old Male seen for first annual Medicare wellness visit, health maintenance exam and evaluation of medical issues.  He has a history of diabetes, hypertension, obesity, gout GE reflux, erectile dysfunction.  In early September had a bout of acute prostatitis treated with Cipro for 10 days and improved.  History of chronic combined systolic and diastolic heart failure followed by [name]. Found to have systolic ejection fraction less than or equal to 35%. He denies angina. He denies dyspnea. No syncope.  Enjoys working in he is church and has many roles in his church.  Recent echocardiogram April 2021 showed improvement in ejection fraction to 55 to 60% and normal left ventricular function. Left atrium is moderately dilated. Mild to moderate aortic valve sclerosis/calcification present without aortic stenosis. Mitral valve was normal. EKG was within normal limits. T wave inversion noted previously has resolved. He is being treated with [name] all 25 mg twice daily, Lipitor 10 mg daily, Norvasc 10 mg daily Lasix 40 mg daily. He is also on Entresto twice daily. He also takes Aldactone twice daily.  Hemoglobin A1c is excellent at 6.1%. Lipid panel is normal. PSA is normal. CBC is within normal limits.  History of Schatzki's ring dilated by [name] in 2008  Hospitalized with acute cholecystitis July 2017. He had ERCP and laparoscopic cholecystectomy.  History of left knee arthroplasty by [name] January 2020. Needs to have right knee arthroplasty but wants to defer that until after the pandemic. History of insomnia and anxiety.  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 Wife is retired from the school system. He does not consume alcohol or smoke. One son with history of kidney transplant due to hypertension  and chronic kidney disease. Three adult children, two sons and a daughter.  Family history: Mother died at age 61 in 73 of a CVA.      Review of Systems no new complaints-feels well     Objective:   Physical Exam Blood pressure 110/80, pulse 72, pulse oximetry 97% weight 274 pounds BMI 40.46 height 5 feet 9 inches  Skin is warm and dry. No cervical adenopathy. No carotid bruits. No thyromegaly. No JVD. Chest is clear to auscultation without rales or wheezing. Cardiac exam reveals regular rate and rhythm normal S1 and S2 without murmurs or gallops. Abdomen is obese soft nondistended without hepatosplenomegaly masses or tenderness. Prostate is without nodules. No lower extremity pitting edema. Neurological exam intact without focal deficits. Affect thought and judgment are normal.       Assessment & Plan:  Combined systolic and diastolic congestive heart failure followed by [name] is stable on current regimen  Diabetes mellitus with excellent control  Essential hypertension-stable on current regimen  History of PVCs  Hyperlipidemia associated with diabetes and controlled with statin medication  Obesity  Plan: Patient will continue with current medications and follow-up in 6 months. He will continue to work on diet exercise and weight loss. No change in medication regimen.  He has had two COVID-19 immunizations and should get booster when available. Flu vaccine given.  Subjective:   Patient presents for Medicare Annual/Subsequent preventive examination.  Review Past Medical/Family/Social: See above   Risk Factors  Current exercise habits: Walks and is physically active Dietary issues discussed: Low-fat low carbohydrate  Cardiac  risk factors: Diabetes and hyperlipidemia. Mother died of a stroke  Depression Screen  (Note: if answer to either of the following is "Yes", a more complete depression screening is indicated)   Over the past two weeks, have you felt  down, depressed or hopeless? No  Over the past two weeks, have you felt little interest or pleasure in doing things? No Have you lost interest or pleasure in daily life? No Do you often feel hopeless? No Do you cry easily over simple problems? No   Activities of Daily Living  In your present state of health, do you have any difficulty performing the following activities?:   Driving? No  Managing money? No  Feeding yourself? No  Getting from bed to chair? No  Climbing a flight of stairs? No  Preparing food and eating?: No  Bathing or showering? No  Getting dressed: No  Getting to the toilet? No  Using the toilet:No  Moving around from place to place: No  In the past year have you fallen or had a near fall?:No  Are you sexually active? Yes Do you have more than one partner? No   Hearing Difficulties: No  Do you often ask people to speak up or repeat themselves? No  Do you experience ringing or noises in your ears? No  Do you have difficulty understanding soft or whispered voices? No  Do you feel that you have a problem with memory? No Do you often misplace items? No    Home Safety:  Do you have a smoke alarm at your residence? Yes Do you have grab bars in the bathroom? None Do you have throw rugs in your house? No   Cognitive Testing  Alert? Yes Normal Appearance?Yes  Oriented to person? Yes Place? Yes  Time? Yes  Recall of three objects? Yes  Can perform simple calculations? Yes  Displays appropriate judgment?Yes  Can read the correct time from a watch face?Yes   List the Names of Other Physician/Practitioners you currently use:  See referral list for the physicians patient is currently seeing.  [name]-cardiology    review of Systems: See above   Objective:     General appearance: Appears stated age and  obese  Head: Normocephalic, without obvious abnormality, atraumatic  Eyes: conj clear, EOMi PEERLA  Ears: normal TM's and external ear canals both  ears  Nose: Nares normal. Septum midline. Mucosa normal. No drainage or sinus tenderness.  Throat: lips, mucosa, and tongue normal; teeth and gums normal  Neck: no adenopathy, no carotid bruit, no JVD, supple, symmetrical, trachea midline and thyroid not enlarged, symmetric, no tenderness/mass/nodules  No CVA tenderness.  Lungs: clear to auscultation bilaterally  Breasts: normal appearance, no masses or tenderness  Heart: regular rate and rhythm, S1, S2 normal, no murmur, click, rub or gallop  Abdomen: soft, non-tender; bowel sounds normal; no masses, no organomegaly  Musculoskeletal: ROM normal in all joints, no crepitus, no deformity, Normal muscle strengthen. Back  is symmetric, no curvature. Skin: Skin color, texture, turgor normal. No rashes or lesions  Lymph nodes: Cervical, supraclavicular, and axillary nodes normal.  Neurologic: CN 2 -12 Normal, Normal symmetric reflexes. Normal coordination and gait  Psych: Alert & Oriented x 3, Mood appear stable.    Assessment:    Annual wellness medicare exam   Plan:    During the course of the visit the patient was educated and counseled about appropriate screening and preventive services including:     Annual flu vaccine  Get Covid booster #3 when available   Patient Instructions (the written plan) was given to the patient.  Medicare Attestation  I have personally reviewed:  The patient's medical and social history  Their use of alcohol, tobacco or illicit drugs  Their current medications and supplements  The patient's functional ability including ADLs,fall risks, home safety risks, cognitive, and hearing and visual impairment  Diet and physical activities  Evidence for depression or mood disorders  The patient's weight, height, BMI, and visual acuity have been recorded in the chart. I have made referrals, counseling, and provided education to the patient based on review of the above and I have provided the patient with a written  personalized care plan for preventive services.

## 2020-05-07 ENCOUNTER — Encounter: Payer: Self-pay | Admitting: Internal Medicine

## 2020-05-07 NOTE — Patient Instructions (Signed)
It was a pleasure to see you today. Keep up your good work with diabetic control. Continue diet and exercise efforts. Follow-up in 6 months.

## 2020-05-17 ENCOUNTER — Other Ambulatory Visit: Payer: Self-pay | Admitting: Internal Medicine

## 2020-05-17 MED FILL — ALPRAZolam 0.5 MG TABS: 0.5 | 30 days supply | Qty: 60 | Fill #4

## 2020-05-17 MED FILL — AMLODIPINE BESYLATE 10 MG T: 10 | 90 days supply | Qty: 90 | Fill #2

## 2020-05-17 MED FILL — FUROSEMIDE 40 MG TAB: 40 | 90 days supply | Qty: 90 | Fill #1

## 2020-05-17 MED FILL — ATORVASTATIN CALCIUM 10 MG: 10 | 90 days supply | Qty: 90 | Fill #3

## 2020-05-18 ENCOUNTER — Other Ambulatory Visit: Payer: Self-pay | Admitting: Internal Medicine

## 2020-05-18 MED FILL — METFORMIN HCL ER 500 MG TB2: 500 | 90 days supply | Qty: 180 | Fill #0

## 2020-06-16 ENCOUNTER — Other Ambulatory Visit: Payer: Self-pay | Admitting: Interventional Cardiology

## 2020-06-16 MED FILL — MELOXICAM 15 MG TABLET: 15 | 90 days supply | Qty: 90 | Fill #1

## 2020-06-16 MED FILL — ALPRAZolam 0.5 MG TABS: 0.5 | 30 days supply | Qty: 60 | Fill #5

## 2020-06-16 MED FILL — SPIRONOLACTONE 25 MG TABS: 25 | 90 days supply | Qty: 180 | Fill #0

## 2020-06-19 NOTE — Progress Notes (Signed)
Cardiology Office Note:    Date:  06/21/2020   ID:  [name], DOB 04/06/1951, MRN [id number]  PCP:  Elby Showers, MD  Cardiologist:  Sinclair Grooms, MD   Referring MD: Elby Showers, MD   Chief Complaint  Patient presents with  . Coronary Artery Disease  . Congestive Heart Failure  . Hypertension    History of Present Illness:    [name] is a 69 y.o. male with a hx of type 2 diabetes mellitus, hypertension, abdominal atherosclerosis, nonobstructive/minimal atherosclerosis in coronary arteries, severe obesity, and systolic left ventricular dysfunction (EF 35%,6/ 2019 -->60%, 10/2019) on GDMT.  He is doing well on his current medication regimen.  He denies angina, orthopnea, PND, peripheral edema, palpitations, and claudication.  No medication side effects.  Past Medical History:  Diagnosis Date  . Arthritis   . Diabetes mellitus without complication ([place])   . DJD (degenerative joint disease) of knee    Left  . Hx of adenomatous colonic polyps 12/2019   2 diminutive  . Hypertension     Past Surgical History:  Procedure Laterality Date  . CARDIAC CATHETERIZATION  07/23/2018  . CHOLECYSTECTOMY N/A 02/04/2016   Procedure: LAPAROSCOPIC CHOLECYSTECTOMY;  Surgeon: Leighton Ruff, MD;  Location: [facility];  Service: General;  Laterality: N/A;  . COLONOSCOPY  12/13/2019  . ERCP N/A 02/02/2016   Procedure: ENDOSCOPIC RETROGRADE CHOLANGIOPANCREATOGRAPHY (ERCP);  Surgeon: [name], MD;  Location: [facility];  Service: Endoscopy;  Laterality: N/A;  . HERNIA REPAIR    . RIGHT/LEFT HEART CATH AND CORONARY ANGIOGRAPHY N/A 07/23/2018   Procedure: RIGHT/LEFT HEART CATH AND CORONARY ANGIOGRAPHY;  Surgeon: Belva Crome, MD;  Location: [facility];  Service: Cardiovascular;  Laterality: N/A;  . SPINE SURGERY    . TOTAL KNEE ARTHROPLASTY Left 07/28/2018   Procedure: TOTAL KNEE ARTHROPLASTY;  Surgeon: Melrose Nakayama, MD;  Location: [facility];  Service:  Orthopedics;  Laterality: Left;    Current Medications: Current Meds  Medication Sig  . ALPRAZolam (XANAX) 0.5 MG tablet TAKE 1 TABLET BY MOUTH TWICE A DAY AS NEEDED FOR ANXIETY  . amLODipine (NORVASC) 10 MG tablet TAKE 1 TABLET BY MOUTH DAILY.  [name] aspirin EC 81 MG tablet Take 1 tablet (81 mg total) by mouth daily.  [name] atorvastatin (LIPITOR) 10 MG tablet TAKE 1 TABLET BY MOUTH DAILY.  . carvedilol (COREG) 25 MG tablet TAKE 1 TABLET BY MOUTH 2 TIMES DAILY.  . furosemide (LASIX) 40 MG tablet TAKE 1 TABLET BY MOUTH DAILY  . meloxicam (MOBIC) 15 MG tablet TAKE 1 TABLET BY MOUTH ONCE DAILY  . metFORMIN (GLUCOPHAGE-XR) 500 MG 24 hr tablet TAKE 1 TABLET BY MOUTH TWICE A DAY  . Olopatadine HCl 0.7 % SOLN Apply 1 drop to eye daily.  [name] omeprazole (PRILOSEC) 20 MG capsule Take 1 capsule (20 mg total) by mouth daily.  . sacubitril-valsartan (ENTRESTO) 97-103 MG Take 1 tablet by mouth 2 (two) times daily.  [name] spironolactone (ALDACTONE) 25 MG tablet TAKE 1 TABLET (25 MG TOTAL) BY MOUTH 2 TIMES DAILY.  [name] zolpidem (AMBIEN) 5 MG tablet TAKE 1 TABLET (5 MG TOTAL) BY MOUTH AT BEDTIME AS NEEDED FOR SLEEP.     Allergies:   Patient has no known allergies.   Social History   Socioeconomic History  . Marital status: Married    Spouse name: Not on file  . Number of children: Not on file  . Years of education: Not on file  .  Highest education level: Not on file  Occupational History  . Not on file  Tobacco Use  . Smoking status: Never Smoker  . Smokeless tobacco: Never Used  Vaping Use  . Vaping Use: Never used  Substance and Sexual Activity  . Alcohol use: No  . Drug use: No  . Sexual activity: Yes  Other Topics Concern  . Not on file  Social History Narrative   Retired from [employer] Strain: Not on file  Food Insecurity: Not on file  Transportation Needs: Not on file  Physical Activity: Not on file  Stress: Not on file  Social  Connections: Not on file     Family History: The patient's family history includes Hypertension in his mother; Stroke in his mother. There is no history of Colon cancer, Stomach cancer, Rectal cancer, or Pancreatic cancer.  ROS:   Please see the history of present illness.    He downgraded proton pump inhibitor intensity to Prilosec from Protonix and occasionally has heartburn.  He will speak with Dr. Renold Genta concerning this.  All other systems reviewed and are negative.  EKGs/Labs/Other Studies Reviewed:    The following studies were reviewed today:  ECHOCARDIOGRAM 10/2019 IMPRESSIONS    1. Left ventricular ejection fraction, by estimation, is 55 to 60%. The  left ventricle has normal function. The left ventricle has no regional  wall motion abnormalities. There is mild left ventricular hypertrophy.  Left ventricular diastolic parameters  were normal.  2. Right ventricular systolic function is normal. The right ventricular  size is normal. There is mildly elevated pulmonary artery systolic  pressure.  3. Left atrial size was moderately dilated.  4. The mitral valve is normal in structure. Trivial mitral valve  regurgitation. No evidence of mitral stenosis.  5. The aortic valve is tricuspid. Aortic valve regurgitation is not  visualized. Mild to moderate aortic valve sclerosis/calcification is  present, without any evidence of aortic stenosis.  6. The inferior vena cava is normal in size with greater than 50%  respiratory variability, suggesting right atrial pressure of 3 mmHg.   EKG:  EKG no new data.  EKG from June revealed first-degree AV block with nonspecific T wave flattening.  Recent Labs: 04/21/2020: ALT 9; BUN 14; Creat 1.05; Hemoglobin 13.0; Platelets 234; Potassium 4.1; Sodium 138  Recent Lipid Panel    Component Value Date/Time   CHOL 126 04/21/2020 0904   TRIG 83 04/21/2020 0904   HDL 50 04/21/2020 0904   CHOLHDL 2.5 04/21/2020 0904   VLDL 14 10/18/2016  1048   LDLCALC 59 04/21/2020 0904    Physical Exam:    VS:  BP (!) 142/78   Pulse 71   Ht 5\' 10"  (1.778 m)   Wt 277 lb 12.8 oz (126 kg)   SpO2 98%   BMI 39.86 kg/m     Wt Readings from Last 3 Encounters:  06/21/20 277 lb 12.8 oz (126 kg)  04/25/20 274 lb (124.3 kg)  03/08/20 287 lb (130.2 kg)     GEN: Obese. No acute distress HEENT: Normal NECK: No JVD. LYMPHATICS: No lymphadenopathy CARDIAC: No murmur. RRR no gallop, or edema. VASCULAR:  Normal Pulses. No bruits. RESPIRATORY:  Clear to auscultation without rales, wheezing or rhonchi  ABDOMEN: Soft, non-tender, non-distended, No pulsatile mass, MUSCULOSKELETAL: No deformity  SKIN: Warm and dry NEUROLOGIC:  Alert and oriented x 3 PSYCHIATRIC:  Normal affect   ASSESSMENT:    1. Chronic  combined systolic and diastolic heart failure ([place])   2. Essential hypertension   3. PVC's (premature ventricular contractions)   4. Type 2 diabetes mellitus with complication, without long-term current use of insulin ([place])   5. Hyperlipidemia associated with type 2 diabetes mellitus ([place])   6. Educated about COVID-19 virus infection   7. Abdominal aortic atherosclerosis ([place])    PLAN:    In order of problems listed above:  1. Continue current three-pronged therapeutic regimen for systolic dysfunction that includes Aldactone, Entresto, and carvedilol.  Bmet will be done every 4 months to ensure kidney function and potassium are at baseline. 2. Primary hypertension with borderline control.  Frequent systolic blood pressures in the 1 30-1 40 range at home.  Diastolics are running less than 70.  We discussed 6 hours of sleep, less than 3 g sodium diet, avoidance of nonsteroidal anti-inflammatory agents and frequent alcohol (he does not drink).  If we need to uptitrate therapy may consider further increase in Aldactone or adding hydralazine/long-acting nitrates. 3. Not a concern 4. Consider SGLT2 therapy in the future plus or minus  Metformin.  There is data on cardioprotection and kidney protection with this class of medications (a new agent not yet approved by the FDA, sotagliflozin has enhanced data compared to current agents and also a significant impact on weight). 5. Continue low intensity statin therapy 6. Vaccinated but still practicing medication. 7. Continue risk prevention.  Guideline directed therapy for left ventricular systolic dysfunction: Angiotensin receptor-neprilysin inhibitor (ARNI)-Entresto; beta-blocker therapy - carvedilol, metoprolol succinate, or bisoprolol; mineralocorticoid receptor antagonist (MRA) therapy -spironolactone or eplerenone.  SGLT-2 agents -  Dapagliflozin [place]) or Empagliflozin ([place]).These therapies have been shown to improve clinical outcomes including reduction of rehospitalization, survival, and acute heart failure.     Medication Adjustments/Labs and Tests Ordered: Current medicines are reviewed at length with the patient today.  Concerns regarding medicines are outlined above.  No orders of the defined types were placed in this encounter.  No orders of the defined types were placed in this encounter.   There are no Patient Instructions on file for this visit.   Signed, Sinclair Grooms, MD  06/21/2020 9:23 AM    [place]

## 2020-06-21 ENCOUNTER — Encounter: Payer: Self-pay | Admitting: Interventional Cardiology

## 2020-06-21 ENCOUNTER — Ambulatory Visit: Payer: Medicare Other | Admitting: Interventional Cardiology

## 2020-06-21 ENCOUNTER — Other Ambulatory Visit: Payer: Self-pay

## 2020-06-21 VITALS — BP 142/78 | HR 71 | Ht 70.0 in | Wt 277.8 lb

## 2020-06-21 DIAGNOSIS — I493 Ventricular premature depolarization: Secondary | ICD-10-CM

## 2020-06-21 DIAGNOSIS — I5042 Chronic combined systolic (congestive) and diastolic (congestive) heart failure: Secondary | ICD-10-CM

## 2020-06-21 DIAGNOSIS — E1169 Type 2 diabetes mellitus with other specified complication: Secondary | ICD-10-CM | POA: Diagnosis not present

## 2020-06-21 DIAGNOSIS — I10 Essential (primary) hypertension: Secondary | ICD-10-CM | POA: Diagnosis not present

## 2020-06-21 DIAGNOSIS — E785 Hyperlipidemia, unspecified: Secondary | ICD-10-CM

## 2020-06-21 DIAGNOSIS — I700 Atherosclerosis of aorta: Secondary | ICD-10-CM

## 2020-06-21 DIAGNOSIS — E118 Type 2 diabetes mellitus with unspecified complications: Secondary | ICD-10-CM

## 2020-06-21 DIAGNOSIS — Z7189 Other specified counseling: Secondary | ICD-10-CM

## 2020-06-21 NOTE — Patient Instructions (Signed)
Medication Instructions:  Your physician recommends that you continue on your current medications as directed. Please refer to the Current Medication list given to you today.  *If you need a refill on your cardiac medications before your next appointment, please call your pharmacy*   Lab Work: BMET in February  If you have labs (blood work) drawn today and your tests are completely normal, you will receive your results only by: [name] MyChart Message (if you have MyChart) OR . A paper copy in the mail If you have any lab test that is abnormal or we need to change your treatment, we will call you to review the results.   Testing/Procedures: None   Follow-Up: At [facility], you and your health needs are our priority.  As part of our continuing mission to provide you with exceptional heart care, we have created designated Provider Care Teams.  These Care Teams include your primary Cardiologist (physician) and Advanced Practice Providers (APPs -  Physician Assistants and Nurse Practitioners) who all work together to provide you with the care you need, when you need it.  We recommend signing up for the patient portal called "MyChart".  Sign up information is provided on this After Visit Summary.  MyChart is used to connect with patients for Virtual Visits (Telemedicine).  Patients are able to view lab/test results, encounter notes, upcoming appointments, etc.  Non-urgent messages can be sent to your provider as well.   To learn more about what you can do with MyChart, go to [email or web address].    Your next appointment:   9-12 month(s)  The format for your next appointment:   In Person  Provider:   You may see Sinclair Grooms, MD or one of the following Advanced Practice Providers on your designated Care Team:    Truitt Merle, NP  Cecilie Kicks, NP  Kathyrn Drown, NP    Other Instructions

## 2020-06-29 ENCOUNTER — Encounter: Payer: Self-pay | Admitting: Internal Medicine

## 2020-06-29 ENCOUNTER — Other Ambulatory Visit: Payer: Self-pay | Admitting: Internal Medicine

## 2020-06-29 ENCOUNTER — Telehealth: Payer: Self-pay | Admitting: Internal Medicine

## 2020-06-29 MED ORDER — CIPROFLOXACIN HCL 500 MG PO TABS: 500.0000 mg | ORAL_TABLET | Freq: Two times a day (BID) | ORAL | 0 refills | Status: DC

## 2020-06-29 MED ORDER — ESOMEPRAZOLE MAGNESIUM 40 MG PO CPDR: 40.0000 mg | DELAYED_RELEASE_CAPSULE | Freq: Every day | ORAL | 1 refills | Status: DC

## 2020-06-29 MED FILL — ESOMEPRAZOLE MAG DR 40 MG C: 40 | 90 days supply | Qty: 90 | Fill #0

## 2020-06-29 MED FILL — CIPROFLOXACIN HCL 500 MG TA: 500 | 10 days supply | Qty: 20 | Fill #0

## 2020-06-29 NOTE — Telephone Encounter (Addendum)
[name] [phone]  [name] called to say he is urinating every 1 1/2 to 2 hours, however I did question him about how much he was drinking. He is drinking about 12 ounces of coffee, 8 ounces of cola, and around 60 ounces of water a day. He does not get up at night.  Patient is not having dysuria just urinary frequency but he is drinking a fair amount of fluid.  He does not have nocturia.  I am not going to prescribe Flomax at this point in time.  He may be developing prostatitis once again.  I am going to call in Cipro 500 mg twice daily for 10 days.  He can follow-up next week.  He is also having worsening GE reflux symptoms on omeprazole.  I have prescribed generic Nexium 40 mg daily instead.

## 2020-07-05 ENCOUNTER — Encounter: Payer: Self-pay | Admitting: Internal Medicine

## 2020-07-05 ENCOUNTER — Other Ambulatory Visit: Payer: Self-pay

## 2020-07-05 ENCOUNTER — Other Ambulatory Visit: Payer: Self-pay | Admitting: Internal Medicine

## 2020-07-05 ENCOUNTER — Ambulatory Visit (INDEPENDENT_AMBULATORY_CARE_PROVIDER_SITE_OTHER): Payer: Medicare Other | Admitting: Internal Medicine

## 2020-07-05 VITALS — BP 120/70 | HR 73 | Temp 97.7°F | Ht 70.0 in | Wt 269.0 lb

## 2020-07-05 DIAGNOSIS — R350 Frequency of micturition: Secondary | ICD-10-CM

## 2020-07-05 LAB — POCT URINALYSIS DIPSTICK
Appearance: NEGATIVE
Bilirubin, UA: NEGATIVE
Glucose, UA: NEGATIVE
Ketones, UA: NEGATIVE
Leukocytes, UA: NEGATIVE
Nitrite, UA: NEGATIVE
Odor: NEGATIVE
Protein, UA: NEGATIVE
Spec Grav, UA: 1.01 (ref 1.010–1.025)
Urobilinogen, UA: 0.2 E.U./dL
pH, UA: 6.5 (ref 5.0–8.0)

## 2020-07-05 MED ORDER — TAMSULOSIN HCL 0.4 MG PO CAPS: 0.4000 mg | ORAL_CAPSULE | Freq: Every day | ORAL | 0 refills | Status: DC

## 2020-07-05 MED FILL — TAMSULOSIN HCL 0.4 MG CAP: 0.4 | 30 days supply | Qty: 30 | Fill #0

## 2020-07-05 NOTE — Telephone Encounter (Signed)
He needs visit today. He may have a stone, enlarged prostate, or an infection.  He will need urine for u/a and culture today as well as office visit.

## 2020-07-05 NOTE — Progress Notes (Signed)
   Subjective:    Patient ID: [name], male    DOB: 1951-05-26, 69 y.o.   MRN: [id number]  HPI 69 year old Male seen today with complaint of suprapubic discomfort and urinary frequency.He called with similar symptoms on December 23 and a 10 day course of Cipro was called in for him. He says it did not help. Denies nocturia. No occult blood in urine.No back pain. Similar symptoms in September treated with  Cipro with improvement but urine culture was negative.  Hisotry of  HTN, DM,obesity,PVCs, combined systolic and diastolic heart failure stable and under excellent control by Cardiologist, Dr. Garnette Scheuermann.  Hgb AIC in October was stable at 6.1%  PSA in October was 0.56.    Review of Systems no fever, chills, nausea, vomiting, hematuria, dysuria, or urinary hesitancy     Objective:   Physical Exam  BP 120/70 pulse 73 T. 97.7 pulse ox 98% Weight 269 lbs. BMI 38.60 Prostate is not enlarged and is not boggy.  No nodules appreciated.  Dipstick urine specimen reveals urine to be clear yellow without glucose or bilirubin.  No ketones.  Specific gravity 1.010.  Trace nonhemolyzed occult blood. No leukocytes or nitrite.  No odor.    Assessment & Plan:  Urinary frequency and suprapubic pressure  No nocturia  ?  BPH  Plan: Patient wants to see urologist and referral will be made.  I am starting him on Flomax 0.4 mg daily to see if symptoms improve.  Urine has been sent for microscopic evaluation and culture today.

## 2020-07-05 NOTE — Telephone Encounter (Signed)
Patient called would like to be referred to urology he is having pressure, and frequent urination. He said the antibiotic is not helping, he is waking up in the middle of the night with pressure and frequency. He would like something to get him through the weekend.

## 2020-07-05 NOTE — Telephone Encounter (Signed)
Scheduled

## 2020-07-05 NOTE — Patient Instructions (Signed)
Urine culture obtained as well as urine microscopic specimen.  Start Flomax 0.4 mg daily.  Urology referral will be made at patient request.  Recent PSA was normal in October.

## 2020-07-06 LAB — URINALYSIS, MICROSCOPIC ONLY
Bacteria, UA: NONE SEEN /HPF
Hyaline Cast: NONE SEEN /LPF
RBC / HPF: NONE SEEN /HPF (ref 0–2)
Squamous Epithelial / HPF: NONE SEEN /HPF (ref <=5)
WBC, UA: NONE SEEN /HPF (ref 0–5)

## 2020-07-06 LAB — URINE CULTURE
MICRO NUMBER:: 11366252
Result:: NO GROWTH
SPECIMEN QUALITY:: ADEQUATE

## 2020-07-17 ENCOUNTER — Other Ambulatory Visit: Payer: Self-pay | Admitting: Internal Medicine

## 2020-07-17 ENCOUNTER — Other Ambulatory Visit: Payer: Self-pay | Admitting: Interventional Cardiology

## 2020-07-17 ENCOUNTER — Telehealth: Payer: Self-pay | Admitting: Interventional Cardiology

## 2020-07-17 MED FILL — ZOLPIDEM TARTRATE 5 MG TAB: 5 | 90 days supply | Qty: 90 | Fill #1

## 2020-07-17 MED FILL — ALPRAZolam 0.5 MG TABS: 0.5 | 30 days supply | Qty: 60 | Fill #0

## 2020-07-17 NOTE — Telephone Encounter (Signed)
Was able to locate pt's paperwork but there is no income information.  Spoke with pt and he states that he now has this information.  He will make a copy and bring it to the office so everything can be submitted.

## 2020-07-17 NOTE — Telephone Encounter (Signed)
**Note De-Identified  Obfuscation** The pt states that he left a [employer] pt asst application at the office while at an office visit with Dr Tamala Julian on 06/21/2020 and that he later received a call from April advising him that [employer] needs his proof of income. He states that he has the info he thinks she needs.  I attempted to help the pt but I am unaware of his application so I cannot answer his questions. Per his request I am forwarding this phone note to April Garrison and Dr [facility] nurse Anderson Malta as he states that he has spoken with both of them concerning his application.

## 2020-07-17 NOTE — Telephone Encounter (Signed)
Patient called and stated that he needed to talk with Dr. Tamala Julian or nurse regarding his medication application for assistance for [facility]. Please call back

## 2020-07-18 ENCOUNTER — Other Ambulatory Visit: Payer: Self-pay | Admitting: Interventional Cardiology

## 2020-07-18 ENCOUNTER — Other Ambulatory Visit: Payer: Self-pay | Admitting: *Deleted

## 2020-07-18 MED FILL — CARVEDILOL 25 MG TABLET: 25 | 90 days supply | Qty: 180 | Fill #0

## 2020-07-18 NOTE — Telephone Encounter (Signed)
Income information dropped off by pt.  Added to application and faxed everything together.  Application and paperwork placed with PA papers in Lynn's office.

## 2020-07-21 ENCOUNTER — Other Ambulatory Visit (HOSPITAL_COMMUNITY): Payer: Self-pay | Admitting: Urology

## 2020-07-21 DIAGNOSIS — R350 Frequency of micturition: Secondary | ICD-10-CM | POA: Diagnosis not present

## 2020-07-21 MED FILL — SILDENAFIL CITRATE 100 MG T: 100 | 30 days supply | Qty: 15 | Fill #0

## 2020-07-25 NOTE — Telephone Encounter (Signed)
Patient calling to make sure [name] [name] received the paper work dropped off. He states he would also like to know if it has been sent off or if there is more information needed.

## 2020-07-26 NOTE — Telephone Encounter (Signed)
**Note De-Identified  Obfuscation** The pt is advised that Dr Darliss Ridgel nurse faxed his [employer] pt asst application with all documents he submitted to [employer] on 07/18/2020. I gave him [employer] pt asst phone number so he can call to check the progress of his application. He thanked me for calling him back and states he will call them today.

## 2020-08-15 ENCOUNTER — Other Ambulatory Visit: Payer: Self-pay | Admitting: Internal Medicine

## 2020-08-15 MED FILL — ATORVASTATIN CALCIUM 10 MG: 10 | 90 days supply | Qty: 90 | Fill #0

## 2020-08-15 MED FILL — METFORMIN HCL ER 500 MG TB2: 500 | 90 days supply | Qty: 180 | Fill #1

## 2020-08-15 MED FILL — ALPRAZolam 0.5 MG TABS: 0.5 | 30 days supply | Qty: 60 | Fill #1

## 2020-08-15 MED FILL — AMLODIPINE BESYLATE 10 MG T: 10 | 90 days supply | Qty: 90 | Fill #3

## 2020-08-15 MED FILL — FUROSEMIDE 40 MG TAB: 40 | 90 days supply | Qty: 90 | Fill #2

## 2020-08-21 ENCOUNTER — Other Ambulatory Visit: Payer: Self-pay | Admitting: Orthopaedic Surgery

## 2020-08-21 ENCOUNTER — Telehealth: Payer: Self-pay | Admitting: Interventional Cardiology

## 2020-08-21 ENCOUNTER — Telehealth: Payer: Self-pay | Admitting: Internal Medicine

## 2020-08-21 DIAGNOSIS — M25561 Pain in right knee: Secondary | ICD-10-CM | POA: Diagnosis not present

## 2020-08-21 DIAGNOSIS — Z01818 Encounter for other preprocedural examination: Secondary | ICD-10-CM

## 2020-08-21 DIAGNOSIS — Z96659 Presence of unspecified artificial knee joint: Secondary | ICD-10-CM | POA: Diagnosis not present

## 2020-08-21 NOTE — Telephone Encounter (Signed)
    [facility] Pre-operative Risk Assessment    HEARTCARE STAFF: - Please ensure there is not already an duplicate clearance open for this procedure. - Under Visit Info/Reason for Call, type in Other and utilize the format Clearance MM/DD/YY or Clearance TBD. Do not use dashes or single digits. - If request is for dental extraction, please clarify the # of teeth to be extracted.  Request for surgical clearance:  1. What type of surgery is being performed?   RIGHT TOTAL KNEE ARTHROPLASTY  2. When is this surgery scheduled? 09/05/20   3. What type of clearance is required (medical clearance vs. Pharmacy clearance to hold med vs. Both)? Both   4. Are there any medications that need to be held prior to surgery and how long? Aspirin , pt told them that he does not take the regularly   5. Practice name and name of physician performing surgery? [facility]   6. What is the office phone number? [phone]   7.   What is the office fax number? [phone]  8.   Anesthesia type (None, local, MAC, general) ? Spinal    Edwena Blow A Stovall 08/21/2020, 2:26 PM  _________________________________________________________________   (provider comments below)

## 2020-08-21 NOTE — Telephone Encounter (Signed)
Faxed back signed surgery clearance form saying that patient will need clearance from cardiologist Dr Pernell Dupre.

## 2020-08-22 NOTE — Telephone Encounter (Signed)
Notes faxed to surgeon. This phone note will be removed from the preop pool. Richardson Dopp, PA-C  08/22/2020 1:52 PM

## 2020-08-22 NOTE — Telephone Encounter (Signed)
   Primary Cardiologist: Sinclair Grooms, MD  Chart reviewed as part of pre-operative protocol coverage.   Hx:  CAD, HFimpEF (EF 35>>60% on GDMT), DM2, HTN, aortic atherosclerosis, obesity. Echo 4/21: EF 55-60, trivial MR, AV sclerosis without stenosis Cath: LAD proximal 25%  Last seen by Dr. Tamala Julian:  06/21/20  RCRI:  Perioperative Risk of Major Cardiac Event is (%): 0.9 (low risk) DASI:  Functional Capacity in METs is: 5.62 (functional status is good )  Patient was contacted 08/22/2020 in reference to pre-operative risk assessment for pending surgery as outlined below.    Since last seen, KIEGAN MACARAEG has done well without chest pain or shortness of breath.  Recommendations:  Therefore, based on ACC/AHA guidelines, the patient would be at acceptable risk for the planned procedure without further cardiovascular testing.   He may hold aspirin for 7 days prior to surgery, if needed.  Aspirin should be resumed postoperatively when it is felt to be safe by the surgeon.   Please call with questions. Richardson Dopp, PA-C 08/22/2020, 1:50 PM

## 2020-08-24 NOTE — Patient Instructions (Signed)
DUE TO COVID-19 ONLY ONE VISITOR IS ALLOWED TO COME WITH YOU AND STAY IN THE WAITING ROOM ONLY DURING PRE OP AND PROCEDURE DAY OF SURGERY. THE 1 VISITOR  MAY VISIT WITH YOU AFTER SURGERY IN YOUR PRIVATE ROOM DURING VISITING HOURS ONLY!  YOU NEED TO HAVE A COVID 19 TEST ON__2-25-22_____ @_______ , THIS TEST MUST BE DONE BEFORE SURGERY,  COVID TESTING SITE [address] [place] [address], IT IS ON THE RIGHT GOING OUT [address] APPROXIMATELY  2 MINUTES PAST [employer] ON THE RIGHT. ONCE YOUR COVID TEST IS COMPLETED,  PLEASE BEGIN THE QUARANTINE INSTRUCTIONS AS OUTLINED IN YOUR HANDOUT.                [name] [name]  08/24/2020   Your procedure is scheduled on:    09-05-20   Report to [facility] Main  Entrance              Report to admitting at                [place]  AM     Call this number if you have problems the morning of surgery [phone]    Remember: NO SOLID FOOD AFTER MIDNIGHT THE NIGHT PRIOR TO SURGERY. NOTHING BY MOUTH EXCEPT CLEAR LIQUIDS UNTIL       0900 am then nothing by mouth .     CLEAR LIQUID DIET   Foods Allowed                                                               Coffee and tea, regular and decaf                             Plain Jell-O any favor except red or purple                                     Fruit ices (not with fruit pulp)                                       Iced Popsicles                                    Carbonated beverages, regular and diet                                    Cranberry, grape and apple juices Sports drinks like Gatorade Lightly seasoned clear broth or consume(fat free) Sugar, honey syrup   _____________________________________________________________________   BRUSH YOUR TEETH MORNING OF SURGERY AND RINSE YOUR MOUTH OUT, NO CHEWING GUM CANDY OR MINTS.     Take these medicines the morning of surgery with A SIP OF WATER: nexium, carvedilol, lipitor, amlodipine, xanax if  needed  DO NOT TAKE ANY DIABETIC MEDICATIONS DAY OF YOUR SURGERY  You may not have any metal on your body including hair pins and              piercings  Do not wear jewelry,  lotions, powders or perfumes, deodorant                      Men may shave face and neck.   Do not bring valuables to the hospital. [place].  Contacts, dentures or bridgework may not be worn into surgery. .     Patients discharged the day of surgery will not be allowed to drive home. IF YOU ARE HAVING SURGERY AND GOING HOME THE SAME DAY, YOU MUST HAVE AN ADULT TO DRIVE YOU HOME AND BE WITH YOU FOR 24 HOURS. YOU MAY GO HOME BY TAXI OR UBER OR ORTHERWISE, BUT AN ADULT MUST ACCOMPANY YOU HOME AND STAY WITH YOU FOR 24 HOURS.  Name and phone number of your driver:  Special Instructions: N/A              Please read over the following fact sheets you were given: _____________________________________________________________________             [facility] - Preparing for Surgery Before surgery, you can play an important role.  Because skin is not sterile, your skin needs to be as free of germs as possible.  You can reduce the number of germs on your skin by washing with CHG (chlorahexidine gluconate) soap before surgery.  CHG is an antiseptic cleaner which kills germs and bonds with the skin to continue killing germs even after washing. Please DO NOT use if you have an allergy to CHG or antibacterial soaps.  If your skin becomes reddened/irritated stop using the CHG and inform your nurse when you arrive at Short Stay. Do not shave (including legs and underarms) for at least 48 hours prior to the first CHG shower.  You may shave your face/neck. Please follow these instructions carefully:  1.  Shower with CHG Soap the night before surgery and the  morning of Surgery.  2.  If you choose to wash your hair, wash your hair first as usual with your   normal  shampoo.  3.  After you shampoo, rinse your hair and body thoroughly to remove the  shampoo.                           4.  Use CHG as you would any other liquid soap.  You can apply chg directly  to the skin and wash                       Gently with a scrungie or clean washcloth.  5.  Apply the CHG Soap to your body ONLY FROM THE NECK DOWN.   Do not use on face/ open                           Wound or open sores. Avoid contact with eyes, ears mouth and genitals (private parts).                       Wash face,  Genitals (private parts) with your normal soap.  6.  Wash thoroughly, paying special attention to the area where your surgery  will be performed.  7.  Thoroughly rinse your body with warm water from the neck down.  8.  DO NOT shower/wash with your normal soap after using and rinsing off  the CHG Soap.                9.  Pat yourself dry with a clean towel.            10.  Wear clean pajamas.            11.  Place clean sheets on your bed the night of your first shower and do not  sleep with pets. Day of Surgery : Do not apply any lotions/deodorants the morning of surgery.  Please wear clean clothes to the hospital/surgery center.  FAILURE TO FOLLOW THESE INSTRUCTIONS MAY RESULT IN THE CANCELLATION OF YOUR SURGERY PATIENT SIGNATURE_________________________________  NURSE SIGNATURE__________________________________  ________________________________________________________________________   [name] [name]  An incentive spirometer is a tool that can help keep your lungs clear and active. This tool measures how well you are filling your lungs with each breath. Taking long deep breaths may help reverse or decrease the chance of developing breathing (pulmonary) problems (especially infection) following:  A long period of time when you are unable to move or be active. BEFORE THE PROCEDURE   If the spirometer includes an indicator to show your best effort, your  nurse or respiratory therapist will set it to a desired goal.  If possible, sit up straight or lean slightly forward. Try not to slouch.  Hold the incentive spirometer in an upright position. INSTRUCTIONS FOR USE  1. Sit on the edge of your bed if possible, or sit up as far as you can in bed or on a chair. 2. Hold the incentive spirometer in an upright position. 3. Breathe out normally. 4. Place the mouthpiece in your mouth and seal your lips tightly around it. 5. Breathe in slowly and as deeply as possible, raising the piston or the ball toward the top of the column. 6. Hold your breath for 3-5 seconds or for as long as possible. Allow the piston or ball to fall to the bottom of the column. 7. Remove the mouthpiece from your mouth and breathe out normally. 8. Rest for a few seconds and repeat Steps 1 through 7 at least 10 times every 1-2 hours when you are awake. Take your time and take a few normal breaths between deep breaths. 9. The spirometer may include an indicator to show your best effort. Use the indicator as a goal to work toward during each repetition. 10. After each set of 10 deep breaths, practice coughing to be sure your lungs are clear. If you have an incision (the cut made at the time of surgery), support your incision when coughing by placing a pillow or rolled up towels firmly against it. Once you are able to get out of bed, walk around indoors and cough well. You may stop using the incentive spirometer when instructed by your caregiver.  RISKS AND COMPLICATIONS  Take your time so you do not get dizzy or light-headed.  If you are in pain, you may need to take or ask for pain medication before doing incentive spirometry. It is harder to take a deep breath if you are having pain. AFTER USE  Rest and breathe slowly and easily.  It can be helpful to keep track of a log of your  progress. Your caregiver can provide you with a simple table to help with this. If you are using the  spirometer at home, follow these instructions: [place] IF:   You are having difficultly using the spirometer.  You have trouble using the spirometer as often as instructed.  Your pain medication is not giving enough relief while using the spirometer.  You develop fever of 100.5 F (38.1 C) or higher. SEEK IMMEDIATE MEDICAL CARE IF:   You cough up bloody sputum that had not been present before.  You develop fever of 102 F (38.9 C) or greater.  You develop worsening pain at or near the incision site. MAKE SURE YOU:   Understand these instructions.  Will watch your condition.  Will get help right away if you are not doing well or get worse. Document Released: 11/04/2006 Document Revised: 09/16/2011 Document Reviewed: 01/05/2007 [facility] Patient Information 2014 [place], [place].   ________________________________________________________________________

## 2020-08-24 NOTE — Progress Notes (Addendum)
PCP - Dr. Karle Starch [facility]  Cardiologist - [facility] Dr. Daneen Schick 06-21-20 Clearance 08-22-20 epic  PPM/ICD -  Device Orders -  Rep Notified -   Chest x-ray -  EKG - 01-05-20 epic Stress Test -  ECHO - 10-25-19 epic Cardiac Cath - 2020  Sleep Study -  CPAP -   Fasting Blood Sugar - 105-110 Checks Blood Sugar ___1__ times a day  Blood Thinner Instructions: Aspirin Instructions: 81 mg asa hold 7 days prior  ERAS Protcol - PRE-SURGERY Ensure or G2-   COVID TEST- 09-01-20  Activity- Able to walk a flight of stairs without sob works in garden   Anesthesia review: CHF , CAD, HTN  Patient denies shortness of breath, fever, cough and chest pain at PAT appointment   NONE   All instructions explained to the patient, with a verbal understanding of the material. Patient agrees to go over the instructions while at home for a better understanding. Patient also instructed to self quarantine after being tested for COVID-19. The opportunity to ask questions was provided.

## 2020-08-25 NOTE — Telephone Encounter (Signed)
**Note De-Identified Lynnmarie Lovett Obfuscation** Letter received Carlise Stofer fax from [employer] Pt asst stating that they denied the pt for asst with his [employer]. Reason: Drug coverage exists.  The letter states that they have notified the pt of this denial as well.

## 2020-08-29 NOTE — Care Plan (Signed)
Ortho Bundle Case Management Note  Patient Details  Name: [name] [name] MRN: [id number] Date of Birth: 02/02/1951  Spoke with patient prior to surgery. He will discharge to home with family. Had equipment. Requested to go straight to OPPT. Arranged at [facility]. Patient and MD in agreement with plan. Choice offered                     DME Arranged:    DME Agency:     HH Arranged:    HH Agency:     Additional Comments: Please contact me with any questions of if this plan should need to change.  Ladell Heads,  [place] [facility]  [phone] 08/29/2020, 10:03 AM

## 2020-08-30 ENCOUNTER — Encounter (HOSPITAL_COMMUNITY)
Admission: RE | Admit: 2020-08-30 | Discharge: 2020-08-30 | Disposition: A | Discharge status: Home / Self Care | Payer: Medicare Other | Source: Ambulatory Visit | Attending: Orthopaedic Surgery | Admitting: Orthopaedic Surgery

## 2020-08-30 ENCOUNTER — Encounter (HOSPITAL_COMMUNITY): Payer: Self-pay

## 2020-08-30 ENCOUNTER — Ambulatory Visit (HOSPITAL_COMMUNITY)
Admission: RE | Admit: 2020-08-30 | Discharge: 2020-08-30 | Disposition: A | Discharge status: Home / Self Care | Payer: Medicare Other | Source: Ambulatory Visit | Attending: Orthopaedic Surgery | Admitting: Orthopaedic Surgery

## 2020-08-30 ENCOUNTER — Other Ambulatory Visit: Payer: Self-pay

## 2020-08-30 DIAGNOSIS — Z01818 Encounter for other preprocedural examination: Secondary | ICD-10-CM | POA: Insufficient documentation

## 2020-08-30 HISTORY — DX: Bradycardia, unspecified: R00.1

## 2020-08-30 HISTORY — DX: Anxiety disorder, unspecified: F41.9

## 2020-08-30 LAB — CBC WITH DIFFERENTIAL/PLATELET
Abs Immature Granulocytes: 0.01 10*3/uL (ref 0.00–0.07)
Basophils Absolute: 0 10*3/uL (ref 0.0–0.1)
Basophils Relative: 1 %
Eosinophils Absolute: 0.1 10*3/uL (ref 0.0–0.5)
Eosinophils Relative: 2 %
HCT: 41.3 % (ref 39.0–52.0)
Hemoglobin: 13.6 g/dL (ref 13.0–17.0)
Immature Granulocytes: 0 %
Lymphocytes Relative: 44 %
Lymphs Abs: 1.9 10*3/uL (ref 0.7–4.0)
MCH: 31.6 pg (ref 26.0–34.0)
MCHC: 32.9 g/dL (ref 30.0–36.0)
MCV: 96 fL (ref 80.0–100.0)
Monocytes Absolute: 0.6 10*3/uL (ref 0.1–1.0)
Monocytes Relative: 14 %
Neutro Abs: 1.7 10*3/uL (ref 1.7–7.7)
Neutrophils Relative %: 39 %
Platelets: 239 10*3/uL (ref 150–400)
RBC: 4.3 MIL/uL (ref 4.22–5.81)
RDW: 12.8 % (ref 11.5–15.5)
WBC: 4.3 10*3/uL (ref 4.0–10.5)
nRBC: 0 % (ref 0.0–0.2)

## 2020-08-30 LAB — BASIC METABOLIC PANEL
Anion gap: 11 (ref 5–15)
BUN: 16 mg/dL (ref 8–23)
CO2: 24 mmol/L (ref 22–32)
Calcium: 9.8 mg/dL (ref 8.9–10.3)
Chloride: 104 mmol/L (ref 98–111)
Creatinine, Ser: 1.05 mg/dL (ref 0.61–1.24)
GFR, Estimated: >60 mL/min (ref >60)
Glucose, Bld: 148 mg/dL — ABNORMAL HIGH (ref 70–99)
Potassium: 4 mmol/L (ref 3.5–5.1)
Sodium: 139 mmol/L (ref 135–145)

## 2020-08-30 LAB — URINALYSIS, ROUTINE W REFLEX MICROSCOPIC
Bacteria, UA: NONE SEEN
Bilirubin Urine: NEGATIVE
Glucose, UA: NEGATIVE mg/dL
Ketones, ur: NEGATIVE mg/dL
Leukocytes,Ua: NEGATIVE
Nitrite: NEGATIVE
Protein, ur: NEGATIVE mg/dL
Specific Gravity, Urine: 1.01 (ref 1.005–1.030)
pH: 5 (ref 5.0–8.0)

## 2020-08-30 LAB — SURGICAL PCR SCREEN
MRSA, PCR: NEGATIVE
Staphylococcus aureus: NEGATIVE

## 2020-08-30 LAB — HEMOGLOBIN A1C
Hgb A1c MFr Bld: 6.1 % — ABNORMAL HIGH (ref 4.8–5.6)
Mean Plasma Glucose: 128.37 mg/dL

## 2020-08-30 LAB — PROTIME-INR
INR: 1 (ref 0.8–1.2)
Prothrombin Time: 12.8 seconds (ref 11.4–15.2)

## 2020-08-30 LAB — GLUCOSE, CAPILLARY: Glucose-Capillary: 170 mg/dL — ABNORMAL HIGH (ref 70–99)

## 2020-08-30 LAB — APTT: aPTT: 29 seconds (ref 24–36)

## 2020-09-02 ENCOUNTER — Other Ambulatory Visit (HOSPITAL_COMMUNITY)
Admission: RE | Admit: 2020-09-02 | Discharge: 2020-09-02 | Disposition: A | Discharge status: Home / Self Care | Payer: Medicare Other | Source: Ambulatory Visit | Attending: Orthopaedic Surgery | Admitting: Orthopaedic Surgery

## 2020-09-02 DIAGNOSIS — Z01812 Encounter for preprocedural laboratory examination: Secondary | ICD-10-CM | POA: Diagnosis not present

## 2020-09-02 DIAGNOSIS — Z20822 Contact with and (suspected) exposure to covid-19: Secondary | ICD-10-CM | POA: Diagnosis not present

## 2020-09-02 LAB — SARS CORONAVIRUS 2 (TAT 6-24 HRS): SARS Coronavirus 2: NEGATIVE

## 2020-09-04 ENCOUNTER — Telehealth: Payer: Self-pay | Admitting: Interventional Cardiology

## 2020-09-04 MED ORDER — DEXTROSE 5 % IV SOLN
3.0000 g | INTRAVENOUS | Status: AC
  Administered 2020-09-05: 3 g via INTRAVENOUS
  Filled 2020-09-04: qty 3

## 2020-09-04 MED ORDER — BUPIVACAINE LIPOSOME 1.3 % IJ SUSP
20.0000 mL | Freq: Once | INTRAMUSCULAR | Status: DC
  Filled 2020-09-04: qty 20

## 2020-09-04 MED ORDER — TRANEXAMIC ACID 1000 MG/10ML IV SOLN
2000.0000 mg | INTRAVENOUS | Status: DC
  Filled 2020-09-04: qty 20

## 2020-09-04 NOTE — Telephone Encounter (Signed)
Spoke with pt and he states that he was denied for pt assistance for [employer].  Pt states everything is the same as last year when he was approved, other than the small "raise" that people who get social security received last year.  Advised I will send to [employer] to see if there is anything else we can do to try to get this approved, maybe appeal?  Pt states he is having knee surgery tomorrow, so if called and doesn't answer, just leave a detailed message and he will call back as soon as he can.

## 2020-09-04 NOTE — Telephone Encounter (Signed)
Patient had a question and wanted to speak with Anderson Malta, Dr. Thompson Caul Nurse directly. Please call back

## 2020-09-04 NOTE — H&P (Signed)
TOTAL KNEE ADMISSION H&P  Patient is being admitted for right total knee arthroplasty.  Subjective:  Chief Complaint:right knee pain.  HPI: [name] [name], 70 y.o. male, has a history of pain and functional disability in the right knee due to arthritis and has failed non-surgical conservative treatments for greater than 12 weeks to includeNSAID's and/or analgesics, corticosteriod injections, viscosupplementation injections, flexibility and strengthening excercises, supervised PT with diminished ADL's post treatment, use of assistive devices, weight reduction as appropriate and activity modification.  Onset of symptoms was gradual, starting 5 years ago with gradually worsening course since that time. The patient noted no past surgery on the right knee(s).  Patient currently rates pain in the right knee(s) at 10 out of 10 with activity. Patient has night pain, worsening of pain with activity and weight bearing, pain that interferes with activities of daily living, crepitus and joint swelling.  Patient has evidence of subchondral cysts, subchondral sclerosis, periarticular osteophytes and joint space narrowing by imaging studies. There is no active infection.  Patient Active Problem List   Diagnosis Date Noted  . Primary osteoarthritis of left knee 07/28/2018  . Chronic systolic heart failure ([place]) 07/15/2018  . Abdominal aortic atherosclerosis ([place]) 01/14/2018  . PVC's (premature ventricular contractions) 10/05/2017  . Hyperlipidemia associated with type 2 diabetes mellitus ([place]) 10/05/2017  . BMI 40.0-44.9, adult ([place]) 10/05/2017  . History of gout 07/04/2013  . Low serum testosterone level 07/04/2013  . GE reflux 07/06/2012  . Type 2 diabetes mellitus with complication, without long-term current use of insulin ([place]) 03/09/2012  . Osteoarthritis of both knees 03/09/2012  . Obesity 03/09/2012  . Erectile dysfunction 03/02/2012  . Hypertension 03/02/2012   Past Medical History:  Diagnosis  Date  . Anxiety   . Arthritis   . Bradycardia    hx of  in 30's medications changed  . Diabetes mellitus without complication ([place])   . DJD (degenerative joint disease) of knee    Left  . Hx of adenomatous colonic polyps 12/2019   2 diminutive  . Hypertension     Past Surgical History:  Procedure Laterality Date  . CARDIAC CATHETERIZATION  07/23/2018  . CHOLECYSTECTOMY N/A 02/04/2016   Procedure: LAPAROSCOPIC CHOLECYSTECTOMY;  Surgeon: Leighton Ruff, MD;  Location: [facility];  Service: General;  Laterality: N/A;  . COLONOSCOPY  12/13/2019  . ERCP N/A 02/02/2016   Procedure: ENDOSCOPIC RETROGRADE CHOLANGIOPANCREATOGRAPHY (ERCP);  Surgeon: [name], MD;  Location: [facility];  Service: Endoscopy;  Laterality: N/A;  . HERNIA REPAIR    . RIGHT/LEFT HEART CATH AND CORONARY ANGIOGRAPHY N/A 07/23/2018   Procedure: RIGHT/LEFT HEART CATH AND CORONARY ANGIOGRAPHY;  Surgeon: Belva Crome, MD;  Location: [facility];  Service: Cardiovascular;  Laterality: N/A;  . SPINE SURGERY    . TOTAL KNEE ARTHROPLASTY Left 07/28/2018   Procedure: TOTAL KNEE ARTHROPLASTY;  Surgeon: Melrose Nakayama, MD;  Location: [facility];  Service: Orthopedics;  Laterality: Left;    Current Facility-Administered Medications  Medication Dose Route Frequency Provider Last Rate Last Admin  . [START ON 09/05/2020] bupivacaine liposome (EXPAREL) 1.3 % injection 266 mg  20 mL Other Once Melrose Nakayama, MD      . Derrill Memo ON 09/05/2020] ceFAZolin (ANCEF) 3 g in dextrose 5 % 50 mL IVPB  3 g Intravenous On Call to OR Melrose Nakayama, MD      . Derrill Memo ON 09/05/2020] tranexamic acid (CYKLOKAPRON) 2,000 mg in sodium chloride 0.9 % 50 mL Topical Application  3,557 mg Topical To  OR Melrose Nakayama, MD       Current Outpatient Medications  Medication Sig Dispense Refill Last Dose  . ALPRAZolam (XANAX) 0.5 MG tablet TAKE 1 TABLET BY MOUTH TWICE A DAY AS NEEDED FOR ANXIETY (Patient taking differently: Take 0.5 mg by mouth 2 (two) times  daily as needed for anxiety.) 60 tablet 5   . amLODipine (NORVASC) 10 MG tablet TAKE 1 TABLET BY MOUTH DAILY. (Patient taking differently: Take 10 mg by mouth daily.) 90 tablet 3   . aspirin EC 81 MG tablet Take 1 tablet (81 mg total) by mouth daily. 90 tablet 3   . atorvastatin (LIPITOR) 10 MG tablet TAKE 1 TABLET BY MOUTH DAILY. (Patient taking differently: Take 10 mg by mouth daily.) 90 tablet 3   . bismuth subsalicylate (PEPTO BISMOL) 262 MG/15ML suspension Take 30 mLs by mouth every 6 (six) hours as needed for indigestion.     . carvedilol (COREG) 25 MG tablet TAKE 1 TABLET BY MOUTH 2 TIMES DAILY. (Patient taking differently: Take 25 mg by mouth 2 (two) times daily.) 180 tablet 2   . esomeprazole (NEXIUM) 40 MG capsule Take 1 capsule (40 mg total) by mouth daily. 90 capsule 1   . furosemide (LASIX) 40 MG tablet TAKE 1 TABLET BY MOUTH DAILY (Patient taking differently: Take 40 mg by mouth daily.) 90 tablet 3   . meloxicam (MOBIC) 15 MG tablet TAKE 1 TABLET BY MOUTH ONCE DAILY (Patient taking differently: Take 15 mg by mouth daily as needed for pain.) 90 tablet 3   . metFORMIN (GLUCOPHAGE-XR) 500 MG 24 hr tablet TAKE 1 TABLET BY MOUTH TWICE A DAY (Patient taking differently: Take 500 mg by mouth 2 (two) times daily.) 180 tablet 3   . sacubitril-valsartan (ENTRESTO) 97-103 MG Take 1 tablet by mouth 2 (two) times daily. 180 tablet 3   . sildenafil (VIAGRA) 100 MG tablet Take 50 mg by mouth daily as needed for erectile dysfunction.     [name] spironolactone (ALDACTONE) 25 MG tablet TAKE 1 TABLET (25 MG TOTAL) BY MOUTH 2 TIMES DAILY. (Patient taking differently: Take 25 mg by mouth 2 (two) times daily.) 180 tablet 1   . zolpidem (AMBIEN) 5 MG tablet TAKE 1 TABLET (5 MG TOTAL) BY MOUTH AT BEDTIME AS NEEDED FOR SLEEP. 90 tablet 1   . ciprofloxacin (CIPRO) 500 MG tablet Take 1 tablet (500 mg total) by mouth 2 (two) times daily. (Patient not taking: No sig reported) 20 tablet 0 Completed Course at Unknown time   . tamsulosin (FLOMAX) 0.4 MG CAPS capsule Take 1 capsule (0.4 mg total) by mouth daily. (Patient not taking: No sig reported) 30 capsule 0 Not Taking at Unknown time   No Known Allergies  Social History   Tobacco Use  . Smoking status: Never Smoker  . Smokeless tobacco: Never Used  Substance Use Topics  . Alcohol use: No    Family History  Problem Relation Age of Onset  . Stroke Mother   . Hypertension Mother   . Colon cancer Neg Hx   . Stomach cancer Neg Hx   . Rectal cancer Neg Hx   . Pancreatic cancer Neg Hx      Review of Systems  Musculoskeletal: Positive for arthralgias.       Right knee  All other systems reviewed and are negative.   Objective:  Physical Exam Constitutional:      Appearance: Normal appearance.  HENT:     Head: Normocephalic and atraumatic.  Mouth/Throat:     Pharynx: Oropharynx is clear.  Eyes:     Extraocular Movements: Extraocular movements intact.  Cardiovascular:     Rate and Rhythm: Normal rate and regular rhythm.     Pulses: Normal pulses.  Pulmonary:     Effort: Pulmonary effort is normal.  Abdominal:     Palpations: Abdomen is soft.  Musculoskeletal:     Cervical back: Normal range of motion.     Comments: Right knee motion is about 0-100.  He has crepitation and medial joint line pain but no effusion.  I do not see any scars.  He is walking with an altered gait.  His replaced opposite knee goes about 0-120 with good stability in extension.   Skin:    General: Skin is warm and dry.  Neurological:     General: No focal deficit present.     Mental Status: He is alert and oriented to person, place, and time. Mental status is at baseline.  Psychiatric:        Mood and Affect: Mood normal.        Behavior: Behavior normal.        Thought Content: Thought content normal.        Judgment: Judgment normal.     Vital signs in last 24 hours:    Labs:   Estimated body mass index is 39.17 kg/m as calculated from the  following:   Height as of 08/30/20: 5\' 10"  (1.778 m).   Weight as of 08/30/20: 123.8 kg.   Imaging Review Plain radiographs demonstrate severe degenerative joint disease of the right knee(s). The overall alignment isneutral. The bone quality appears to be good for age and reported activity level.      Assessment/Plan:  End stage primary arthritis, right knee   The patient history, physical examination, clinical judgment of the provider and imaging studies are consistent with end stage degenerative joint disease of the right knee(s) and total knee arthroplasty is deemed medically necessary. The treatment options including medical management, injection therapy arthroscopy and arthroplasty were discussed at length. The risks and benefits of total knee arthroplasty were presented and reviewed. The risks due to aseptic loosening, infection, stiffness, patella tracking problems, thromboembolic complications and other imponderables were discussed. The patient acknowledged the explanation, agreed to proceed with the plan and consent was signed. Patient is being admitted for inpatient treatment for surgery, pain control, PT, OT, prophylactic antibiotics, VTE prophylaxis, progressive ambulation and ADL's and discharge planning. The patient is planning to be discharged home with home health services  Patient's anticipated LOS is less than 2 midnights, meeting these requirements: - Younger than 41 - Lives within 1 hour of care - Has a competent adult at home to recover with post-op recover - NO history of  - Chronic pain requiring opiods  - Diabetes  - Coronary Artery Disease  - Heart failure  - Heart attack  - Stroke  - DVT/VTE  - Cardiac arrhythmia  - Respiratory Failure/COPD  - Renal failure  - Anemia  - Advanced Liver disease

## 2020-09-04 NOTE — Telephone Encounter (Signed)
**Note De-Identified  Obfuscation** The pt is advised that he was approved for assistance for his Delene Loll through [employer] when he fell into the "donut hole" last year and that he may owe a deductible now since it is a new year. He has not contacted his pharmacy to check the cost for his Delene Loll yet this year.   He wanted to know if there is a medication that can replace Entresto that is less expensive. I advised him that Delene Loll is made up of 2 different medications and can be broken down to the generic meds that make it.  He states that he has about a months worth of Entresto on hand and when he gets down to a weeks worth he will call his pharmacy to see what his cost for entresto will be and if he cannot afford he will call us back.  He thanked me for calling him to discuss.

## 2020-09-05 ENCOUNTER — Encounter (HOSPITAL_COMMUNITY): Payer: Self-pay | Admitting: Orthopaedic Surgery

## 2020-09-05 ENCOUNTER — Ambulatory Visit (HOSPITAL_COMMUNITY): Payer: Medicare Other | Admitting: Anesthesiology

## 2020-09-05 ENCOUNTER — Encounter (HOSPITAL_COMMUNITY)
Admission: RE | Disposition: A | Discharge status: Home / Self Care | Payer: Self-pay | Source: Home / Self Care | Attending: Orthopaedic Surgery

## 2020-09-05 ENCOUNTER — Other Ambulatory Visit (HOSPITAL_COMMUNITY): Payer: Self-pay | Admitting: Orthopedic Surgery

## 2020-09-05 ENCOUNTER — Ambulatory Visit (HOSPITAL_COMMUNITY)
Admission: RE | Admit: 2020-09-05 | Discharge: 2020-09-05 | Disposition: A | Discharge status: Home / Self Care | Payer: Medicare Other | Attending: Orthopaedic Surgery | Admitting: Orthopaedic Surgery

## 2020-09-05 DIAGNOSIS — I2510 Atherosclerotic heart disease of native coronary artery without angina pectoris: Secondary | ICD-10-CM | POA: Diagnosis not present

## 2020-09-05 DIAGNOSIS — M170 Bilateral primary osteoarthritis of knee: Secondary | ICD-10-CM | POA: Diagnosis not present

## 2020-09-05 DIAGNOSIS — Z791 Long term (current) use of non-steroidal anti-inflammatories (NSAID): Secondary | ICD-10-CM | POA: Diagnosis not present

## 2020-09-05 DIAGNOSIS — Z96652 Presence of left artificial knee joint: Secondary | ICD-10-CM | POA: Diagnosis not present

## 2020-09-05 DIAGNOSIS — Z6841 Body Mass Index (BMI) 40.0 and over, adult: Secondary | ICD-10-CM | POA: Diagnosis not present

## 2020-09-05 DIAGNOSIS — Z86010 Personal history of colonic polyps: Secondary | ICD-10-CM | POA: Diagnosis not present

## 2020-09-05 DIAGNOSIS — M6281 Muscle weakness (generalized): Secondary | ICD-10-CM | POA: Insufficient documentation

## 2020-09-05 DIAGNOSIS — Z9049 Acquired absence of other specified parts of digestive tract: Secondary | ICD-10-CM | POA: Insufficient documentation

## 2020-09-05 DIAGNOSIS — I5022 Chronic systolic (congestive) heart failure: Secondary | ICD-10-CM | POA: Insufficient documentation

## 2020-09-05 DIAGNOSIS — G8918 Other acute postprocedural pain: Secondary | ICD-10-CM | POA: Diagnosis not present

## 2020-09-05 DIAGNOSIS — Z7982 Long term (current) use of aspirin: Secondary | ICD-10-CM | POA: Insufficient documentation

## 2020-09-05 DIAGNOSIS — E118 Type 2 diabetes mellitus with unspecified complications: Secondary | ICD-10-CM | POA: Insufficient documentation

## 2020-09-05 DIAGNOSIS — E669 Obesity, unspecified: Secondary | ICD-10-CM | POA: Insufficient documentation

## 2020-09-05 DIAGNOSIS — R2681 Unsteadiness on feet: Secondary | ICD-10-CM | POA: Diagnosis not present

## 2020-09-05 DIAGNOSIS — M1711 Unilateral primary osteoarthritis, right knee: Secondary | ICD-10-CM | POA: Diagnosis present

## 2020-09-05 DIAGNOSIS — Z7984 Long term (current) use of oral hypoglycemic drugs: Secondary | ICD-10-CM | POA: Diagnosis not present

## 2020-09-05 DIAGNOSIS — E785 Hyperlipidemia, unspecified: Secondary | ICD-10-CM | POA: Insufficient documentation

## 2020-09-05 DIAGNOSIS — Z79899 Other long term (current) drug therapy: Secondary | ICD-10-CM | POA: Insufficient documentation

## 2020-09-05 DIAGNOSIS — Z8249 Family history of ischemic heart disease and other diseases of the circulatory system: Secondary | ICD-10-CM | POA: Diagnosis not present

## 2020-09-05 DIAGNOSIS — I110 Hypertensive heart disease with heart failure: Secondary | ICD-10-CM | POA: Diagnosis not present

## 2020-09-05 HISTORY — PX: TOTAL KNEE ARTHROPLASTY: SHX125

## 2020-09-05 LAB — TYPE AND SCREEN
ABO/RH(D): O POS
Antibody Screen: NEGATIVE

## 2020-09-05 LAB — GLUCOSE, CAPILLARY
Glucose-Capillary: 126 mg/dL — ABNORMAL HIGH (ref 70–99)
Glucose-Capillary: 88 mg/dL (ref 70–99)

## 2020-09-05 SURGERY — ARTHROPLASTY, KNEE, TOTAL
Anesthesia: Spinal | Site: Knee | Laterality: Right

## 2020-09-05 MED ORDER — ACETAMINOPHEN 160 MG/5ML PO SOLN: 325.0000 mg | Freq: Once | ORAL | Status: DC | PRN

## 2020-09-05 MED ORDER — TIZANIDINE HCL 4 MG PO TABS: 4.0000 mg | ORAL_TABLET | Freq: Four times a day (QID) | ORAL | 1 refills | Status: DC | PRN

## 2020-09-05 MED ORDER — SODIUM CHLORIDE (PF) 0.9 % IJ SOLN
INTRAMUSCULAR | Status: DC | PRN
  Administered 2020-09-05: 30 mL

## 2020-09-05 MED ORDER — TRANEXAMIC ACID 1000 MG/10ML IV SOLN
INTRAVENOUS | Status: DC | PRN
  Administered 2020-09-05: 2000 mg via TOPICAL

## 2020-09-05 MED ORDER — BUPIVACAINE LIPOSOME 1.3 % IJ SUSP
INTRAMUSCULAR | Status: DC | PRN
  Administered 2020-09-05: 20 mL

## 2020-09-05 MED ORDER — EPHEDRINE SULFATE-NACL 50-0.9 MG/10ML-% IV SOSY
PREFILLED_SYRINGE | INTRAVENOUS | Status: DC | PRN
  Administered 2020-09-05 (×5): 10 mg via INTRAVENOUS

## 2020-09-05 MED ORDER — LACTATED RINGERS IV SOLN: INTRAVENOUS | Status: DC

## 2020-09-05 MED ORDER — CEFAZOLIN SODIUM-DEXTROSE 2-4 GM/100ML-% IV SOLN: 2.0000 g | Freq: Four times a day (QID) | INTRAVENOUS | Status: DC

## 2020-09-05 MED ORDER — 0.9 % SODIUM CHLORIDE (POUR BTL) OPTIME
TOPICAL | Status: DC | PRN
  Administered 2020-09-05: 1000 mL

## 2020-09-05 MED ORDER — PROPOFOL 10 MG/ML IV BOLUS
INTRAVENOUS | Status: AC
  Filled 2020-09-05: qty 20

## 2020-09-05 MED ORDER — SODIUM CHLORIDE 0.9% IV SOLUTION
INTRAVENOUS | Status: DC | PRN
  Administered 2020-09-05: 1000 mL

## 2020-09-05 MED ORDER — CHLORHEXIDINE GLUCONATE 0.12 % MT SOLN
15.0000 mL | Freq: Once | OROMUCOSAL | Status: AC
  Administered 2020-09-05: 15 mL via OROMUCOSAL

## 2020-09-05 MED ORDER — LACTATED RINGERS IV BOLUS
500.0000 mL | Freq: Once | INTRAVENOUS | Status: AC
  Administered 2020-09-05: 500 mL via INTRAVENOUS

## 2020-09-05 MED ORDER — MIDAZOLAM HCL 5 MG/5ML IJ SOLN
INTRAMUSCULAR | Status: DC | PRN
  Administered 2020-09-05: 2 mg via INTRAVENOUS

## 2020-09-05 MED ORDER — SODIUM CHLORIDE (PF) 0.9 % IJ SOLN
INTRAMUSCULAR | Status: AC
  Filled 2020-09-05: qty 30

## 2020-09-05 MED ORDER — LIDOCAINE HCL (CARDIAC) PF 100 MG/5ML IV SOSY
PREFILLED_SYRINGE | INTRAVENOUS | Status: DC | PRN
  Administered 2020-09-05: 50 mg via INTRATRACHEAL
  Administered 2020-09-05: 25 mg via INTRATRACHEAL

## 2020-09-05 MED ORDER — POVIDONE-IODINE 10 % EX SWAB
2.0000 "application " | Freq: Once | CUTANEOUS | Status: AC
  Administered 2020-09-05: 2 via TOPICAL

## 2020-09-05 MED ORDER — FENTANYL CITRATE (PF) 100 MCG/2ML IJ SOLN
50.0000 ug | Freq: Once | INTRAMUSCULAR | Status: AC
  Administered 2020-09-05: 50 ug via INTRAVENOUS
  Filled 2020-09-05: qty 2

## 2020-09-05 MED ORDER — PROPOFOL 500 MG/50ML IV EMUL
INTRAVENOUS | Status: DC | PRN
  Administered 2020-09-05: 25 ug/kg/min via INTRAVENOUS

## 2020-09-05 MED ORDER — ASPIRIN EC 81 MG PO TBEC: 81.0000 mg | DELAYED_RELEASE_TABLET | Freq: Two times a day (BID) | ORAL | 0 refills | Status: AC

## 2020-09-05 MED ORDER — ONDANSETRON HCL 4 MG/2ML IJ SOLN
INTRAMUSCULAR | Status: DC | PRN
  Administered 2020-09-05: 4 mg via INTRAVENOUS

## 2020-09-05 MED ORDER — FENTANYL CITRATE (PF) 100 MCG/2ML IJ SOLN
INTRAMUSCULAR | Status: AC
  Filled 2020-09-05: qty 2

## 2020-09-05 MED ORDER — ACETAMINOPHEN 10 MG/ML IV SOLN: 1000.0000 mg | Freq: Once | INTRAVENOUS | Status: DC | PRN

## 2020-09-05 MED ORDER — FENTANYL CITRATE (PF) 100 MCG/2ML IJ SOLN
INTRAMUSCULAR | Status: DC | PRN
  Administered 2020-09-05 (×4): 25 ug via INTRAVENOUS

## 2020-09-05 MED ORDER — PROPOFOL 500 MG/50ML IV EMUL
INTRAVENOUS | Status: DC | PRN
  Administered 2020-09-05: 50 mg via INTRAVENOUS

## 2020-09-05 MED ORDER — PROPOFOL 500 MG/50ML IV EMUL
INTRAVENOUS | Status: AC
  Filled 2020-09-05: qty 50

## 2020-09-05 MED ORDER — MEPERIDINE HCL 50 MG/ML IJ SOLN: 6.2500 mg | INTRAMUSCULAR | Status: DC | PRN

## 2020-09-05 MED ORDER — MIDAZOLAM HCL 2 MG/2ML IJ SOLN
1.0000 mg | Freq: Once | INTRAMUSCULAR | Status: AC
  Administered 2020-09-05 (×2): 1 mg via INTRAVENOUS
  Filled 2020-09-05: qty 2

## 2020-09-05 MED ORDER — LACTATED RINGERS IV BOLUS
250.0000 mL | Freq: Once | INTRAVENOUS | Status: AC
Start: 2020-09-05 — End: 2020-09-05
  Administered 2020-09-05: 250 mL via INTRAVENOUS

## 2020-09-05 MED ORDER — HYDROCODONE-ACETAMINOPHEN 5-325 MG PO TABS: 1.0000 | ORAL_TABLET | Freq: Four times a day (QID) | ORAL | 0 refills | Status: DC | PRN

## 2020-09-05 MED ORDER — MIDAZOLAM HCL 2 MG/2ML IJ SOLN
INTRAMUSCULAR | Status: AC
  Filled 2020-09-05: qty 2

## 2020-09-05 MED ORDER — LACTATED RINGERS IV BOLUS
250.0000 mL | Freq: Once | INTRAVENOUS | Status: AC
  Administered 2020-09-05: 250 mL via INTRAVENOUS

## 2020-09-05 MED ORDER — BUPIVACAINE-EPINEPHRINE (PF) 0.25% -1:200000 IJ SOLN
INTRAMUSCULAR | Status: AC
  Filled 2020-09-05: qty 30

## 2020-09-05 MED ORDER — TRANEXAMIC ACID-NACL 1000-0.7 MG/100ML-% IV SOLN: 1000.0000 mg | Freq: Once | INTRAVENOUS | Status: DC

## 2020-09-05 MED ORDER — PHENYLEPHRINE 40 MCG/ML (10ML) SYRINGE FOR IV PUSH (FOR BLOOD PRESSURE SUPPORT)
PREFILLED_SYRINGE | INTRAVENOUS | Status: DC | PRN
  Administered 2020-09-05: 80 ug via INTRAVENOUS
  Administered 2020-09-05: 120 ug via INTRAVENOUS

## 2020-09-05 MED ORDER — TRANEXAMIC ACID-NACL 1000-0.7 MG/100ML-% IV SOLN
1000.0000 mg | INTRAVENOUS | Status: AC
  Administered 2020-09-05: 1000 mg via INTRAVENOUS
  Filled 2020-09-05: qty 100

## 2020-09-05 MED ORDER — STERILE WATER FOR IRRIGATION IR SOLN
Status: DC | PRN
  Administered 2020-09-05: 2000 mL

## 2020-09-05 MED ORDER — BUPIVACAINE-EPINEPHRINE (PF) 0.25% -1:200000 IJ SOLN
INTRAMUSCULAR | Status: DC | PRN
  Administered 2020-09-05: 20 mL via PERINEURAL

## 2020-09-05 MED ORDER — PHENYLEPHRINE 40 MCG/ML (10ML) SYRINGE FOR IV PUSH (FOR BLOOD PRESSURE SUPPORT)
PREFILLED_SYRINGE | INTRAVENOUS | Status: AC
  Filled 2020-09-05: qty 10

## 2020-09-05 MED ORDER — BUPIVACAINE-EPINEPHRINE 0.5% -1:200000 IJ SOLN
INTRAMUSCULAR | Status: DC | PRN
  Administered 2020-09-05: 30 mL

## 2020-09-05 MED ORDER — ACETAMINOPHEN 325 MG PO TABS: 325.0000 mg | ORAL_TABLET | Freq: Once | ORAL | Status: DC | PRN

## 2020-09-05 MED ORDER — ORAL CARE MOUTH RINSE: 15.0000 mL | Freq: Once | OROMUCOSAL | Status: AC

## 2020-09-05 MED ORDER — BUPIVACAINE IN DEXTROSE 0.75-8.25 % IT SOLN
INTRATHECAL | Status: DC | PRN
  Administered 2020-09-05: 1.6 mL via INTRATHECAL

## 2020-09-05 MED ORDER — HYDROMORPHONE HCL 1 MG/ML IJ SOLN: 0.2500 mg | INTRAMUSCULAR | Status: DC | PRN

## 2020-09-05 MED ORDER — EPHEDRINE 5 MG/ML INJ
INTRAVENOUS | Status: AC
  Filled 2020-09-05: qty 10

## 2020-09-05 MED ORDER — AMISULPRIDE (ANTIEMETIC) 5 MG/2ML IV SOLN: 10.0000 mg | Freq: Once | INTRAVENOUS | Status: DC | PRN

## 2020-09-05 MED FILL — tiZANidine HCL 4 MG TABS: 4 | 10 days supply | Qty: 40 | Fill #0

## 2020-09-05 MED FILL — HYDROCODON-APAP 5-325: 5-325 | 7 days supply | Qty: 40 | Fill #0

## 2020-09-05 SURGICAL SUPPLY — 53 items
ATTUNE MED DOME PAT 41 KNEE (Knees) ×2 IMPLANT
ATTUNE PS FEM RT SZ 7 CEM KNEE (Femur) ×2 IMPLANT
ATTUNE PSRP INSR SZ7 10 KNEE (Insert) ×2 IMPLANT
BAG DECANTER FOR FLEXI CONT (MISCELLANEOUS) ×2 IMPLANT
BAG SPEC THK2 15X12 ZIP CLS (MISCELLANEOUS) ×1
BAG ZIPLOCK 12X15 (MISCELLANEOUS) ×2 IMPLANT
BASE TIBIAL ROT PLAT SZ 8 KNEE (Knees) ×1 IMPLANT
BLADE SAGITTAL 25.0X1.19X90 (BLADE) ×2 IMPLANT
BLADE SAW SGTL 11.0X1.19X90.0M (BLADE) ×2 IMPLANT
BNDG ELASTIC 6X5.8 VLCR STR LF (GAUZE/BANDAGES/DRESSINGS) ×2 IMPLANT
BOOTIES KNEE HIGH SLOAN (MISCELLANEOUS) ×2 IMPLANT
BOWL SMART MIX CTS (DISPOSABLE) ×2 IMPLANT
BSPLAT TIB 8 CMNT ROT PLAT STR (Knees) ×1 IMPLANT
CEMENT HV SMART SET (Cement) ×4 IMPLANT
COVER SURGICAL LIGHT HANDLE (MISCELLANEOUS) ×2 IMPLANT
COVER WAND RF STERILE (DRAPES) ×2 IMPLANT
CUFF TOURN SGL QUICK 34 (TOURNIQUET CUFF) ×2
CUFF TRNQT CYL 34X4.125X (TOURNIQUET CUFF) ×1 IMPLANT
DECANTER SPIKE VIAL GLASS SM (MISCELLANEOUS) ×4 IMPLANT
DRAPE ORTHO SPLIT 77X108 STRL (DRAPES)
DRAPE SHEET LG 3/4 BI-LAMINATE (DRAPES) ×2 IMPLANT
DRAPE SURG ORHT 6 SPLT 77X108 (DRAPES) IMPLANT
DRAPE TOP 10253 STERILE (DRAPES) ×2 IMPLANT
DRAPE U-SHAPE 47X51 STRL (DRAPES) ×2 IMPLANT
DRSG AQUACEL AG ADV 3.5X10 (GAUZE/BANDAGES/DRESSINGS) ×2 IMPLANT
DURAPREP 26ML APPLICATOR (WOUND CARE) ×4 IMPLANT
ELECT REM PT RETURN 15FT ADLT (MISCELLANEOUS) ×2 IMPLANT
GLOVE SRG 8 PF TXTR STRL LF DI (GLOVE) ×2 IMPLANT
GLOVE SURG ENC MOIS LTX SZ8 (GLOVE) ×4 IMPLANT
GLOVE SURG UNDER POLY LF SZ8 (GLOVE) ×4
GOWN STRL REUS W/TWL XL LVL3 (GOWN DISPOSABLE) ×4 IMPLANT
HANDPIECE INTERPULSE COAX TIP (DISPOSABLE) ×2
HOLDER FOLEY CATH W/STRAP (MISCELLANEOUS) IMPLANT
HOOD PEEL AWAY FLYTE STAYCOOL (MISCELLANEOUS) ×6 IMPLANT
KIT TURNOVER KIT A (KITS) ×2 IMPLANT
MANIFOLD NEPTUNE II (INSTRUMENTS) ×2 IMPLANT
NS IRRIG 1000ML POUR BTL (IV SOLUTION) ×2 IMPLANT
PACK TOTAL KNEE CUSTOM (KITS) ×2 IMPLANT
PAD ARMBOARD 7.5X6 YLW CONV (MISCELLANEOUS) ×2 IMPLANT
PENCIL SMOKE EVACUATOR (MISCELLANEOUS) IMPLANT
PIN DRILL FIX HALF THREAD (BIT) ×2 IMPLANT
PIN STEINMAN FIXATION KNEE (PIN) ×2 IMPLANT
PROTECTOR NERVE ULNAR (MISCELLANEOUS) ×2 IMPLANT
SET HNDPC FAN SPRY TIP SCT (DISPOSABLE) ×1 IMPLANT
SUT ETHIBOND NAB CT1 #1 30IN (SUTURE) ×4 IMPLANT
SUT VIC AB 0 CT1 36 (SUTURE) ×2 IMPLANT
SUT VIC AB 2-0 CT1 27 (SUTURE) ×2
SUT VIC AB 2-0 CT1 TAPERPNT 27 (SUTURE) ×1 IMPLANT
SUT VICRYL AB 3-0 FS1 BRD 27IN (SUTURE) ×2 IMPLANT
TIBIAL BASE ROT PLAT SZ 8 KNEE (Knees) ×2 IMPLANT
TRAY FOLEY MTR SLVR 16FR STAT (SET/KITS/TRAYS/PACK) IMPLANT
WATER STERILE IRR 1000ML POUR (IV SOLUTION) ×2 IMPLANT
WRAP KNEE MAXI GEL POST OP (GAUZE/BANDAGES/DRESSINGS) ×2 IMPLANT

## 2020-09-05 NOTE — Progress Notes (Signed)
Assisted Dr. Suella Broad with Right Knee Adductor canal  block. Side rails up, monitors on throughout procedure. See vital signs in flow sheet. Tolerated Procedure well.

## 2020-09-05 NOTE — Transfer of Care (Signed)
Immediate Anesthesia Transfer of Care Note  Patient: [name] [name]  Procedure(s) Performed: RIGHT TOTAL KNEE ARTHROPLASTY (Right Knee)  Patient Location: PACU  Anesthesia Type:Spinal and MAC combined with regional for post-op pain  Level of Consciousness: awake, alert  and patient cooperative  Airway & Oxygen Therapy: Patient Spontanous Breathing and Patient connected to face mask oxygen  Post-op Assessment: Report given to RN and Post -op Vital signs reviewed and stable  Post vital signs: Reviewed and stable  Last Vitals:  Vitals Value Taken Time  BP 102/73 09/05/20 1526  Temp    Pulse 65 09/05/20 1528  Resp 19 09/05/20 1528  SpO2 100 % 09/05/20 1528  Vitals shown include unvalidated device data.  Last Pain:  Vitals:   09/05/20 0959  PainSc: 0-No pain         Complications: No complications documented.

## 2020-09-05 NOTE — Evaluation (Signed)
Physical Therapy Evaluation Patient Details Name: [name] [name] MRN: [id number] DOB: [birth year] Today's Date: 09/05/2020   History of Present Illness  patient is a 70 y.o. male s/p Rt TKA on 09/05/2020 with PMh significant for HTN, DM, bradycardia, OA, axniety, Lt TKA (2020), and spine surgery.  Clinical Impression  Pt is a 70y.o. male s/p Rt TKA POD 0. Pt reports that he is modified independent with intermittent use of SPC for mobility at baseline. Pt required MIN guard and verbal cues for sit to stand transfers. Pt required MIN guard-supervision for ambulation ~160'ft with verbal cues for RW management and step to gait pattern with no LOB or knee buckling. Pt was able to safely perform stair negotiation with 1 rail and SPC and MIN assist-MIN guard with cues for sequencing. PT reviewed therapeutic interventions for promotion of DVT prevention, pt demonstrated understanding. Pt's wife and children will be assisting at home. Pt is currently at a safe mobility level for discharge. Recommend home with family support. Pt will benefit from skilled PT to increase independence and safety with mobility.      Follow Up Recommendations Outpatient PT;Follow surgeon's recommendation for DC plan and follow-up therapies    Equipment Recommendations  None recommended by PT (pt owns RW)    Recommendations for Other Services       Precautions / Restrictions Precautions Precautions: Fall Restrictions Weight Bearing Restrictions: No Other Position/Activity Restrictions: WBAT      Mobility  Bed Mobility Overal bed mobility: Needs Assistance Bed Mobility: Supine to Sit     Supine to sit: Supervision;HOB elevated     General bed mobility comments: use of bed rails and B UEs to scoot to EOB wtih supervision for safety    Transfers Overall transfer level: Needs assistance Equipment used: Rolling walker (2 wheeled) Transfers: Sit to/from Stand Sit to Stand: Min guard         General transfer  comment: MIN guard for safety with cues for safe hand placement  Ambulation/Gait Ambulation/Gait assistance: Min guard;Supervision Gait Distance (Feet): 160 Feet Assistive device: Rolling walker (2 wheeled) Gait Pattern/deviations: Step-to pattern;Decreased stride length;Decreased weight shift to right Gait velocity: decr   General Gait Details: MIN guard progressing to supervision for safety with cues for RW management, step to gait pattern and use of increased WB through UEs on RW for pain control of Rt LE. Pt's wife was able to demonstrate safe guarding position with cues from therapist.  Stairs Stairs: Yes Stairs assistance: Min assist;Min guard Stair Management: One rail Right;Forwards;With cane Number of Stairs: 3 General stair comments: MIN assist-MIN guard for safety with cues for sequencing "up with the good,d own with teh bad." Pt and wife able to verbalize safe guarding position for family members and correct sequencing pattern.  Wheelchair Mobility    Modified Rankin (Stroke Patients Only)       Balance Overall balance assessment: Needs assistance Sitting-balance support: Feet supported Sitting balance-Leahy Scale: Good     Standing balance support: Bilateral upper extremity supported;During functional activity;No upper extremity supported Standing balance-Leahy Scale: Fair Standing balance comment: pt was able to perform hand hygiene without use of B UEs on RW with supervision for safety with no LOB.                             Pertinent Vitals/Pain Pain Assessment: 0-10 Pain Score: 5  Pain Location: Rt knee Pain Descriptors / Indicators: Sore;Discomfort Pain Intervention(s): Limited activity  within patient's tolerance;Monitored during session;Repositioned    Home Living Family/patient expects to be discharged to:: Private residence Living Arrangements: Spouse/significant other Available Help at Discharge: Family Type of Home: House Home  Access: Level entry     [place]: [place]: Environmental consultant - 2 wheels;Cane - single point;Shower seat;Bedside commode;Grab bars - tub/shower Additional Comments: pt will have assist from wife and children at home. Pt will be sleeping on 2nd level.    Prior Function Level of Independence: Independent with assistive device(s)         Comments: intermittent use of SPC     Hand Dominance   Dominant Hand: Right    Extremity/Trunk Assessment   Upper Extremity Assessment Upper Extremity Assessment: Overall WFL for tasks assessed    Lower Extremity Assessment Lower Extremity Assessment: RLE deficits/detail RLE Deficits / Details: pt with good quad strength and 4+/5 B dorsi/plantar flexion strength. Pt able to complete full SLR with no extensor lag. RLE Sensation: WNL RLE Coordination: WNL    Cervical / Trunk Assessment Cervical / Trunk Assessment: Normal  Communication   Communication: No difficulties  Cognition Arousal/Alertness: Awake/alert Behavior During Therapy: WFL for tasks assessed/performed Overall Cognitive Status: Within Functional Limits for tasks assessed                                        General Comments      Exercises Total Joint Exercises Ankle Circles/Pumps: AROM;Both;10 reps;Seated Quad Sets: AROM;Right;5 reps;Seated Short Arc Quad: AROM;Right;5 reps;Seated Heel Slides: AROM;Right;5 reps;Seated   Assessment/Plan    PT Assessment Patient needs continued PT services  PT Problem List Decreased strength;Decreased range of motion;Decreased activity tolerance;Decreased balance;Decreased mobility;Decreased knowledge of use of DME;Pain       PT Treatment Interventions DME instruction;Gait training;Stair training;Functional mobility training;Therapeutic activities;Therapeutic exercise;Balance training;Patient/family education    PT Goals (Current goals can be found in the Care Plan section)  Acute Rehab PT Goals Patient  Stated Goal: get back to traveling PT Goal Formulation: With patient/family Time For Goal Achievement: 09/12/20 Potential to Achieve Goals: Good    Frequency 7X/week   Barriers to discharge        Co-evaluation               AM-PAC PT "6 Clicks" Mobility  Outcome Measure Help needed turning from your back to your side while in a flat bed without using bedrails?: None Help needed moving from lying on your back to sitting on the side of a flat bed without using bedrails?: None Help needed moving to and from a bed to a chair (including a wheelchair)?: A Little Help needed standing up from a chair using your arms (e.g., wheelchair or bedside chair)?: A Little Help needed to walk in hospital room?: A Little Help needed climbing 3-5 steps with a railing? : A Little 6 Click Score: 20    End of Session Equipment Utilized During Treatment: Gait belt Activity Tolerance: Patient tolerated treatment well Patient left: in chair;with call bell/phone within reach;with family/visitor present Nurse Communication: Mobility status PT Visit Diagnosis: Unsteadiness on feet (R26.81);Muscle weakness (generalized) (M62.81);Pain Pain - Right/Left: Right Pain - part of body: Knee    Time: 3545-6256 PT Time Calculation (min) (ACUTE ONLY): 41 min   Charges:              Elna Breslow, SPT  Acute rehab    Elna Breslow  09/05/2020, 6:38 PM

## 2020-09-05 NOTE — Op Note (Signed)
PREOP DIAGNOSIS: DJD RIGHT KNEE POSTOP DIAGNOSIS: same PROCEDURE: RIGHT TKR ANESTHESIA: Spinal and MAC ATTENDING SURGEON: Hessie Dibble ASSISTANT: Loni Dolly PA  INDICATIONS FOR PROCEDURE: [name] [name] is a 70 y.o. male who has struggled for a long time with pain due to degenerative arthritis of the right knee.  The patient has failed many conservative non-operative measures and at this point has pain which limits the ability to sleep and walk.  The patient is offered total knee replacement.  Informed operative consent was obtained after discussion of possible risks of anesthesia, infection, neurovascular injury, DVT, and death.  The importance of the post-operative rehabilitation protocol to optimize result was stressed extensively with the patient.  SUMMARY OF FINDINGS AND PROCEDURE:  [name] [name] was taken to the operative suite where under the above anesthesia a right knee replacement was performed.  There were advanced degenerative changes and the bone quality was excellent.  We used the DePuy Attune system and placed size 7 femur, 8 tibia, 41 mm all polyethylene patella, and a size 10 mm spacer.  Loni Dolly PA-C assisted throughout and was invaluable to the completion of the case in that he helped retract and maintain exposure while I placed components.  He also helped close thereby minimizing OR time.  The patient was admitted for appropriate post-op care to include perioperative antibiotics and mechanical and pharmacologic measures for DVT prophylaxis.  DESCRIPTION OF PROCEDURE:  [name] [name] was taken to the operative suite where the above anesthesia was applied.  The patient was positioned supine and prepped and draped in normal sterile fashion.  An appropriate time out was performed.  After the administration of kefzol pre-op antibiotic the leg was elevated and exsanguinated and a tourniquet inflated. A standard longitudinal incision was made on the anterior knee.   Dissection was carried down to the extensor mechanism.  All appropriate anti-infective measures were used including the pre-operative antibiotic, betadine impregnated drape, and closed hooded exhaust systems for each member of the surgical team.  A medial parapatellar incision was made in the extensor mechanism and the knee cap flipped and the knee flexed.  Some residual meniscal tissues were removed along with any remaining ACL/PCL tissue.  A guide was placed on the tibia and a flat cut was made on it's superior surface.  An intramedullary guide was placed in the femur and was utilized to make anterior and posterior cuts creating an appropriate flexion gap.  A second intramedullary guide was placed in the femur to make a distal cut properly balancing the knee with an extension gap equal to the flexion gap.  The three bones sized to the above mentioned sizes and the appropriate guides were placed and utilized.  A trial reduction was done and the knee easily came to full extension and the patella tracked well on flexion.  The trial components were removed and all bones were cleaned with pulsatile lavage and then dried thoroughly.  Cement was mixed and was pressurized onto the bones followed by placement of the aforementioned components.  Excess cement was trimmed and pressure was held on the components until the cement had hardened.  The tourniquet was deflated and a small amount of bleeding was controlled with cautery and pressure.  The knee was irrigated thoroughly.  The extensor mechanism was re-approximated with #1 ethibond in interrupted fashion.  The knee was flexed and the repair was solid.  The subcutaneous tissues were re-approximated with #0 and #2-0 vicryl and the skin closed with  a subcuticular stitch and steristrips.  A sterile dressing was applied.  Intraoperative fluids, EBL, and tourniquet time can be obtained from anesthesia records.  DISPOSITION:  The patient was taken to recovery room in stable  condition and admitted for appropriate post-op care to include peri-operative antibiotic and DVT prophylaxis with mechanical and pharmacologic measures.  Monico Blitz Dalldorf 09/05/2020, 3:00 PM

## 2020-09-05 NOTE — Anesthesia Procedure Notes (Signed)
Anesthesia Regional Block: Adductor canal block   Pre-Anesthetic Checklist: ,, timeout performed, Correct Patient, Correct Site, Correct Laterality, Correct Procedure, Correct Position, site marked, Risks and benefits discussed,  Surgical consent,  Pre-op evaluation,  At surgeon's request and post-op pain management  Laterality: Right  Prep: chloraprep       Needles:  Injection technique: Single-shot  Needle Type: Echogenic Stimulator Needle     Needle Length: 9cm  Needle Gauge: 21     Additional Needles:   Procedures:,,,, ultrasound used (permanent image in chart),,,,  Narrative:  Start time: 09/05/2020 11:20 AM End time: 09/05/2020 11:25 AM Injection made incrementally with aspirations every 5 mL.  Performed by: Personally  Anesthesiologist: Effie Berkshire, MD  Additional Notes: Patient tolerated the procedure well. Local anesthetic introduced in an incremental fashion under minimal resistance after negative aspirations. No paresthesias were elicited. After completion of the procedure, no acute issues were identified and patient continued to be monitored by RN.

## 2020-09-05 NOTE — Anesthesia Preprocedure Evaluation (Addendum)
Anesthesia Evaluation  Patient identified by MRN, date of birth, ID band Patient awake    Reviewed: Allergy & Precautions, NPO status , Patient's Chart, lab work & pertinent test results  Airway Mallampati: III  TM Distance: >3 FB Neck ROM: Full    Dental no notable dental hx. (+) Teeth Intact, Dental Advisory Given   Pulmonary neg pulmonary ROS,    Pulmonary exam normal        Cardiovascular hypertension, Pt. on medications and Pt. on home beta blockers  Rhythm:Regular Rate:Bradycardia     Neuro/Psych Anxiety negative neurological ROS     GI/Hepatic Neg liver ROS, GERD  Medicated,  Endo/Other  diabetes, Type 2, Oral Hypoglycemic Agents  Renal/GU negative Renal ROS     Musculoskeletal  (+) Arthritis ,   Abdominal Normal abdominal exam  (+)   Peds  Hematology negative hematology ROS (+)   Anesthesia Other Findings   Reproductive/Obstetrics                            Anesthesia Physical Anesthesia Plan  ASA: II  Anesthesia Plan: Spinal   Post-op Pain Management:  Regional for Post-op pain   Induction: Intravenous  PONV Risk Score and Plan: 2 and Ondansetron and Propofol infusion  Airway Management Planned: Natural Airway and Simple Face Mask  Additional Equipment: None  Intra-op Plan:   Post-operative Plan:   Informed Consent: I have reviewed the patients History and Physical, chart, labs and discussed the procedure including the risks, benefits and alternatives for the proposed anesthesia with the patient or authorized representative who has indicated his/her understanding and acceptance.       Plan Discussed with: CRNA  Anesthesia Plan Comments: (Lab Results      Component                Value               Date                      WBC                      4.3                 08/30/2020                HGB                      13.6                08/30/2020                 HCT                      41.3                08/30/2020                MCV                      96.0                08/30/2020                PLT                      239  08/30/2020            Lab Results      Component                Value               Date                      CREATININE               1.05                08/30/2020                BUN                      16                  08/30/2020                NA                       139                 08/30/2020                K                        4.0                 08/30/2020                CL                       104                 08/30/2020                CO2                      24                  08/30/2020           )       Anesthesia Quick Evaluation

## 2020-09-05 NOTE — Anesthesia Procedure Notes (Signed)
Spinal  Start time: 09/05/2020 1:12 PM End time: 09/05/2020 1:14 PM Staffing Performed: anesthesiologist  Anesthesiologist: Effie Berkshire, MD Preanesthetic Checklist Completed: patient identified, IV checked, site marked, risks and benefits discussed, surgical consent, monitors and equipment checked, pre-op evaluation and timeout performed Spinal Block Patient position: sitting Prep: DuraPrep and site prepped and draped Location: L3-4 Injection technique: single-shot Needle Needle type: Pencan  Needle gauge: 24 G Needle length: 10 cm Needle insertion depth: 10 cm Additional Notes Patient tolerated well. No immediate complications.

## 2020-09-05 NOTE — Interval H&P Note (Signed)
History and Physical Interval Note:  09/05/2020 12:08 PM  [name] [name]  has presented today for surgery, with the diagnosis of RIGHT KNEE DEGENERATIVE JOINT DISEASE.  The various methods of treatment have been discussed with the patient and family. After consideration of risks, benefits and other options for treatment, the patient has consented to  Procedure(s): RIGHT TOTAL KNEE ARTHROPLASTY (Right) as a surgical intervention.  The patient's history has been reviewed, patient examined, no change in status, stable for surgery.  I have reviewed the patient's chart and labs.  Questions were answered to the patient's satisfaction.     Hessie Dibble

## 2020-09-06 ENCOUNTER — Other Ambulatory Visit (HOSPITAL_COMMUNITY): Payer: Self-pay | Admitting: Physician Assistant

## 2020-09-06 ENCOUNTER — Encounter (HOSPITAL_COMMUNITY): Payer: Self-pay

## 2020-09-06 ENCOUNTER — Emergency Department (HOSPITAL_COMMUNITY)
Admission: EM | Admit: 2020-09-06 | Discharge: 2020-09-06 | Disposition: A | Discharge status: Home / Self Care | Payer: Medicare Other | Attending: Emergency Medicine | Admitting: Emergency Medicine

## 2020-09-06 ENCOUNTER — Other Ambulatory Visit: Payer: Self-pay

## 2020-09-06 DIAGNOSIS — R55 Syncope and collapse: Secondary | ICD-10-CM | POA: Diagnosis not present

## 2020-09-06 DIAGNOSIS — R42 Dizziness and giddiness: Secondary | ICD-10-CM | POA: Diagnosis not present

## 2020-09-06 DIAGNOSIS — R404 Transient alteration of awareness: Secondary | ICD-10-CM | POA: Diagnosis not present

## 2020-09-06 DIAGNOSIS — Z7982 Long term (current) use of aspirin: Secondary | ICD-10-CM | POA: Insufficient documentation

## 2020-09-06 DIAGNOSIS — I5022 Chronic systolic (congestive) heart failure: Secondary | ICD-10-CM | POA: Diagnosis not present

## 2020-09-06 DIAGNOSIS — Z7984 Long term (current) use of oral hypoglycemic drugs: Secondary | ICD-10-CM | POA: Insufficient documentation

## 2020-09-06 DIAGNOSIS — I110 Hypertensive heart disease with heart failure: Secondary | ICD-10-CM | POA: Diagnosis not present

## 2020-09-06 DIAGNOSIS — I499 Cardiac arrhythmia, unspecified: Secondary | ICD-10-CM | POA: Diagnosis not present

## 2020-09-06 DIAGNOSIS — R6889 Other general symptoms and signs: Secondary | ICD-10-CM | POA: Diagnosis not present

## 2020-09-06 DIAGNOSIS — Z79899 Other long term (current) drug therapy: Secondary | ICD-10-CM | POA: Diagnosis not present

## 2020-09-06 DIAGNOSIS — E119 Type 2 diabetes mellitus without complications: Secondary | ICD-10-CM | POA: Diagnosis not present

## 2020-09-06 DIAGNOSIS — Z96653 Presence of artificial knee joint, bilateral: Secondary | ICD-10-CM | POA: Insufficient documentation

## 2020-09-06 DIAGNOSIS — R7989 Other specified abnormal findings of blood chemistry: Secondary | ICD-10-CM | POA: Insufficient documentation

## 2020-09-06 DIAGNOSIS — Z743 Need for continuous supervision: Secondary | ICD-10-CM | POA: Diagnosis not present

## 2020-09-06 LAB — CBC WITH DIFFERENTIAL/PLATELET
Abs Immature Granulocytes: 0.01 10*3/uL (ref 0.00–0.07)
Basophils Absolute: 0 10*3/uL (ref 0.0–0.1)
Basophils Relative: 0 %
Eosinophils Absolute: 0 10*3/uL (ref 0.0–0.5)
Eosinophils Relative: 0 %
HCT: 30.7 % — ABNORMAL LOW (ref 39.0–52.0)
Hemoglobin: 10.4 g/dL — ABNORMAL LOW (ref 13.0–17.0)
Immature Granulocytes: 0 %
Lymphocytes Relative: 15 %
Lymphs Abs: 0.8 10*3/uL (ref 0.7–4.0)
MCH: 32.8 pg (ref 26.0–34.0)
MCHC: 33.9 g/dL (ref 30.0–36.0)
MCV: 96.8 fL (ref 80.0–100.0)
Monocytes Absolute: 0.8 10*3/uL (ref 0.1–1.0)
Monocytes Relative: 15 %
Neutro Abs: 4.1 10*3/uL (ref 1.7–7.7)
Neutrophils Relative %: 70 %
Platelets: 187 10*3/uL (ref 150–400)
RBC: 3.17 MIL/uL — ABNORMAL LOW (ref 4.22–5.81)
RDW: 12.9 % (ref 11.5–15.5)
WBC: 5.8 10*3/uL (ref 4.0–10.5)
nRBC: 0 % (ref 0.0–0.2)

## 2020-09-06 LAB — COMPREHENSIVE METABOLIC PANEL
ALT: 15 U/L (ref 0–44)
AST: 16 U/L (ref 15–41)
Albumin: 3.2 g/dL — ABNORMAL LOW (ref 3.5–5.0)
Alkaline Phosphatase: 48 U/L (ref 38–126)
Anion gap: 9 (ref 5–15)
BUN: 10 mg/dL (ref 8–23)
CO2: 23 mmol/L (ref 22–32)
Calcium: 8.7 mg/dL — ABNORMAL LOW (ref 8.9–10.3)
Chloride: 104 mmol/L (ref 98–111)
Creatinine, Ser: 1.25 mg/dL — ABNORMAL HIGH (ref 0.61–1.24)
GFR, Estimated: >60 mL/min (ref >60)
Glucose, Bld: 188 mg/dL — ABNORMAL HIGH (ref 70–99)
Potassium: 3.8 mmol/L (ref 3.5–5.1)
Sodium: 136 mmol/L (ref 135–145)
Total Bilirubin: 2 mg/dL — ABNORMAL HIGH (ref 0.3–1.2)
Total Protein: 6.3 g/dL — ABNORMAL LOW (ref 6.5–8.1)

## 2020-09-06 LAB — CBG MONITORING, ED: Glucose-Capillary: 167 mg/dL — ABNORMAL HIGH (ref 70–99)

## 2020-09-06 LAB — TSH: TSH: 0.564 u[IU]/mL (ref 0.350–4.500)

## 2020-09-06 LAB — TROPONIN I (HIGH SENSITIVITY)
Troponin I (High Sensitivity): 5 ng/L (ref <18)
Troponin I (High Sensitivity): 6 ng/L (ref <18)

## 2020-09-06 LAB — MAGNESIUM: Magnesium: 1.4 mg/dL — ABNORMAL LOW (ref 1.7–2.4)

## 2020-09-06 MED ORDER — ACETAMINOPHEN 325 MG PO TABS
650.0000 mg | ORAL_TABLET | Freq: Once | ORAL | Status: AC
  Administered 2020-09-06: 650 mg via ORAL
  Filled 2020-09-06: qty 2

## 2020-09-06 MED ORDER — MAGNESIUM SULFATE 2 GM/50ML IV SOLN
2.0000 g | Freq: Once | INTRAVENOUS | Status: AC
  Administered 2020-09-06: 2 g via INTRAVENOUS
  Filled 2020-09-06: qty 50

## 2020-09-06 MED ORDER — SODIUM CHLORIDE 0.9 % IV BOLUS
1000.0000 mL | Freq: Once | INTRAVENOUS | Status: AC
  Administered 2020-09-06: 1000 mL via INTRAVENOUS

## 2020-09-06 MED ORDER — MAGNESIUM OXIDE -MG SUPPLEMENT 200 MG PO TABS: 200.0000 mg | ORAL_TABLET | Freq: Every day | ORAL | 0 refills | Status: DC

## 2020-09-06 NOTE — Discharge Instructions (Addendum)
Your magnesium level was low today.  I have given you magnesium pills to take for the next few days. Make sure you follow-up with your primary care provider to recheck this level.  Also follow-up with your orthopedic specialist tomorrow as regularly scheduled. Return to the ER if you start to experience additional syncopal episodes, chest pain, severe headache, leg swelling.

## 2020-09-06 NOTE — ED Triage Notes (Signed)
Pt arrives via [facility] for near syncopal episode while in bathroom. No strenuous activity when episode occurred. Pt was initially hypotensive to 90/70 with no orthostatic changes. Pt reportedly has hx of HTN and took his normal BP medicine this morning. Pt had R knee surgery 3/1.   EMS last VS 111/49, HR 80, CBG 187, RR 18, 99% on RA.   #18 L AC with 400 mL NS

## 2020-09-06 NOTE — ED Provider Notes (Signed)
[facility] EMERGENCY DEPARTMENT Provider Note   CSN: [id number] Arrival date & time: 09/06/20  1012     History Chief Complaint  Patient presents with  . Near Syncope    [name] [name] is a 70 y.o. male with a past medical history of NIDDM, hypertension, arthritis status post right knee replacement yesterday presenting to the ED with a chief complaint of syncope.  States that last night he was discharged from the hospital after his surgery.  He ate a [place] sandwich around 5 or 6 PM and has not eaten anything since.  This morning he woke up, took all of his morning medications including his newly prescribed Norco and tizanidine.  He was sitting on a chair at his sink attempting to give himself a sponge bath when he syncopized.  His wife and his son were there and witness and said that it lasted for just a few seconds. He did not require any intervention to regain consciousness.  He believes this syncopal episode is due to taking all of his medications on empty stomach.  States that this is not happened to him in the past.  He did not fall to the ground denies any head injury.  It denies any preceding or current chest pain, headache, dizziness, blurry vision, shortness of breath.  He has been nonambulatory since his surgery and is scheduled to see his orthopedist tomorrow.  Denies any anticoagulant use.  No vomiting, diarrhea but does report decreased appetite due to his recent surgery.  HPI     Past Medical History:  Diagnosis Date  . Anxiety   . Arthritis   . Bradycardia    hx of  in 30's medications changed  . Diabetes mellitus without complication ([place])   . DJD (degenerative joint disease) of knee    Left  . Hx of adenomatous colonic polyps 12/2019   2 diminutive  . Hypertension     Patient Active Problem List   Diagnosis Date Noted  . Primary osteoarthritis of right knee 09/05/2020  . Primary osteoarthritis of left knee 07/28/2018  . Chronic systolic  heart failure ([place]) 07/15/2018  . Abdominal aortic atherosclerosis ([place]) 01/14/2018  . PVC's (premature ventricular contractions) 10/05/2017  . Hyperlipidemia associated with type 2 diabetes mellitus ([place]) 10/05/2017  . BMI 40.0-44.9, adult ([place]) 10/05/2017  . History of gout 07/04/2013  . Low serum testosterone level 07/04/2013  . GE reflux 07/06/2012  . Type 2 diabetes mellitus with complication, without long-term current use of insulin ([place]) 03/09/2012  . Osteoarthritis of both knees 03/09/2012  . Obesity 03/09/2012  . Erectile dysfunction 03/02/2012  . Hypertension 03/02/2012    Past Surgical History:  Procedure Laterality Date  . CARDIAC CATHETERIZATION  07/23/2018  . CHOLECYSTECTOMY N/A 02/04/2016   Procedure: LAPAROSCOPIC CHOLECYSTECTOMY;  Surgeon: Leighton Ruff, MD;  Location: [facility];  Service: General;  Laterality: N/A;  . COLONOSCOPY  12/13/2019  . ERCP N/A 02/02/2016   Procedure: ENDOSCOPIC RETROGRADE CHOLANGIOPANCREATOGRAPHY (ERCP);  Surgeon: [name], MD;  Location: [facility];  Service: Endoscopy;  Laterality: N/A;  . HERNIA REPAIR    . RIGHT/LEFT HEART CATH AND CORONARY ANGIOGRAPHY N/A 07/23/2018   Procedure: RIGHT/LEFT HEART CATH AND CORONARY ANGIOGRAPHY;  Surgeon: Belva Crome, MD;  Location: [facility];  Service: Cardiovascular;  Laterality: N/A;  . SPINE SURGERY    . TOTAL KNEE ARTHROPLASTY Left 07/28/2018   Procedure: TOTAL KNEE ARTHROPLASTY;  Surgeon: Melrose Nakayama, MD;  Location: [facility];  Service:  Orthopedics;  Laterality: Left;       Family History  Problem Relation Age of Onset  . Stroke Mother   . Hypertension Mother   . Colon cancer Neg Hx   . Stomach cancer Neg Hx   . Rectal cancer Neg Hx   . Pancreatic cancer Neg Hx     Social History   Tobacco Use  . Smoking status: Never Smoker  . Smokeless tobacco: Never Used  Vaping Use  . Vaping Use: Never used  Substance Use Topics  . Alcohol use: No  . Drug use: No    Home  Medications Prior to Admission medications   Medication Sig Start Date End Date Taking? Authorizing Provider  ALPRAZolam (XANAX) 0.5 MG tablet TAKE 1 TABLET BY MOUTH TWICE A DAY AS NEEDED FOR ANXIETY Patient taking differently: Take 0.5 mg by mouth 2 (two) times daily as needed for anxiety. 07/17/20  Yes Baxley, Cresenciano Lick, MD  amLODipine (NORVASC) 10 MG tablet TAKE 1 TABLET BY MOUTH DAILY. Patient taking differently: Take 10 mg by mouth daily. 11/15/19  Yes Elby Showers, MD  aspirin EC 81 MG tablet Take 1 tablet (81 mg total) by mouth 2 (two) times daily after a meal. 09/05/20  Yes Nida, Mitzi Hansen, PA-C  atorvastatin (LIPITOR) 10 MG tablet TAKE 1 TABLET BY MOUTH DAILY. Patient taking differently: Take 10 mg by mouth daily. 08/15/20  Yes Baxley, Cresenciano Lick, MD  bismuth subsalicylate (PEPTO BISMOL) 262 MG/15ML suspension Take 30 mLs by mouth every 6 (six) hours as needed for indigestion.   Yes [provider]  carvedilol (COREG) 25 MG tablet TAKE 1 TABLET BY MOUTH 2 TIMES DAILY. Patient taking differently: Take 25 mg by mouth 2 (two) times daily. 07/18/20  Yes Belva Crome, MD  esomeprazole (NEXIUM) 40 MG capsule Take 1 capsule (40 mg total) by mouth daily. 06/29/20  Yes Baxley, Cresenciano Lick, MD  furosemide (LASIX) 40 MG tablet TAKE 1 TABLET BY MOUTH DAILY Patient taking differently: Take 40 mg by mouth daily. 02/16/20  Yes Baxley, Cresenciano Lick, MD  HYDROcodone-acetaminophen (NORCO/VICODIN) 5-325 MG tablet Take 1-2 tablets by mouth every 6 (six) hours as needed for moderate pain (post op pain). 09/05/20 09/05/21 Yes Loni Dolly, PA-C  Magnesium Oxide 200 MG TABS Take 1 tablet (200 mg total) by mouth daily. 09/06/20  Yes Kaci Dillie, PA-C  metFORMIN (GLUCOPHAGE-XR) 500 MG 24 hr tablet TAKE 1 TABLET BY MOUTH TWICE A DAY Patient taking differently: Take 500 mg by mouth 2 (two) times daily. 05/18/20  Yes Baxley, Cresenciano Lick, MD  sacubitril-valsartan (ENTRESTO) 97-103 MG Take 1 tablet by mouth 2 (two) times daily. 05/27/19  Yes  Belva Crome, MD  sildenafil (VIAGRA) 100 MG tablet Take 50 mg by mouth daily as needed for erectile dysfunction. 07/21/20  Yes [provider]  spironolactone (ALDACTONE) 25 MG tablet TAKE 1 TABLET (25 MG TOTAL) BY MOUTH 2 TIMES DAILY. Patient taking differently: Take 25 mg by mouth 2 (two) times daily. 06/16/20  Yes Belva Crome, MD  tiZANidine (ZANAFLEX) 4 MG tablet Take 1 tablet (4 mg total) by mouth every 6 (six) hours as needed for muscle spasms. 09/05/20 09/05/21 Yes Loni Dolly, PA-C  zolpidem (AMBIEN) 5 MG tablet TAKE 1 TABLET (5 MG TOTAL) BY MOUTH AT BEDTIME AS NEEDED FOR SLEEP. 04/11/20  Yes Baxley, Cresenciano Lick, MD  tamsulosin (FLOMAX) 0.4 MG CAPS capsule Take 1 capsule (0.4 mg total) by mouth daily. Patient not taking: No sig reported 07/05/20  Elby Showers, MD    Allergies    Patient has no known allergies.  Review of Systems   Review of Systems  Constitutional: Negative for appetite change, chills and fever.  HENT: Negative for ear pain, rhinorrhea, sneezing and sore throat.   Eyes: Negative for photophobia and visual disturbance.  Respiratory: Negative for cough, chest tightness, shortness of breath and wheezing.   Cardiovascular: Negative for chest pain and palpitations.  Gastrointestinal: Negative for abdominal pain, blood in stool, constipation, diarrhea, nausea and vomiting.  Genitourinary: Negative for dysuria, hematuria and urgency.  Musculoskeletal: Negative for myalgias.  Skin: Negative for rash.  Neurological: Positive for syncope. Negative for dizziness, weakness and light-headedness.    Physical Exam Updated Vital Signs BP 133/62   Pulse 78   Temp 99 F (37.2 C) (Oral)   Resp 18   Ht 5\' 10"  (1.778 m)   Wt 123.8 kg   SpO2 99%   BMI 39.17 kg/m   Physical Exam Vitals and nursing note reviewed.  Constitutional:      General: He is not in acute distress.    Appearance: He is well-developed and well-nourished.  HENT:     Head: Normocephalic and  atraumatic.     Nose: Nose normal.  Eyes:     General: No scleral icterus.       Right eye: No discharge.        Left eye: No discharge.     Extraocular Movements: EOM normal.     Conjunctiva/sclera: Conjunctivae normal.     Pupils: Pupils are equal, round, and reactive to light.  Cardiovascular:     Rate and Rhythm: Normal rate and regular rhythm.     Pulses: Intact distal pulses.     Heart sounds: Normal heart sounds. No murmur heard. No friction rub. No gallop.   Pulmonary:     Effort: Pulmonary effort is normal. No respiratory distress.     Breath sounds: Normal breath sounds.  Abdominal:     General: Bowel sounds are normal. There is no distension.     Palpations: Abdomen is soft.     Tenderness: There is no abdominal tenderness. There is no guarding.  Musculoskeletal:        General: No edema. Normal range of motion.     Cervical back: Normal range of motion and neck supple.  Skin:    General: Skin is warm and dry.     Findings: No rash.     Comments: Compression stockings noted on bilateral lower extremities.  Neurological:     Mental Status: He is alert and oriented to person, place, and time.     Cranial Nerves: No cranial nerve deficit.     Sensory: No sensory deficit.     Motor: No weakness or abnormal muscle tone.     Coordination: Coordination normal.     Comments: Pupils reactive. No facial asymmetry noted. Cranial nerves appear grossly intact. Sensation intact to light touch on face, BUE and BLE. Strength 5/5 in BUE and BLE.   Psychiatric:        Mood and Affect: Mood and affect normal.     ED Results / Procedures / Treatments   Labs (all labs ordered are listed, but only abnormal results are displayed) Labs Reviewed  CBC WITH DIFFERENTIAL/PLATELET - Abnormal; Notable for the following components:      Result Value   RBC 3.17 (*)    Hemoglobin 10.4 (*)    HCT 30.7 (*)  All other components within normal limits  COMPREHENSIVE METABOLIC PANEL -  Abnormal; Notable for the following components:   Glucose, Bld 188 (*)    Creatinine, Ser 1.25 (*)    Calcium 8.7 (*)    Total Protein 6.3 (*)    Albumin 3.2 (*)    Total Bilirubin 2.0 (*)    All other components within normal limits  MAGNESIUM - Abnormal; Notable for the following components:   Magnesium 1.4 (*)    All other components within normal limits  CBG MONITORING, ED - Abnormal; Notable for the following components:   Glucose-Capillary 167 (*)    All other components within normal limits  TSH  TROPONIN I (HIGH SENSITIVITY)  TROPONIN I (HIGH SENSITIVITY)    EKG EKG Interpretation  Date/Time:  Wednesday September 06 2020 10:20:04 EST Ventricular Rate:  82 PR Interval:    QRS Duration: 88 QT Interval:  471 QTC Calculation: 551 R Axis:   22 Text Interpretation: Sinus rhythm Borderline prolonged PR interval Borderline abnrm T, anterolateral leads Prolonged QT interval When compared to prior, longer QTc and some wandering baseline. No STEMI Confirmed by Antony Blackbird [phone]) on 09/06/2020 10:28:13 AM   Radiology No results found.  Procedures Procedures   Medications Ordered in ED Medications  acetaminophen (TYLENOL) tablet 650 mg (has no administration in time range)  sodium chloride 0.9 % bolus 1,000 mL (0 mLs Intravenous Stopped 09/06/20 1130)  magnesium sulfate IVPB 2 g 50 mL (0 g Intravenous Stopped 09/06/20 1252)    ED Course  I have reviewed the triage vital signs and the nursing notes.  Pertinent labs & imaging results that were available during my care of the patient were reviewed by me and considered in my medical decision making (see chart for details).  Clinical Course as of 09/06/20 1427  Wed Sep 06, 2020  1116 Glucose-Capillary(!): 167 [HK]  1235 Creatinine(!): 1.25 [HK]  1235 Magnesium(!): 1.4 Will replete IV. [HK]  1235 Troponin I (High Sensitivity): 6 [HK]    Clinical Course User Index [HK] Delia Heady, PA-C   MDM Rules/Calculators/A&P                           70 year old male with past medical history of NIDDM, hypertension, arthritis status post right knee replacement yesterday presenting to the ED with a chief complaint of syncope.  Discharged from hospital last night after surgery.  Ate a [place] sandwich around 5 or 6 PM has not been eating anything since.  This morning woke up, took all of his morning medications on an empty stomach.  He was prescribed Norco and tizanidine after his surgery.  He was sitting on a chair at his sink attempting to give himself a sponge bath when he syncopized.  This lasted for several seconds.  Patient believes that this is due to his decreased p.o. intake as well as taking all of these medications on an empty stomach.  Denies history of syncope in the past.  States that he did not have any head injury and did not fall to the ground.  No preceding or current chest pain, headache, dizziness, blurry vision, shortness of breath.  Scheduled to see his orthopedist for follow-up tomorrow. On exam patient without any neurological deficits.  He has bilateral lower extremity compression stockings noted.  Equal and intact distal pulses and sensation bilaterally.  EKG shows sinus rhythm, prolonged QT that is longer than prior tracings.  CMP with slight elevation  in creatinine was given IV fluids.  CBC is unremarkable.  Magnesium level slightly low at 1.4.  Initial delta troponin both negative.  TSH is normal.  Patient was given IV magnesium here with improvement in his QTC.  He was observed here for 4 hours with improvement in his symptoms.  States that he is comfortable with discharge home and outpatient follow-up with his orthopedist.  We will give him magnesium to take by mouth and have his PCP recheck his magnesium level outpatient.  Advised him to be cautious while utilizing tizanidine and Norco as this could have caused his symptoms.  Doubt PE as his vital signs within normal limits and he has no worsening swelling related  to his surgery.  Patient is agreeable to the plan.  Return precautions given.   Patient is hemodynamically stable, in NAD. Evaluation does not show pathology that would require ongoing emergent intervention or inpatient treatment. I explained the diagnosis to the patient. Pain has been managed and has no complaints prior to discharge. Patient is comfortable with above plan and is stable for discharge at this time. All questions were answered prior to disposition. Strict return precautions for returning to the ED were discussed. Encouraged follow up with PCP.   An After Visit Summary was printed and given to the patient.   Portions of this note were generated with [employer]. Dictation errors may occur despite best attempts at proofreading.  Final Clinical Impression(s) / ED Diagnoses Final diagnoses:  Hypomagnesemia  Syncope and collapse    Rx / DC Orders ED Discharge Orders         Ordered    Magnesium Oxide 200 MG TABS  Daily        09/06/20 1423           Delia Heady, PA-C 09/06/20 1427    Tegeler, Gwenyth Allegra, MD 09/09/20 [phone]

## 2020-09-06 NOTE — ED Notes (Signed)
Pt given water and crackers. Pt tolerating PO intake.

## 2020-09-06 NOTE — Anesthesia Postprocedure Evaluation (Signed)
Anesthesia Post Note  Patient: [name] [name]  Procedure(s) Performed: RIGHT TOTAL KNEE ARTHROPLASTY (Right Knee)     Patient location during evaluation: PACU Anesthesia Type: Spinal Level of consciousness: oriented and awake and alert Pain management: pain level controlled Vital Signs Assessment: post-procedure vital signs reviewed and stable Respiratory status: spontaneous breathing, respiratory function stable and patient connected to nasal cannula oxygen Cardiovascular status: blood pressure returned to baseline and stable Postop Assessment: no headache, no backache and no apparent nausea or vomiting Anesthetic complications: no   No complications documented.  Last Vitals:  Vitals:   09/05/20 1656 09/05/20 1830  BP:    Pulse: (!) 56 67  Resp: 16   Temp:    SpO2: 100% 100%    Last Pain:  Vitals:   09/05/20 1830  PainSc: 0-No pain                 Effie Berkshire

## 2020-09-07 ENCOUNTER — Encounter (HOSPITAL_COMMUNITY): Payer: Self-pay | Admitting: Orthopaedic Surgery

## 2020-09-11 MED FILL — ESOMEPRAZOLE MAG DR 40 MG C: 40 | 90 days supply | Qty: 90 | Fill #1

## 2020-09-11 MED FILL — SPIRONOLACTONE 25 MG TABS: 25 | 90 days supply | Qty: 180 | Fill #1

## 2020-09-12 DIAGNOSIS — M25661 Stiffness of right knee, not elsewhere classified: Secondary | ICD-10-CM | POA: Diagnosis not present

## 2020-09-12 DIAGNOSIS — R262 Difficulty in walking, not elsewhere classified: Secondary | ICD-10-CM | POA: Diagnosis not present

## 2020-09-12 DIAGNOSIS — R531 Weakness: Secondary | ICD-10-CM | POA: Diagnosis not present

## 2020-09-12 DIAGNOSIS — Z96651 Presence of right artificial knee joint: Secondary | ICD-10-CM | POA: Diagnosis not present

## 2020-09-13 MED FILL — ALPRAZolam 0.5 MG TABS: 0.5 | 30 days supply | Qty: 60 | Fill #2

## 2020-09-14 DIAGNOSIS — R531 Weakness: Secondary | ICD-10-CM | POA: Diagnosis not present

## 2020-09-14 DIAGNOSIS — Z96651 Presence of right artificial knee joint: Secondary | ICD-10-CM | POA: Diagnosis not present

## 2020-09-14 DIAGNOSIS — M25661 Stiffness of right knee, not elsewhere classified: Secondary | ICD-10-CM | POA: Diagnosis not present

## 2020-09-14 DIAGNOSIS — R262 Difficulty in walking, not elsewhere classified: Secondary | ICD-10-CM | POA: Diagnosis not present

## 2020-09-15 ENCOUNTER — Other Ambulatory Visit (HOSPITAL_COMMUNITY): Payer: Self-pay | Admitting: Orthopedic Surgery

## 2020-09-15 DIAGNOSIS — Z96659 Presence of unspecified artificial knee joint: Secondary | ICD-10-CM | POA: Diagnosis not present

## 2020-09-15 MED FILL — HYDROCODON-APAP 5-325: 5-325 | 15 days supply | Qty: 30 | Fill #0

## 2020-09-18 DIAGNOSIS — R262 Difficulty in walking, not elsewhere classified: Secondary | ICD-10-CM | POA: Diagnosis not present

## 2020-09-18 DIAGNOSIS — M25661 Stiffness of right knee, not elsewhere classified: Secondary | ICD-10-CM | POA: Diagnosis not present

## 2020-09-18 DIAGNOSIS — R531 Weakness: Secondary | ICD-10-CM | POA: Diagnosis not present

## 2020-09-18 DIAGNOSIS — Z96651 Presence of right artificial knee joint: Secondary | ICD-10-CM | POA: Diagnosis not present

## 2020-09-20 DIAGNOSIS — R531 Weakness: Secondary | ICD-10-CM | POA: Diagnosis not present

## 2020-09-20 DIAGNOSIS — M25661 Stiffness of right knee, not elsewhere classified: Secondary | ICD-10-CM | POA: Diagnosis not present

## 2020-09-20 DIAGNOSIS — R262 Difficulty in walking, not elsewhere classified: Secondary | ICD-10-CM | POA: Diagnosis not present

## 2020-09-20 DIAGNOSIS — Z96651 Presence of right artificial knee joint: Secondary | ICD-10-CM | POA: Diagnosis not present

## 2020-09-22 DIAGNOSIS — Z96651 Presence of right artificial knee joint: Secondary | ICD-10-CM | POA: Diagnosis not present

## 2020-09-22 DIAGNOSIS — R262 Difficulty in walking, not elsewhere classified: Secondary | ICD-10-CM | POA: Diagnosis not present

## 2020-09-22 DIAGNOSIS — R531 Weakness: Secondary | ICD-10-CM | POA: Diagnosis not present

## 2020-09-22 DIAGNOSIS — M25661 Stiffness of right knee, not elsewhere classified: Secondary | ICD-10-CM | POA: Diagnosis not present

## 2020-09-25 DIAGNOSIS — R262 Difficulty in walking, not elsewhere classified: Secondary | ICD-10-CM | POA: Diagnosis not present

## 2020-09-25 DIAGNOSIS — M25661 Stiffness of right knee, not elsewhere classified: Secondary | ICD-10-CM | POA: Diagnosis not present

## 2020-09-25 DIAGNOSIS — Z96651 Presence of right artificial knee joint: Secondary | ICD-10-CM | POA: Diagnosis not present

## 2020-09-25 DIAGNOSIS — R531 Weakness: Secondary | ICD-10-CM | POA: Diagnosis not present

## 2020-09-27 DIAGNOSIS — Z96651 Presence of right artificial knee joint: Secondary | ICD-10-CM | POA: Diagnosis not present

## 2020-09-27 DIAGNOSIS — R262 Difficulty in walking, not elsewhere classified: Secondary | ICD-10-CM | POA: Diagnosis not present

## 2020-09-27 DIAGNOSIS — R531 Weakness: Secondary | ICD-10-CM | POA: Diagnosis not present

## 2020-09-27 DIAGNOSIS — M25661 Stiffness of right knee, not elsewhere classified: Secondary | ICD-10-CM | POA: Diagnosis not present

## 2020-09-29 DIAGNOSIS — R262 Difficulty in walking, not elsewhere classified: Secondary | ICD-10-CM | POA: Diagnosis not present

## 2020-09-29 DIAGNOSIS — M25661 Stiffness of right knee, not elsewhere classified: Secondary | ICD-10-CM | POA: Diagnosis not present

## 2020-09-29 DIAGNOSIS — R531 Weakness: Secondary | ICD-10-CM | POA: Diagnosis not present

## 2020-09-29 DIAGNOSIS — Z96651 Presence of right artificial knee joint: Secondary | ICD-10-CM | POA: Diagnosis not present

## 2020-10-02 DIAGNOSIS — Z96651 Presence of right artificial knee joint: Secondary | ICD-10-CM | POA: Diagnosis not present

## 2020-10-02 DIAGNOSIS — M25661 Stiffness of right knee, not elsewhere classified: Secondary | ICD-10-CM | POA: Diagnosis not present

## 2020-10-02 DIAGNOSIS — R531 Weakness: Secondary | ICD-10-CM | POA: Diagnosis not present

## 2020-10-02 DIAGNOSIS — R262 Difficulty in walking, not elsewhere classified: Secondary | ICD-10-CM | POA: Diagnosis not present

## 2020-10-04 DIAGNOSIS — R262 Difficulty in walking, not elsewhere classified: Secondary | ICD-10-CM | POA: Diagnosis not present

## 2020-10-04 DIAGNOSIS — M25661 Stiffness of right knee, not elsewhere classified: Secondary | ICD-10-CM | POA: Diagnosis not present

## 2020-10-04 DIAGNOSIS — Z96651 Presence of right artificial knee joint: Secondary | ICD-10-CM | POA: Diagnosis not present

## 2020-10-04 DIAGNOSIS — R531 Weakness: Secondary | ICD-10-CM | POA: Diagnosis not present

## 2020-10-05 DIAGNOSIS — R262 Difficulty in walking, not elsewhere classified: Secondary | ICD-10-CM | POA: Diagnosis not present

## 2020-10-05 DIAGNOSIS — M25661 Stiffness of right knee, not elsewhere classified: Secondary | ICD-10-CM | POA: Diagnosis not present

## 2020-10-05 DIAGNOSIS — R531 Weakness: Secondary | ICD-10-CM | POA: Diagnosis not present

## 2020-10-05 DIAGNOSIS — Z96651 Presence of right artificial knee joint: Secondary | ICD-10-CM | POA: Diagnosis not present

## 2020-10-11 DIAGNOSIS — Z96651 Presence of right artificial knee joint: Secondary | ICD-10-CM | POA: Diagnosis not present

## 2020-10-11 DIAGNOSIS — R531 Weakness: Secondary | ICD-10-CM | POA: Diagnosis not present

## 2020-10-11 DIAGNOSIS — R262 Difficulty in walking, not elsewhere classified: Secondary | ICD-10-CM | POA: Diagnosis not present

## 2020-10-11 DIAGNOSIS — M25661 Stiffness of right knee, not elsewhere classified: Secondary | ICD-10-CM | POA: Diagnosis not present

## 2020-10-13 DIAGNOSIS — R531 Weakness: Secondary | ICD-10-CM | POA: Diagnosis not present

## 2020-10-13 DIAGNOSIS — R262 Difficulty in walking, not elsewhere classified: Secondary | ICD-10-CM | POA: Diagnosis not present

## 2020-10-13 DIAGNOSIS — M25661 Stiffness of right knee, not elsewhere classified: Secondary | ICD-10-CM | POA: Diagnosis not present

## 2020-10-13 DIAGNOSIS — Z96651 Presence of right artificial knee joint: Secondary | ICD-10-CM | POA: Diagnosis not present

## 2020-10-16 ENCOUNTER — Other Ambulatory Visit: Payer: Self-pay | Admitting: Internal Medicine

## 2020-10-16 ENCOUNTER — Other Ambulatory Visit: Payer: Self-pay | Admitting: Interventional Cardiology

## 2020-10-16 ENCOUNTER — Other Ambulatory Visit (HOSPITAL_COMMUNITY): Payer: Self-pay

## 2020-10-16 DIAGNOSIS — R531 Weakness: Secondary | ICD-10-CM | POA: Diagnosis not present

## 2020-10-16 DIAGNOSIS — M25661 Stiffness of right knee, not elsewhere classified: Secondary | ICD-10-CM | POA: Diagnosis not present

## 2020-10-16 DIAGNOSIS — Z96651 Presence of right artificial knee joint: Secondary | ICD-10-CM | POA: Diagnosis not present

## 2020-10-16 DIAGNOSIS — R262 Difficulty in walking, not elsewhere classified: Secondary | ICD-10-CM | POA: Diagnosis not present

## 2020-10-16 MED ORDER — ZOLPIDEM TARTRATE 5 MG PO TABS
5.0000 mg | ORAL_TABLET | Freq: Every evening | ORAL | 1 refills | Status: DC | PRN
  Filled 2020-10-16: qty 90, 90d supply, fill #0
  Filled 2021-01-15: qty 90, 90d supply, fill #1

## 2020-10-16 MED FILL — Carvedilol Tab 25 MG: ORAL | 90 days supply | Qty: 180 | Fill #0 | Status: AC

## 2020-10-16 MED FILL — Alprazolam Tab 0.5 MG: ORAL | 30 days supply | Qty: 60 | Fill #0 | Status: AC

## 2020-10-18 ENCOUNTER — Other Ambulatory Visit (HOSPITAL_COMMUNITY): Payer: Self-pay

## 2020-10-18 DIAGNOSIS — Z96651 Presence of right artificial knee joint: Secondary | ICD-10-CM | POA: Diagnosis not present

## 2020-10-18 DIAGNOSIS — M25661 Stiffness of right knee, not elsewhere classified: Secondary | ICD-10-CM | POA: Diagnosis not present

## 2020-10-18 DIAGNOSIS — R262 Difficulty in walking, not elsewhere classified: Secondary | ICD-10-CM | POA: Diagnosis not present

## 2020-10-18 DIAGNOSIS — R531 Weakness: Secondary | ICD-10-CM | POA: Diagnosis not present

## 2020-10-18 MED ORDER — SACUBITRIL-VALSARTAN 97-103 MG PO TABS
1.0000 | ORAL_TABLET | Freq: Two times a day (BID) | ORAL | 2 refills | Status: DC
  Filled 2020-10-18: qty 180, 90d supply, fill #0
  Filled 2021-01-15: qty 60, 30d supply, fill #1
  Filled 2021-01-16: qty 120, 60d supply, fill #1
  Filled 2021-03-20: qty 60, 30d supply, fill #2
  Filled 2021-04-12: qty 60, 30d supply, fill #3
  Filled 2021-05-21: qty 60, 30d supply, fill #4
  Filled 2021-06-18: qty 60, 30d supply, fill #5

## 2020-10-26 ENCOUNTER — Other Ambulatory Visit: Payer: Medicare Other | Admitting: Internal Medicine

## 2020-10-26 ENCOUNTER — Other Ambulatory Visit: Payer: Self-pay

## 2020-10-26 DIAGNOSIS — E1169 Type 2 diabetes mellitus with other specified complication: Secondary | ICD-10-CM

## 2020-10-26 DIAGNOSIS — E785 Hyperlipidemia, unspecified: Secondary | ICD-10-CM | POA: Diagnosis not present

## 2020-10-27 ENCOUNTER — Ambulatory Visit (INDEPENDENT_AMBULATORY_CARE_PROVIDER_SITE_OTHER): Payer: Medicare Other | Admitting: Internal Medicine

## 2020-10-27 ENCOUNTER — Encounter: Payer: Self-pay | Admitting: Internal Medicine

## 2020-10-27 VITALS — BP 110/80 | HR 74 | Ht 70.0 in | Wt 268.0 lb

## 2020-10-27 DIAGNOSIS — I5042 Chronic combined systolic (congestive) and diastolic (congestive) heart failure: Secondary | ICD-10-CM

## 2020-10-27 DIAGNOSIS — E7800 Pure hypercholesterolemia, unspecified: Secondary | ICD-10-CM

## 2020-10-27 DIAGNOSIS — I700 Atherosclerosis of aorta: Secondary | ICD-10-CM

## 2020-10-27 DIAGNOSIS — Z6838 Body mass index (BMI) 38.0-38.9, adult: Secondary | ICD-10-CM

## 2020-10-27 DIAGNOSIS — I10 Essential (primary) hypertension: Secondary | ICD-10-CM | POA: Diagnosis not present

## 2020-10-27 DIAGNOSIS — G4700 Insomnia, unspecified: Secondary | ICD-10-CM | POA: Diagnosis not present

## 2020-10-27 DIAGNOSIS — G4709 Other insomnia: Secondary | ICD-10-CM

## 2020-10-27 DIAGNOSIS — Z96653 Presence of artificial knee joint, bilateral: Secondary | ICD-10-CM

## 2020-10-27 DIAGNOSIS — Z8659 Personal history of other mental and behavioral disorders: Secondary | ICD-10-CM

## 2020-10-27 NOTE — Progress Notes (Signed)
Subjective:    Patient ID: [name] [name], male    DOB: 1950/10/05, 70 y.o.   MRN: [id number]  HPI 70 year old Male seen for 6 month follow up visit. He is concerned about his magnesium level.  This will be checked but suspect it will be normal.  He was in the emergency department in early March and had near syncopal episode.  Was lightheaded while standing to go to the restroom and had syncope.  EKG showed no ectopy.  He was given IV fluids and improved.  He had had knee replacement surgery the previous day.  Magnesium level in the emergency department on March 2 was 1.4 which was just barely below normal..  It is now 1.9 which is normal.  He has been reassured today as long as he was eating and drinking well this would not be an issue.  His hemoglobin A1c is excellent at 5.6% and previously was 6.1% in February.  He has responded well to physical therapy and is getting around much better.  Lipid panel March 21 was also within normal limits as were her liver functions.  I am pleased with his glucose control and lipid control at present time.   Denies chest pain or shortness of breath.  Enjoying retirement.  Considering going to a [place] of [place] in the near future.  He will monitor the COVID situation closely.  His physical exam is been scheduled for late October.  He saw Dr. Pernell Dupre in December 2021 for follow-up on nonobstructive minimal atherosclerosis of the coronaries and systolic left ventricular dysfunction treated by Dr. Tamala Julian which improved on GDMT.  Remains on metformin for glucose intolerance.  He is on Entresto and spironolactone.  He also takes carvedilol and Lasix.  He is on Lipitor 10 mg daily.  He takes Ambien 5 mg at night for sleep.  He is also on amlodipine 10 mg daily for hypertension.  He takes Xanax if needed for anxiety up to twice daily.  He is on aspirin 81 mg daily.     Objective:   Physical Exam Blood pressure 110/80 pulse 74 pulse oximetry 98% weight is 268  pounds BMI 38.45  Skin warm and dry.  No cervical adenopathy.  No carotid bruits.  No thyromegaly.  Chest is clear to auscultation.  Cardiac exam regular rate and rhythm normal S1 and S2 without murmurs or gallops.  No lower extremity pitting edema.  Affect thought and judgment are normal.     Assessment & Plan:  Nonobstructive coronary artery disease followed by Dr. Rico Ala plan.  No complaint of chest pain.  Systolic left ventricular dysfunction-stable treated by Dr. Pernell Dupre, cardiologist.  He is on Entresto and spironolactone as well as metformin.  Denies chest pain or shortness of breath.  History of hypertension-stable on current regimen  Remains on metformin for glucose intolerance with stable hemoglobin A1c  Anxiety treated with as needed Xanax  Insomnia treated with Ambien 5 mg at bedtime  BMI 38.45-continue to encourage diet exercise and weight loss.  Now that he has had knee arthroplasty perhaps he can be more mobile.  History of bilateral knee arthroplasty  History of low magnesium related to knee replacement surgery and decreased p.o. intake and fluid intake  History of abdominal aortic atherosclerosis followed by cardiology  Plan: He will return in October for health maintenance exam, Medicare wellness and evaluation of medical issues.  He was reassured his magnesium level was normal and was related to  volume depletion and not eating well after his arthroplasty.  He will continue with current medications.  Continue diet exercise and weight loss efforts.

## 2020-10-28 LAB — LIPID PANEL
Cholesterol: 128 mg/dL (ref <200)
HDL: 53 mg/dL (ref >=40)
LDL Cholesterol (Calc): 60 mg/dL (calc)
Non-HDL Cholesterol (Calc): 75 mg/dL (calc) (ref <130)
Total CHOL/HDL Ratio: 2.4 (calc) (ref <5.0)
Triglycerides: 72 mg/dL (ref <150)

## 2020-10-28 LAB — TEST AUTHORIZATION

## 2020-10-28 LAB — HEPATIC FUNCTION PANEL
AG Ratio: 1.4 (calc) (ref 1.0–2.5)
ALT: 10 U/L (ref 9–46)
AST: 12 U/L (ref 10–35)
Albumin: 4.3 g/dL (ref 3.6–5.1)
Alkaline phosphatase (APISO): 77 U/L (ref 35–144)
Bilirubin, Direct: 0.2 mg/dL (ref 0.0–0.2)
Globulin: 3 g/dL (calc) (ref 1.9–3.7)
Indirect Bilirubin: 0.8 mg/dL (calc) (ref 0.2–1.2)
Total Bilirubin: 1 mg/dL (ref 0.2–1.2)
Total Protein: 7.3 g/dL (ref 6.1–8.1)

## 2020-10-28 LAB — HEMOGLOBIN A1C
Hgb A1c MFr Bld: 5.6 % of total Hgb (ref <5.7)
Mean Plasma Glucose: 114 mg/dL
eAG (mmol/L): 6.3 mmol/L

## 2020-10-28 LAB — MAGNESIUM: Magnesium: 1.9 mg/dL (ref 1.5–2.5)

## 2020-10-28 NOTE — Patient Instructions (Signed)
It was a pleasure to see you today.  Continue current medications.  We are glad your knee arthroplasty went well.  Continue diet exercise and weight loss efforts.  Return in 6 months for health maintenance exam and Medicare wellness visit.  Continue to watch her diet.  May continue with Xanax sparingly for anxiety and Ambien for insomnia.  Magnesium level is normal.  Low level was related to not eating and drinking well after knee arthroplasty.  Continue follow-up with Cardiology.

## 2020-11-06 ENCOUNTER — Other Ambulatory Visit (HOSPITAL_COMMUNITY): Payer: Self-pay

## 2020-11-06 DIAGNOSIS — Z9889 Other specified postprocedural states: Secondary | ICD-10-CM | POA: Diagnosis not present

## 2020-11-06 MED ORDER — ZOSTER VAC RECOMB ADJUVANTED 50 MCG/0.5ML IM SUSR
0.5000 mL | Freq: Once | INTRAMUSCULAR | 0 refills | Status: AC
  Filled 2020-11-06: qty 0.5, 1d supply, fill #0

## 2020-11-13 ENCOUNTER — Other Ambulatory Visit: Payer: Self-pay

## 2020-11-14 ENCOUNTER — Other Ambulatory Visit: Payer: Self-pay | Admitting: Internal Medicine

## 2020-11-14 ENCOUNTER — Other Ambulatory Visit (HOSPITAL_COMMUNITY): Payer: Self-pay

## 2020-11-14 IMAGING — CR DG ABDOMEN 1V
2 series · 2 of 2 positions shown · non-contrast
Comparison: Radiographs and CT scan dated 12/13/2017

CLINICAL DATA: Mid abdominal pain and constipation.

EXAM:
ABDOMEN - 1 VIEW

[t abdomen supine (1 of 2)]
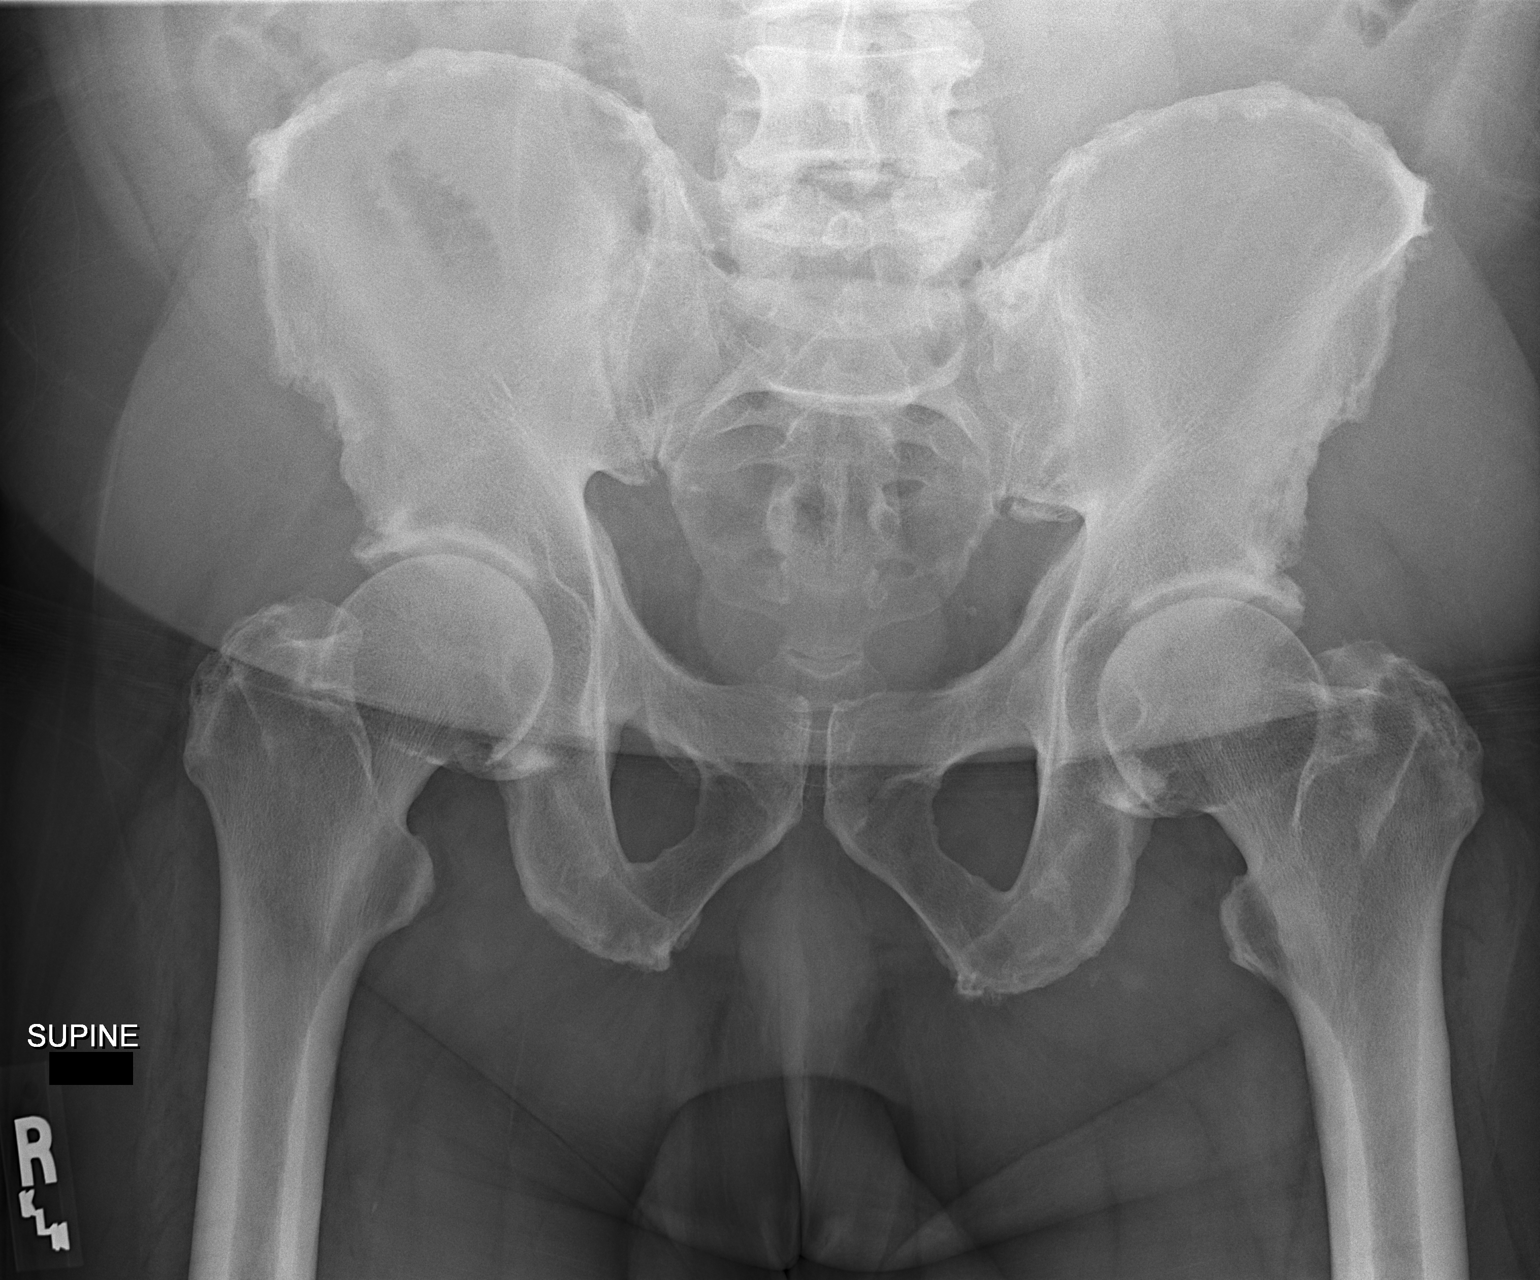

[t abdomen supine (2 of 2)]
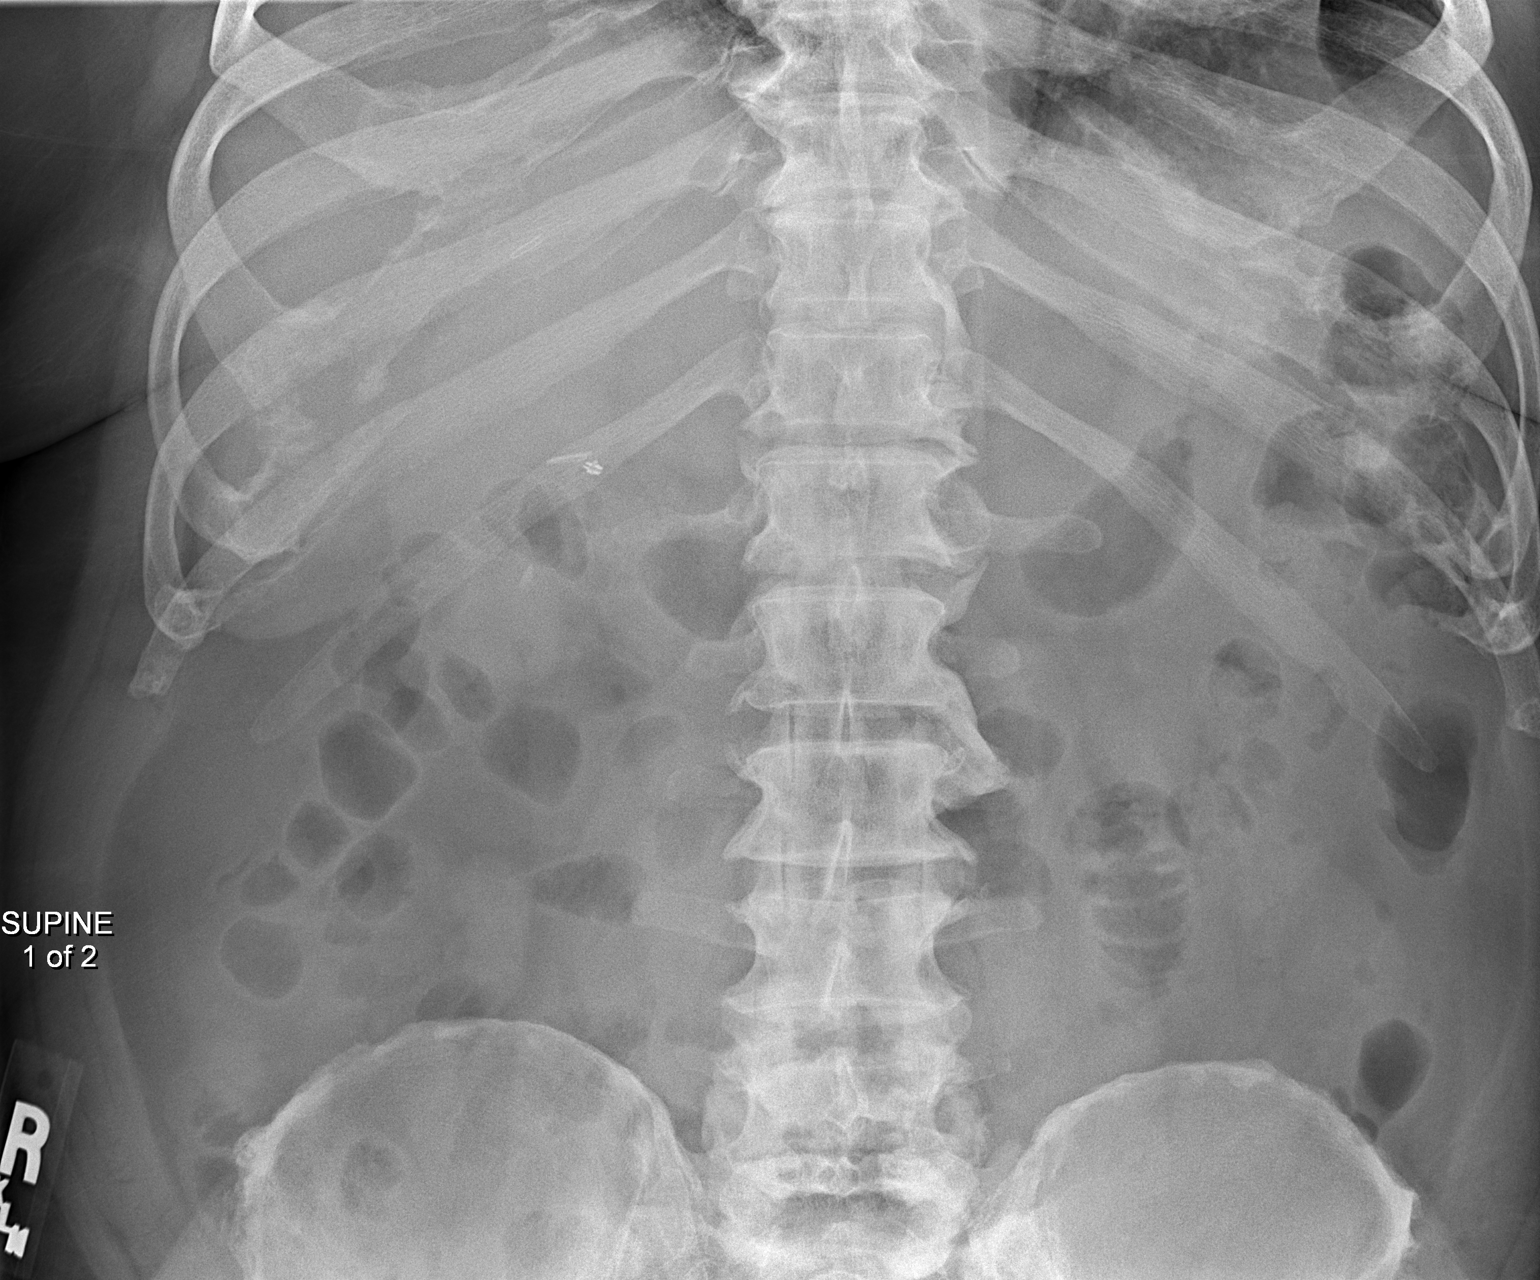

[2 of 2 positions shown; findings below may reference images not displayed]

FINDINGS: The bowel gas pattern is normal. Surgical clips in the right upper
quadrant from previous cholecystectomy. Small stone in the lower
pole the right kidney, unchanged from the prior CT scan of
12/13/2017. No acute bone abnormality.
IMPRESSION: Benign-appearing abdomen. No significant stool in the colon. No
fecal impaction.

## 2020-11-14 MED ORDER — AMLODIPINE BESYLATE 10 MG PO TABS
10.0000 mg | ORAL_TABLET | Freq: Every day | ORAL | 3 refills | Status: DC
  Filled 2020-11-14: qty 90, 90d supply, fill #0
  Filled 2021-03-05: qty 90, 90d supply, fill #1
  Filled 2021-06-04: qty 90, 90d supply, fill #2
  Filled 2021-09-03: qty 90, 90d supply, fill #3

## 2020-11-14 MED FILL — Metformin HCl Tab ER 24HR 500 MG: ORAL | 90 days supply | Qty: 180 | Fill #0 | Status: AC

## 2020-11-14 MED FILL — Alprazolam Tab 0.5 MG: ORAL | 30 days supply | Qty: 60 | Fill #1 | Status: AC

## 2020-11-14 MED FILL — Atorvastatin Calcium Tab 10 MG (Base Equivalent): ORAL | 90 days supply | Qty: 90 | Fill #0 | Status: AC

## 2020-11-14 MED FILL — Furosemide Tab 40 MG: ORAL | 90 days supply | Qty: 90 | Fill #0 | Status: AC

## 2020-11-15 ENCOUNTER — Telehealth: Payer: Self-pay | Admitting: Interventional Cardiology

## 2020-11-15 DIAGNOSIS — I5042 Chronic combined systolic (congestive) and diastolic (congestive) heart failure: Secondary | ICD-10-CM

## 2020-11-15 NOTE — Telephone Encounter (Signed)
     Pt requesting to speak with Dr. Thompson Caul nurse Anderson Malta. Asked info what he needs but he said he will tell it to [place] when she calls him back

## 2020-11-15 NOTE — Telephone Encounter (Signed)
Pt called asking for Anderson Malta, he says that he received a reminder and was not sure if he is due for anything.   I reviewed his chart and I could only see possibly a BMET.. he is not due for an OV... I advised him that I will forward to [facility] for review.   He was agreeable and will wait for a call back later in the week.

## 2020-11-16 NOTE — Telephone Encounter (Signed)
Spoke with pt and he states he received a letter about making his fall appt.  Pt states Dr. Tamala Julian said he'd see him back in November or December.  Advised pt that schedule it not open yet.  He will call back later to make this appt.  I did go ahead and schedule pt for labs in July to monitor kidneys since he is on Spironolactone.  Pt appreciative for call.

## 2020-11-16 NOTE — Telephone Encounter (Signed)
Left detailed message on pt's phone letting him know that I do no see where anyone has reached out about anything being due at this time.  I did mention that he would need a BMET in July (had one in March and we just need it done every 4 months).  Advised he is welcome to call back and schedule that.

## 2020-11-22 ENCOUNTER — Encounter: Payer: Self-pay | Admitting: Internal Medicine

## 2020-11-22 ENCOUNTER — Telehealth: Payer: Self-pay | Admitting: Internal Medicine

## 2020-11-22 NOTE — Telephone Encounter (Signed)
Patient called saying wife has respiratory infection symptoms. She took home Covid test and that was positive. His test was weakly positive. They will be seen here tomorrow for PCR testing.

## 2020-11-23 ENCOUNTER — Encounter: Payer: Self-pay | Admitting: Internal Medicine

## 2020-11-23 ENCOUNTER — Other Ambulatory Visit: Payer: Self-pay

## 2020-11-23 ENCOUNTER — Ambulatory Visit (INDEPENDENT_AMBULATORY_CARE_PROVIDER_SITE_OTHER): Payer: Medicare Other | Admitting: Internal Medicine

## 2020-11-23 ENCOUNTER — Other Ambulatory Visit (HOSPITAL_COMMUNITY): Payer: Self-pay

## 2020-11-23 VITALS — HR 86 | Temp 99.4°F

## 2020-11-23 DIAGNOSIS — U071 COVID-19: Secondary | ICD-10-CM

## 2020-11-23 MED ORDER — NIRMATRELVIR/RITONAVIR (PAXLOVID)TABLET
ORAL_TABLET | ORAL | 0 refills | Status: DC
  Filled 2020-11-23: qty 30, 5d supply, fill #0

## 2020-11-23 NOTE — Progress Notes (Addendum)
   Subjective:    Patient ID: [name] [name], male    DOB: 1950/10/12, 70 y.o.   MRN: [id number]  HPI Patient and his wife sent to visit their daughter out of town.  Apparently there was a Covid exposure there.  Patient has a history of nonobstructive coronary artery disease followed by Dr. Pernell Dupre as well as history of left ventricular dysfunction.  He has a history of impaired glucose tolerance.  His renal functions are generally normal but has noticed his creatinine was 1.25 in March around the time of his knee arthroplasty.  Therefore, I am giving him renal insufficiency dose of Paxil Lopid.  He and his wife took home COVID test yesterday.  His wife has more symptoms and is coughing.  He has minimal symptoms.  Her test was strongly positive and his test at home was weakly positive.  He also has a history of hypertension and obesity.  History of GE reflux and gallops.  They have flulike symptoms of malaise, fatigue.  They are not short of breath.    Review of Systems he reports no dysphagia, fever or shaking chills.     Objective:   Physical Exam Temperature 99.4 degrees pulse 86 regular pulse oximetry 96% his pharynx is injected without exudate.  TMs clear.  He sounds a bit hoarse.  Chest clear to auscultation without rales or wheezing.       Assessment & Plan:  Acute acute COVID-19    Plan: Paxlovid ( Nirmatrelir  300 mg/ Ritonavir 100 mg) twice daily for 5 days.  Rest and stay well-hydrated.  Quarantine at home minimum of 5 days.  COVID PCR test pending.

## 2020-11-23 NOTE — Patient Instructions (Addendum)
Quarantine at home for minimum of 5 days.  COVID-19 PCR test obtained today.  Take Paxlovid ( 300 mg Nirmatrelir and Ritonavir 100mg ) twice daily x 5 days.  Call if symptoms worsen.  Walk some.  Monitor pulse oximetry at home.

## 2020-11-25 LAB — SARS-COV-2 RNA,(COVID-19) QUALITATIVE NAAT: SARS CoV2 RNA: DETECTED — AB

## 2020-11-27 ENCOUNTER — Telehealth: Payer: Self-pay | Admitting: Internal Medicine

## 2020-11-27 NOTE — Telephone Encounter (Signed)
Faxed Positive Home COVID results to [facility] [phone]

## 2020-11-28 NOTE — Telephone Encounter (Signed)
[name] [name] called to say that [facility] had released him from precautions from [place], but he was still feeling dizzy. I let him know just because he had been released didn't mean he was completely over it. He could still be having symptoms. He started feeling dizzy this morning around 9:00am, and has been that way all day and he just took blood pressure and it is 187/102 02 stats are 98%.

## 2020-11-28 NOTE — Telephone Encounter (Signed)
Patient said the pharmacist told him to stay off the [facility] while he was on paxlovid, He said he will go back on it.

## 2020-11-28 NOTE — Telephone Encounter (Signed)
Tell him to take Xanax (Alprazolam) he should still have some up to twice daily for dizziness and monitor his BP twice a day

## 2020-12-06 DIAGNOSIS — Z471 Aftercare following joint replacement surgery: Secondary | ICD-10-CM | POA: Diagnosis not present

## 2020-12-06 DIAGNOSIS — Z96653 Presence of artificial knee joint, bilateral: Secondary | ICD-10-CM | POA: Diagnosis not present

## 2020-12-18 ENCOUNTER — Other Ambulatory Visit: Payer: Self-pay | Admitting: Internal Medicine

## 2020-12-18 ENCOUNTER — Other Ambulatory Visit (HOSPITAL_COMMUNITY): Payer: Self-pay

## 2020-12-18 ENCOUNTER — Other Ambulatory Visit: Payer: Self-pay | Admitting: Interventional Cardiology

## 2020-12-18 MED ORDER — SPIRONOLACTONE 25 MG PO TABS
25.0000 mg | ORAL_TABLET | Freq: Two times a day (BID) | ORAL | 0 refills | Status: DC
  Filled 2020-12-18: qty 180, 90d supply, fill #0

## 2020-12-18 MED ORDER — ESOMEPRAZOLE MAGNESIUM 40 MG PO CPDR
40.0000 mg | DELAYED_RELEASE_CAPSULE | Freq: Every day | ORAL | 3 refills | Status: DC
  Filled 2020-12-18: qty 90, 90d supply, fill #0
  Filled 2021-03-20: qty 90, 90d supply, fill #1
  Filled 2021-06-18: qty 90, 90d supply, fill #2
  Filled 2021-09-15: qty 90, 90d supply, fill #3

## 2020-12-18 MED FILL — Alprazolam Tab 0.5 MG: ORAL | 30 days supply | Qty: 60 | Fill #2 | Status: AC

## 2020-12-20 ENCOUNTER — Other Ambulatory Visit (HOSPITAL_COMMUNITY): Payer: Self-pay

## 2020-12-22 ENCOUNTER — Other Ambulatory Visit (HOSPITAL_COMMUNITY): Payer: Self-pay

## 2020-12-22 ENCOUNTER — Encounter: Payer: Self-pay | Admitting: Internal Medicine

## 2020-12-22 ENCOUNTER — Telehealth: Payer: Self-pay | Admitting: Internal Medicine

## 2020-12-22 ENCOUNTER — Ambulatory Visit
Admission: RE | Admit: 2020-12-22 | Discharge: 2020-12-22 | Disposition: A | Discharge status: Home / Self Care | Payer: Medicare Other | Source: Ambulatory Visit | Attending: Internal Medicine | Admitting: Internal Medicine

## 2020-12-22 ENCOUNTER — Ambulatory Visit (INDEPENDENT_AMBULATORY_CARE_PROVIDER_SITE_OTHER): Payer: Medicare Other | Admitting: Internal Medicine

## 2020-12-22 ENCOUNTER — Other Ambulatory Visit: Payer: Self-pay

## 2020-12-22 VITALS — BP 130/80 | HR 77 | Temp 98.6°F | Ht 70.0 in | Wt 268.0 lb

## 2020-12-22 DIAGNOSIS — Z8616 Personal history of COVID-19: Secondary | ICD-10-CM

## 2020-12-22 DIAGNOSIS — R059 Cough, unspecified: Secondary | ICD-10-CM | POA: Diagnosis not present

## 2020-12-22 DIAGNOSIS — R053 Chronic cough: Secondary | ICD-10-CM | POA: Diagnosis not present

## 2020-12-22 DIAGNOSIS — M5431 Sciatica, right side: Secondary | ICD-10-CM

## 2020-12-22 MED ORDER — AZITHROMYCIN 250 MG PO TABS
ORAL_TABLET | ORAL | 0 refills | Status: AC
  Filled 2020-12-22: qty 6, 5d supply, fill #0

## 2020-12-22 NOTE — Progress Notes (Signed)
   Subjective:    Patient ID: [name] [name], male    DOB: 05-27-1951, 70 y.o.   MRN: [id number]  HPI 70 year old Male seen in May 19 with respiratory infection symptoms, diagnosed with COVID-19 and treated with Paxlovid.  His wife also had COVID-19 and apparently had a bit of a rough time.  Patient reports protracted cough since having COVID-19.  He was sent for chest x-ray today showing stable chest with no active cardiopulmonary process.  No evidence of pneumonia.  Says the cough is slight and hacking without significant sputum production.  He has had no fever or shaking chills.  No shortness of breath.  No chest pain.    Review of Systems see above denies headache nausea vomiting or diarrhea     Objective:   Physical Exam Temperature is 98.6 degrees pulse oximetry 97% weight 268 pounds height 5 feet 10 inches BMI 38.45 blood pressure is 130/80.  He is seen in exam room in no acute distress.  His respiratory rate is normal.  No audible wheezing.  TMs are clear.  Pharynx is clear.  Neck is supple without adenopathy.  Chest is clear to auscultation without rales or wheezing.  Also complaining of pain in right buttock status post right knee arthroplasty by Dr. Rhona Raider in late February.  Had near syncopal episode after discharge presenting to the emergency department and found to have hypomagnesemia as well as volume depletion.  He does have some pain in his right buttock with straight right leg raising.     Assessment & Plan:  Cough-post COVID-19.  Treat with Zithromax Z-PAK 2 tablets day 1 followed by 1 tablet days 2 through 5.  Chest x-ray shows no pneumonia.  History of hypomagnesemia after right knee total arthroplasty due to volume depletion and decreased p.o. intake-required emergency department visit with magnesium repletion.  Question about QT interval at that time but that seemed to improve with IV magnesium replacement.  Right sided sciatica-patient reports onset of this after  right TKA.  Recommend walking and see if this will improve.  He can discuss with his orthopedist, Dr. Rhona Raider.

## 2020-12-22 NOTE — Telephone Encounter (Signed)
Ordered chest xray, patient aware to go GI, appt made to come here.

## 2020-12-22 NOTE — Telephone Encounter (Signed)
[name] [name] [phone]  [name] called to say he has a dry cough since he had COVID that want go away. It is during the day when he is sitting up, he takes Coricidin cough HPD and it goes away, but as soon as that wears off it comes back. He does not cough at night.

## 2020-12-23 NOTE — Patient Instructions (Signed)
Chest x-ray is negative for heart failure or pneumonia.  Take Zithromax Z-PAK 2 tabs day 1 followed by 1 tab days 2 through 5.  Call me if cough is not resolved in 2 weeks.  Patient is to discuss right buttock discomfort with Dr. Rhona Raider.

## 2021-01-11 ENCOUNTER — Telehealth: Payer: Self-pay

## 2021-01-11 NOTE — Telephone Encounter (Signed)
Patient notified of Dr. Verlene Mayer recommendations.

## 2021-01-11 NOTE — Telephone Encounter (Signed)
Patient went to church on Sunday after he ate his stomach started feeling bubbly on Monday he started having cramping and loose stools he has been taking pepto bismol and it has not helped. No fever no chills, no vomiting. Temperature this morning was 97.7, home COVID test this morning was negative. He would like to know what to do.  [phone]

## 2021-01-15 ENCOUNTER — Other Ambulatory Visit (HOSPITAL_COMMUNITY): Payer: Self-pay

## 2021-01-15 ENCOUNTER — Other Ambulatory Visit: Payer: Self-pay | Admitting: Internal Medicine

## 2021-01-15 MED ORDER — ALPRAZOLAM 0.5 MG PO TABS
0.5000 mg | ORAL_TABLET | Freq: Two times a day (BID) | ORAL | 5 refills | Status: DC | PRN
  Filled 2021-01-15 – 2021-01-16 (×2): qty 60, 30d supply, fill #0
  Filled 2021-02-14: qty 60, 30d supply, fill #1
  Filled 2021-03-20: qty 60, 30d supply, fill #2
  Filled 2021-04-17: qty 60, 30d supply, fill #3
  Filled 2021-05-21: qty 60, 30d supply, fill #4
  Filled 2021-06-18: qty 60, 30d supply, fill #5

## 2021-01-15 MED FILL — Carvedilol Tab 25 MG: ORAL | 90 days supply | Qty: 180 | Fill #1 | Status: AC

## 2021-01-16 ENCOUNTER — Other Ambulatory Visit (HOSPITAL_COMMUNITY): Payer: Self-pay

## 2021-01-16 ENCOUNTER — Other Ambulatory Visit: Payer: Self-pay

## 2021-01-16 ENCOUNTER — Other Ambulatory Visit: Payer: Medicare Other | Admitting: *Deleted

## 2021-01-16 DIAGNOSIS — I5042 Chronic combined systolic (congestive) and diastolic (congestive) heart failure: Secondary | ICD-10-CM

## 2021-01-16 LAB — BASIC METABOLIC PANEL
BUN/Creatinine Ratio: 11 (ref 10–24)
BUN: 12 mg/dL (ref 8–27)
CO2: 22 mmol/L (ref 20–29)
Calcium: 9.4 mg/dL (ref 8.6–10.2)
Chloride: 101 mmol/L (ref 96–106)
Creatinine, Ser: 1.13 mg/dL (ref 0.76–1.27)
Glucose: 111 mg/dL — ABNORMAL HIGH (ref 65–99)
Potassium: 3.9 mmol/L (ref 3.5–5.2)
Sodium: 139 mmol/L (ref 134–144)
eGFR: 71 mL/min/{1.73_m2} (ref >59)

## 2021-01-17 ENCOUNTER — Other Ambulatory Visit (HOSPITAL_COMMUNITY): Payer: Self-pay

## 2021-01-18 ENCOUNTER — Telehealth: Payer: Self-pay | Admitting: Interventional Cardiology

## 2021-01-18 NOTE — Telephone Encounter (Signed)
[name] [name] called to say he went back on regular diet on Monday and he has only had 3 small soft stools, should he go and get a laxative or come and see you. He is concerned.

## 2021-01-18 NOTE — Telephone Encounter (Signed)
Left message to call back  

## 2021-01-18 NOTE — Telephone Encounter (Signed)
Pt c/o medication issue:  1. Name of Medication:  sacubitril-valsartan (ENTRESTO) 97-103 MG  2. How are you currently taking this medication (dosage and times per day)?   3. Are you having a reaction (difficulty breathing--STAT)?   4. What is your medication issue?   Patient is requesting to speak with Dr. Thompson Caul nurse regarding patient assistance for [facility].

## 2021-01-18 NOTE — Telephone Encounter (Signed)
He has had 2 small bowel movements since he called earlier, so he is going to do nothing at this time.

## 2021-01-22 NOTE — Telephone Encounter (Signed)
**Note De-Identified  Obfuscation** The pt states that he went to pick up a 90 day supply of his Entresto from [employer] [facility] and that the pharmacist advised him that a 90 day supply will put him in his "donut hole" but that he could get a 60 day supply at normal cost so he did.  He will be in his donut hole in 2 months so he called me to advise that he has already called [employer] and that they are mailing him an application to his home. He wants to know what to do once he completes his part of the application.  I advised him that he can mail, e-mail or bring his application and any documents he wants/needs to add to Dr Darliss Ridgel office at [facility] in [place] and he statrs he prefers to bring it to the office to drop off.  He is aware that we will handle the provider page of the application and will fax all to [employer] pt asst [employer].  He thanked me for calling him back.

## 2021-02-14 ENCOUNTER — Other Ambulatory Visit: Payer: Self-pay | Admitting: Internal Medicine

## 2021-02-14 ENCOUNTER — Other Ambulatory Visit (HOSPITAL_COMMUNITY): Payer: Self-pay

## 2021-02-14 MED ORDER — FUROSEMIDE 40 MG PO TABS
40.0000 mg | ORAL_TABLET | Freq: Every day | ORAL | 3 refills | Status: DC
  Filled 2021-02-14: qty 90, 90d supply, fill #0
  Filled 2021-05-21: qty 90, 90d supply, fill #1
  Filled 2021-08-19: qty 90, 90d supply, fill #2
  Filled 2021-11-19: qty 90, 90d supply, fill #3

## 2021-02-14 MED FILL — Metformin HCl Tab ER 24HR 500 MG: ORAL | 90 days supply | Qty: 180 | Fill #1 | Status: AC

## 2021-02-15 ENCOUNTER — Other Ambulatory Visit (HOSPITAL_COMMUNITY): Payer: Self-pay

## 2021-02-15 ENCOUNTER — Telehealth: Payer: Self-pay | Admitting: Interventional Cardiology

## 2021-02-15 NOTE — Telephone Encounter (Signed)
Pt is reaching out to inform Jeani Hawking or Nurse Anderson Malta that his paperwork for patient assistance has been dropped off and he'd like a call to be notified that it has been received... please advise

## 2021-02-19 NOTE — Telephone Encounter (Signed)
**Note De-Identified  Obfuscation** The pt is advised that we ave his application and that we will fax to [employer] once Dr Tamala Julian signs it which will most likely on Thursday 8/18. He thanked me for calling him with this update.

## 2021-02-20 ENCOUNTER — Telehealth: Payer: Self-pay

## 2021-02-20 NOTE — Telephone Encounter (Signed)
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 I have completed the provider page of the application and have emailed all to Dr Thompson Caul nurse so she can obtain his signature, date it, and to fax to [place] at the fax number written on the cover letter included or to place in the to be faxed basket in Medical Records to be faxed.

## 2021-02-22 NOTE — Telephone Encounter (Signed)
Paperwork completed and faxed.  °

## 2021-03-05 ENCOUNTER — Other Ambulatory Visit (HOSPITAL_COMMUNITY): Payer: Self-pay

## 2021-03-05 ENCOUNTER — Other Ambulatory Visit: Payer: Self-pay | Admitting: Internal Medicine

## 2021-03-05 MED ORDER — MELOXICAM 15 MG PO TABS
15.0000 mg | ORAL_TABLET | Freq: Every day | ORAL | 1 refills | Status: DC
  Filled 2021-03-05: qty 90, 90d supply, fill #0
  Filled 2021-08-19: qty 90, 90d supply, fill #1

## 2021-03-05 MED FILL — Atorvastatin Calcium Tab 10 MG (Base Equivalent): ORAL | 90 days supply | Qty: 90 | Fill #1 | Status: AC

## 2021-03-19 ENCOUNTER — Telehealth: Payer: Medicare Other | Admitting: Internal Medicine

## 2021-03-19 NOTE — Telephone Encounter (Signed)
I called [employer] to check the status of his Pt Asst application and they stated that he was denied due to not meeting the yearly income limit. The medication will cost him $170/mth. He is willing to pay this amount until his next appt with Dr Tamala Julian in November but would like to discuss alternatives with him at that time.     April G.

## 2021-03-19 NOTE — Telephone Encounter (Signed)
Kevyn is calling requesting to speak with Jeani Hawking in regards to this due to never being called back with an update.

## 2021-03-19 NOTE — Telephone Encounter (Signed)
[name] [name] [phone]  [name] called to say has left arm pain from his collar bone to his elbow, some swelling for the last week. He has been rubbing acholol on it and it helps some. It does not hurt when he lays down, hurts mainly when he is sitting up, he try's to keep it elevated that helps some. It looks bigger that right arm to him, would like to come in tomorrow if possible for you to look at it.

## 2021-03-20 ENCOUNTER — Other Ambulatory Visit: Payer: Self-pay | Admitting: Interventional Cardiology

## 2021-03-20 ENCOUNTER — Other Ambulatory Visit (HOSPITAL_COMMUNITY): Payer: Self-pay

## 2021-03-20 MED ORDER — SPIRONOLACTONE 25 MG PO TABS
25.0000 mg | ORAL_TABLET | Freq: Two times a day (BID) | ORAL | 0 refills | Status: DC
  Filled 2021-03-20: qty 180, 90d supply, fill #0

## 2021-03-20 NOTE — Telephone Encounter (Signed)
Called [name] [name] to let him know to call Dr Latanya Maudlin, he verbalized understanding

## 2021-03-20 NOTE — Telephone Encounter (Signed)
Called pt and moved appt up to 9/22 so we can talk about alternatives sooner.  Pt appreciative for call.

## 2021-03-22 ENCOUNTER — Other Ambulatory Visit (HOSPITAL_COMMUNITY): Payer: Self-pay

## 2021-03-26 ENCOUNTER — Other Ambulatory Visit (HOSPITAL_COMMUNITY): Payer: Self-pay

## 2021-03-26 ENCOUNTER — Other Ambulatory Visit: Payer: Self-pay

## 2021-03-26 DIAGNOSIS — M25512 Pain in left shoulder: Secondary | ICD-10-CM | POA: Diagnosis not present

## 2021-03-26 DIAGNOSIS — M542 Cervicalgia: Secondary | ICD-10-CM | POA: Diagnosis not present

## 2021-03-26 DIAGNOSIS — M25511 Pain in right shoulder: Secondary | ICD-10-CM | POA: Diagnosis not present

## 2021-03-26 MED ORDER — TIZANIDINE HCL 2 MG PO TABS
2.0000 mg | ORAL_TABLET | Freq: Three times a day (TID) | ORAL | 0 refills | Status: DC | PRN
  Filled 2021-03-26: qty 30, 10d supply, fill #0

## 2021-03-26 MED ORDER — CELECOXIB 200 MG PO CAPS
200.0000 mg | ORAL_CAPSULE | Freq: Every day | ORAL | 0 refills | Status: DC | PRN
  Filled 2021-03-26: qty 30, 30d supply, fill #0

## 2021-03-28 NOTE — Progress Notes (Signed)
Cardiology Office Note:    Date:  03/29/2021   ID:  ANTHONNY [name], DOB 1951-05-10, MRN [id number]  PCP:  [name] Showers, MD  Cardiologist:  [name] Grooms, MD   Referring MD: [name] Showers, MD   Chief Complaint  Patient presents with   Congestive Heart Failure   Hypertension    Diabetes mellitus II    History of Present Illness:    [name] [name] is a 70 y.o. male with a hx of type 2 diabetes mellitus, hypertension, abdominal atherosclerosis, nonobstructive/minimal atherosclerosis in coronary arteries, severe obesity, and systolic left ventricular dysfunction (EF 35%,6/ 2019 -->60%, 10/2019) on GDMT.   [name] [name] is doing well.  He is not having edema, orthopnea, PND, or episodes of syncope.  There is no peripheral edema.  He is retired after working 2 jobs for greater than 25 years.  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  He got a second job as his children were growing up to help pay for the college education.  He is proud that all 3 have excellent jobs that he thought would not even be a possibility when he was younger.  Having some difficulty affording his current medical regimen Delene Loll).  We tried SGLT2 therapy but he cannot afford it.  Delene Loll is costing $120 per month.  Past Medical History:  Diagnosis Date   Anxiety    Arthritis    Bradycardia    hx of  in 30's medications changed   Diabetes mellitus without complication ([place])    DJD (degenerative joint disease) of knee    Left   Hx of adenomatous colonic polyps 12/2019   2 diminutive   Hypertension     Past Surgical History:  Procedure Laterality Date   CARDIAC CATHETERIZATION  07/23/2018   CHOLECYSTECTOMY N/A 02/04/2016   Procedure: LAPAROSCOPIC CHOLECYSTECTOMY;  Surgeon: Leighton Ruff, MD;  Location: [facility];  Service: General;  Laterality: N/A;   COLONOSCOPY  12/13/2019   ERCP N/A 02/02/2016   Procedure: ENDOSCOPIC RETROGRADE CHOLANGIOPANCREATOGRAPHY (ERCP);  Surgeon:  [name], MD;  Location: [facility];  Service: Endoscopy;  Laterality: N/A;   HERNIA REPAIR     RIGHT/LEFT HEART CATH AND CORONARY ANGIOGRAPHY N/A 07/23/2018   Procedure: RIGHT/LEFT HEART CATH AND CORONARY ANGIOGRAPHY;  Surgeon: Belva Crome, MD;  Location: [facility];  Service: Cardiovascular;  Laterality: N/A;   SPINE SURGERY     TOTAL KNEE ARTHROPLASTY Left 07/28/2018   Procedure: TOTAL KNEE ARTHROPLASTY;  Surgeon: Melrose Nakayama, MD;  Location: [facility];  Service: Orthopedics;  Laterality: Left;   TOTAL KNEE ARTHROPLASTY Right 09/05/2020   Procedure: RIGHT TOTAL KNEE ARTHROPLASTY;  Surgeon: Melrose Nakayama, MD;  Location: [facility];  Service: Orthopedics;  Laterality: Right;    Current Medications: Current Meds  Medication Sig   ALPRAZolam (XANAX) 0.5 MG tablet Take 1 tablet (0.5 mg total) by mouth 2 (two) times daily as needed for anxiety.   amLODipine (NORVASC) 10 MG tablet Take 1 tablet (10 mg total) by mouth daily.   aspirin EC 81 MG tablet Take 1 tablet (81 mg total) by mouth 2 (two) times daily after a meal.   atorvastatin (LIPITOR) 10 MG tablet TAKE 1 TABLET BY MOUTH ONCE DAILY. (Patient taking differently: Take 10 mg by mouth daily.)   carvedilol (COREG) 25 MG tablet Take 1 tablet (25 mg total) by mouth 2 (two) times daily.   esomeprazole (NEXIUM) 40 MG capsule Take  1 capsule (40 mg total) by mouth daily.   furosemide (LASIX) 40 MG tablet Take 1 tablet (40 mg total) by mouth daily.   meloxicam (MOBIC) 15 MG tablet Take 1 tablet (15 mg total) by mouth daily.   metFORMIN (GLUCOPHAGE-XR) 500 MG 24 hr tablet TAKE 1 TABLET BY MOUTH TWICE A DAY (Patient taking differently: Take 500 mg by mouth 2 (two) times daily.)   sacubitril-valsartan (ENTRESTO) 97-103 MG Take 1 tablet by mouth 2 (two) times daily.   spironolactone (ALDACTONE) 25 MG tablet Take 1 tablet (25 mg total) by mouth 2 (two) times daily.   tamsulosin (FLOMAX) 0.4 MG CAPS capsule TAKE 1 CAPSULE (0.4 MG TOTAL) BY  MOUTH DAILY.   tiZANidine (ZANAFLEX) 2 MG tablet Take 1 tablet (2 mg total) by mouth 2 to 3 times daily as needed for muscle spasm   zolpidem (AMBIEN) 5 MG tablet TAKE 1 TABLET (5 MG TOTAL) BY MOUTH AT BEDTIME AS NEEDED FOR SLEEP.     Allergies:   Patient has no known allergies.   Social History   Socioeconomic History   Marital status: Married    Spouse name: Not on file   Number of children: Not on file   Years of education: Not on file   Highest education level: Not on file  Occupational History   Not on file  Tobacco Use   Smoking status: Never   Smokeless tobacco: Never  Vaping Use   Vaping Use: Never used  Substance and Sexual Activity   Alcohol use: No   Drug use: No   Sexual activity: Yes  Other Topics Concern   Not on file  Social History Narrative   Retired from [employer]    Social Determinants of Health   Financial Resource Strain: Not on file  Food Insecurity: Not on file  Transportation Needs: Not on file  Physical Activity: Not on file  Stress: Not on file  Social Connections: Not on file     Family History: The patient's family history includes Hypertension in his mother; Stroke in his mother. There is no history of Colon cancer, Stomach cancer, Rectal cancer, or Pancreatic cancer.  ROS:   Please see the history of present illness.    Financial constraints due to heavy medication regimen.  6 months beyond right knee replacement and doing quite well.  All other systems reviewed and are negative.  EKGs/Labs/Other Studies Reviewed:    The following studies were reviewed today: Echocardiography October 25, 2019: IMPRESSIONS     1. Left ventricular ejection fraction, by estimation, is 55 to 60%. The  left ventricle has normal function. The left ventricle has no regional  wall motion abnormalities. There is mild left ventricular hypertrophy.  Left ventricular diastolic parameters  were normal.   2. Right ventricular systolic function is  normal. The right ventricular  size is normal. There is mildly elevated pulmonary artery systolic  pressure.   3. Left atrial size was moderately dilated.   4. The mitral valve is normal in structure. Trivial mitral valve  regurgitation. No evidence of mitral stenosis.   5. The aortic valve is tricuspid. Aortic valve regurgitation is not  visualized. Mild to moderate aortic valve sclerosis/calcification is  present, without any evidence of aortic stenosis.   6. The inferior vena cava is normal in size with greater than 50%  respiratory variability, suggesting right atrial pressure of 3 mmHg.   EKG:  EKG no new data  Recent Labs: 09/06/2020: Hemoglobin 10.4; Platelets 187;  TSH 0.564 10/26/2020: ALT 10; Magnesium 1.9 01/16/2021: BUN 12; Creatinine, Ser 1.13; Potassium 3.9; Sodium 139  Recent Lipid Panel    Component Value Date/Time   CHOL 128 10/26/2020 0921   TRIG 72 10/26/2020 0921   HDL 53 10/26/2020 0921   CHOLHDL 2.4 10/26/2020 0921   VLDL 14 10/18/2016 1048   LDLCALC 60 10/26/2020 0921    Physical Exam:    VS:  BP (!) 148/76   Pulse 73   Ht 5\' 10"  (1.778 m)   Wt 275 lb 6.4 oz (124.9 kg)   SpO2 98%   BMI 39.52 kg/m     Wt Readings from Last 3 Encounters:  03/29/21 275 lb 6.4 oz (124.9 kg)  12/22/20 268 lb (121.6 kg)  10/27/20 268 lb (121.6 kg)     GEN: Overweight.. No acute distress HEENT: Normal NECK: No JVD. LYMPHATICS: No lymphadenopathy CARDIAC: No murmur. RRR no gallop, or edema. VASCULAR:  Normal Pulses. No bruits. RESPIRATORY:  Clear to auscultation without rales, wheezing or rhonchi  ABDOMEN: Soft, non-tender, non-distended, No pulsatile mass, MUSCULOSKELETAL: No deformity  SKIN: Warm and dry NEUROLOGIC:  Alert and oriented x 3 PSYCHIATRIC:  Normal affect   ASSESSMENT:    1. Chronic combined systolic and diastolic heart failure ([place])   2. Essential hypertension   3. PVC's (premature ventricular contractions)   4. Type 2 diabetes mellitus with  complication, without long-term current use of insulin ([place])   5. Hyperlipidemia associated with type 2 diabetes mellitus ([place])   6. Abdominal aortic atherosclerosis ([facility])    PLAN:    In order of problems listed above:  Triple therapy with spironolactone, Arni, and beta-blocker therapy.  SGLT2 therapy was tried but was not affordable.  Despite financial difficulties, we need to continue triple drug therapy. Low-salt diet and weight loss along with physical activity is recommended on top of current therapy which includes Entresto, Aldactone, furosemide, carvedilol, and Norvasc. Beta-blocker therapy Consider SGLT2 therapy Continue statin therapy. Statin therapy and primary prevention as outlined below.  Overall education and awareness concerning primary risk prevention was discussed in detail: LDL less than 70, hemoglobin A1c less than 7, blood pressure target less than 130/80 mmHg, >150 minutes of moderate aerobic activity per week, avoidance of smoking, weight control (via diet and exercise), and continued surveillance/management of/for obstructive sleep apnea.  All return in 6 months he will need a basic metabolic panel.  Medication Adjustments/Labs and Tests Ordered: Current medicines are reviewed at length with the patient today.  Concerns regarding medicines are outlined above.  No orders of the defined types were placed in this encounter.  No orders of the defined types were placed in this encounter.   Patient Instructions  Medication Instructions:  Your physician recommends that you continue on your current medications as directed. Please refer to the Current Medication list given to you today.  *If you need a refill on your cardiac medications before your next appointment, please call your pharmacy*   Lab Work: None If you have labs (blood work) drawn today and your tests are completely normal, you will receive your results only by: [place] (if you have MyChart)  OR A paper copy in the mail If you have any lab test that is abnormal or we need to change your treatment, we will call you to review the results.   Testing/Procedures: None   Follow-Up: At [facility], you and your health needs are our priority.  As part of our continuing mission to provide  you with exceptional heart care, we have created designated Provider Care Teams.  These Care Teams include your primary Cardiologist (physician) and Advanced Practice Providers (APPs -  Physician Assistants and Nurse Practitioners) who all work together to provide you with the care you need, when you need it.  We recommend signing up for the patient portal called "MyChart".  Sign up information is provided on this After Visit Summary.  MyChart is used to connect with patients for Virtual Visits (Telemedicine).  Patients are able to view lab/test results, encounter notes, upcoming appointments, etc.  Non-urgent messages can be sent to your provider as well.   To learn more about what you can do with MyChart, go to [email or web address].    Your next appointment:   6 month(s)  The format for your next appointment:   In Person  Provider:   You may see [name] Grooms, MD or one of the following Advanced Practice Providers on your designated Care Team:   Cecilie Kicks, NP   Other Instructions     Signed, [name] Grooms, MD  03/29/2021 11:59 AM    [place]

## 2021-03-29 ENCOUNTER — Ambulatory Visit: Payer: Medicare Other | Admitting: Interventional Cardiology

## 2021-03-29 ENCOUNTER — Other Ambulatory Visit: Payer: Self-pay

## 2021-03-29 ENCOUNTER — Encounter: Payer: Self-pay | Admitting: Interventional Cardiology

## 2021-03-29 VITALS — BP 148/76 | HR 73 | Ht 70.0 in | Wt 275.4 lb

## 2021-03-29 DIAGNOSIS — I700 Atherosclerosis of aorta: Secondary | ICD-10-CM | POA: Diagnosis not present

## 2021-03-29 DIAGNOSIS — E1169 Type 2 diabetes mellitus with other specified complication: Secondary | ICD-10-CM | POA: Diagnosis not present

## 2021-03-29 DIAGNOSIS — E785 Hyperlipidemia, unspecified: Secondary | ICD-10-CM | POA: Diagnosis not present

## 2021-03-29 DIAGNOSIS — I5042 Chronic combined systolic (congestive) and diastolic (congestive) heart failure: Secondary | ICD-10-CM

## 2021-03-29 DIAGNOSIS — I10 Essential (primary) hypertension: Secondary | ICD-10-CM

## 2021-03-29 DIAGNOSIS — E118 Type 2 diabetes mellitus with unspecified complications: Secondary | ICD-10-CM

## 2021-03-29 DIAGNOSIS — I493 Ventricular premature depolarization: Secondary | ICD-10-CM | POA: Diagnosis not present

## 2021-03-29 NOTE — Patient Instructions (Signed)
Medication Instructions:  Your physician recommends that you continue on your current medications as directed. Please refer to the Current Medication list given to you today.  *If you need a refill on your cardiac medications before your next appointment, please call your pharmacy*   Lab Work: None If you have labs (blood work) drawn today and your tests are completely normal, you will receive your results only by: MyChart Message (if you have MyChart) OR A paper copy in the mail If you have any lab test that is abnormal or we need to change your treatment, we will call you to review the results.   Testing/Procedures: None   Follow-Up: At [facility], you and your health needs are our priority.  As part of our continuing mission to provide you with exceptional heart care, we have created designated Provider Care Teams.  These Care Teams include your primary Cardiologist (physician) and Advanced Practice Providers (APPs -  Physician Assistants and Nurse Practitioners) who all work together to provide you with the care you need, when you need it.  We recommend signing up for the patient portal called "MyChart".  Sign up information is provided on this After Visit Summary.  MyChart is used to connect with patients for Virtual Visits (Telemedicine).  Patients are able to view lab/test results, encounter notes, upcoming appointments, etc.  Non-urgent messages can be sent to your provider as well.   To learn more about what you can do with MyChart, go to [email or web address].    Your next appointment:   6 month(s)  The format for your next appointment:   In Person  Provider:   You may see Henry W Smith III, MD or one of the following Advanced Practice Providers on your designated Care Team:   Laura Ingold, NP   Other Instructions   

## 2021-04-12 ENCOUNTER — Other Ambulatory Visit: Payer: Self-pay | Admitting: Interventional Cardiology

## 2021-04-12 ENCOUNTER — Other Ambulatory Visit (HOSPITAL_COMMUNITY): Payer: Self-pay

## 2021-04-12 MED FILL — Carvedilol Tab 25 MG: ORAL | 90 days supply | Qty: 180 | Fill #0 | Status: AC

## 2021-04-16 ENCOUNTER — Other Ambulatory Visit (HOSPITAL_COMMUNITY): Payer: Self-pay

## 2021-04-16 ENCOUNTER — Other Ambulatory Visit: Payer: Self-pay | Admitting: Internal Medicine

## 2021-04-16 MED FILL — Zolpidem Tartrate Tab 5 MG: ORAL | 90 days supply | Qty: 90 | Fill #0 | Status: AC

## 2021-04-17 ENCOUNTER — Other Ambulatory Visit (HOSPITAL_COMMUNITY): Payer: Self-pay

## 2021-04-25 DIAGNOSIS — M542 Cervicalgia: Secondary | ICD-10-CM | POA: Diagnosis not present

## 2021-05-01 ENCOUNTER — Other Ambulatory Visit: Payer: Self-pay

## 2021-05-01 ENCOUNTER — Other Ambulatory Visit: Payer: Medicare Other | Admitting: Internal Medicine

## 2021-05-01 DIAGNOSIS — I10 Essential (primary) hypertension: Secondary | ICD-10-CM | POA: Diagnosis not present

## 2021-05-01 DIAGNOSIS — Z125 Encounter for screening for malignant neoplasm of prostate: Secondary | ICD-10-CM

## 2021-05-01 DIAGNOSIS — E7800 Pure hypercholesterolemia, unspecified: Secondary | ICD-10-CM | POA: Diagnosis not present

## 2021-05-01 DIAGNOSIS — E118 Type 2 diabetes mellitus with unspecified complications: Secondary | ICD-10-CM | POA: Diagnosis not present

## 2021-05-02 LAB — COMPLETE METABOLIC PANEL WITH GFR
AG Ratio: 1.5 (calc) (ref 1.0–2.5)
ALT: 12 U/L (ref 9–46)
AST: 14 U/L (ref 10–35)
Albumin: 4.6 g/dL (ref 3.6–5.1)
Alkaline phosphatase (APISO): 73 U/L (ref 35–144)
BUN: 15 mg/dL (ref 7–25)
CO2: 26 mmol/L (ref 20–32)
Calcium: 9.5 mg/dL (ref 8.6–10.3)
Chloride: 101 mmol/L (ref 98–110)
Creat: 1.03 mg/dL (ref 0.70–1.35)
Globulin: 3.1 g/dL (calc) (ref 1.9–3.7)
Glucose, Bld: 125 mg/dL — ABNORMAL HIGH (ref 65–99)
Potassium: 4.5 mmol/L (ref 3.5–5.3)
Sodium: 136 mmol/L (ref 135–146)
Total Bilirubin: 1.4 mg/dL — ABNORMAL HIGH (ref 0.2–1.2)
Total Protein: 7.7 g/dL (ref 6.1–8.1)
eGFR: 79 mL/min/{1.73_m2} (ref >=60)

## 2021-05-02 LAB — CBC WITH DIFFERENTIAL/PLATELET
Absolute Monocytes: 496 cells/uL (ref 200–950)
Basophils Absolute: 38 cells/uL (ref 0–200)
Basophils Relative: 0.9 %
Eosinophils Absolute: 80 cells/uL (ref 15–500)
Eosinophils Relative: 1.9 %
HCT: 41.8 % (ref 38.5–50.0)
Hemoglobin: 13.8 g/dL (ref 13.2–17.1)
Lymphs Abs: 2108 cells/uL (ref 850–3900)
MCH: 31.7 pg (ref 27.0–33.0)
MCHC: 33 g/dL (ref 32.0–36.0)
MCV: 96.1 fL (ref 80.0–100.0)
MPV: 9.9 fL (ref 7.5–12.5)
Monocytes Relative: 11.8 %
Neutro Abs: 1478 cells/uL — ABNORMAL LOW (ref 1500–7800)
Neutrophils Relative %: 35.2 %
Platelets: 253 10*3/uL (ref 140–400)
RBC: 4.35 10*6/uL (ref 4.20–5.80)
RDW: 12.1 % (ref 11.0–15.0)
Total Lymphocyte: 50.2 %
WBC: 4.2 10*3/uL (ref 3.8–10.8)

## 2021-05-02 LAB — PSA: PSA: 0.54 ng/mL (ref <=4.00)

## 2021-05-02 LAB — LIPID PANEL
Cholesterol: 131 mg/dL (ref <200)
HDL: 54 mg/dL (ref >=40)
LDL Cholesterol (Calc): 60 mg/dL (calc)
Non-HDL Cholesterol (Calc): 77 mg/dL (calc) (ref <130)
Total CHOL/HDL Ratio: 2.4 (calc) (ref <5.0)
Triglycerides: 85 mg/dL (ref <150)

## 2021-05-02 LAB — HEMOGLOBIN A1C
Hgb A1c MFr Bld: 6 % of total Hgb — ABNORMAL HIGH (ref <5.7)
Mean Plasma Glucose: 126 mg/dL
eAG (mmol/L): 7 mmol/L

## 2021-05-04 ENCOUNTER — Ambulatory Visit (INDEPENDENT_AMBULATORY_CARE_PROVIDER_SITE_OTHER): Payer: Medicare Other | Admitting: Internal Medicine

## 2021-05-04 ENCOUNTER — Encounter: Payer: Self-pay | Admitting: Internal Medicine

## 2021-05-04 ENCOUNTER — Other Ambulatory Visit: Payer: Self-pay

## 2021-05-04 VITALS — BP 128/72 | HR 64 | Temp 98.6°F | Ht 69.25 in | Wt 257.0 lb

## 2021-05-04 DIAGNOSIS — I10 Essential (primary) hypertension: Secondary | ICD-10-CM | POA: Diagnosis not present

## 2021-05-04 DIAGNOSIS — M1711 Unilateral primary osteoarthritis, right knee: Secondary | ICD-10-CM | POA: Diagnosis not present

## 2021-05-04 DIAGNOSIS — G4709 Other insomnia: Secondary | ICD-10-CM

## 2021-05-04 DIAGNOSIS — E7800 Pure hypercholesterolemia, unspecified: Secondary | ICD-10-CM | POA: Diagnosis not present

## 2021-05-04 DIAGNOSIS — Z0000 Encounter for general adult medical examination without abnormal findings: Secondary | ICD-10-CM

## 2021-05-04 DIAGNOSIS — I5042 Chronic combined systolic (congestive) and diastolic (congestive) heart failure: Secondary | ICD-10-CM | POA: Diagnosis not present

## 2021-05-04 DIAGNOSIS — E1169 Type 2 diabetes mellitus with other specified complication: Secondary | ICD-10-CM

## 2021-05-04 DIAGNOSIS — I700 Atherosclerosis of aorta: Secondary | ICD-10-CM

## 2021-05-04 DIAGNOSIS — Z23 Encounter for immunization: Secondary | ICD-10-CM

## 2021-05-04 DIAGNOSIS — E785 Hyperlipidemia, unspecified: Secondary | ICD-10-CM | POA: Diagnosis not present

## 2021-05-04 LAB — POCT URINALYSIS DIPSTICK
Bilirubin, UA: NEGATIVE
Blood, UA: NEGATIVE
Glucose, UA: NEGATIVE
Ketones, UA: NEGATIVE
Leukocytes, UA: NEGATIVE
Nitrite, UA: NEGATIVE
Protein, UA: NEGATIVE
Spec Grav, UA: 1.01 (ref 1.010–1.025)
Urobilinogen, UA: 0.2 E.U./dL
pH, UA: 5 (ref 5.0–8.0)

## 2021-05-04 NOTE — Progress Notes (Signed)
IElby Showers, MD, have reviewed all documentation for this visit. The documentation on 05/05/21 for the exam, diagnosis, procedures, and orders are all accurate and complete.     Annual Wellness Visit     Patient: [name] [name], Male    DOB: Apr 01, 1951, 70 y.o.   MRN: [id number]  Subjective  Medicare wellness  [name] [name] is a 70 y.o. male who presents today for his Annual Wellness Visit.  HPI Patient has a history of diabetes mellitus, hypertension, obesity, gout, GE reflux, erectile dysfunction.  History of right knee arthroplasty in 2022.  Has been walking and exercising regularly.  Weight has decreased from 274 pounds in October 20 21 to 257 pounds which is excellent.  His lipid panel is normal.  His hemoglobin A1c is excellent at 6%.  Fasting glucose is 125.  BUN and creatinine are normal.  He has Joubert's phenomenon with slight elevation of total bilirubin with normal direct and indirect fractions.  PSA is normal.    Patient Care Team: Elby Showers, MD as PCP - General (Internal Medicine) Belva Crome, MD as PCP - Cardiology (Cardiology)  Review of Systems  Constitutional: Negative.   Respiratory: Negative.    Cardiovascular: Negative.   Gastrointestinal: Negative.   Genitourinary: Negative.   Neurological: Negative.   Psychiatric/Behavioral: Negative.    no new complaints.  Generally feels well.IElby Showers, MD, have reviewed all documentation for this visit. The documentation on 05/04/21 for the exam, diagnosis, procedures, and orders are all accurate and complete.    Objective    Vitals: Ht 5' 9.25" (1.759 m)   Wt 257 lb (116.6 kg)   BMI 37.68 kg/m   Physical Exam Vitals reviewed.  Constitutional:      General: He is not in acute distress. HENT:     Head: Normocephalic and atraumatic.     Right Ear: Tympanic membrane normal.     Left Ear: Tympanic membrane normal.     Mouth/Throat:     Pharynx: Oropharynx is clear.  Eyes:      General: No scleral icterus.    Extraocular Movements: Extraocular movements intact.     Pupils: Pupils are equal, round, and reactive to light.  Neck:     Vascular: No carotid bruit.  Cardiovascular:     Rate and Rhythm: Normal rate and regular rhythm.     Heart sounds: Normal heart sounds. No murmur heard. Pulmonary:     Effort: Pulmonary effort is normal.     Breath sounds: Normal breath sounds. No wheezing.  Abdominal:     Palpations: Abdomen is soft. There is no mass.     Tenderness: There is no abdominal tenderness. There is no guarding.  Musculoskeletal:     Cervical back: Neck supple.     Right lower leg: No edema.  Lymphadenopathy:     Cervical: No cervical adenopathy.  Skin:    General: Skin is warm and dry.  Neurological:     General: No focal deficit present.     Mental Status: He is alert and oriented to person, place, and time.     Cranial Nerves: No cranial nerve deficit.     Sensory: No sensory deficit.     Motor: No weakness.     Coordination: Coordination normal.  Psychiatric:        Mood and Affect: Mood normal.        Behavior: Behavior normal.        Thought Content: Thought  content normal.        Judgment: Judgment normal.     Most recent functional status assessment: In your present state of health, do you have any difficulty performing the following activities: 05/04/2021  Hearing? N  Vision? N  Difficulty concentrating or making decisions? N  Walking or climbing stairs? N  Dressing or bathing? N  Doing errands, shopping? N  Preparing Food and eating ? N  Using the Toilet? N  In the past six months, have you accidently leaked urine? N  Do you have problems with loss of bowel control? N  Managing your Medications? N  Managing your Finances? N  Housekeeping or managing your Housekeeping? N  Some recent data might be hidden   Most recent fall risk assessment: Fall Risk  05/04/2021  Falls in the past year? 0  Number falls in past yr: 0   Injury with Fall? 0  Risk for fall due to : No Fall Risks  Follow up Falls evaluation completed    Most recent depression screenings: PHQ 2/9 Scores 05/04/2021 04/25/2020  PHQ - 2 Score 0 0   Most recent cognitive screening: 6CIT Screen 05/04/2021  What Year? 0 points  What month? 0 points  What time? 0 points  Count back from 20 0 points  Months in reverse 0 points  Repeat phrase 0 points  Total Score 0       Assessment & Plan     Annual wellness visit done today including the all of the following: Reviewed patient's Family Medical History Reviewed and updated list of patient's medical providers Assessment of cognitive impairment was done Assessed patient's functional ability Established a written schedule for health screening [place] Completed and Reviewed  Discussed health benefits of physical activity, and encouraged him to engage in regular exercise appropriate for his age and condition.            Angus Seller, CMA   Presents for health maintenance exam and evaluation of medical issues. Has lost from 274 to 257 pounds.  He has a history of hypertension, GE reflux, musculoskeletal pain, insomnia, glucose intolerance.  Underwent total right knee arthroplasty March 2022.  Left knee arthroplasty 2020.  Followed by Dr. Pernell Dupre with history of combined systolic and diastolic heart failure.  Currently diabetes is well controlled.  In 2008 he weighed 300 pounds, had elevated liver functions and was felt to have fatty liver.  Remote history of gout involving right toes midfoot but not recently.  History of Schatzki's ring dilated by Dr. Carlean Purl in 2008.  Had colonoscopy by Dr. Carlean Purl in 2021.  He had COVID-19 in May 2022 but recovered.  Hospitalized with acute cholecystitis July 2017.  He had ERCP and laparoscopic cholecystectomy.  He has a remote history of gout.  History of erectile dysfunction.  Previous evaluation with  Dr. Pernell Dupre found systolic ejection fraction to be less than or equal to 35%.  He denies dyspnea, syncope, angina.  In April 21 he had improvement in ejection fraction to 55 to 60% and normal left ventricular function with medical management.  He is on Entresto, Lipitor, carvedilol, generic Norvasc and Lasix as well as Aldactone.  Social history: He is married.  3 children, 2 sons and a daughter.  Daughter resides in [place] and works for [employer] as a Freight forwarder.  Son with kidney transplant works as a Product manager here in town.  Wife is retired from the school system.  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  He does not smoke or consume alcohol.  1 son with history of kidney transplant due to hypertension and chronic kidney disease.  Family history: Mother died at age 46 in 64 of a CVA.

## 2021-05-05 NOTE — Patient Instructions (Addendum)
Flu vaccine given.  Labs are stable.  Continue to work with diet and exercise.  Return in 6 months.  Continue current medications.  It was a pleasure to see you today.

## 2021-05-21 ENCOUNTER — Other Ambulatory Visit (HOSPITAL_COMMUNITY): Payer: Self-pay

## 2021-05-21 ENCOUNTER — Other Ambulatory Visit: Payer: Self-pay | Admitting: Internal Medicine

## 2021-05-21 MED ORDER — METFORMIN HCL ER 500 MG PO TB24
500.0000 mg | ORAL_TABLET | Freq: Two times a day (BID) | ORAL | 3 refills | Status: DC
  Filled 2021-05-21: qty 180, 90d supply, fill #0
  Filled 2021-08-19: qty 180, 90d supply, fill #1
  Filled 2021-11-19: qty 180, 90d supply, fill #2
  Filled 2022-02-13 (×2): qty 180, 90d supply, fill #3

## 2021-05-23 ENCOUNTER — Ambulatory Visit: Payer: Medicare Other | Admitting: Interventional Cardiology

## 2021-06-04 ENCOUNTER — Other Ambulatory Visit (HOSPITAL_COMMUNITY): Payer: Self-pay

## 2021-06-04 DIAGNOSIS — M542 Cervicalgia: Secondary | ICD-10-CM | POA: Diagnosis not present

## 2021-06-04 DIAGNOSIS — M545 Low back pain, unspecified: Secondary | ICD-10-CM | POA: Diagnosis not present

## 2021-06-04 MED ORDER — PREDNISONE 5 MG PO TABS
ORAL_TABLET | ORAL | 0 refills | Status: DC
  Filled 2021-06-04: qty 21, 6d supply, fill #0

## 2021-06-04 MED ORDER — TIZANIDINE HCL 2 MG PO TABS
ORAL_TABLET | ORAL | 0 refills | Status: DC
  Filled 2021-06-04: qty 40, 7d supply, fill #0

## 2021-06-04 MED FILL — Atorvastatin Calcium Tab 10 MG (Base Equivalent): ORAL | 90 days supply | Qty: 90 | Fill #2 | Status: AC

## 2021-06-12 ENCOUNTER — Other Ambulatory Visit (HOSPITAL_COMMUNITY): Payer: Self-pay

## 2021-06-12 MED ORDER — PREDNISONE 5 MG PO TABS
ORAL_TABLET | ORAL | 0 refills | Status: DC
  Filled 2021-06-12: qty 21, 6d supply, fill #0

## 2021-06-12 MED ORDER — TIZANIDINE HCL 2 MG PO TABS
ORAL_TABLET | ORAL | 0 refills | Status: DC
  Filled 2021-06-12: qty 40, 7d supply, fill #0

## 2021-06-13 ENCOUNTER — Other Ambulatory Visit (HOSPITAL_COMMUNITY): Payer: Self-pay

## 2021-06-18 ENCOUNTER — Other Ambulatory Visit (HOSPITAL_COMMUNITY): Payer: Self-pay

## 2021-06-18 ENCOUNTER — Other Ambulatory Visit: Payer: Self-pay | Admitting: Interventional Cardiology

## 2021-06-18 MED ORDER — SPIRONOLACTONE 25 MG PO TABS
25.0000 mg | ORAL_TABLET | Freq: Two times a day (BID) | ORAL | 2 refills | Status: DC
  Filled 2021-06-18: qty 180, 90d supply, fill #0
  Filled 2021-09-15: qty 180, 90d supply, fill #1
  Filled 2021-12-05: qty 180, 90d supply, fill #2

## 2021-07-08 HISTORY — PX: CARPAL TUNNEL RELEASE: SHX101

## 2021-07-10 ENCOUNTER — Other Ambulatory Visit (HOSPITAL_COMMUNITY): Payer: Self-pay

## 2021-07-10 ENCOUNTER — Other Ambulatory Visit: Payer: Self-pay | Admitting: Interventional Cardiology

## 2021-07-10 MED FILL — Carvedilol Tab 25 MG: ORAL | 90 days supply | Qty: 180 | Fill #1 | Status: AC

## 2021-07-11 ENCOUNTER — Other Ambulatory Visit (HOSPITAL_COMMUNITY): Payer: Self-pay

## 2021-07-11 DIAGNOSIS — M545 Low back pain, unspecified: Secondary | ICD-10-CM | POA: Diagnosis not present

## 2021-07-11 MED ORDER — TIZANIDINE HCL 2 MG PO TABS
2.0000 mg | ORAL_TABLET | ORAL | 1 refills | Status: DC
  Filled 2021-07-11: qty 40, 7d supply, fill #0
  Filled 2021-08-19: qty 40, 7d supply, fill #1

## 2021-07-11 MED ORDER — ENTRESTO 97-103 MG PO TABS
1.0000 | ORAL_TABLET | Freq: Two times a day (BID) | ORAL | 2 refills | Status: DC
  Filled 2021-07-11 – 2021-07-17 (×2): qty 180, 90d supply, fill #0
  Filled 2021-10-10: qty 180, 90d supply, fill #1
  Filled 2022-01-03: qty 60, 30d supply, fill #2

## 2021-07-17 ENCOUNTER — Other Ambulatory Visit: Payer: Self-pay | Admitting: Internal Medicine

## 2021-07-17 ENCOUNTER — Other Ambulatory Visit (HOSPITAL_COMMUNITY): Payer: Self-pay

## 2021-07-17 MED ORDER — ALPRAZOLAM 0.5 MG PO TABS
0.5000 mg | ORAL_TABLET | Freq: Two times a day (BID) | ORAL | 5 refills | Status: DC | PRN
  Filled 2021-07-17: qty 60, 30d supply, fill #0
  Filled 2021-08-19: qty 60, 30d supply, fill #1
  Filled 2021-09-15: qty 60, 30d supply, fill #2

## 2021-07-17 MED FILL — Zolpidem Tartrate Tab 5 MG: ORAL | 90 days supply | Qty: 90 | Fill #1 | Status: AC

## 2021-08-13 LAB — HM DIABETES EYE EXAM

## 2021-08-20 ENCOUNTER — Other Ambulatory Visit (HOSPITAL_COMMUNITY): Payer: Self-pay

## 2021-09-03 ENCOUNTER — Other Ambulatory Visit: Payer: Self-pay | Admitting: Internal Medicine

## 2021-09-03 MED ORDER — ATORVASTATIN CALCIUM 10 MG PO TABS
ORAL_TABLET | Freq: Every day | ORAL | 3 refills | Status: DC
  Filled 2021-09-03: qty 90, 90d supply, fill #0
  Filled 2021-12-05: qty 90, 90d supply, fill #1
  Filled 2022-03-05: qty 90, 90d supply, fill #2
  Filled 2022-06-03: qty 90, 90d supply, fill #3

## 2021-09-04 ENCOUNTER — Other Ambulatory Visit (HOSPITAL_COMMUNITY): Payer: Self-pay

## 2021-09-06 ENCOUNTER — Other Ambulatory Visit (HOSPITAL_COMMUNITY): Payer: Self-pay

## 2021-09-17 ENCOUNTER — Other Ambulatory Visit (HOSPITAL_COMMUNITY): Payer: Self-pay

## 2021-09-23 NOTE — Progress Notes (Signed)
?Cardiology Office Note:   ? ?Date:  09/26/2021  ? ?ID:  [name] [name], DOB 1951-03-06, MRN [id number] ? ?PCP:  Elby Showers, MD  ?Cardiologist:  Sinclair Grooms, MD  ? ?Referring MD: Elby Showers, MD  ? ?Chief Complaint  ?Patient presents with  ? Congestive Heart Failure  ? Hypertension  ? ? ?History of Present Illness:   ? ?[name] [name] is a 71 y.o. male with a hx of  type 2 diabetes mellitus, hypertension, abdominal atherosclerosis, nonobstructive/minimal atherosclerosis in coronary arteries, severe obesity, and systolic left ventricular dysfunction (EF 35%,6/ 2019 -->60%, 10/2019) on GDMT. ? ? ?He is doing well.  He wonders why he is here today.  He thought he was going to see me again in a year but got notified he needed to come in.  He denies orthopnea, PND, lower extremity edema, syncope, DOE, and angina.  No medication side effects.  He is exercising regularly. ? ?Past Medical History:  ?Diagnosis Date  ? Anxiety   ? Arthritis   ? Bradycardia   ? hx of  in 30's medications changed  ? Diabetes mellitus without complication ([place])   ? DJD (degenerative joint disease) of knee   ? Left  ? Hx of adenomatous colonic polyps 12/2019  ? 2 diminutive  ? Hypertension   ? ? ?Past Surgical History:  ?Procedure Laterality Date  ? CARDIAC CATHETERIZATION  07/23/2018  ? CHOLECYSTECTOMY N/A 02/04/2016  ? Procedure: LAPAROSCOPIC CHOLECYSTECTOMY;  Surgeon: Leighton Ruff, MD;  Location: [facility];  Service: General;  Laterality: N/A;  ? COLONOSCOPY  12/13/2019  ? ERCP N/A 02/02/2016  ? Procedure: ENDOSCOPIC RETROGRADE CHOLANGIOPANCREATOGRAPHY (ERCP);  Surgeon: [name], MD;  Location: [facility];  Service: Endoscopy;  Laterality: N/A;  ? HERNIA REPAIR    ? RIGHT/LEFT HEART CATH AND CORONARY ANGIOGRAPHY N/A 07/23/2018  ? Procedure: RIGHT/LEFT HEART CATH AND CORONARY ANGIOGRAPHY;  Surgeon: Belva Crome, MD;  Location: [facility];  Service: Cardiovascular;  Laterality: N/A;  ? SPINE SURGERY    ? TOTAL KNEE  ARTHROPLASTY Left 07/28/2018  ? Procedure: TOTAL KNEE ARTHROPLASTY;  Surgeon: Melrose Nakayama, MD;  Location: [facility];  Service: Orthopedics;  Laterality: Left;  ? TOTAL KNEE ARTHROPLASTY Right 09/05/2020  ? Procedure: RIGHT TOTAL KNEE ARTHROPLASTY;  Surgeon: Melrose Nakayama, MD;  Location: [facility];  Service: Orthopedics;  Laterality: Right;  ? ? ?Current Medications: ?Current Meds  ?Medication Sig  ? ALPRAZolam (XANAX) 0.5 MG tablet Take 1 tablet (0.5 mg total) by mouth 2 (two) times daily as needed for anxiety.  ? amLODipine (NORVASC) 10 MG tablet Take 1 tablet (10 mg total) by mouth daily.  ? aspirin EC 81 MG tablet Take 1 tablet (81 mg total) by mouth 2 (two) times daily after a meal.  ? atorvastatin (LIPITOR) 10 MG tablet TAKE 1 TABLET BY MOUTH ONCE DAILY.  ? carvedilol (COREG) 25 MG tablet Take 1 tablet (25 mg total) by mouth 2 (two) times daily.  ? esomeprazole (NEXIUM) 40 MG capsule Take 1 capsule (40 mg total) by mouth daily.  ? furosemide (LASIX) 40 MG tablet Take 1 tablet (40 mg total) by mouth daily.  ? meloxicam (MOBIC) 15 MG tablet Take 1 tablet (15 mg total) by mouth daily.  ? metFORMIN (GLUCOPHAGE-XR) 500 MG 24 hr tablet Take 1 tablet (500 mg total) by mouth 2 (two) times daily.  ? predniSONE (DELTASONE) 5 MG tablet Taper dose start with 6 tabs on day 1 then  decrease by 1 tab daily  ? sacubitril-valsartan (ENTRESTO) 97-103 MG Take 1 tablet by mouth 2 (two) times daily.  ? spironolactone (ALDACTONE) 25 MG tablet Take 1 tablet (25 mg total) by mouth 2 (two) times daily.  ? tiZANidine (ZANAFLEX) 2 MG tablet Take 1-2 tablets (2-4 mg total) by mouth 2 to 3 times a day as needed for muscle spasm  ? tiZANidine (ZANAFLEX) 2 MG tablet Take 1-2 tablets (2-4 mg total) by mouth 2-3 (three) times daily as needed for muscle spasm  ? zolpidem (AMBIEN) 5 MG tablet Take 1 tablet (5 mg total) by mouth at bedtime as needed for sleep.  ?  ? ?Allergies:   Patient has no known allergies.  ? ?Social History  ? ?Socioeconomic  History  ? Marital status: Married  ?  Spouse name: Not on file  ? Number of children: Not on file  ? Years of education: Not on file  ? Highest education level: Not on file  ?Occupational History  ? Not on file  ?Tobacco Use  ? Smoking status: Never  ? Smokeless tobacco: Never  ?Vaping Use  ? Vaping Use: Never used  ?Substance and Sexual Activity  ? Alcohol use: No  ? Drug use: No  ? Sexual activity: Yes  ?Other Topics Concern  ? Not on file  ?Social History Narrative  ? Retired from [employer]  ? ?Social Determinants of Health  ? ?Financial Resource Strain: Not on file  ?Food Insecurity: Not on file  ?Transportation Needs: Not on file  ?Physical Activity: Not on file  ?Stress: Not on file  ?Social Connections: Not on file  ?  ? ?Family History: ?The patient's family history includes Hypertension in his mother; Stroke in his mother. There is no history of Colon cancer, Stomach cancer, Rectal cancer, or Pancreatic cancer. ? ?ROS:   ?Please see the history of present illness.    ?Sleeping well.  No medication side effects.  All other systems reviewed and are negative. ? ?EKGs/Labs/Other Studies Reviewed:   ? ?The following studies were reviewed today: ?No new data. ? ?EKG:  EKG sinus rhythm, first-degree AV block, nonspecific T wave flattening.  Compared to September 07, 2020, no changes noted. ? ?Recent Labs: ?10/26/2020: Magnesium 1.9 ?05/01/2021: ALT 12; BUN 15; Creat 1.03; Hemoglobin 13.8; Platelets 253; Potassium 4.5; Sodium 136  ?Recent Lipid Panel ?   ?Component Value Date/Time  ? CHOL 131 05/01/2021 1152  ? TRIG 85 05/01/2021 1152  ? HDL 54 05/01/2021 1152  ? CHOLHDL 2.4 05/01/2021 1152  ? VLDL 14 10/18/2016 1048  ? [place] 60 05/01/2021 1152  ? ? ?Physical Exam:   ? ?VS:  BP 138/86   Pulse 63   Ht 5' 9.25" (1.759 m)   Wt 287 lb 9.6 oz (130.5 kg)   SpO2 99%   BMI 42.16 kg/m?    ? ?Wt Readings from Last 3 Encounters:  ?09/26/21 287 lb 9.6 oz (130.5 kg)  ?05/04/21 257 lb (116.6 kg)  ?03/29/21 275 lb 6.4  oz (124.9 kg)  ?  ? ?GEN: Overweight/obese. No acute distress ?HEENT: Normal ?NECK: No JVD. ?LYMPHATICS: No lymphadenopathy ?CARDIAC: No murmur. RRR S4 but no S3 gallop, or edema. ?VASCULAR:  Normal Pulses. No bruits. ?RESPIRATORY:  Clear to auscultation without rales, wheezing or rhonchi  ?ABDOMEN: Soft, non-tender, non-distended, No pulsatile mass, ?MUSCULOSKELETAL: No deformity  ?SKIN: Warm and dry ?NEUROLOGIC:  Alert and oriented x 3 ?PSYCHIATRIC:  Normal affect  ? ?ASSESSMENT:   ? ?1.  Chronic combined systolic and diastolic heart failure ([place])   ?2. Essential hypertension   ?3. PVC's (premature ventricular contractions)   ?4. Type 2 diabetes mellitus with complication, without long-term current use of insulin ([place])   ?5. Hyperlipidemia associated with type 2 diabetes mellitus ([place])   ?6. Abdominal aortic atherosclerosis ([place])   ? ?PLAN:   ? ?In order of problems listed above: ? ?Guideline directed therapy as tolerated and as currently given including Entresto 97/103 mg tablets twice daily, Aldactone 25 mg 2 times daily, carvedilol 25 mg twice daily.  Basic metabolic panel 2-3 times per year.  Consider SGLT2 therapy at some point in the future but not currently needed given improvement in function and asymptomatic status. ?Continue therapy as listed including amlodipine, Aldactone, furosemide, Entresto, and carvedilol. ?None noted on today's exam and no complaints ?Consider adding SGLT2 therapy. ?Continue Lipitor. ?Continue Lipitor.  Continue blood pressure control. ? ? ?Guideline directed therapy for left ventricular systolic dysfunction: Angiotensin receptor-neprilysin inhibitor (ARNI)-Entresto; beta-blocker therapy - carvedilol, metoprolol succinate, or bisoprolol; mineralocorticoid receptor antagonist (MRA) therapy -spironolactone or eplerenone.  SGLT-2 agents -  Dapagliflozin [place]) or Empagliflozin ([place]).These therapies have been shown to improve clinical outcomes including reduction of  rehospitalization, survival, and acute heart failure. ? ? ? ?Medication Adjustments/Labs and Tests Ordered: ?Current medicines are reviewed at length with the patient today.  Concerns regarding medicines are outlined ab

## 2021-09-24 ENCOUNTER — Other Ambulatory Visit (HOSPITAL_COMMUNITY): Payer: Self-pay

## 2021-09-24 DIAGNOSIS — Z96651 Presence of right artificial knee joint: Secondary | ICD-10-CM | POA: Diagnosis not present

## 2021-09-24 DIAGNOSIS — M25511 Pain in right shoulder: Secondary | ICD-10-CM | POA: Diagnosis not present

## 2021-09-24 DIAGNOSIS — Z96652 Presence of left artificial knee joint: Secondary | ICD-10-CM | POA: Diagnosis not present

## 2021-09-24 MED ORDER — TIZANIDINE HCL 2 MG PO TABS
2.0000 mg | ORAL_TABLET | Freq: Three times a day (TID) | ORAL | 2 refills | Status: DC | PRN
  Filled 2021-09-24: qty 40, 7d supply, fill #0
  Filled 2021-10-16: qty 40, 7d supply, fill #1
  Filled 2021-11-07: qty 40, 7d supply, fill #2

## 2021-09-26 ENCOUNTER — Other Ambulatory Visit: Payer: Self-pay

## 2021-09-26 ENCOUNTER — Ambulatory Visit: Payer: Medicare Other | Admitting: Interventional Cardiology

## 2021-09-26 ENCOUNTER — Encounter: Payer: Self-pay | Admitting: Interventional Cardiology

## 2021-09-26 VITALS — BP 138/86 | HR 63 | Ht 69.25 in | Wt 287.6 lb

## 2021-09-26 DIAGNOSIS — E118 Type 2 diabetes mellitus with unspecified complications: Secondary | ICD-10-CM | POA: Diagnosis not present

## 2021-09-26 DIAGNOSIS — E785 Hyperlipidemia, unspecified: Secondary | ICD-10-CM | POA: Diagnosis not present

## 2021-09-26 DIAGNOSIS — I10 Essential (primary) hypertension: Secondary | ICD-10-CM

## 2021-09-26 DIAGNOSIS — I700 Atherosclerosis of aorta: Secondary | ICD-10-CM | POA: Diagnosis not present

## 2021-09-26 DIAGNOSIS — E1169 Type 2 diabetes mellitus with other specified complication: Secondary | ICD-10-CM

## 2021-09-26 DIAGNOSIS — I493 Ventricular premature depolarization: Secondary | ICD-10-CM | POA: Diagnosis not present

## 2021-09-26 DIAGNOSIS — I5042 Chronic combined systolic (congestive) and diastolic (congestive) heart failure: Secondary | ICD-10-CM | POA: Diagnosis not present

## 2021-09-26 NOTE — Patient Instructions (Signed)
Medication Instructions:  ?Your physician recommends that you continue on your current medications as directed. Please refer to the Current Medication list given to you today. ? ?*If you need a refill on your cardiac medications before your next appointment, please call your pharmacy* ? ? ?Lab Work: ?None ?If you have labs (blood work) drawn today and your tests are completely normal, you will receive your results only by: ?MyChart Message (if you have MyChart) OR ?A paper copy in the mail ?If you have any lab test that is abnormal or we need to change your treatment, we will call you to review the results. ? ? ?Testing/Procedures: ?None ? ? ?Follow-Up: ?At [facility], you and your health needs are our priority.  As part of our continuing mission to provide you with exceptional heart care, we have created designated Provider Care Teams.  These Care Teams include your primary Cardiologist (physician) and Advanced Practice Providers (APPs -  Physician Assistants and Nurse Practitioners) who all work together to provide you with the care you need, when you need it. ? ?We recommend signing up for the patient portal called "MyChart".  Sign up information is provided on this After Visit Summary.  MyChart is used to connect with patients for Virtual Visits (Telemedicine).  Patients are able to view lab/test results, encounter notes, upcoming appointments, etc.  Non-urgent messages can be sent to your provider as well.   ?To learn more about what you can do with MyChart, go to [email or web address].   ? ?Your next appointment:   ?9-12 month(s) ? ?The format for your next appointment:   ?In Person ? ?Provider:   ?Henry W Smith III, MD  ? ? ?Other Instructions ?  ?

## 2021-09-27 ENCOUNTER — Ambulatory Visit (INDEPENDENT_AMBULATORY_CARE_PROVIDER_SITE_OTHER): Payer: Medicare Other | Admitting: Internal Medicine

## 2021-09-27 ENCOUNTER — Encounter: Payer: Self-pay | Admitting: Internal Medicine

## 2021-09-27 ENCOUNTER — Telehealth: Payer: Self-pay | Admitting: Internal Medicine

## 2021-09-27 DIAGNOSIS — F5105 Insomnia due to other mental disorder: Secondary | ICD-10-CM

## 2021-09-27 DIAGNOSIS — F411 Generalized anxiety disorder: Secondary | ICD-10-CM | POA: Diagnosis not present

## 2021-09-27 DIAGNOSIS — F409 Phobic anxiety disorder, unspecified: Secondary | ICD-10-CM

## 2021-09-27 NOTE — Telephone Encounter (Signed)
[name] [name] ?[phone] ? ?[place] called to say he is not sleeping at night, he does not think the medicine he takes is working anymore. He would like to come in today if possible and talk about changing it. ?

## 2021-09-27 NOTE — Telephone Encounter (Signed)
scheduled

## 2021-09-27 NOTE — Progress Notes (Signed)
? ?  Subjective:  ? ? Patient ID: [name] [name], male    DOB: [birth year], 71 y.o.   MRN: [id number] ? ?HPI 71 year old Male seen for discussion around anxiety and insomnia.  He saw Dr. Latanya Maudlin on March 20 for follow-up on knee replacement surgery and right shoulder issue.  Was given an injection in his right shoulder consisting of Xylocaine and betamethasone.  This has improved. ? ?He saw Dr. Pernell Dupre yesterday for follow-up on combined systolic and diastolic heart failure and is doing well.  He has a history of type 2 diabetes mellitus and hypertension.  History of obesity and nonobstructive minimal atherosclerosis in the coronary arteries.  No history of chest pain or shortness of breath and is exercising regularly.  He and his wife both walk. ? ?He has a son who has had a kidney transplant and has been very careful during the pandemic to not expose his son to [place].  Son has not had COVID.  Patient had COVID-19 in May 2022.  He and his wife were frightened as they became ill at the same time but they did well.  They took Paxlovid. ? ?His daughter would like for patient and his wife to go with her on a trip to [place] soon and he is worried about COVID-19. ? ?He has not been sleeping well.  He is somewhat anxious.  He continues to attend church and wears a mask.  He and his wife wear a mask when they walk in case they encounter neighbors that they would like to speak with. ? ? ? ?Review of Systems see above-he does have Xanax 0.5 mg tablets that he has been taking twice daily as needed for anxiety.  He is finding that he takes 1 in the morning and then 1 in the afternoon but then has trouble falling asleep.  He feels it is because he is anxious. ? ?   ?Objective:  ? Physical Exam ?Reviewed vital signs taken by cardiologist yesterday.  Blood pressure was 138/86.  His weight was 287 pounds 9.6 ounces and BMI 42.16 ? ?I spent about 30 minutes speaking with patient about anxiety and he is concerned about  COVID-19 ?His affect is normal but is slightly anxious.  We discussed how to treat anxiety appropriately with Xanax.  I think he needs to increase his dose to 3 times daily. ? ? ?   ?Assessment & Plan:  ?I have suggested that he continue with morning and early afternoon Xanax doses (one half of 0.5 mg tablet to 1 whole tablet) and he will take Xanax 0.5 mg around 9 PM since he tries to go to bed around 10 PM.  He will let me know if this is not working in the next couple of weeks. ? ?Time spent with patient is 30 minutes ? ?

## 2021-09-27 NOTE — Patient Instructions (Signed)
Patient will take 1/2-1 of a Xanax 0.5 mg tablet in the morning and early afternoon and and a whole 0.5 mg tablet around 9 PM.  He will let me know if this is not working over the next couple of weeks. ?

## 2021-10-10 ENCOUNTER — Other Ambulatory Visit (HOSPITAL_COMMUNITY): Payer: Self-pay

## 2021-10-10 ENCOUNTER — Other Ambulatory Visit: Payer: Self-pay | Admitting: Internal Medicine

## 2021-10-10 ENCOUNTER — Telehealth: Payer: Self-pay | Admitting: Internal Medicine

## 2021-10-10 MED ORDER — ALPRAZOLAM 0.5 MG PO TABS
ORAL_TABLET | ORAL | 3 refills | Status: DC
  Filled 2021-10-10 (×2): qty 90, 30d supply, fill #0
  Filled 2021-11-07: qty 90, 30d supply, fill #1
  Filled 2021-12-05: qty 90, 30d supply, fill #2
  Filled 2022-01-03: qty 90, 30d supply, fill #3

## 2021-10-10 MED FILL — Carvedilol Tab 25 MG: ORAL | 90 days supply | Qty: 180 | Fill #2 | Status: AC

## 2021-10-10 NOTE — Telephone Encounter (Signed)
[name] [name] ?[phone] ? ?[name] called to say he is needing a refill on below medication, and you had told him to call because you were going to increase dosage from 2 times a day to 3 times a day. ? ?ALPRAZolam (XANAX) 0.5 MG tablet ? ?[name] Phone:  [phone]  ?Fax:  [phone]  ?  ? ?

## 2021-10-17 ENCOUNTER — Other Ambulatory Visit (HOSPITAL_COMMUNITY): Payer: Self-pay

## 2021-10-26 ENCOUNTER — Other Ambulatory Visit: Payer: Medicare Other

## 2021-10-26 DIAGNOSIS — E7800 Pure hypercholesterolemia, unspecified: Secondary | ICD-10-CM

## 2021-10-26 DIAGNOSIS — E118 Type 2 diabetes mellitus with unspecified complications: Secondary | ICD-10-CM | POA: Diagnosis not present

## 2021-10-29 ENCOUNTER — Ambulatory Visit (INDEPENDENT_AMBULATORY_CARE_PROVIDER_SITE_OTHER): Payer: Medicare Other | Admitting: Internal Medicine

## 2021-10-29 ENCOUNTER — Encounter: Payer: Self-pay | Admitting: Internal Medicine

## 2021-10-29 VITALS — BP 112/82 | HR 67 | Temp 97.6°F | Ht 69.25 in | Wt 283.8 lb

## 2021-10-29 DIAGNOSIS — I10 Essential (primary) hypertension: Secondary | ICD-10-CM

## 2021-10-29 DIAGNOSIS — E785 Hyperlipidemia, unspecified: Secondary | ICD-10-CM

## 2021-10-29 DIAGNOSIS — Z6841 Body Mass Index (BMI) 40.0 and over, adult: Secondary | ICD-10-CM

## 2021-10-29 DIAGNOSIS — I700 Atherosclerosis of aorta: Secondary | ICD-10-CM

## 2021-10-29 DIAGNOSIS — E1169 Type 2 diabetes mellitus with other specified complication: Secondary | ICD-10-CM

## 2021-10-29 DIAGNOSIS — Z8659 Personal history of other mental and behavioral disorders: Secondary | ICD-10-CM

## 2021-10-29 DIAGNOSIS — E7800 Pure hypercholesterolemia, unspecified: Secondary | ICD-10-CM | POA: Diagnosis not present

## 2021-10-29 DIAGNOSIS — I5042 Chronic combined systolic (congestive) and diastolic (congestive) heart failure: Secondary | ICD-10-CM

## 2021-10-29 NOTE — Progress Notes (Signed)
? ?  Subjective:  ? ? Patient ID: [name] [name], male    DOB: 1951/03/04, 71 y.o.   MRN: [id number] ? ?HPI  Here for 6 month recheck. Tetanus vaccine is due in September.  He will need to get that at pharmacy as he is now on Medicare. ? ?He feels well in general.  Much less anxious.  Was having some issues with insomnia due to anxiety in March.  His blood pressure is under excellent control on current regimen.  He saw Dr. Tamala [name], Cardiologist in March.  He has a history of left ventricular dysfunction with ejection fraction of 35% in 2019 improved to greater than 60% in 2021 on GDMT.  Denies shortness of breath chest pain or edema.  He and his wife walk every day.  He has gained some weight.  In June 2022 his BMI was 38.45, in October his BMI was 37.68 and now his BMI is 41.60. ? ?Son who has had kidney transplant is also doing well. ? ?Review of Systems denies chest pain or shortness of breath.  No lower extremity edema.  Feels well and is less anxious. ? ?His glucose is stable at 113, BUN and creatinine are normal and electrolytes are normal.  Hemoglobin A1c 6.1% and was 6% in October 2022.  Lipid panel is normal. ? ?   ?Objective:  ? Physical Exam ?Skin: Warm and dry.  No cervical adenopathy or carotid bruits.  Chest is clear to auscultation without rales or wheezing.  Cardiac exam: Regular rate and rhythm without murmurs or ectopy.  Affect thought and judgment are normal. ? ? ? ?   ?Assessment & Plan:  ?Essential hypertension-stable on current regimen ? ?BMI 41.60 needs to lose weight.  He will continue walking with his wife on a daily basis when weather is fit nd will watch caloric consumption. ? ?Type 2 diabetes mellitus-hemoglobin A1c stable at 6.1% ? ?History of bilateral knee arthroplasties ? ?Continue Entresto per Dr. Daneen [name] for combined systolic and diastolic congestive heart failure.  [name] [name] is expensive.  He also takes Lasix, spironolactone, carvedilol all ? ?History of COVID-23 Nov 2020.  Had  booster November 2022. ? ?Lipid panel stable on low-dose Lipitor ? ?Tetanus immunization due and can be obtained at pharmacy due to Medicare coverage. ? ?Anxiety and insomnia treated with Xanax and Ambien ? ?Musculoskeletal pain treated with Mobic 15 mg daily ? ?Return in November for health maintenance exam and Medicare wellness visit. ? ?

## 2021-10-30 LAB — HEPATIC FUNCTION PANEL
AG Ratio: 1.3 (calc) (ref 1.0–2.5)
ALT: 11 U/L (ref 9–46)
AST: 14 U/L (ref 10–35)
Albumin: 4.4 g/dL (ref 3.6–5.1)
Alkaline phosphatase (APISO): 71 U/L (ref 35–144)
Bilirubin, Direct: 0.3 mg/dL — ABNORMAL HIGH (ref 0.0–0.2)
Globulin: 3.3 g/dL (calc) (ref 1.9–3.7)
Indirect Bilirubin: 1.1 mg/dL (calc) (ref 0.2–1.2)
Total Bilirubin: 1.4 mg/dL — ABNORMAL HIGH (ref 0.2–1.2)
Total Protein: 7.7 g/dL (ref 6.1–8.1)

## 2021-10-30 LAB — ADD ON BMP
BUN: 15 mg/dL (ref 7–25)
CO2: 21 mmol/L (ref 20–32)
Calcium: 9.7 mg/dL (ref 8.6–10.3)
Chloride: 102 mmol/L (ref 98–110)
Creat: 1.18 mg/dL (ref 0.70–1.35)
Glucose, Bld: 113 mg/dL — ABNORMAL HIGH (ref 65–99)
Potassium: 4.4 mmol/L (ref 3.5–5.3)
Sodium: 141 mmol/L (ref 135–146)
eGFR: 67 mL/min/{1.73_m2} (ref >=60)

## 2021-10-30 LAB — LIPID PANEL
Cholesterol: 130 mg/dL (ref <200)
HDL: 52 mg/dL (ref >=40)
LDL Cholesterol (Calc): 62 mg/dL (calc)
Non-HDL Cholesterol (Calc): 78 mg/dL (calc) (ref <130)
Total CHOL/HDL Ratio: 2.5 (calc) (ref <5.0)
Triglycerides: 78 mg/dL (ref <150)

## 2021-10-30 LAB — MICROALBUMIN, URINE: Microalb, Ur: <0.2 mg/dL

## 2021-10-30 LAB — HEMOGLOBIN A1C
Hgb A1c MFr Bld: 6.1 % of total Hgb — ABNORMAL HIGH (ref <5.7)
Mean Plasma Glucose: 128 mg/dL
eAG (mmol/L): 7.1 mmol/L

## 2021-11-03 NOTE — Patient Instructions (Addendum)
It was a pleasure to see you today.  Watch caloric consumption and continue your walking program.  Continue medications per Dr. Daneen Schick.  Tetanus immunization can be obtained at pharmacy due to Medicare guidelines.  Return in November for health maintenance exam and Medicare wellness visit. ?

## 2021-11-07 ENCOUNTER — Other Ambulatory Visit (HOSPITAL_COMMUNITY): Payer: Self-pay

## 2021-11-07 ENCOUNTER — Other Ambulatory Visit: Payer: Self-pay | Admitting: Internal Medicine

## 2021-11-07 MED ORDER — ZOLPIDEM TARTRATE 5 MG PO TABS
5.0000 mg | ORAL_TABLET | Freq: Every evening | ORAL | 1 refills | Status: DC | PRN
  Filled 2021-11-07: qty 90, 90d supply, fill #0
  Filled 2022-02-04: qty 90, 90d supply, fill #1

## 2021-11-08 ENCOUNTER — Other Ambulatory Visit (HOSPITAL_COMMUNITY): Payer: Self-pay

## 2021-11-11 IMAGING — DX DG CHEST 2V
2 series · 2 of 2 positions shown · non-contrast
Comparison: Chest radiograph July 14, 2018

CLINICAL DATA: Preop for right knee replacement

EXAM:
CHEST - 2 VIEW

[chest pa]
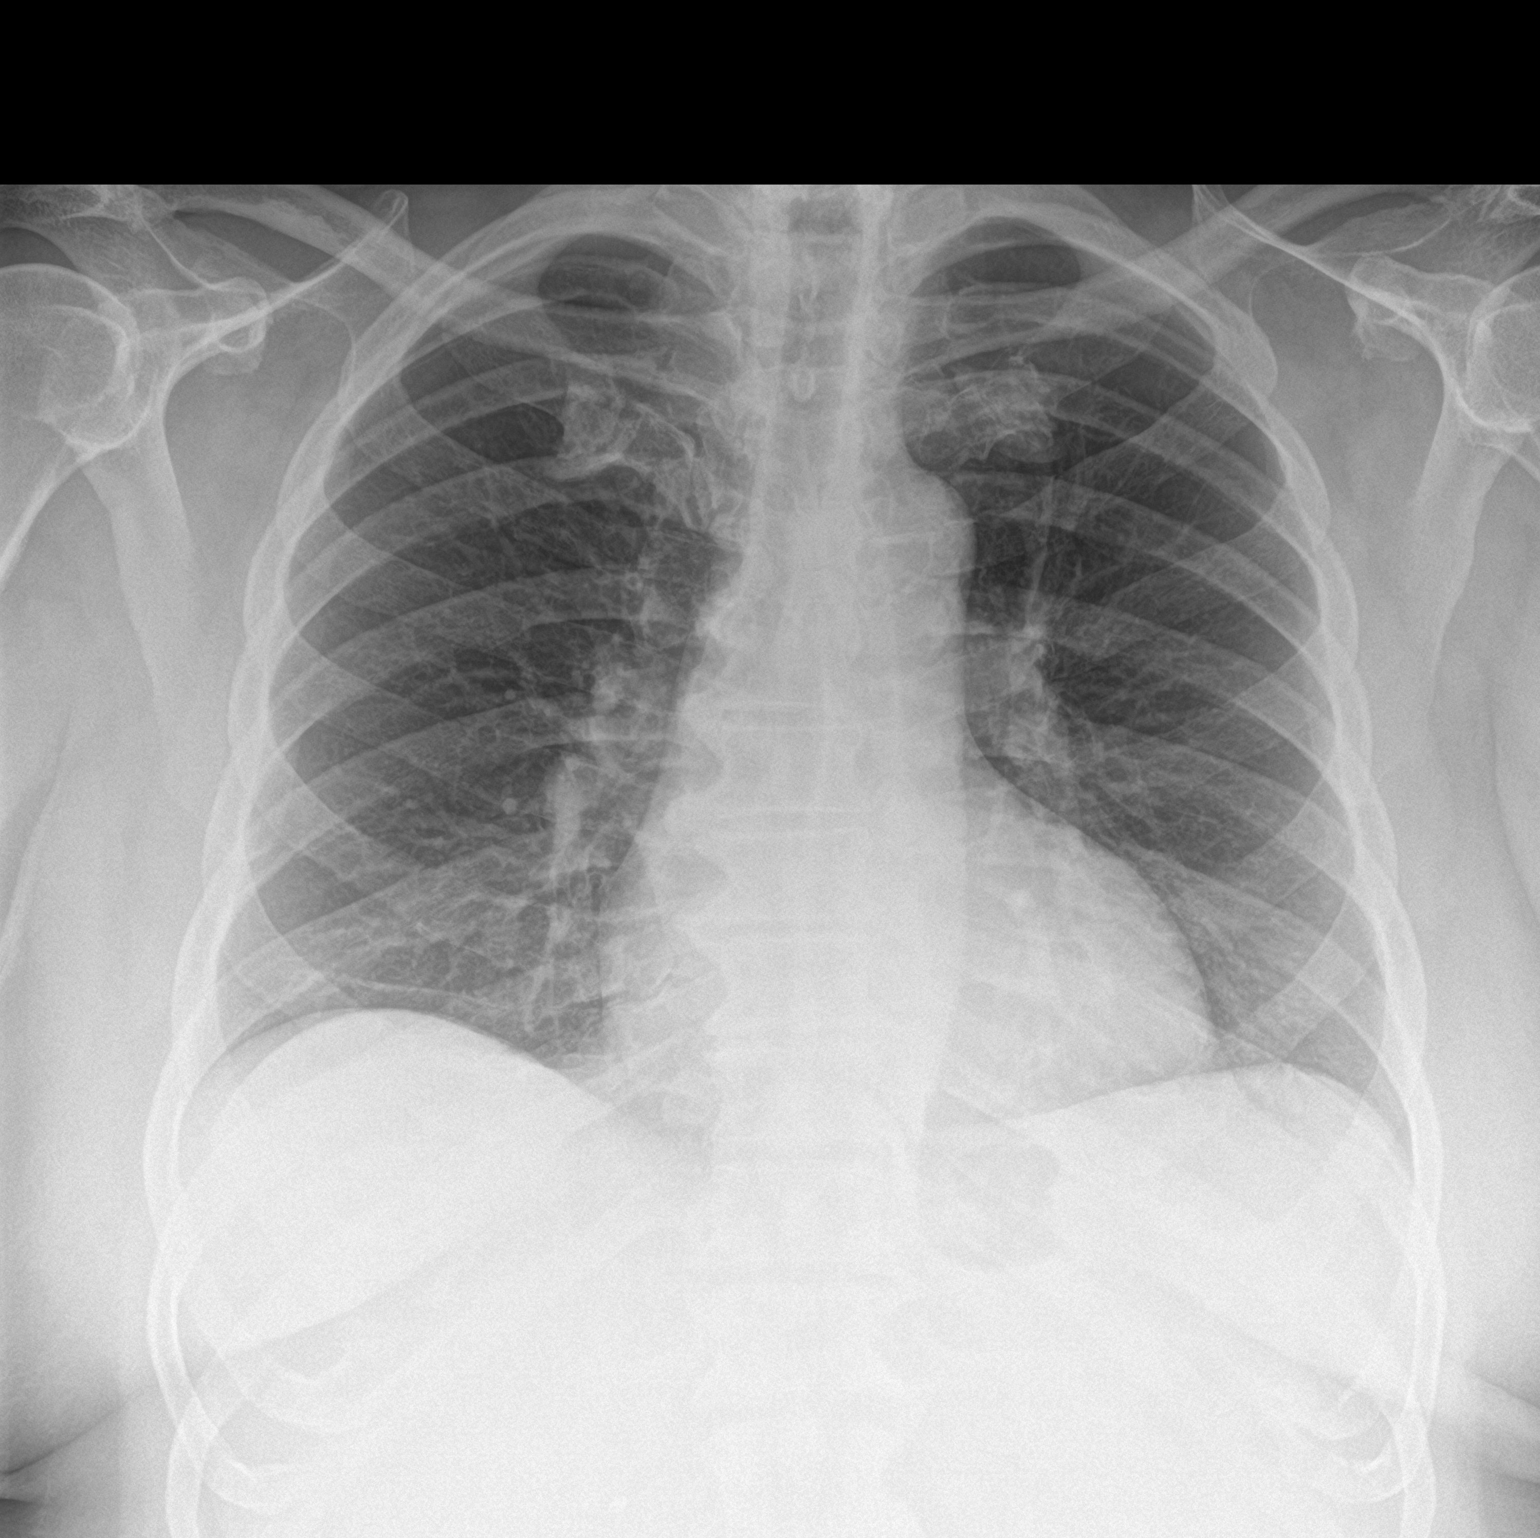

[chest lat]
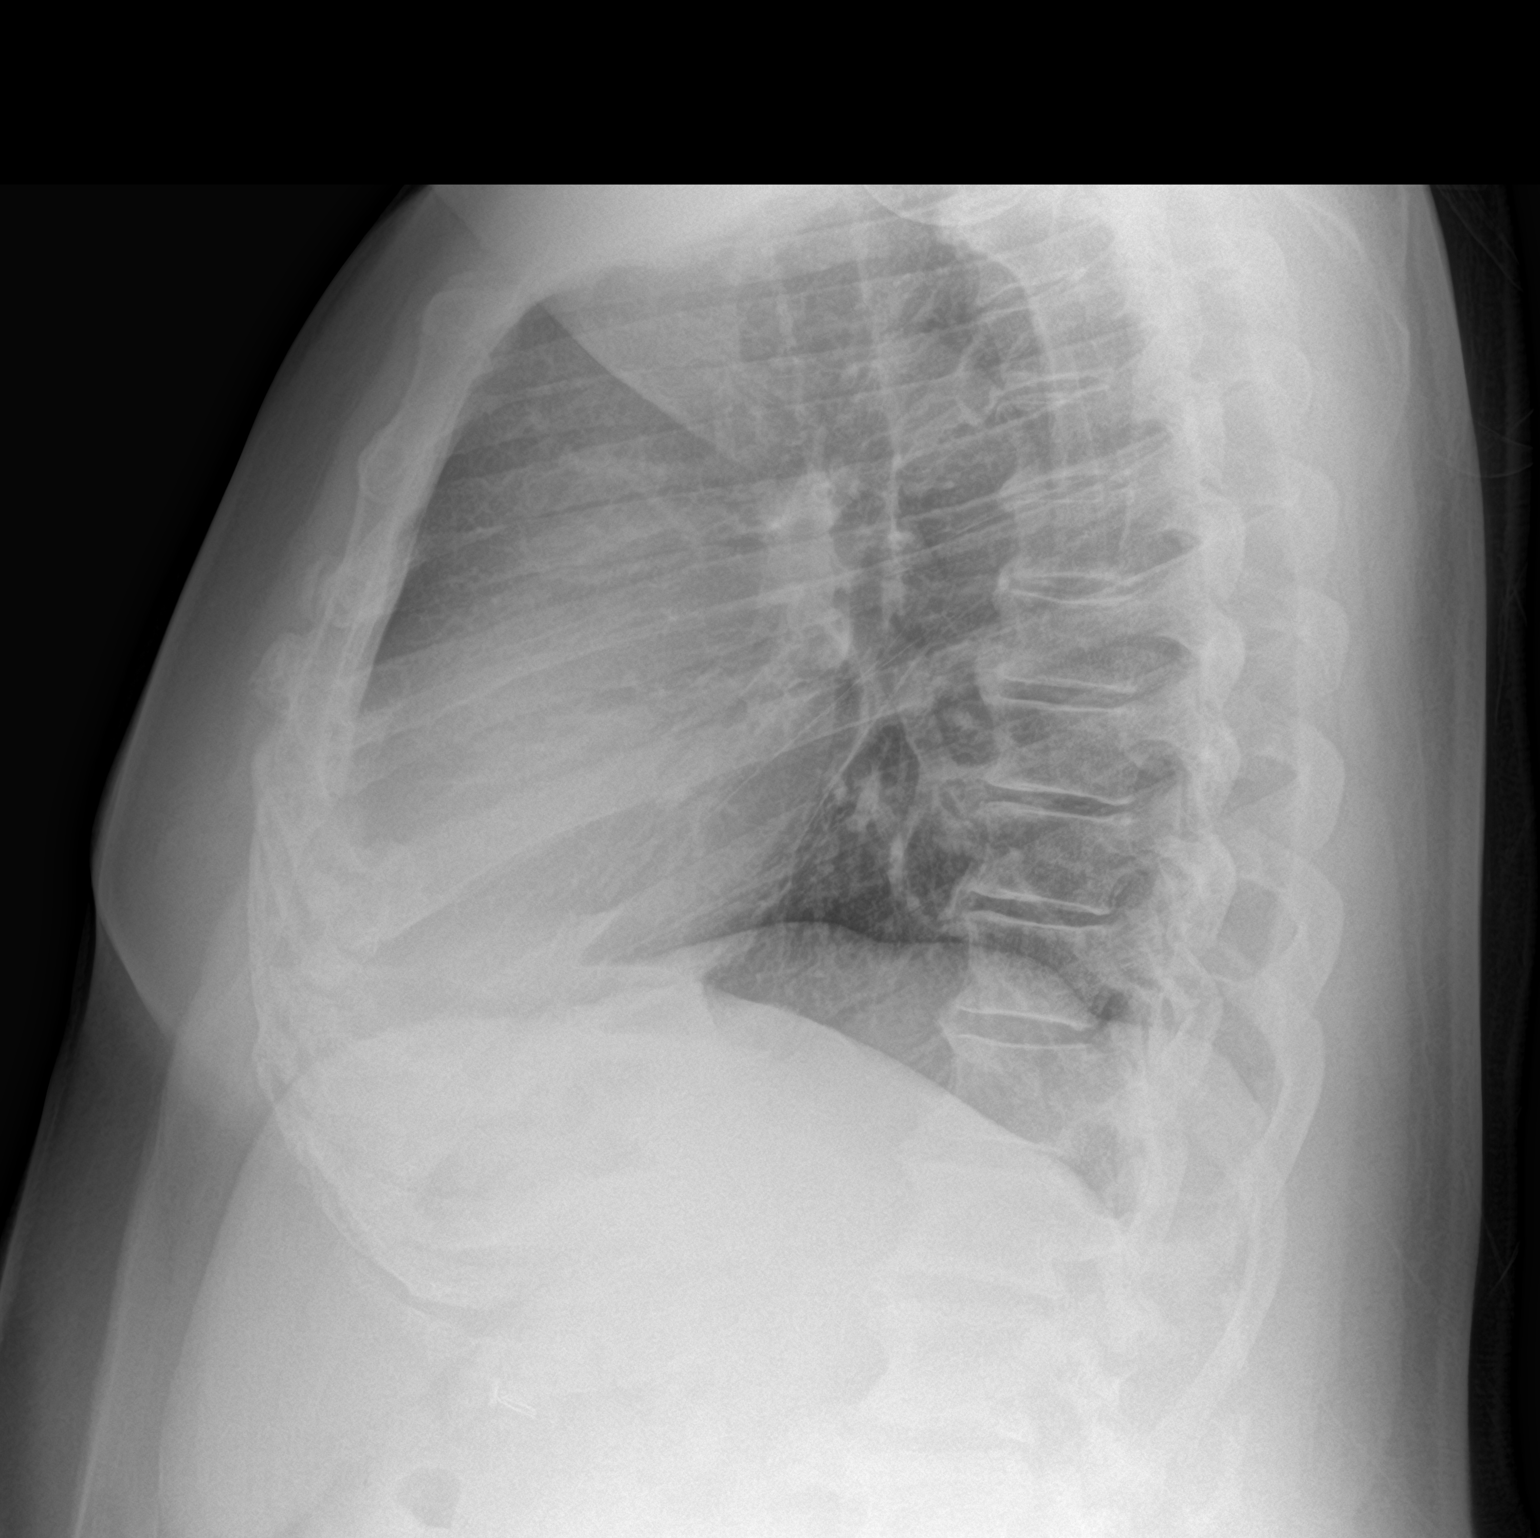

[2 of 2 positions shown; findings below may reference images not displayed]

FINDINGS: The heart size and mediastinal contours are within normal limits.
Both lungs are clear. Partially visualized anterior cervical fusion
hardware. Degenerative changes of the bilateral AC joints.
IMPRESSION: No active cardiopulmonary disease.

## 2021-11-19 ENCOUNTER — Other Ambulatory Visit (HOSPITAL_COMMUNITY): Payer: Self-pay

## 2021-11-20 ENCOUNTER — Other Ambulatory Visit (HOSPITAL_COMMUNITY): Payer: Self-pay

## 2021-11-21 ENCOUNTER — Other Ambulatory Visit (HOSPITAL_COMMUNITY): Payer: Self-pay

## 2021-11-26 DIAGNOSIS — M25511 Pain in right shoulder: Secondary | ICD-10-CM | POA: Diagnosis not present

## 2021-12-03 ENCOUNTER — Emergency Department (HOSPITAL_COMMUNITY): Payer: Medicare Other

## 2021-12-03 ENCOUNTER — Emergency Department (HOSPITAL_COMMUNITY)
Admission: EM | Admit: 2021-12-03 | Discharge: 2021-12-04 | Disposition: A | Discharge status: Home / Self Care | Payer: Medicare Other | Attending: Emergency Medicine | Admitting: Emergency Medicine

## 2021-12-03 ENCOUNTER — Other Ambulatory Visit: Payer: Self-pay

## 2021-12-03 ENCOUNTER — Encounter (HOSPITAL_COMMUNITY): Payer: Self-pay | Admitting: Emergency Medicine

## 2021-12-03 DIAGNOSIS — R0789 Other chest pain: Secondary | ICD-10-CM | POA: Diagnosis not present

## 2021-12-03 DIAGNOSIS — R079 Chest pain, unspecified: Secondary | ICD-10-CM | POA: Diagnosis not present

## 2021-12-03 DIAGNOSIS — Z7982 Long term (current) use of aspirin: Secondary | ICD-10-CM | POA: Insufficient documentation

## 2021-12-03 DIAGNOSIS — E119 Type 2 diabetes mellitus without complications: Secondary | ICD-10-CM | POA: Insufficient documentation

## 2021-12-03 DIAGNOSIS — R600 Localized edema: Secondary | ICD-10-CM | POA: Insufficient documentation

## 2021-12-03 DIAGNOSIS — I110 Hypertensive heart disease with heart failure: Secondary | ICD-10-CM | POA: Diagnosis not present

## 2021-12-03 DIAGNOSIS — Z79899 Other long term (current) drug therapy: Secondary | ICD-10-CM | POA: Diagnosis not present

## 2021-12-03 DIAGNOSIS — I509 Heart failure, unspecified: Secondary | ICD-10-CM | POA: Insufficient documentation

## 2021-12-03 DIAGNOSIS — Z7984 Long term (current) use of oral hypoglycemic drugs: Secondary | ICD-10-CM | POA: Insufficient documentation

## 2021-12-03 LAB — BASIC METABOLIC PANEL
Anion gap: 10 (ref 5–15)
BUN: 16 mg/dL (ref 8–23)
CO2: 23 mmol/L (ref 22–32)
Calcium: 9.3 mg/dL (ref 8.9–10.3)
Chloride: 105 mmol/L (ref 98–111)
Creatinine, Ser: 1.12 mg/dL (ref 0.61–1.24)
GFR, Estimated: >60 mL/min (ref >60)
Glucose, Bld: 121 mg/dL — ABNORMAL HIGH (ref 70–99)
Potassium: 3.8 mmol/L (ref 3.5–5.1)
Sodium: 138 mmol/L (ref 135–145)

## 2021-12-03 LAB — CBC
HCT: 40.7 % (ref 39.0–52.0)
Hemoglobin: 13.6 g/dL (ref 13.0–17.0)
MCH: 32.3 pg (ref 26.0–34.0)
MCHC: 33.4 g/dL (ref 30.0–36.0)
MCV: 96.7 fL (ref 80.0–100.0)
Platelets: 243 10*3/uL (ref 150–400)
RBC: 4.21 MIL/uL — ABNORMAL LOW (ref 4.22–5.81)
RDW: 12.3 % (ref 11.5–15.5)
WBC: 4.8 10*3/uL (ref 4.0–10.5)
nRBC: 0 % (ref 0.0–0.2)

## 2021-12-03 LAB — TROPONIN I (HIGH SENSITIVITY): Troponin I (High Sensitivity): 7 ng/L (ref <18)

## 2021-12-03 NOTE — ED Provider Notes (Signed)
[facility] EMERGENCY DEPARTMENT Provider Note   CSN: [id number] Arrival date & time: 12/03/21  1943     History  Chief Complaint  Patient presents with   Chest Pain    [name] [name] is a 71 y.o. male with a history of HTN, DM, HLD, and CHF presenting to the ED with chest pain.  Patient states that earlier today, he experienced some left-sided chest discomfort that lasted for few minutes and reportedly resolved with burping.  He then had recurrence of this left chest discomfort right before dinner this evening which again lasted for a few minutes and then resolved on its own.  He then came to the ED for further evaluation given it had happened twice today.  Denies any associated nausea, vomiting, abdominal pain, shortness of breath, lightheadedness, diaphoresis.  No recent fevers or cough.  He currently states that the pain is completely resolved and he feels normal.   Chest Pain Associated symptoms: no abdominal pain, no cough, no fever, no nausea, no shortness of breath and no vomiting    HPI: A 71 year old patient with a history of treated diabetes, hypertension, hypercholesterolemia and obesity presents for evaluation of chest pain. Initial onset of pain was approximately 1-3 hours ago. The patient's chest pain is not worse with exertion. The patient's chest pain is middle- or left-sided, is not well-localized, is not described as heaviness/pressure/tightness, is not sharp and does not radiate to the arms/jaw/neck. The patient does not complain of nausea and denies diaphoresis. The patient has no history of stroke, has no history of peripheral artery disease, has not smoked in the past 90 days and has no relevant family history of coronary artery disease (first degree relative at less than age 69).   Home Medications Prior to Admission medications   Medication Sig Start Date End Date Taking? Authorizing Provider  acetaminophen (TYLENOL) 650 MG CR tablet Take 1,300  mg by mouth every 8 (eight) hours as needed for pain.   Yes [provider]  ALPRAZolam Duanne Moron) 0.5 MG tablet Take 0.5-1 tablets (0.25-0.5 mg total) in the morning AND 0.5-1 tablets (0.25-0.5 mg total) in the early afternoon and 1 tablet (0.5 mg total) at bedtime  for anxiety 10/10/21  Yes Baxley, Cresenciano Lick, MD  amLODipine (NORVASC) 10 MG tablet Take 1 tablet (10 mg total) by mouth daily. 11/14/20  Yes Elby Showers, MD  aspirin EC 81 MG tablet Take 1 tablet (81 mg total) by mouth 2 (two) times daily after a meal. 09/05/20  Yes Nida, Mitzi Hansen, PA-C  atorvastatin (LIPITOR) 10 MG tablet TAKE 1 TABLET BY MOUTH ONCE DAILY. Patient taking differently: Take 10 mg by mouth daily. 09/03/21 09/03/22 Yes Baxley, Cresenciano Lick, MD  carvedilol (COREG) 25 MG tablet Take 1 tablet (25 mg total) by mouth 2 (two) times daily. 04/12/21  Yes Belva Crome, MD  esomeprazole (NEXIUM) 40 MG capsule Take 1 capsule (40 mg total) by mouth daily. 12/18/20  Yes Baxley, Cresenciano Lick, MD  furosemide (LASIX) 40 MG tablet Take 1 tablet (40 mg total) by mouth daily. 02/14/21  Yes Baxley, Cresenciano Lick, MD  Magnesium 400 MG TABS Take 400 mg by mouth daily.   Yes [provider]  meloxicam (MOBIC) 15 MG tablet Take 1 tablet (15 mg total) by mouth daily. 03/05/21  Yes Baxley, Cresenciano Lick, MD  metFORMIN (GLUCOPHAGE-XR) 500 MG 24 hr tablet Take 1 tablet (500 mg total) by mouth 2 (two) times daily. 05/21/21  Yes Tedra Senegal  J, MD  sacubitril-valsartan (ENTRESTO) 97-103 MG Take 1 tablet by mouth 2 (two) times daily. 07/11/21  Yes Belva Crome, MD  spironolactone (ALDACTONE) 25 MG tablet Take 1 tablet (25 mg total) by mouth 2 (two) times daily. 06/18/21  Yes Belva Crome, MD  tiZANidine (ZANAFLEX) 2 MG tablet Take 1-2 tablets (2-4 mg total) by mouth 2-3 (three) times daily as needed for muscle spasm 09/24/21  Yes   zolpidem (AMBIEN) 5 MG tablet Take 1 tablet (5 mg total) by mouth at bedtime as needed for sleep. 11/07/21 05/06/22 Yes Baxley, Cresenciano Lick, MD   predniSONE (DELTASONE) 5 MG tablet Taper dose start with 6 tabs on day 1 then decrease by 1 tab daily Patient not taking: Reported on 12/03/2021 06/12/21   Loni Dolly, PA-C      Allergies    Patient has no known allergies.    Review of Systems   Review of Systems  Constitutional:  Negative for fever.  Respiratory:  Negative for cough and shortness of breath.   Cardiovascular:  Positive for chest pain.  Gastrointestinal:  Negative for abdominal pain, nausea and vomiting.  Neurological:  Negative for light-headedness.   Physical Exam Updated Vital Signs BP 125/69   Pulse (!) 56   Temp 98.8 F (37.1 C) (Oral)   Resp (!) 22   Ht '5\' 10"'$  (1.778 m)   Wt (!) 140 kg   SpO2 98%   BMI 44.29 kg/m  Physical Exam Constitutional:      General: He is not in acute distress.    Appearance: He is obese. He is not toxic-appearing or diaphoretic.  HENT:     Head: Normocephalic and atraumatic.  Cardiovascular:     Rate and Rhythm: Normal rate and regular rhythm.     Pulses:          Radial pulses are 2+ on the right side and 2+ on the left side.     Heart sounds: Normal heart sounds. No murmur heard.   No friction rub. No gallop.  Pulmonary:     Effort: Pulmonary effort is normal. No tachypnea or respiratory distress.     Breath sounds: Normal breath sounds. No decreased breath sounds, wheezing, rhonchi or rales.  Chest:     Chest wall: No deformity or tenderness.  Abdominal:     Palpations: Abdomen is soft.     Tenderness: There is no abdominal tenderness. There is no guarding or rebound.  Musculoskeletal:     Cervical back: Neck supple.     Right lower leg: No tenderness. Edema present.     Left lower leg: No tenderness. Edema present.  Skin:    General: Skin is warm and dry.  Neurological:     General: No focal deficit present.     Mental Status: He is alert and oriented to person, place, and time.    ED Results / Procedures / Treatments   Labs (all labs ordered are listed,  but only abnormal results are displayed) Labs Reviewed  BASIC METABOLIC PANEL - Abnormal; Notable for the following components:      Result Value   Glucose, Bld 121 (*)    All other components within normal limits  CBC - Abnormal; Notable for the following components:   RBC 4.21 (*)    All other components within normal limits  TROPONIN I (HIGH SENSITIVITY)  TROPONIN I (HIGH SENSITIVITY)    EKG EKG Interpretation  Date/Time:  Monday Dec 03 2021 21:06:15 EDT Ventricular Rate:  69 PR Interval:  228 QRS Duration: 116 QT Interval:  400 QTC Calculation: 429 R Axis:   32 Text Interpretation: Sinus rhythm Prolonged PR interval Nonspecific intraventricular conduction delay Low voltage, precordial leads No significant change since last tracing Confirmed by Gareth Morgan [phone]) on 12/03/2021 9:25:47 PM  Radiology DG Chest 2 View  Result Date: 12/03/2021 CLINICAL DATA:  Chest pain EXAM: CHEST - 2 VIEW COMPARISON:  12/22/2020 FINDINGS: Heart and mediastinal contours are within normal limits. No focal opacities or effusions. No acute bony abnormality. IMPRESSION: No active cardiopulmonary disease. Electronically Signed   By: Rolm Baptise M.D.   On: 12/03/2021 20:31    Procedures Procedures    Medications Ordered in ED Medications - No data to display  ED Course/ Medical Decision Making/ A&P   HEAR Score: 5                       Medical Decision Making Amount and/or Complexity of Data Reviewed Labs: ordered. Radiology: ordered.    SUN WILENSKY is a 71 y.o. male with a history of HTN, DM, HLD, and CHF presenting to the ED with chest pain.  On exam, the patient is afebrile and hemodynamically stable.  Cardiac and pulmonary exam are within normal limits.  He currently denies any chest pain.  Overall, patient is describing atypical chest pain.  However, will rule out ACS with troponin x2.  Other differentials include musculoskeletal pain, GERD, pneumonia.  ECG shows a sinus  rhythm without acute ischemic changes including no ST elevations or depressions.  He does have some nonspecific T wave changes laterally, but appears improved from prior (previous ECGs showed T wave inversions in the lateral leads).  Otherwise largely unchanged from prior.  Chest X-ray did not show any acute abnormalities, including no effusions or pulmonary edema.  CBC is unremarkable.  BMP is within normal limits.  Initial troponin is 7.  Repeat troponin is pending at the time of sign out. Pt has not had recurrence of chest pain while in the ED. Pt's HEAR score is elevated to 5.  He already follows with cardiology, so if repeat troponin is negative, he can follow-up outpatient with Dr. Tamala Julian.  Patient's care signed out to Dr. Sedonia Small '@2300'$ .        Final Clinical Impression(s) / ED Diagnoses Final diagnoses:  Chest pain, unspecified type    Rx / DC Orders ED Discharge Orders     None         Sondra Come, MD 12/03/21 [id number]    Gareth Morgan, MD 12/04/21 1242

## 2021-12-03 NOTE — ED Provider Notes (Signed)
  Provider Note MRN:  [id number]  Arrival date & time: 12/04/21    ED Course and Medical Decision Making  Assumed care from Dr. Billy Fischer at shift change.  Brief chest pain resolved with belching, awaiting second troponin, plan is for discharge thereafter if reassuring.  Procedures  Final Clinical Impressions(s) / ED Diagnoses     ICD-10-CM   1. Chest pain, unspecified type  R07.9       ED Discharge Orders     None         Discharge Instructions      You work-up today including blood work, EKG, and a chest x-ray was reassuring today. Please follow-up with Dr. Tamala Julian (Cardiology) as soon as possible. Please return to the Emergency Department if you develop severe chest pain, shortness of breath, lightheadedness, or anything other concerning symptoms.    Barth Kirks. Sedonia Small, [place] [email or web address]    Maudie Flakes, MD 12/04/21 [phone]

## 2021-12-03 NOTE — Discharge Instructions (Signed)
You work-up today including blood work, EKG, and a chest x-ray was reassuring today. Please follow-up with Dr. Tamala Julian (Cardiology) as soon as possible. Please return to the Emergency Department if you develop severe chest pain, shortness of breath, lightheadedness, or anything other concerning symptoms.

## 2021-12-03 NOTE — ED Triage Notes (Signed)
Patient reports intermittent left chest pain today with mild SOB , no emesis or diaphoresis , denies cough or fever .

## 2021-12-03 NOTE — ED Provider Triage Note (Signed)
Emergency Medicine Provider Triage Evaluation Note  [name] [name] , a 71 y.o. male  was evaluated in triage.  Pt complains of intermittent chest pain today. He reports he had left side chest pain this AM and drank some gingerale and his CP was relieved with belching. Later on around 1700-1800 he had the chest pain again, but didn't try the gingerale again and wanted to be checked out. Denies any SOB, nausea, vomiting, diaphoresis, abdominal pain, or fevers.   Review of Systems  Positive:  Negative:   Physical Exam  BP (!) 146/76   Pulse 75   Temp 98.8 F (37.1 C) (Oral)   Resp 18   Ht '5\' 10"'$  (1.778 m)   Wt (!) 140 kg   SpO2 95%   BMI 44.29 kg/m  Gen:   Awake, no distress   Resp:  Normal effort  MSK:   Moves extremities without difficulty  Other:  RRR. CTAB.  Medical Decision Making  Medically screening exam initiated at 8:12 PM.  Appropriate orders placed.  AKITO BOOMHOWER was informed that the remainder of the evaluation will be completed by another provider, this initial triage assessment does not replace that evaluation, and the importance of remaining in the ED until their evaluation is complete.  Sounds like GERD, but will order cardiac workup given age. VSS.    Sherrell Puller, PA-C 12/03/21 2014

## 2021-12-04 LAB — TROPONIN I (HIGH SENSITIVITY): Troponin I (High Sensitivity): 9 ng/L (ref <18)

## 2021-12-05 ENCOUNTER — Other Ambulatory Visit: Payer: Self-pay | Admitting: Internal Medicine

## 2021-12-05 ENCOUNTER — Other Ambulatory Visit (HOSPITAL_COMMUNITY): Payer: Self-pay

## 2021-12-05 MED ORDER — ESOMEPRAZOLE MAGNESIUM 40 MG PO CPDR
40.0000 mg | DELAYED_RELEASE_CAPSULE | Freq: Every day | ORAL | 3 refills | Status: DC
  Filled 2021-12-05: qty 90, 90d supply, fill #0
  Filled 2022-03-05: qty 90, 90d supply, fill #1
  Filled 2022-06-03: qty 90, 90d supply, fill #2
  Filled 2022-09-02: qty 90, 90d supply, fill #3

## 2021-12-05 MED ORDER — AMLODIPINE BESYLATE 10 MG PO TABS
10.0000 mg | ORAL_TABLET | Freq: Every day | ORAL | 3 refills | Status: DC
  Filled 2021-12-05: qty 90, 90d supply, fill #0
  Filled 2022-03-05: qty 90, 90d supply, fill #1
  Filled 2022-06-03: qty 90, 90d supply, fill #2
  Filled 2022-09-02: qty 90, 90d supply, fill #3

## 2021-12-05 MED ORDER — MELOXICAM 15 MG PO TABS
15.0000 mg | ORAL_TABLET | Freq: Every day | ORAL | 1 refills | Status: DC
  Filled 2021-12-05: qty 90, 90d supply, fill #0
  Filled 2022-05-03: qty 90, 90d supply, fill #1

## 2021-12-06 ENCOUNTER — Other Ambulatory Visit (HOSPITAL_COMMUNITY): Payer: Self-pay

## 2021-12-07 ENCOUNTER — Other Ambulatory Visit (HOSPITAL_COMMUNITY): Payer: Self-pay

## 2021-12-07 DIAGNOSIS — G5603 Carpal tunnel syndrome, bilateral upper limbs: Secondary | ICD-10-CM | POA: Diagnosis not present

## 2021-12-12 DIAGNOSIS — G5601 Carpal tunnel syndrome, right upper limb: Secondary | ICD-10-CM | POA: Diagnosis not present

## 2021-12-12 DIAGNOSIS — G5603 Carpal tunnel syndrome, bilateral upper limbs: Secondary | ICD-10-CM | POA: Diagnosis not present

## 2021-12-12 DIAGNOSIS — G5602 Carpal tunnel syndrome, left upper limb: Secondary | ICD-10-CM | POA: Diagnosis not present

## 2021-12-19 DIAGNOSIS — G5601 Carpal tunnel syndrome, right upper limb: Secondary | ICD-10-CM | POA: Diagnosis not present

## 2021-12-24 ENCOUNTER — Other Ambulatory Visit (HOSPITAL_COMMUNITY): Payer: Self-pay

## 2021-12-27 ENCOUNTER — Other Ambulatory Visit (HOSPITAL_COMMUNITY): Payer: Self-pay

## 2021-12-27 DIAGNOSIS — G5601 Carpal tunnel syndrome, right upper limb: Secondary | ICD-10-CM | POA: Diagnosis not present

## 2021-12-28 ENCOUNTER — Other Ambulatory Visit (HOSPITAL_COMMUNITY): Payer: Self-pay

## 2021-12-28 MED ORDER — HYDROCODONE-ACETAMINOPHEN 5-325 MG PO TABS
1.0000 | ORAL_TABLET | Freq: Four times a day (QID) | ORAL | 0 refills | Status: DC | PRN
  Filled 2021-12-28: qty 10, 2d supply, fill #0

## 2022-01-03 ENCOUNTER — Other Ambulatory Visit (HOSPITAL_COMMUNITY): Payer: Self-pay

## 2022-01-03 MED FILL — Carvedilol Tab 25 MG: ORAL | 90 days supply | Qty: 180 | Fill #3 | Status: AC

## 2022-01-04 DIAGNOSIS — G5602 Carpal tunnel syndrome, left upper limb: Secondary | ICD-10-CM | POA: Diagnosis not present

## 2022-01-21 ENCOUNTER — Other Ambulatory Visit (HOSPITAL_COMMUNITY): Payer: Self-pay

## 2022-01-21 MED ORDER — TRAMADOL HCL 50 MG PO TABS
50.0000 mg | ORAL_TABLET | ORAL | 0 refills | Status: DC
  Filled 2022-01-21: qty 30, 10d supply, fill #0

## 2022-01-23 DIAGNOSIS — H4311 Vitreous hemorrhage, right eye: Secondary | ICD-10-CM | POA: Diagnosis not present

## 2022-01-25 DIAGNOSIS — G5601 Carpal tunnel syndrome, right upper limb: Secondary | ICD-10-CM | POA: Diagnosis not present

## 2022-01-29 DIAGNOSIS — G5601 Carpal tunnel syndrome, right upper limb: Secondary | ICD-10-CM | POA: Diagnosis not present

## 2022-01-30 DIAGNOSIS — G5601 Carpal tunnel syndrome, right upper limb: Secondary | ICD-10-CM | POA: Diagnosis not present

## 2022-02-01 DIAGNOSIS — G5601 Carpal tunnel syndrome, right upper limb: Secondary | ICD-10-CM | POA: Diagnosis not present

## 2022-02-04 ENCOUNTER — Other Ambulatory Visit: Payer: Self-pay | Admitting: Interventional Cardiology

## 2022-02-04 ENCOUNTER — Other Ambulatory Visit: Payer: Self-pay | Admitting: Internal Medicine

## 2022-02-04 ENCOUNTER — Other Ambulatory Visit (HOSPITAL_COMMUNITY): Payer: Self-pay

## 2022-02-04 DIAGNOSIS — G5601 Carpal tunnel syndrome, right upper limb: Secondary | ICD-10-CM | POA: Diagnosis not present

## 2022-02-04 MED ORDER — ALPRAZOLAM 0.5 MG PO TABS
ORAL_TABLET | ORAL | 3 refills | Status: DC
  Filled 2022-02-04: qty 90, 30d supply, fill #0
  Filled 2022-03-05 – 2022-03-07 (×2): qty 90, 30d supply, fill #1
  Filled 2022-04-04: qty 90, 30d supply, fill #2
  Filled 2022-05-03: qty 90, 30d supply, fill #3

## 2022-02-04 MED ORDER — TRAMADOL HCL 50 MG PO TABS
50.0000 mg | ORAL_TABLET | ORAL | 0 refills | Status: DC | PRN
  Filled 2022-02-04: qty 30, 10d supply, fill #0

## 2022-02-05 ENCOUNTER — Other Ambulatory Visit (HOSPITAL_COMMUNITY): Payer: Self-pay

## 2022-02-05 MED ORDER — ENTRESTO 97-103 MG PO TABS
1.0000 | ORAL_TABLET | Freq: Two times a day (BID) | ORAL | 2 refills | Status: DC
  Filled 2022-02-05: qty 180, 90d supply, fill #0
  Filled 2022-06-03: qty 60, 30d supply, fill #1
  Filled 2022-07-09: qty 180, 90d supply, fill #2

## 2022-02-06 DIAGNOSIS — G5601 Carpal tunnel syndrome, right upper limb: Secondary | ICD-10-CM | POA: Diagnosis not present

## 2022-02-08 DIAGNOSIS — G5601 Carpal tunnel syndrome, right upper limb: Secondary | ICD-10-CM | POA: Diagnosis not present

## 2022-02-11 DIAGNOSIS — G5601 Carpal tunnel syndrome, right upper limb: Secondary | ICD-10-CM | POA: Diagnosis not present

## 2022-02-13 ENCOUNTER — Other Ambulatory Visit (HOSPITAL_COMMUNITY): Payer: Self-pay

## 2022-02-13 ENCOUNTER — Other Ambulatory Visit: Payer: Self-pay | Admitting: Internal Medicine

## 2022-02-13 DIAGNOSIS — G5601 Carpal tunnel syndrome, right upper limb: Secondary | ICD-10-CM | POA: Diagnosis not present

## 2022-02-13 MED ORDER — FUROSEMIDE 40 MG PO TABS
40.0000 mg | ORAL_TABLET | Freq: Every day | ORAL | 3 refills | Status: DC
  Filled 2022-02-13: qty 90, 90d supply, fill #0
  Filled 2022-05-16: qty 90, 90d supply, fill #1
  Filled 2022-09-02: qty 90, 90d supply, fill #2
  Filled 2022-11-27: qty 90, 90d supply, fill #3

## 2022-02-14 ENCOUNTER — Other Ambulatory Visit (HOSPITAL_COMMUNITY): Payer: Self-pay

## 2022-02-14 DIAGNOSIS — G5602 Carpal tunnel syndrome, left upper limb: Secondary | ICD-10-CM | POA: Diagnosis not present

## 2022-02-14 MED ORDER — HYDROCODONE-ACETAMINOPHEN 5-325 MG PO TABS
1.0000 | ORAL_TABLET | Freq: Four times a day (QID) | ORAL | 0 refills | Status: DC | PRN
  Filled 2022-02-14: qty 10, 2d supply, fill #0

## 2022-02-20 DIAGNOSIS — G5601 Carpal tunnel syndrome, right upper limb: Secondary | ICD-10-CM | POA: Diagnosis not present

## 2022-02-22 ENCOUNTER — Other Ambulatory Visit (HOSPITAL_COMMUNITY): Payer: Self-pay

## 2022-02-22 MED ORDER — TRAMADOL HCL 50 MG PO TABS
50.0000 mg | ORAL_TABLET | ORAL | 0 refills | Status: DC
  Filled 2022-02-22: qty 30, 10d supply, fill #0

## 2022-03-05 ENCOUNTER — Other Ambulatory Visit: Payer: Self-pay | Admitting: Interventional Cardiology

## 2022-03-05 DIAGNOSIS — G5601 Carpal tunnel syndrome, right upper limb: Secondary | ICD-10-CM | POA: Diagnosis not present

## 2022-03-05 IMAGING — CR DG CHEST 2V
2 series · 2 of 2 positions shown · non-contrast
Comparison: Radiographs 08/30/2020

CLINICAL DATA: Continued cough since COVID 19 infection 1 month
ago.

EXAM:
CHEST - 2 VIEW

[w chest pa *]
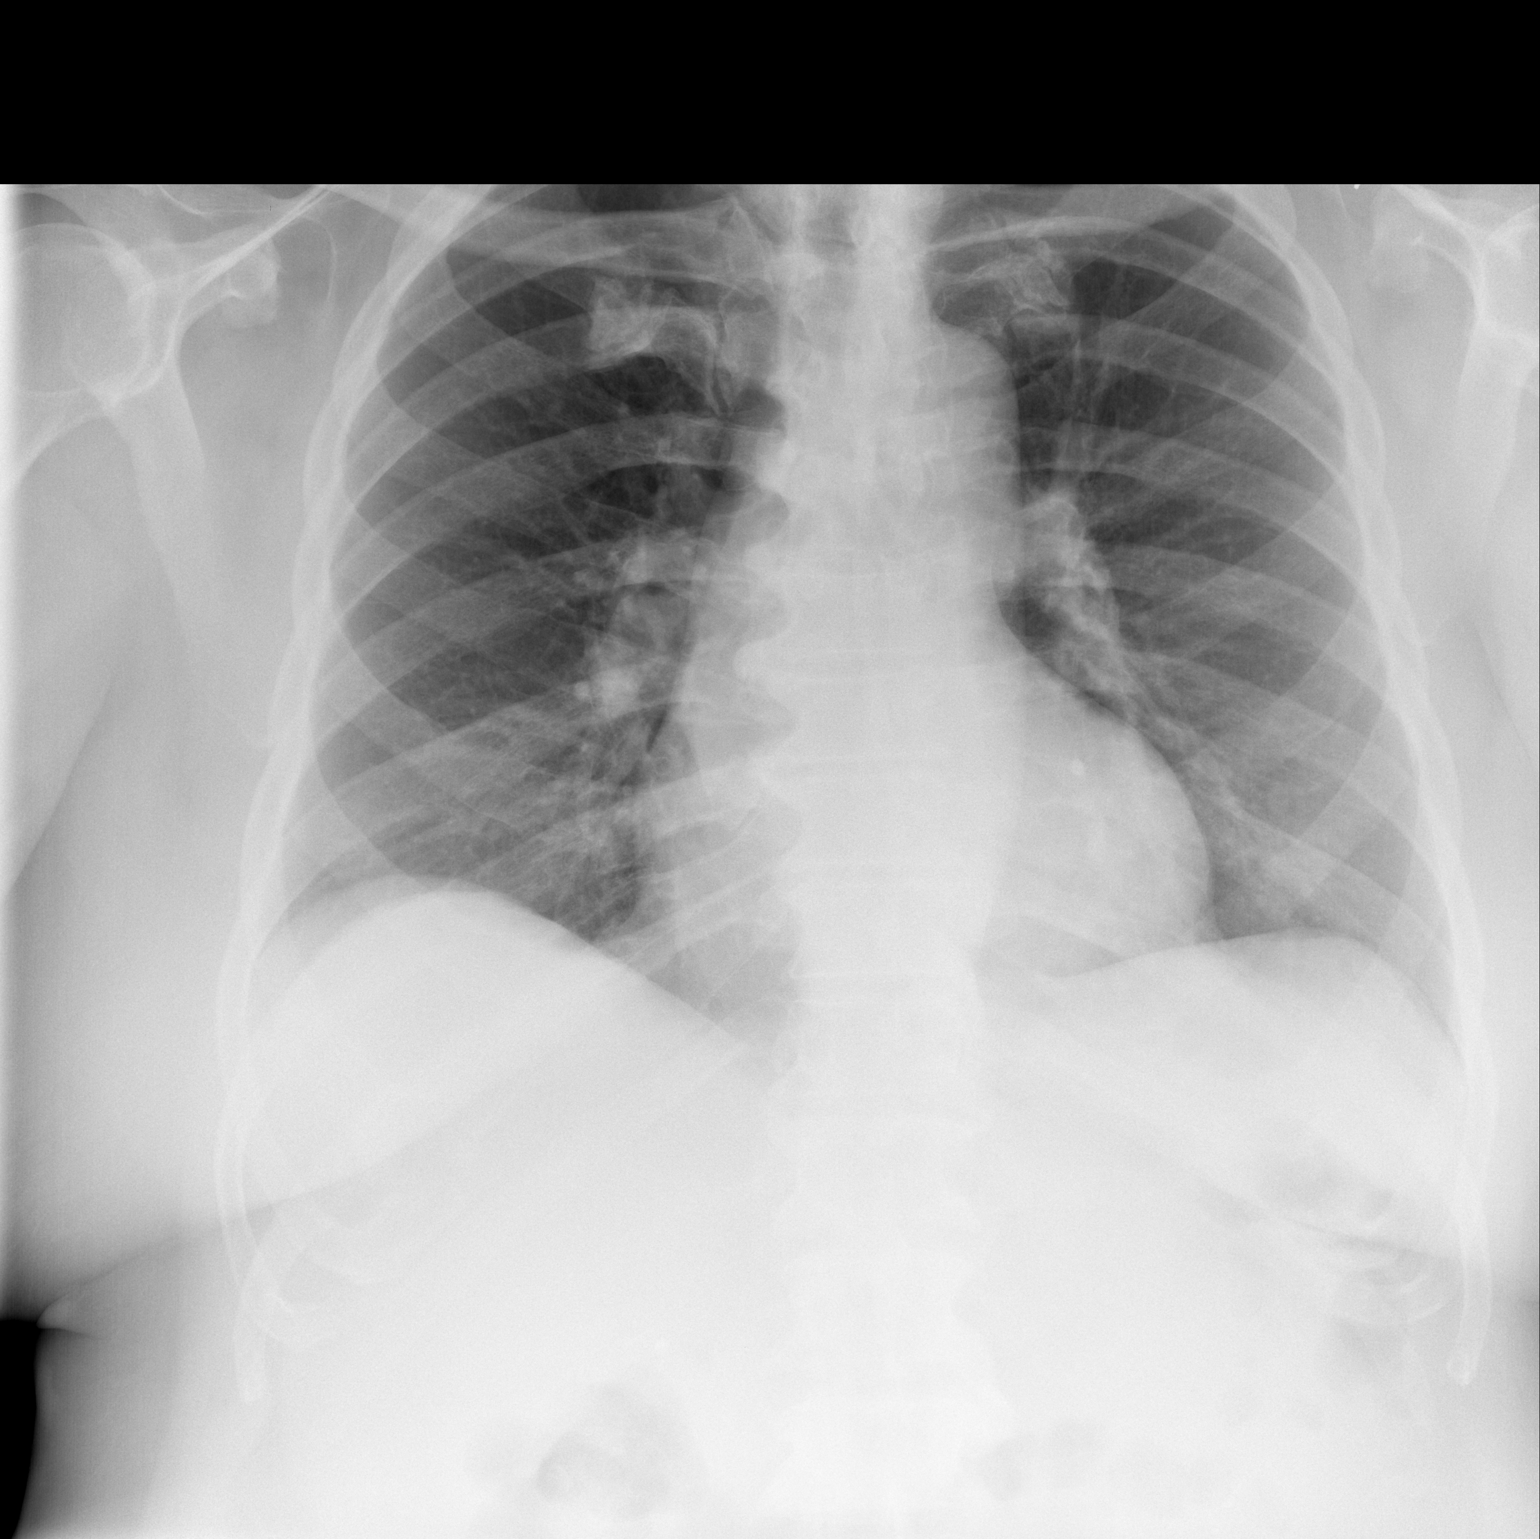

[w chest lat *]
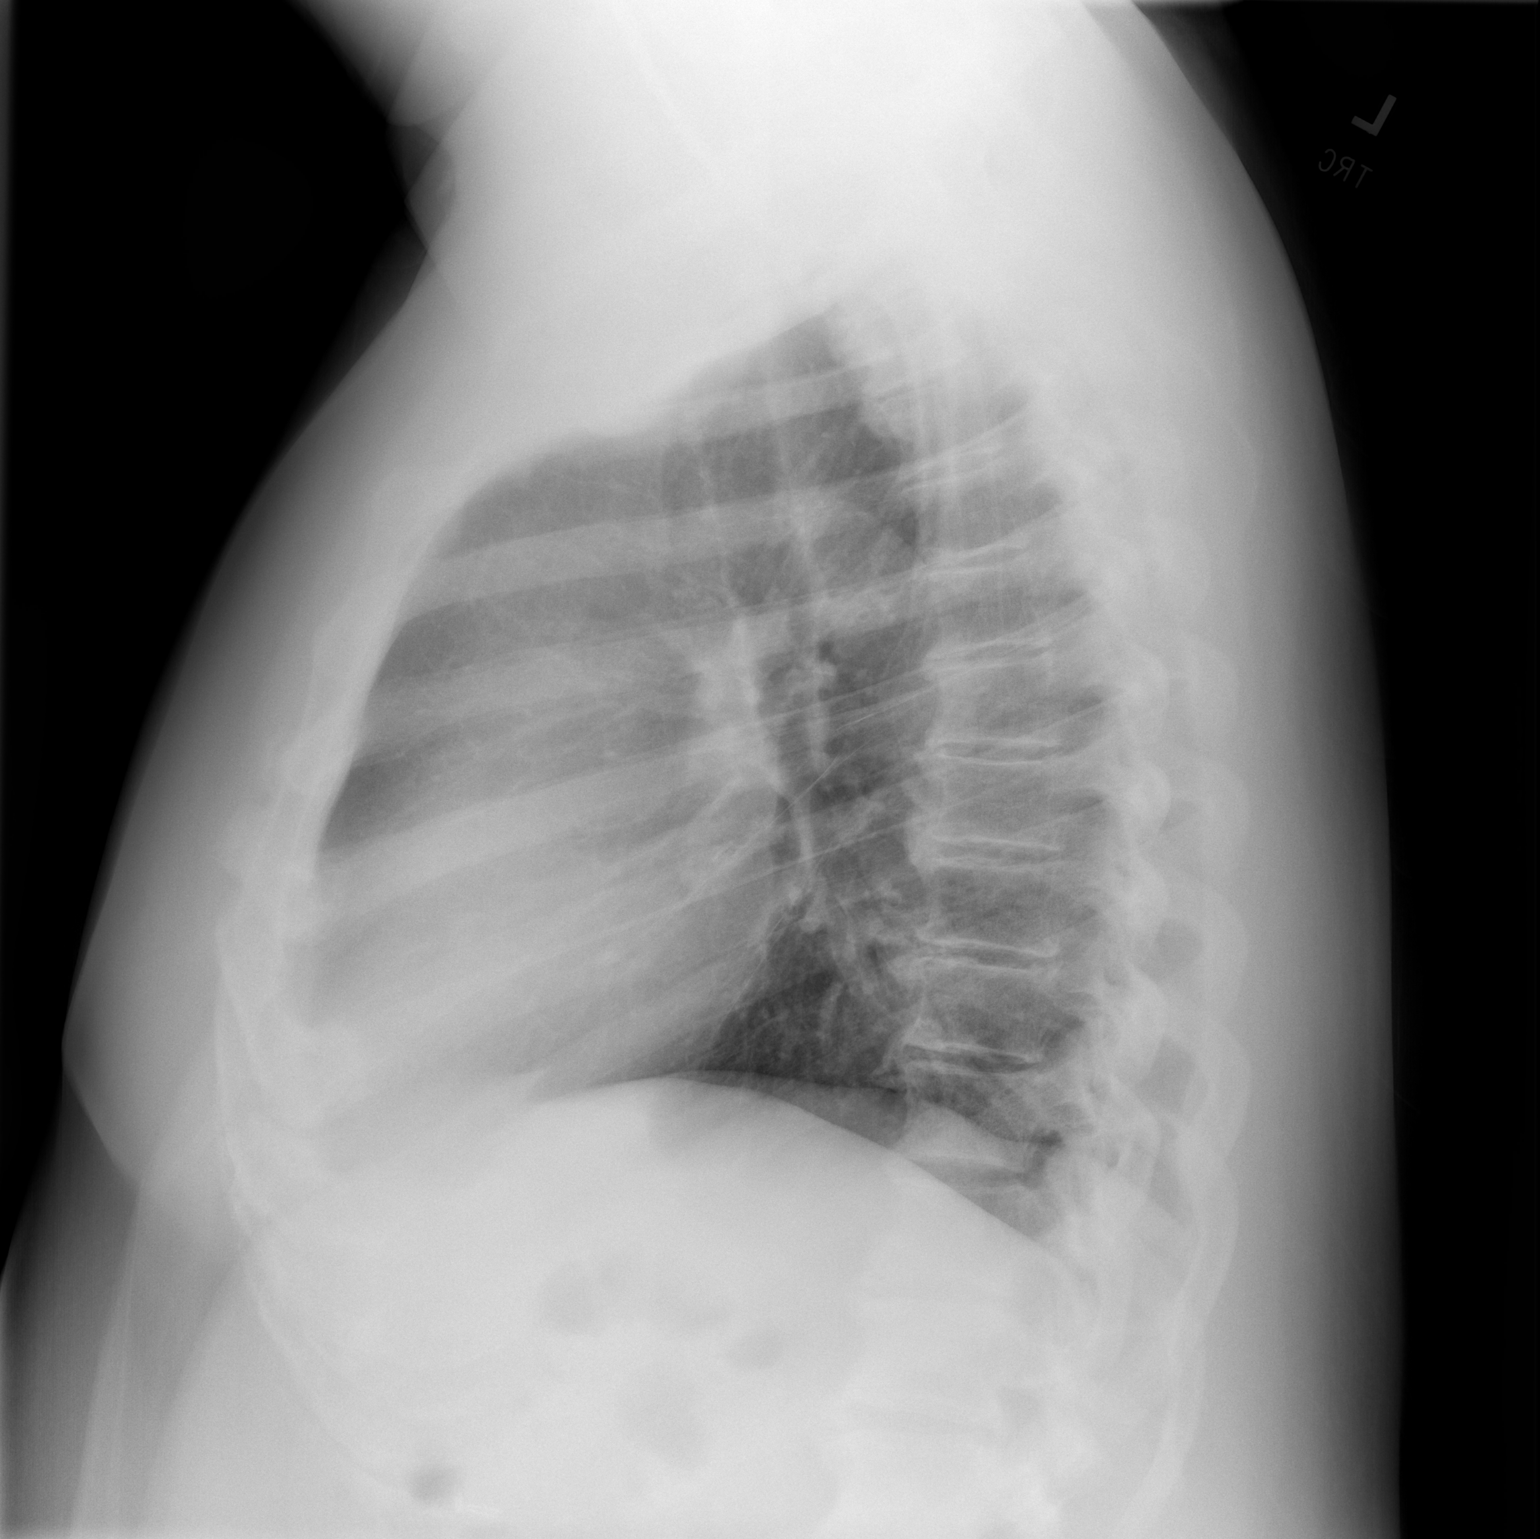

[2 of 2 positions shown; findings below may reference images not displayed]

FINDINGS: The heart size and mediastinal contours are normal. The lungs are
clear. There is no pleural effusion or pneumothorax. No acute
osseous findings are identified. There are postsurgical changes from
previous lower cervical fusion. Mild degenerative changes are
present in the spine.
IMPRESSION: Stable chest.  No active cardiopulmonary process.

## 2022-03-06 ENCOUNTER — Other Ambulatory Visit (HOSPITAL_COMMUNITY): Payer: Self-pay

## 2022-03-06 MED ORDER — SPIRONOLACTONE 25 MG PO TABS
25.0000 mg | ORAL_TABLET | Freq: Two times a day (BID) | ORAL | 2 refills | Status: DC
  Filled 2022-03-06: qty 180, 90d supply, fill #0
  Filled 2022-06-03: qty 180, 90d supply, fill #1
  Filled 2022-09-02: qty 180, 90d supply, fill #2

## 2022-03-07 ENCOUNTER — Other Ambulatory Visit (HOSPITAL_COMMUNITY): Payer: Self-pay

## 2022-03-18 ENCOUNTER — Other Ambulatory Visit (HOSPITAL_COMMUNITY): Payer: Self-pay

## 2022-03-18 MED ORDER — PREVNAR 20 0.5 ML IM SUSY
PREFILLED_SYRINGE | INTRAMUSCULAR | 0 refills | Status: DC
  Filled 2022-03-18: qty 0.5, 1d supply, fill #0

## 2022-03-18 MED ORDER — TETANUS-DIPHTH-ACELL PERTUSSIS 5-2.5-18.5 LF-MCG/0.5 IM SUSY
PREFILLED_SYRINGE | INTRAMUSCULAR | 0 refills | Status: DC
  Filled 2022-03-18: qty 0.5, 1d supply, fill #0

## 2022-03-19 ENCOUNTER — Other Ambulatory Visit (HOSPITAL_COMMUNITY): Payer: Self-pay

## 2022-04-04 ENCOUNTER — Other Ambulatory Visit (HOSPITAL_COMMUNITY): Payer: Self-pay

## 2022-04-04 ENCOUNTER — Other Ambulatory Visit: Payer: Self-pay | Admitting: Interventional Cardiology

## 2022-04-04 MED ORDER — CARVEDILOL 25 MG PO TABS
25.0000 mg | ORAL_TABLET | Freq: Two times a day (BID) | ORAL | 1 refills | Status: DC
  Filled 2022-04-04: qty 180, 90d supply, fill #0
  Filled 2022-07-01: qty 180, 90d supply, fill #1

## 2022-04-30 ENCOUNTER — Telehealth: Payer: Self-pay | Admitting: Internal Medicine

## 2022-04-30 NOTE — Telephone Encounter (Signed)
Patient informed of message, He did stated that after calling the office he did have a BM and has an upcoming and appt

## 2022-04-30 NOTE — Telephone Encounter (Signed)
Patient called and said that his last normal bowel movement was last Wednesday and since he has only has once a day and it has been a lot smaller than normal. He wasn't sure if he should be seen or just try something over the counter. Please advise. Callback is [phone]

## 2022-05-03 ENCOUNTER — Other Ambulatory Visit: Payer: Self-pay | Admitting: Internal Medicine

## 2022-05-03 ENCOUNTER — Other Ambulatory Visit (HOSPITAL_COMMUNITY): Payer: Self-pay

## 2022-05-03 MED ORDER — ZOLPIDEM TARTRATE 5 MG PO TABS
5.0000 mg | ORAL_TABLET | Freq: Every evening | ORAL | 1 refills | Status: DC | PRN
  Filled 2022-05-03: qty 90, 90d supply, fill #0
  Filled 2022-08-01: qty 90, 90d supply, fill #1

## 2022-05-06 ENCOUNTER — Other Ambulatory Visit (HOSPITAL_COMMUNITY): Payer: Self-pay

## 2022-05-13 ENCOUNTER — Other Ambulatory Visit: Payer: Medicare Other

## 2022-05-13 DIAGNOSIS — E1169 Type 2 diabetes mellitus with other specified complication: Secondary | ICD-10-CM | POA: Diagnosis not present

## 2022-05-13 DIAGNOSIS — E118 Type 2 diabetes mellitus with unspecified complications: Secondary | ICD-10-CM

## 2022-05-13 DIAGNOSIS — E7800 Pure hypercholesterolemia, unspecified: Secondary | ICD-10-CM | POA: Diagnosis not present

## 2022-05-13 DIAGNOSIS — R7989 Other specified abnormal findings of blood chemistry: Secondary | ICD-10-CM | POA: Diagnosis not present

## 2022-05-13 DIAGNOSIS — Z125 Encounter for screening for malignant neoplasm of prostate: Secondary | ICD-10-CM

## 2022-05-13 DIAGNOSIS — I10 Essential (primary) hypertension: Secondary | ICD-10-CM

## 2022-05-14 ENCOUNTER — Encounter: Payer: Self-pay | Admitting: Internal Medicine

## 2022-05-14 ENCOUNTER — Ambulatory Visit (INDEPENDENT_AMBULATORY_CARE_PROVIDER_SITE_OTHER): Payer: Medicare Other | Admitting: Internal Medicine

## 2022-05-14 VITALS — BP 126/74 | HR 70 | Temp 98.0°F | Ht 70.25 in | Wt 277.8 lb

## 2022-05-14 DIAGNOSIS — Z9889 Other specified postprocedural states: Secondary | ICD-10-CM

## 2022-05-14 DIAGNOSIS — I10 Essential (primary) hypertension: Secondary | ICD-10-CM | POA: Diagnosis not present

## 2022-05-14 DIAGNOSIS — F409 Phobic anxiety disorder, unspecified: Secondary | ICD-10-CM

## 2022-05-14 DIAGNOSIS — D72819 Decreased white blood cell count, unspecified: Secondary | ICD-10-CM | POA: Diagnosis not present

## 2022-05-14 DIAGNOSIS — Z6839 Body mass index (BMI) 39.0-39.9, adult: Secondary | ICD-10-CM

## 2022-05-14 DIAGNOSIS — E785 Hyperlipidemia, unspecified: Secondary | ICD-10-CM

## 2022-05-14 DIAGNOSIS — Z96653 Presence of artificial knee joint, bilateral: Secondary | ICD-10-CM

## 2022-05-14 DIAGNOSIS — F5105 Insomnia due to other mental disorder: Secondary | ICD-10-CM

## 2022-05-14 DIAGNOSIS — Z0000 Encounter for general adult medical examination without abnormal findings: Secondary | ICD-10-CM | POA: Diagnosis not present

## 2022-05-14 DIAGNOSIS — I5042 Chronic combined systolic (congestive) and diastolic (congestive) heart failure: Secondary | ICD-10-CM

## 2022-05-14 DIAGNOSIS — I700 Atherosclerosis of aorta: Secondary | ICD-10-CM

## 2022-05-14 DIAGNOSIS — Z8659 Personal history of other mental and behavioral disorders: Secondary | ICD-10-CM | POA: Diagnosis not present

## 2022-05-14 DIAGNOSIS — D649 Anemia, unspecified: Secondary | ICD-10-CM

## 2022-05-14 DIAGNOSIS — F411 Generalized anxiety disorder: Secondary | ICD-10-CM

## 2022-05-14 DIAGNOSIS — E1169 Type 2 diabetes mellitus with other specified complication: Secondary | ICD-10-CM | POA: Diagnosis not present

## 2022-05-14 DIAGNOSIS — E7800 Pure hypercholesterolemia, unspecified: Secondary | ICD-10-CM

## 2022-05-14 LAB — POCT URINALYSIS DIPSTICK
Bilirubin, UA: NEGATIVE
Blood, UA: NEGATIVE
Glucose, UA: NEGATIVE
Ketones, UA: NEGATIVE
Leukocytes, UA: NEGATIVE
Nitrite, UA: NEGATIVE
Protein, UA: NEGATIVE
Spec Grav, UA: 1.01 (ref 1.010–1.025)
Urobilinogen, UA: 0.2 E.U./dL
pH, UA: 5 (ref 5.0–8.0)

## 2022-05-14 LAB — COMPLETE METABOLIC PANEL WITH GFR
AG Ratio: 1.5 (calc) (ref 1.0–2.5)
ALT: 12 U/L (ref 9–46)
AST: 14 U/L (ref 10–35)
Albumin: 4.6 g/dL (ref 3.6–5.1)
Alkaline phosphatase (APISO): 66 U/L (ref 35–144)
BUN: 18 mg/dL (ref 7–25)
CO2: 28 mmol/L (ref 20–32)
Calcium: 9.7 mg/dL (ref 8.6–10.3)
Chloride: 100 mmol/L (ref 98–110)
Creat: 1 mg/dL (ref 0.70–1.35)
Globulin: 3 g/dL (calc) (ref 1.9–3.7)
Glucose, Bld: 109 mg/dL — ABNORMAL HIGH (ref 65–99)
Potassium: 4 mmol/L (ref 3.5–5.3)
Sodium: 138 mmol/L (ref 135–146)
Total Bilirubin: 1.3 mg/dL — ABNORMAL HIGH (ref 0.2–1.2)
Total Protein: 7.6 g/dL (ref 6.1–8.1)
eGFR: 81 mL/min/{1.73_m2} (ref >=60)

## 2022-05-14 LAB — PSA: PSA: 0.48 ng/mL (ref <=4.00)

## 2022-05-14 LAB — CBC WITH DIFFERENTIAL/PLATELET
Absolute Monocytes: 454 cells/uL (ref 200–950)
Basophils Absolute: 50 cells/uL (ref 0–200)
Basophils Relative: 1.4 %
Eosinophils Absolute: 101 cells/uL (ref 15–500)
Eosinophils Relative: 2.8 %
HCT: 39.4 % (ref 38.5–50.0)
Hemoglobin: 13 g/dL — ABNORMAL LOW (ref 13.2–17.1)
Lymphs Abs: 1721 cells/uL (ref 850–3900)
MCH: 31.6 pg (ref 27.0–33.0)
MCHC: 33 g/dL (ref 32.0–36.0)
MCV: 95.9 fL (ref 80.0–100.0)
MPV: 10 fL (ref 7.5–12.5)
Monocytes Relative: 12.6 %
Neutro Abs: 1274 cells/uL — ABNORMAL LOW (ref 1500–7800)
Neutrophils Relative %: 35.4 %
Platelets: 229 10*3/uL (ref 140–400)
RBC: 4.11 10*6/uL — ABNORMAL LOW (ref 4.20–5.80)
RDW: 12.3 % (ref 11.0–15.0)
Total Lymphocyte: 47.8 %
WBC: 3.6 10*3/uL — ABNORMAL LOW (ref 3.8–10.8)

## 2022-05-14 LAB — LIPID PANEL
Cholesterol: 130 mg/dL (ref <200)
HDL: 51 mg/dL (ref >=40)
LDL Cholesterol (Calc): 62 mg/dL (calc)
Non-HDL Cholesterol (Calc): 79 mg/dL (calc) (ref <130)
Total CHOL/HDL Ratio: 2.5 (calc) (ref <5.0)
Triglycerides: 85 mg/dL (ref <150)

## 2022-05-14 LAB — HEMOGLOBIN A1C
Hgb A1c MFr Bld: 6.2 % of total Hgb — ABNORMAL HIGH (ref <5.7)
Mean Plasma Glucose: 131 mg/dL
eAG (mmol/L): 7.3 mmol/L

## 2022-05-14 NOTE — Progress Notes (Signed)
     Annual Wellness Visit     Patient: [name] [name], Male    DOB: July 07, 1951, 71 y.o.   MRN: [id number] Visit Date: 05/14/2022   Subjective    [name] [name] is a 71 y.o. male who presents today for his Annual Wellness Visit.       Patient Care Team: Elby Showers, MD as PCP - General (Internal Medicine) Belva Crome, MD as PCP - Cardiology (Cardiology)  Review of Systems   Objective    Vitals: BP 126/74   Pulse 70   Temp 98 F (36.7 C) (Tympanic)   Ht 5' 10.25" (1.784 m)   Wt 277 lb 12.8 oz (126 kg)   SpO2 99%   BMI 39.58 kg/m   Physical Exam   Most recent functional status assessment:    05/14/2022   10:02 AM  In your present state of health, do you have any difficulty performing the following activities:  Hearing? 0  Vision? 0  Difficulty concentrating or making decisions? 0  Walking or climbing stairs? 0  Dressing or bathing? 0  Doing errands, shopping? 0  Preparing Food and eating ? N  Using the Toilet? N  In the past six months, have you accidently leaked urine? N  Do you have problems with loss of bowel control? N  Managing your Medications? N  Managing your Finances? N  Housekeeping or managing your Housekeeping? N   Most recent fall risk assessment:    05/14/2022   10:01 AM  Fall Risk   Falls in the past year? 0  Number falls in past yr: 0  Injury with Fall? 0  Risk for fall due to : No Fall Risks  Follow up Falls evaluation completed    Most recent depression screenings:    05/14/2022   10:01 AM 05/04/2021    2:10 PM  PHQ 2/9 Scores  PHQ - 2 Score 0 0   Most recent cognitive screening:    05/14/2022   10:03 AM  6CIT Screen  What Year? 0 points  What month? 0 points  What time? 0 points  Count back from 20 0 points  Months in reverse 0 points  Repeat phrase 0 points  Total Score 0 points       Assessment & Plan     Annual Medicare wellness visit     Annual wellness visit done today including the all of  the following: Reviewed patient's Family Medical History Reviewed and updated list of patient's medical providers Assessment of cognitive impairment was done Assessed patient's functional ability Established a written schedule for health screening [place] Completed and Reviewed  Discussed health benefits of physical activity, and encouraged him to engage in regular exercise appropriate for his age and condition.         {I, Elby Showers, MD, have reviewed all documentation for this visit. The documentation on 06/05/22 for the exam, diagnosis, procedures, and orders are all accurate and complete.   Rhyanna Sorce Barron Alvine, CMA

## 2022-05-14 NOTE — Progress Notes (Signed)
   Subjective:    Patient ID: [name] [name], male    DOB: [birth year], 71 y.o.   MRN: [id number]  HPI 71 year old Male seen for health maintenance exam and evaluation of medical issues.  He had Medicare wellness visit in October 2022.  He has a history of diabetes mellitus, hypertension, obesity, gout, GE reflux, erectile dysfunction.  History of right knee arthroplasty October 12, 2020.  Left knee arthroplasty January 2020.  He had right carpal tunnel release June  Followed by Dr. Pernell Dupre.  Was seen in the emergency department May 2023 with chest pain.  MI was ruled out.  Dr. Tamala Julian saw him in 10-12-21 and felt patient had nonobstructive/minimal atherosclerosis of the coronary arteries, left ventricular dysfunction, obesity.  Dr. Tamala Julian diagnosed patient with combined chronic systolic and diastolic heart failure, essential hypertension, PVCs, hyperlipidemia and abdominal aortic atherosclerosis.  Patient is on Entresto, Aldactone, furosemide and carvedilol.  Ejection fraction on echo in April 2021 was 55 to 60%.  In 10/13/2018 ejection fraction was 30 to 35%.  Hospitalized with acute cholecystitis July 2017.  He had ERCP and laparoscopic cholecystectomy.  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  Wife is retired from the school system.  He does not smoke or consume alcohol.  1 son with history of kidney transplant due to hypertension and chronic kidney disease.  They have 3 adult children, 2 sons and a daughter.   Unfortunately he has gained weight back.  At one point in October 2022 he was 257 pounds.  Family history: Family history of hypertension in mother.  Mother also had history of stroke.  She died at age 71 in 10/12/2001 of a CVA.  Review of Systems Had December 27, 2021 right hand carpal tunnel release and February 14, 2022 left hand carpal tunnel release.  He is recovering from this and hands are functioning better.     Objective:   Physical  Exam Blood pressure 126/74 pulse 70 temperature 98 degrees pulse oximetry 99% weight 277 pounds 12.8 ounces height 5 feet 10.25 inches BMI 39.58  Skin: Warm and dry.  No cervical adenopathy.  Neck is supple.  Chest clear.  Cardiac exam: Regular rate and rhythm.  Abdomen obese soft nondistended without hepatosplenomegaly masses or tenderness.  Prostate is normal without nodules.  No pitting edema of the lower extremities.  Brief neurological exam is intact without gross focal deficits.  Cranial nerves II through XII are grossly intact and there are no neurological deficits on brief neurological exam.  Affect thought and judgment are normal.     Assessment & Plan:  Type 2 diabetes mellitus-hemoglobin A1c stable at 6.2%  Status post bilateral carpal tunnel release  History of bilateral knee arthroplasties  History of combined systolic and diastolic heart failure stable on Coreg, Lasix, Aldactone, Entresto  GE reflux treated with Nexium  Hypertension stable on current regimen  Insomnia treated sparingly with Ambien  Anxiety treated with Xanax  Obesity-needs to lose weight  History of Schatzki's ring dilated by Dr. Carlean Purl in 2006-10-13  History of colonoscopy by Dr. Carlean Purl in 13-Oct-2019  Plan: Continue current medications.  Work on diet exercise and weight loss and follow-up here in 6 months.

## 2022-05-16 ENCOUNTER — Other Ambulatory Visit: Payer: Self-pay | Admitting: Internal Medicine

## 2022-05-16 ENCOUNTER — Other Ambulatory Visit (HOSPITAL_COMMUNITY): Payer: Self-pay

## 2022-05-16 MED ORDER — METFORMIN HCL ER 500 MG PO TB24
500.0000 mg | ORAL_TABLET | Freq: Two times a day (BID) | ORAL | 3 refills | Status: DC
  Filled 2022-05-16: qty 180, 90d supply, fill #0
  Filled 2022-09-02: qty 180, 90d supply, fill #1
  Filled 2022-11-27: qty 180, 90d supply, fill #2
  Filled 2023-02-24: qty 180, 90d supply, fill #3

## 2022-05-22 ENCOUNTER — Other Ambulatory Visit (HOSPITAL_COMMUNITY): Payer: Self-pay

## 2022-06-03 ENCOUNTER — Other Ambulatory Visit (HOSPITAL_COMMUNITY): Payer: Self-pay

## 2022-06-03 ENCOUNTER — Other Ambulatory Visit: Payer: Self-pay | Admitting: Internal Medicine

## 2022-06-03 MED ORDER — ALPRAZOLAM 0.5 MG PO TABS
ORAL_TABLET | ORAL | 3 refills | Status: DC
  Filled 2022-06-03: qty 90, 30d supply, fill #0
  Filled 2022-07-01: qty 90, 30d supply, fill #1

## 2022-06-05 NOTE — Patient Instructions (Signed)
It was a pleasure to see you today.  Please work on diet exercise and weight loss.  Follow-up in 6 months and no change in medications.

## 2022-06-11 ENCOUNTER — Telehealth: Payer: Self-pay | Admitting: Interventional Cardiology

## 2022-06-11 NOTE — Telephone Encounter (Signed)
  Pt is requesting a call back from Dr. Thompson Caul nurse. He said its regarding the letter he received about Dr. Tamala Julian is retiring and he has some questions

## 2022-06-13 NOTE — Telephone Encounter (Signed)
Returned call to patient.  Patient asked who he should follow-up with after Dr. Francee Piccolo. Informed patient that Dr. Tamala Julian has been recommending Dr. Marlou Porch, Dr. Gasper Sells and Dr. Johney Frame. Patient states he would like to be seen by Dr. Candee Furbish moving forward.   Noted patient is due for follow-up this month for 9-12 month OV F/U. Offered appt with Dr. Tamala Julian on 07/12/22, patient accepted.  Patient expressed appreciation for call.

## 2022-06-19 ENCOUNTER — Other Ambulatory Visit (HOSPITAL_COMMUNITY): Payer: Self-pay

## 2022-07-02 ENCOUNTER — Other Ambulatory Visit: Payer: Self-pay

## 2022-07-09 ENCOUNTER — Other Ambulatory Visit: Payer: Self-pay

## 2022-07-09 ENCOUNTER — Other Ambulatory Visit (HOSPITAL_COMMUNITY): Payer: Self-pay

## 2022-07-11 NOTE — Progress Notes (Signed)
Cardiology Office Note:    Date:  07/12/2022   ID:  [name] [name], DOB 1951-01-30, MRN [id number]  PCP:  Elby Showers, MD  Cardiologist:  Sinclair Grooms, MD   Referring MD: Elby Showers, MD   Chief Complaint  Patient presents with   Congestive Heart Failure   Hypertension    History of Present Illness:    [name] [name] is a 72 y.o. male with a hx of  type 2 diabetes mellitus, hypertension, abdominal atherosclerosis, nonobstructive/minimal atherosclerosis in coronary arteries, severe obesity, and systolic left ventricular dysfunction (EF 35%,6/ 2019 -->60%, 10/2019) on GDMT.   He is in today.  Feeling relatively well.  Denies orthopnea and PND.  Concerned about who will be his cardiologist going forward.  He requests Dr. Candee Furbish.  He is status post bilateral carpal tunnel.  His EKG demonstrates relatively low voltage.  He has first-degree AV block.  Past Medical History:  Diagnosis Date   Anxiety    Arthritis    Bradycardia    hx of  in 30's medications changed   Diabetes mellitus without complication ([place])    DJD (degenerative joint disease) of knee    Left   Hx of adenomatous colonic polyps 12/2019   2 diminutive   Hypertension     Past Surgical History:  Procedure Laterality Date   CARDIAC CATHETERIZATION  07/23/2018   CHOLECYSTECTOMY N/A 02/04/2016   Procedure: LAPAROSCOPIC CHOLECYSTECTOMY;  Surgeon: Leighton Ruff, MD;  Location: [facility];  Service: General;  Laterality: N/A;   COLONOSCOPY  12/13/2019   ERCP N/A 02/02/2016   Procedure: ENDOSCOPIC RETROGRADE CHOLANGIOPANCREATOGRAPHY (ERCP);  Surgeon: [name], MD;  Location: [facility];  Service: Endoscopy;  Laterality: N/A;   HERNIA REPAIR     RIGHT/LEFT HEART CATH AND CORONARY ANGIOGRAPHY N/A 07/23/2018   Procedure: RIGHT/LEFT HEART CATH AND CORONARY ANGIOGRAPHY;  Surgeon: Belva Crome, MD;  Location: [facility];  Service: Cardiovascular;  Laterality: N/A;   SPINE SURGERY     TOTAL  KNEE ARTHROPLASTY Left 07/28/2018   Procedure: TOTAL KNEE ARTHROPLASTY;  Surgeon: Melrose Nakayama, MD;  Location: [facility];  Service: Orthopedics;  Laterality: Left;   TOTAL KNEE ARTHROPLASTY Right 09/05/2020   Procedure: RIGHT TOTAL KNEE ARTHROPLASTY;  Surgeon: Melrose Nakayama, MD;  Location: [facility];  Service: Orthopedics;  Laterality: Right;    Current Medications: Current Meds  Medication Sig   acetaminophen (TYLENOL) 650 MG CR tablet Take 1,300 mg by mouth every 8 (eight) hours as needed for pain.   ALPRAZolam (XANAX) 0.5 MG tablet Take 0.5-1 tablets (0.25-0.5 mg total) in the morning AND 0.5-1 tablets (0.25-0.5 mg total) in the early afternoon and 1 tablet (0.5 mg total) at bedtime  for anxiety   amLODipine (NORVASC) 10 MG tablet Take 1 tablet (10 mg total) by mouth daily.   aspirin EC 81 MG tablet Take 1 tablet (81 mg total) by mouth 2 (two) times daily after a meal.   atorvastatin (LIPITOR) 10 MG tablet TAKE 1 TABLET BY MOUTH ONCE DAILY. (Patient taking differently: Take 10 mg by mouth daily.)   carvedilol (COREG) 25 MG tablet Take 1 tablet (25 mg total) by mouth 2 (two) times daily.   esomeprazole (NEXIUM) 40 MG capsule Take 1 capsule (40 mg total) by mouth daily.   furosemide (LASIX) 40 MG tablet Take 1 tablet (40 mg total) by mouth daily.   Magnesium 400 MG TABS Take 400 mg by mouth daily.   metFORMIN (  GLUCOPHAGE-XR) 500 MG 24 hr tablet Take 1 tablet (500 mg total) by mouth 2 (two) times daily.   sacubitril-valsartan (ENTRESTO) 97-103 MG Take 1 tablet by mouth 2 (two) times daily.   spironolactone (ALDACTONE) 25 MG tablet Take 1 tablet (25 mg total) by mouth 2 (two) times daily.   Tdap (BOOSTRIX) 5-2.5-18.5 LF-MCG/0.5 injection Inject into the muscle.   tiZANidine (ZANAFLEX) 2 MG tablet Take 1-2 tablets (2-4 mg total) by mouth 2-3 (three) times daily as needed for muscle spasm   traMADol (ULTRAM) 50 MG tablet Take 1 tablet (50 mg total) by mouth 2 to 3 times a day as needed for pain    zolpidem (AMBIEN) 5 MG tablet Take 1 tablet (5 mg total) by mouth at bedtime as needed for sleep.     Allergies:   Patient has no known allergies.   Social History   Socioeconomic History   Marital status: Married    Spouse name: Not on file   Number of children: Not on file   Years of education: Not on file   Highest education level: Not on file  Occupational History   Not on file  Tobacco Use   Smoking status: Never   Smokeless tobacco: Never  Vaping Use   Vaping Use: Never used  Substance and Sexual Activity   Alcohol use: No   Drug use: No   Sexual activity: Yes  Other Topics Concern   Not on file  Social History Narrative   Retired from [employer]   Social Determinants of Health   Financial Resource Strain: Not on file  Food Insecurity: Not on file  Transportation Needs: Not on file  Physical Activity: Not on file  Stress: Not on file  Social Connections: Not on file     Family History: The patient's family history includes Hypertension in his mother; Stroke in his mother. There is no history of Colon cancer, Stomach cancer, Rectal cancer, or Pancreatic cancer.  ROS:   Please see the history of present illness.    Bilateral carpal tunnel surgery.  Some low back discomfort.  All other systems reviewed and are negative.  EKGs/Labs/Other Studies Reviewed:    The following studies were reviewed today:  ECHOCARDIOGRAM 10/25/2019: IMPRESSIONS   1. Left ventricular ejection fraction, by estimation, is 55 to 60%. The  left ventricle has normal function. The left ventricle has no regional  wall motion abnormalities. There is mild left ventricular hypertrophy.  Left ventricular diastolic parameters  were normal.   2. Right ventricular systolic function is normal. The right ventricular  size is normal. There is mildly elevated pulmonary artery systolic  pressure.   3. Left atrial size was moderately dilated.   4. The mitral valve is normal in structure.  Trivial mitral valve  regurgitation. No evidence of mitral stenosis.   5. The aortic valve is tricuspid. Aortic valve regurgitation is not  visualized. Mild to moderate aortic valve sclerosis/calcification is  present, without any evidence of aortic stenosis.   6. The inferior vena cava is normal in size with greater than 50%  respiratory variability, suggesting right atrial pressure of 3 mmHg.   EKG:  EKG normal sinus rhythm with nonspecific T wave flattening and PR interval 222 ms.  When compared to Dec 04, 2021, the PR interval has increased from 203 ms.  Recent Labs: 05/13/2022: ALT 12; BUN 18; Creat 1.00; Hemoglobin 13.0; Platelets 229; Potassium 4.0; Sodium 138  Recent Lipid Panel    Component Value Date/Time  CHOL 130 05/13/2022 0909   TRIG 85 05/13/2022 0909   HDL 51 05/13/2022 0909   CHOLHDL 2.5 05/13/2022 0909   VLDL 14 10/18/2016 1048   LDLCALC 62 05/13/2022 0909    Physical Exam:    VS:  Ht 5' 10.75" (1.797 m)   BMI 39.02 kg/m     Wt Readings from Last 3 Encounters:  05/14/22 277 lb 12.8 oz (126 kg)  12/03/21 (!) 308 lb 10.3 oz (140 kg)  10/29/21 283 lb 12 oz (128.7 kg)     GEN: Overweight. No acute distress HEENT: Normal NECK: No JVD. LYMPHATICS: No lymphadenopathy CARDIAC: No murmur. RRR S4 without S3 gallop, or edema. VASCULAR:  Normal Pulses. No bruits. RESPIRATORY:  Clear to auscultation without rales, wheezing or rhonchi  ABDOMEN: Soft, non-tender, non-distended, No pulsatile mass, MUSCULOSKELETAL: No deformity  SKIN: Warm and dry NEUROLOGIC:  Alert and oriented x 3 PSYCHIATRIC:  Normal affect   ASSESSMENT:    1. Chronic combined systolic and diastolic heart failure ([place])   2. Essential hypertension   3. PVC's (premature ventricular contractions)   4. Type 2 diabetes mellitus with complication, without long-term current use of insulin ([place])   5. Hyperlipidemia associated with type 2 diabetes mellitus ([place])    PLAN:    In order of problems  listed above:  Systolic function has returned to normal as of 2021 echocardiogram.  Echo demonstrated mild LVH and there is relatively low voltage on EKG.  Also note first-degree AV block and in conjunction with recent history of bilateral carpal tunnel, he is someone that I will be concerned about the possibility of developing ATTR amyloidosis.  Plan to get 2D Doppler echocardiogram in July.  Follow-up with Dr. Marlou Porch in August or September. Blood pressure controlled on heart failure therapy. No significant issues with palpitations Managed by primary care LDL should be treated to avoid vascular events.   Medication Adjustments/Labs and Tests Ordered: Current medicines are reviewed at length with the patient today.  Concerns regarding medicines are outlined above.  No orders of the defined types were placed in this encounter.  No orders of the defined types were placed in this encounter.   There are no Patient Instructions on file for this visit.   Signed, Sinclair Grooms, MD  07/12/2022 9:11 AM    [place]

## 2022-07-12 ENCOUNTER — Ambulatory Visit: Payer: Medicare Other | Attending: Interventional Cardiology | Admitting: Interventional Cardiology

## 2022-07-12 ENCOUNTER — Encounter: Payer: Self-pay | Admitting: Interventional Cardiology

## 2022-07-12 VITALS — BP 132/82 | HR 66 | Ht 70.75 in | Wt 282.0 lb

## 2022-07-12 DIAGNOSIS — I493 Ventricular premature depolarization: Secondary | ICD-10-CM | POA: Diagnosis not present

## 2022-07-12 DIAGNOSIS — E1169 Type 2 diabetes mellitus with other specified complication: Secondary | ICD-10-CM

## 2022-07-12 DIAGNOSIS — E118 Type 2 diabetes mellitus with unspecified complications: Secondary | ICD-10-CM | POA: Diagnosis not present

## 2022-07-12 DIAGNOSIS — I5042 Chronic combined systolic (congestive) and diastolic (congestive) heart failure: Secondary | ICD-10-CM

## 2022-07-12 DIAGNOSIS — I10 Essential (primary) hypertension: Secondary | ICD-10-CM | POA: Diagnosis not present

## 2022-07-12 DIAGNOSIS — E785 Hyperlipidemia, unspecified: Secondary | ICD-10-CM

## 2022-07-12 NOTE — Patient Instructions (Signed)
Medication Instructions:  Your physician recommends that you continue on your current medications as directed. Please refer to the Current Medication list given to you today.  *If you need a refill on your cardiac medications before your next appointment, please call your pharmacy*  Lab Work: NONE  Testing/Procedures: Your physician has requested that you have an echocardiogram in July 2024. Echocardiography is a painless test that uses sound waves to create images of your heart. It provides your doctor with information about the size and shape of your heart and how well your heart's chambers and valves are working. This procedure takes approximately one hour. There are no restrictions for this procedure. Please do NOT wear cologne, perfume, aftershave, or lotions (deodorant is allowed). Please arrive 15 minutes prior to your appointment time.   Follow-Up: At [facility], you and your health needs are our priority.  As part of our continuing mission to provide you with exceptional heart care, we have created designated Provider Care Teams.  These Care Teams include your primary Cardiologist (physician) and Advanced Practice Providers (APPs -  Physician Assistants and Nurse Practitioners) who all work together to provide you with the care you need, when you need it.  Your next appointment:   8 month(s)  The format for your next appointment:   In Person  Provider:   Candee Furbish, MD  Important Information About Sugar

## 2022-07-26 ENCOUNTER — Other Ambulatory Visit (HOSPITAL_COMMUNITY): Payer: Self-pay

## 2022-07-26 DIAGNOSIS — M79641 Pain in right hand: Secondary | ICD-10-CM | POA: Diagnosis not present

## 2022-07-26 DIAGNOSIS — M79642 Pain in left hand: Secondary | ICD-10-CM | POA: Diagnosis not present

## 2022-07-26 MED ORDER — DICLOFENAC SODIUM 1 % EX GEL
CUTANEOUS | 0 refills | Status: DC
  Filled 2022-07-26: qty 100, 15d supply, fill #0

## 2022-08-01 ENCOUNTER — Other Ambulatory Visit: Payer: Self-pay | Admitting: Internal Medicine

## 2022-08-01 ENCOUNTER — Other Ambulatory Visit (HOSPITAL_COMMUNITY): Payer: Self-pay

## 2022-08-01 ENCOUNTER — Other Ambulatory Visit: Payer: Self-pay

## 2022-08-01 NOTE — Telephone Encounter (Signed)
Left message for patient to return call to office at [phone].  

## 2022-08-02 ENCOUNTER — Other Ambulatory Visit: Payer: Self-pay

## 2022-08-02 ENCOUNTER — Other Ambulatory Visit (HOSPITAL_COMMUNITY): Payer: Self-pay

## 2022-08-02 MED ORDER — ALPRAZOLAM 0.5 MG PO TABS
ORAL_TABLET | ORAL | 3 refills | Status: DC
  Filled 2022-08-02: qty 90, 30d supply, fill #0
  Filled 2022-09-02: qty 90, 30d supply, fill #1
  Filled 2022-10-13: qty 90, 30d supply, fill #2
  Filled 2022-11-27: qty 90, 30d supply, fill #3

## 2022-08-02 MED ORDER — MELOXICAM 15 MG PO TABS
15.0000 mg | ORAL_TABLET | Freq: Every day | ORAL | 1 refills | Status: DC
  Filled 2022-08-02: qty 90, 90d supply, fill #0

## 2022-08-02 NOTE — Telephone Encounter (Signed)
Patient returned called and stated he is taking the Alprazolam 3x daily for nervousness and clinching and Meloxicam is helping with his arthritis.  He also stated he is on his last day supply and would like a call when it is sent to the pharmacy.

## 2022-08-05 ENCOUNTER — Other Ambulatory Visit: Payer: Self-pay

## 2022-08-13 ENCOUNTER — Telehealth: Payer: Self-pay | Admitting: Internal Medicine

## 2022-08-13 ENCOUNTER — Encounter: Payer: Self-pay | Admitting: Internal Medicine

## 2022-08-13 ENCOUNTER — Ambulatory Visit (INDEPENDENT_AMBULATORY_CARE_PROVIDER_SITE_OTHER): Payer: Medicare Other | Admitting: Internal Medicine

## 2022-08-13 VITALS — BP 132/78 | HR 69 | Temp 98.0°F | Ht 70.75 in | Wt 282.0 lb

## 2022-08-13 DIAGNOSIS — K625 Hemorrhage of anus and rectum: Secondary | ICD-10-CM

## 2022-08-13 DIAGNOSIS — K921 Melena: Secondary | ICD-10-CM

## 2022-08-13 LAB — HEMOCCULT GUIAC POC 1CARD (OFFICE): Fecal Occult Blood, POC: POSITIVE — AB

## 2022-08-13 NOTE — Progress Notes (Addendum)
Patient Care Team: Elby Showers, MD as PCP - General (Internal Medicine) Belva Crome, MD (Inactive) as PCP - Cardiology (Cardiology)  Visit Date: 08/13/22  Subjective:    Patient ID: [name] [name] , Male   DOB: May 16, 1951, 72 y.o.    MRN: [id number]   71 y.o. Male presents today for blood in stool. Patient has a past medical history of diabetes mellitus, arthritis, degenerative joint disease, adenomatous colonic polyps, hypertension.  Noted blood in toilet after wiping during a soft BM this morning. Reports accompanying gastrointestinal discomfort. Took Pepto Bismol, which relieved pain. Reports having more gastrointestinal gas recently.  Colonoscopy last completed 12/13/19. Results showed two adenomatous polyps, one ulcer at the ileocecal valve, diverticulosis in sigmoid colon, otherwise normal. Recommended repeat with Dr. Carlean Purl in 2028.   Past Medical History:  Diagnosis Date   Anxiety    Arthritis    Bradycardia    hx of  in 30's medications changed   Diabetes mellitus without complication (HCC)    DJD (degenerative joint disease) of knee    Left   Hx of adenomatous colonic polyps 12/2019   2 diminutive   Hypertension      Family History  Problem Relation Age of Onset   Stroke Mother    Hypertension Mother    Colon cancer Neg Hx    Stomach cancer Neg Hx    Rectal cancer Neg Hx    Pancreatic cancer Neg Hx     Social History   Social History Narrative   Retired from [employer]      Review of Systems  Constitutional:  Negative for fever and malaise/fatigue.  HENT:  Negative for congestion.   Eyes:  Negative for blurred vision.  Respiratory:  Negative for cough and shortness of breath.   Cardiovascular:  Negative for chest pain, palpitations and leg swelling.  Gastrointestinal:  Positive for blood in stool (This morning). Negative for vomiting.  Musculoskeletal:  Negative for back pain.  Skin:  Negative for rash.  Neurological:  Negative  for loss of consciousness and headaches.        Objective:   Vitals: BP 132/78   Pulse 69   Temp 98 F (36.7 C) (Tympanic)   Ht 5' 10.75" (1.797 m)   Wt 282 lb (127.9 kg)   SpO2 98%   BMI 39.61 kg/m    Physical Exam Constitutional:      General: He is not in acute distress.    Appearance: Normal appearance. He is not ill-appearing.  HENT:     Head: Normocephalic and atraumatic.  Pulmonary:     Effort: Pulmonary effort is normal.  Genitourinary:    Rectum: No mass or external hemorrhoid.     Comments: Stool strongly black. Skin:    General: Skin is warm and dry.  Neurological:     Mental Status: He is alert and oriented to person, place, and time. Mental status is at baseline.  Psychiatric:        Mood and Affect: Mood normal.        Behavior: Behavior normal.        Thought Content: Thought content normal.        Judgment: Judgment normal.       Results:   Studies obtained and personally reviewed by me:    Labs:       Component Value Date/Time   NA 138 05/13/2022 0909   NA 139 01/16/2021 0822   K 4.0 05/13/2022  0909   CL 100 05/13/2022 0909   CO2 28 05/13/2022 0909   GLUCOSE 109 (H) 05/13/2022 0909   BUN 18 05/13/2022 0909   BUN 12 01/16/2021 0822   CREATININE 1.00 05/13/2022 0909   CALCIUM 9.7 05/13/2022 0909   PROT 7.6 05/13/2022 0909   ALBUMIN 3.2 (L) 09/06/2020 1038   AST 14 05/13/2022 0909   ALT 12 05/13/2022 0909   ALKPHOS 48 09/06/2020 1038   BILITOT 1.3 (H) 05/13/2022 0909   GFRNONAA >60 12/03/2021 2008   GFRNONAA 73 04/21/2020 0904   GFRAA 85 04/21/2020 0904     Lab Results  Component Value Date   WBC 3.6 (L) 05/13/2022   HGB 13.0 (L) 05/13/2022   HCT 39.4 05/13/2022   MCV 95.9 05/13/2022   PLT 229 05/13/2022    Lab Results  Component Value Date   CHOL 130 05/13/2022   HDL 51 05/13/2022   LDLCALC 62 05/13/2022   TRIG 85 05/13/2022   CHOLHDL 2.5 05/13/2022    Lab Results  Component Value Date   HGBA1C 6.2 (H)  05/13/2022     Lab Results  Component Value Date   TSH 0.564 09/06/2020     Lab Results  Component Value Date   PSA 0.48 05/13/2022   PSA 0.54 05/01/2021   PSA 0.56 04/21/2020      Assessment & Plan:   Rectal bleeding: Referred back to Dr. Carlean Purl, who performed his last colonoscopy in 2021.  He will be seen tomorrow.    I,Alexander Ruley,acting as a Education administrator for Elby Showers, MD.,have documented all relevant documentation on the behalf of Elby Showers, MD,as directed by  Elby Showers, MD while in the presence of Elby Showers, MD.

## 2022-08-13 NOTE — Patient Instructions (Signed)
Refer back to GI for rectal bleeding and borborygymi.

## 2022-08-13 NOTE — Telephone Encounter (Signed)
[name] [name] [phone]  [name] called to say this morning he seen some spots of blood when he went to the bathroom. Then a little while ago he went again and he had a lot of blood and it was a lot of blood, with a soft stool, he did not have to strain, no pain, he did have some stomach gurgling.

## 2022-08-13 NOTE — Telephone Encounter (Signed)
After talking with DR Renold Genta I scheduled him for this afternoon.

## 2022-08-15 ENCOUNTER — Other Ambulatory Visit: Payer: Medicare Other

## 2022-08-15 ENCOUNTER — Ambulatory Visit: Payer: Medicare Other | Admitting: Internal Medicine

## 2022-08-15 ENCOUNTER — Other Ambulatory Visit (HOSPITAL_COMMUNITY): Payer: Self-pay

## 2022-08-15 ENCOUNTER — Encounter: Payer: Self-pay | Admitting: Internal Medicine

## 2022-08-15 VITALS — BP 134/78 | HR 77 | Ht 70.0 in | Wt 280.0 lb

## 2022-08-15 DIAGNOSIS — R198 Other specified symptoms and signs involving the digestive system and abdomen: Secondary | ICD-10-CM

## 2022-08-15 DIAGNOSIS — R1013 Epigastric pain: Secondary | ICD-10-CM | POA: Diagnosis not present

## 2022-08-15 DIAGNOSIS — K649 Unspecified hemorrhoids: Secondary | ICD-10-CM

## 2022-08-15 MED ORDER — HYDROCORTISONE ACETATE 25 MG RE SUPP
25.0000 mg | Freq: Two times a day (BID) | RECTAL | 0 refills | Status: DC
  Filled 2022-08-15: qty 12, 6d supply, fill #0

## 2022-08-15 MED ORDER — HYDROCORTISONE ACETATE 25 MG RE SUPP: 25.0000 mg | Freq: Every day | RECTAL | 1 refills | Status: DC

## 2022-08-15 NOTE — Patient Instructions (Signed)
Your provider has requested that you go to the basement level for lab work before leaving today. Press "B" on the elevator. The lab is located at the first door on the left as you exit the elevator.   Due to recent changes in healthcare laws, you may see the results of your imaging and laboratory studies on MyChart before your provider has had a chance to review them.  We understand that in some cases there may be results that are confusing or concerning to you. Not all laboratory results come back in the same time frame and the provider may be waiting for multiple results in order to interpret others.  Please give [place] 48 hours in order for your provider to thoroughly review all the results before contacting the office for clarification of your results.   We have provided you with a printed rx for Anusol-HC suppositories to take to [facility]. Use the [place] code.   I appreciate the opportunity to care for you. Silvano Rusk, MD, [facility]

## 2022-08-15 NOTE — Progress Notes (Signed)
[name] [name] 72 y.o. 04/01/1951 [id number]  Assessment & Plan:   Encounter Diagnoses  Name Primary?   Bleeding hemorrhoids Yes   Borborygmi    Dyspepsia     Treat hemorrhoids with Anusol HC suppositories.  If they are persistent issue we could consider hemorrhoidal ligation.  Evaluate borborygmi and dyspepsia with a fecal elastase test.  Question if he could have occult pancreatic exocrine insufficiency.  Continue as needed Pepto-Bismol for now.  Further plans pending that result.  CC: Elby Showers, MD    Subjective:   Chief Complaint: Rectal bleeding and borborygmi  HPI 72 year old African-American man with a history of colon polyps, diabetes mellitus type 2 who had rectal bleeding on 2 occasions 2 days ago.  This first bowel movement on February 6, he had a streak of red blood that he had another bowel movement with a large amount of red blood.  This is a new issue for him.  He did not have any change in bowel habits otherwise.  He did not notice any other anorectal symptoms and has had stool since then without bleeding.  He saw Dr. Renold Genta on February 6 and said that everything was "okay".  She referred him here for follow-up as I know him from prior colonoscopy.  He also has fairly frequent borborygmi issues.  Some indigestion and upset stomach type symptoms at times.  He will take Pepto-Bismol with relief.  He takes Nexium chronically and is on meloxicam for arthritis pain.  Bowel movements are soft.  He has not had any dose changes in metformin and the symptoms presented well after the initiation of that medication. No Known Allergies Current Meds  Medication Sig   ALPRAZolam (XANAX) 0.5 MG tablet Take 1/2-1 tablet (0.25-0.5 mg total) in the morning, 1/2-1 tablet (0.25-0.5 mg total) in the early afternoon and 1 tablet (0.5 mg total) at bedtime  for anxiety   amLODipine (NORVASC) 10 MG tablet Take 1 tablet (10 mg total) by mouth daily.   aspirin EC 81 MG tablet  Take 1 tablet (81 mg total) by mouth 2 (two) times daily after a meal.   atorvastatin (LIPITOR) 10 MG tablet TAKE 1 TABLET BY MOUTH ONCE DAILY. (Patient taking differently: Take 10 mg by mouth daily.)   carvedilol (COREG) 25 MG tablet Take 1 tablet (25 mg total) by mouth 2 (two) times daily.   esomeprazole (NEXIUM) 40 MG capsule Take 1 capsule (40 mg total) by mouth daily.   furosemide (LASIX) 40 MG tablet Take 1 tablet (40 mg total) by mouth daily.   hydrocortisone (ANUSOL-HC) 25 MG suppository Place 1 suppository (25 mg total) rectally at bedtime.   meloxicam (MOBIC) 15 MG tablet Take 1 tablet (15 mg total) by mouth daily.   metFORMIN (GLUCOPHAGE-XR) 500 MG 24 hr tablet Take 1 tablet (500 mg total) by mouth 2 (two) times daily.   sacubitril-valsartan (ENTRESTO) 97-103 MG Take 1 tablet by mouth 2 (two) times daily.   spironolactone (ALDACTONE) 25 MG tablet Take 1 tablet (25 mg total) by mouth 2 (two) times daily.   zolpidem (AMBIEN) 5 MG tablet Take 1 tablet (5 mg total) by mouth at bedtime as needed for sleep.       Past Medical History:  Diagnosis Date   Anxiety    Arthritis    Bradycardia    hx of  in 30's medications changed   Diabetes mellitus without complication (H[facility])    DJD (degenerative joint disease) of knee  Left   Hx of adenomatous colonic polyps 12/2019   2 diminutive   Hypertension    Past Surgical History:  Procedure Laterality Date   CARDIAC CATHETERIZATION  07/23/2018   CHOLECYSTECTOMY N/A 02/04/2016   Procedure: LAPAROSCOPIC CHOLECYSTECTOMY;  Surgeon: Leighton Ruff, MD;  Location: [facility];  Service: General;  Laterality: N/A;   COLONOSCOPY  12/13/2019   ERCP N/A 02/02/2016   Procedure: ENDOSCOPIC RETROGRADE CHOLANGIOPANCREATOGRAPHY (ERCP);  Surgeon: [name], MD;  Location: [facility];  Service: Endoscopy;  Laterality: N/A;   HERNIA REPAIR     RIGHT/LEFT HEART CATH AND CORONARY ANGIOGRAPHY N/A 07/23/2018   Procedure: RIGHT/LEFT HEART CATH AND CORONARY  ANGIOGRAPHY;  Surgeon: Belva Crome, MD;  Location: [facility];  Service: Cardiovascular;  Laterality: N/A;   SPINE SURGERY     TOTAL KNEE ARTHROPLASTY Left 07/28/2018   Procedure: TOTAL KNEE ARTHROPLASTY;  Surgeon: Melrose Nakayama, MD;  Location: [facility];  Service: Orthopedics;  Laterality: Left;   TOTAL KNEE ARTHROPLASTY Right 09/05/2020   Procedure: RIGHT TOTAL KNEE ARTHROPLASTY;  Surgeon: Melrose Nakayama, MD;  Location: [facility];  Service: Orthopedics;  Laterality: Right;   Social History   Social History Narrative   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   He is married   No alcohol tobacco or drug use      family history includes Hypertension in his mother; Stroke in his mother.   Review of Systems As per HPI  Objective:   Physical Exam BP 134/78   Pulse 77   Ht '5\' 10"'$  (1.778 m)   Wt 280 lb (127 kg)   BMI 40.18 kg/m  Well-developed well-nourished obese black man in no acute distress Abd obese, soft small umbilical hernia  Rectal - NL anoderm and DRE  Anoscopy Gr 1 internal hemorrhoids w/ stigmata recent bleeding

## 2022-08-16 ENCOUNTER — Other Ambulatory Visit: Payer: Medicare Other

## 2022-08-16 DIAGNOSIS — R198 Other specified symptoms and signs involving the digestive system and abdomen: Secondary | ICD-10-CM | POA: Diagnosis not present

## 2022-08-16 DIAGNOSIS — R1013 Epigastric pain: Secondary | ICD-10-CM | POA: Diagnosis not present

## 2022-08-23 LAB — PANCREATIC ELASTASE, FECAL: Pancreatic Elastase-1, Stool: >500 mcg/g

## 2022-09-02 ENCOUNTER — Other Ambulatory Visit (HOSPITAL_COMMUNITY): Payer: Self-pay

## 2022-09-02 ENCOUNTER — Other Ambulatory Visit: Payer: Self-pay | Admitting: Internal Medicine

## 2022-09-02 ENCOUNTER — Other Ambulatory Visit: Payer: Self-pay

## 2022-09-02 MED ORDER — ATORVASTATIN CALCIUM 10 MG PO TABS
ORAL_TABLET | Freq: Every day | ORAL | 3 refills | Status: DC
  Filled 2022-09-02: qty 90, 90d supply, fill #0
  Filled 2022-11-27: qty 90, 90d supply, fill #1
  Filled 2023-02-24: qty 90, 90d supply, fill #2

## 2022-09-17 ENCOUNTER — Other Ambulatory Visit: Payer: Self-pay | Admitting: Interventional Cardiology

## 2022-09-17 ENCOUNTER — Other Ambulatory Visit: Payer: Self-pay

## 2022-09-17 ENCOUNTER — Other Ambulatory Visit (HOSPITAL_COMMUNITY): Payer: Self-pay

## 2022-09-17 MED ORDER — ENTRESTO 97-103 MG PO TABS
1.0000 | ORAL_TABLET | Freq: Two times a day (BID) | ORAL | 0 refills | Status: DC
  Filled 2022-09-17 (×2): qty 180, 90d supply, fill #0

## 2022-09-23 ENCOUNTER — Telehealth: Payer: Self-pay | Admitting: Internal Medicine

## 2022-09-23 NOTE — Telephone Encounter (Signed)
[name] [name] is going to call Dr Rhona Raider back and try the celebrex first to see if it helps, he will call back if it does not.

## 2022-09-23 NOTE — Telephone Encounter (Signed)
[name] [phone]  [name] called to say he has been having some left side pain around his rib cage area, he can take Tylenol arthritis 500 mg and it helps sometime, his meloxicam does not help. Dr Rhona Raider told him he has a lot of arthritis all over his body and suggested he may need to start on celebrex, He would like to come in and see you for this new pain.

## 2022-09-24 ENCOUNTER — Other Ambulatory Visit (HOSPITAL_COMMUNITY): Payer: Self-pay

## 2022-09-24 MED ORDER — CELECOXIB 200 MG PO CAPS
200.0000 mg | ORAL_CAPSULE | Freq: Every day | ORAL | 0 refills | Status: DC | PRN
  Filled 2022-09-24: qty 30, 30d supply, fill #0

## 2022-09-30 ENCOUNTER — Other Ambulatory Visit: Payer: Self-pay

## 2022-10-01 ENCOUNTER — Other Ambulatory Visit (HOSPITAL_COMMUNITY): Payer: Self-pay

## 2022-10-01 ENCOUNTER — Other Ambulatory Visit: Payer: Self-pay

## 2022-10-01 ENCOUNTER — Other Ambulatory Visit: Payer: Self-pay | Admitting: Interventional Cardiology

## 2022-10-01 ENCOUNTER — Other Ambulatory Visit: Payer: Self-pay | Admitting: Internal Medicine

## 2022-10-01 ENCOUNTER — Other Ambulatory Visit (HOSPITAL_BASED_OUTPATIENT_CLINIC_OR_DEPARTMENT_OTHER): Payer: Self-pay

## 2022-10-01 MED ORDER — CARVEDILOL 25 MG PO TABS
25.0000 mg | ORAL_TABLET | Freq: Two times a day (BID) | ORAL | 1 refills | Status: DC
  Filled 2022-10-01: qty 180, 90d supply, fill #0
  Filled 2022-12-19: qty 180, 90d supply, fill #1

## 2022-10-14 ENCOUNTER — Other Ambulatory Visit: Payer: Self-pay

## 2022-10-14 ENCOUNTER — Other Ambulatory Visit (HOSPITAL_COMMUNITY): Payer: Self-pay

## 2022-10-14 MED ORDER — CELECOXIB 200 MG PO CAPS
200.0000 mg | ORAL_CAPSULE | Freq: Every day | ORAL | 1 refills | Status: DC
  Filled 2022-10-14 – 2022-10-21 (×2): qty 90, 90d supply, fill #0
  Filled 2023-01-14: qty 90, 90d supply, fill #1

## 2022-10-15 ENCOUNTER — Other Ambulatory Visit (HOSPITAL_COMMUNITY): Payer: Self-pay

## 2022-10-17 ENCOUNTER — Telehealth: Payer: Self-pay | Admitting: Internal Medicine

## 2022-10-17 ENCOUNTER — Other Ambulatory Visit: Payer: Self-pay

## 2022-10-17 DIAGNOSIS — R140 Abdominal distension (gaseous): Secondary | ICD-10-CM

## 2022-10-17 DIAGNOSIS — K5900 Constipation, unspecified: Secondary | ICD-10-CM

## 2022-10-17 NOTE — Progress Notes (Signed)
[place]

## 2022-10-17 NOTE — Telephone Encounter (Signed)
[name] [name] [phone]  [name] [name] called to say he is still having belly pain, when he takes Pepto bismol it helps. His stool has been ,mostly loose for the last week. He did state when he goes to the bathroom the pain levels out. He is still questioning if it is the metformin causing the issues or what he can do to figure it out.

## 2022-10-17 NOTE — Telephone Encounter (Signed)
Called patient and let him know to go and have xray and then to come in afternoon for appointment, he verbalized understanding

## 2022-10-18 ENCOUNTER — Ambulatory Visit (INDEPENDENT_AMBULATORY_CARE_PROVIDER_SITE_OTHER): Payer: Medicare Other | Admitting: Internal Medicine

## 2022-10-18 ENCOUNTER — Encounter: Payer: Self-pay | Admitting: Internal Medicine

## 2022-10-18 ENCOUNTER — Ambulatory Visit
Admission: RE | Admit: 2022-10-18 | Discharge: 2022-10-18 | Disposition: A | Discharge status: Home / Self Care | Payer: Medicare Other | Source: Ambulatory Visit | Attending: Internal Medicine | Admitting: Internal Medicine

## 2022-10-18 ENCOUNTER — Other Ambulatory Visit (HOSPITAL_COMMUNITY): Payer: Self-pay

## 2022-10-18 VITALS — BP 136/74 | HR 70 | Temp 98.5°F | Ht 70.0 in | Wt 279.1 lb

## 2022-10-18 DIAGNOSIS — E118 Type 2 diabetes mellitus with unspecified complications: Secondary | ICD-10-CM

## 2022-10-18 DIAGNOSIS — R140 Abdominal distension (gaseous): Secondary | ICD-10-CM

## 2022-10-18 DIAGNOSIS — R1013 Epigastric pain: Secondary | ICD-10-CM | POA: Diagnosis not present

## 2022-10-18 DIAGNOSIS — R109 Unspecified abdominal pain: Secondary | ICD-10-CM | POA: Diagnosis not present

## 2022-10-18 MED ORDER — DICYCLOMINE HCL 20 MG PO TABS
20.0000 mg | ORAL_TABLET | Freq: Every day | ORAL | 1 refills | Status: DC | PRN
  Filled 2022-10-18: qty 30, 30d supply, fill #0

## 2022-10-18 NOTE — Progress Notes (Signed)
Patient Care Team: Margaree Mackintosh, MD as PCP - General (Internal Medicine) Lyn Records, MD (Inactive) as PCP - Cardiology (Cardiology)  Visit Date: 10/18/22  Subjective:    Patient ID: [name] [name] , Male   DOB: 05-Dec-1950, 72 y.o.    MRN: [id number]   72 y.o. Male presents today for intermittent epigastric dyspepsia, abdominal bloating, constipation, borborygmus. Eating a soft foods/liquid diet. Drinks black coffee. Denies abdominal pain. Stools are soft. 10/18/22 X-ray abdomen showed unremarkable bowel gas pattern, possible right-sided nephrolithiasis that can be assessed with noncontrast CT. Followed by gastroenterologist, Dr. Leone Payor. Taking metformin 500 mg twice daily. Does not check blood glucose at home. Eating eggs with toast and bacon in the morning. Has been taking Pepto-Bismol.  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  HENT:  Negative for congestion.   Eyes:  Negative for blurred vision.  Respiratory:  Negative for cough and shortness of breath.   Cardiovascular:  Negative for chest pain, palpitations and leg swelling.  Gastrointestinal:  Negative for abdominal pain and vomiting.       (+) Dyspepsia  Musculoskeletal:  Negative for back pain.  Skin:  Negative for rash.  Neurological:   Negative for loss of consciousness and headaches.        Objective:   Vitals: BP 136/74   Pulse 70   Temp 98.5 F (36.9 C) (Tympanic)   Ht  (1.778 m)   Wt 279 lb 1.9 oz (126.6 kg)   SpO2 99%   BMI 40.05 kg/m    Physical Exam Vitals and nursing note reviewed.  Constitutional:      General: He is not in acute distress.    Appearance: Normal appearance. He is not ill-appearing.  HENT:     Head: Normocephalic and atraumatic.  Pulmonary:     Effort: Pulmonary effort is normal.  Abdominal:     General: Bowel sounds are normal.     Palpations: There is no hepatomegaly or splenomegaly.     Tenderness: There is no abdominal tenderness.  Skin:    General: Skin is warm and dry.  Neurological:     Mental Status: He is alert and oriented to person, place, and time. Mental status is at baseline.  Psychiatric:        Mood and Affect: Mood normal.        Behavior: Behavior normal.        Thought Content: Thought content normal.        Judgment: Judgment normal.       Results:   Studies obtained and  personally reviewed by me:  10/18/22 X-ray abdomen showed unremarkable bowel gas pattern, possible right-sided nephrolithiasis that can be assessed with noncontrast CT.   Labs:       Component Value Date/Time   NA 138 05/13/2022 0909   NA 139 01/16/2021 0822   K 4.0 05/13/2022 0909   CL 100 05/13/2022 0909   CO2 28 05/13/2022 0909   GLUCOSE 109 (H) 05/13/2022 0909   BUN 18 05/13/2022 0909   BUN 12 01/16/2021 0822   CREATININE 1.00 05/13/2022 0909   CALCIUM 9.7 05/13/2022 0909   PROT 7.6 05/13/2022 0909   ALBUMIN 3.2 (L) 09/06/2020 1038   AST 14 05/13/2022 0909   ALT 12 05/13/2022 0909   ALKPHOS 48 09/06/2020 1038   BILITOT 1.3 (H) 05/13/2022 0909   GFRNONAA >60 12/03/2021 2008   GFRNONAA 73 04/21/2020 0904   GFRAA 85 04/21/2020 0904     Lab Results  Component Value Date   WBC 3.6 (L) 05/13/2022   HGB 13.0 (L) 05/13/2022   HCT 39.4 05/13/2022   MCV 95.9  05/13/2022   PLT 229 05/13/2022    Lab Results  Component Value Date   CHOL 130 05/13/2022   HDL 51 05/13/2022   LDLCALC 62 05/13/2022   TRIG 85 05/13/2022   CHOLHDL 2.5 05/13/2022    Lab Results  Component Value Date   HGBA1C 6.2 (H) 05/13/2022     Lab Results  Component Value Date   TSH 0.564 09/06/2020     Lab Results  Component Value Date   PSA 0.48 05/13/2022   PSA 0.54 05/01/2021   PSA 0.56 04/21/2020      Assessment & Plan:   Dyspepsia: reduce metformin 500 mg to once daily. Prescribed Bentyl 20 mg as needed for abdominal bloating. Maintain high fiber diet. Hemoccult negative.  Colonoscopy was done in 2021 by Dr. Leone Payor and was able to small adenomatous polyps with recall recommended in 2028.    I,Alexander Ruley,acting as a Neurosurgeon for Margaree Mackintosh, MD.,have documented all relevant documentation on the behalf of Margaree Mackintosh, MD,as directed by  Margaree Mackintosh, MD while in the presence of Margaree Mackintosh, MD.   I, Margaree Mackintosh, MD, have reviewed all documentation for this visit. The documentation on 11/03/22 for the exam, diagnosis, procedures, and orders are all accurate and complete.

## 2022-10-19 LAB — MICROALBUMIN / CREATININE URINE RATIO
Creatinine, Urine: 209 mg/dL (ref 20–320)
Microalb Creat Ratio: 10 mg/g creat (ref <30)
Microalb, Ur: 2 mg/dL

## 2022-10-21 ENCOUNTER — Other Ambulatory Visit (HOSPITAL_COMMUNITY): Payer: Self-pay

## 2022-10-29 ENCOUNTER — Other Ambulatory Visit: Payer: Self-pay | Admitting: Internal Medicine

## 2022-10-29 ENCOUNTER — Other Ambulatory Visit (HOSPITAL_COMMUNITY): Payer: Self-pay

## 2022-10-29 MED ORDER — ZOLPIDEM TARTRATE 5 MG PO TABS
5.0000 mg | ORAL_TABLET | Freq: Every evening | ORAL | 1 refills | Status: DC | PRN
  Filled 2022-10-29 – 2023-01-27 (×2): qty 90, 90d supply, fill #0

## 2022-11-03 NOTE — Patient Instructions (Addendum)
Reduce metformin to 500 mg daily to see if dyspepsia improves.  May also take Dicyclomine(Bentyl) if needed for abdominal bloating.  Maintain high-fiber diet.  Hemoccult test today is negative.  Colonoscopy is up-to-date.

## 2022-11-05 NOTE — Progress Notes (Signed)
Patient Care Team: Margaree Mackintosh, MD as PCP - General (Internal Medicine) Lyn Records, MD (Inactive) as PCP - Cardiology (Cardiology)  Visit Date: 11/05/22  Subjective:    Patient ID: [name] [name] , Male   DOB: 1951-02-24, 72 y.o.    MRN: [id number]   72 y.o. Male presents today for a 6 month visit. Patient has a past medical history of anxiety, arthritis, bradycardia, Type 2 diabetes mellitus, degenerative joint disease, adenomatous colonic polyps 2021, hypertension.  History of hypertension treated with amlodipine 10 mg daily, carvedilol 25 mg twice daily.  History of musculoskeletal pain treated with celecoxib 200 mg once daily as needed.  History of abdominal bloating treated with dicyclomine 20 mg daily as needed.  History of lower extremity edema treated with furosemide 40 mg daily.  History of Type 2 diabetes mellitus treated with metformin 500 mg twice daily.  History of heart disease treated with sacubitril-valsartan 97-103 mg twice daily, sprinolactone 25 mg twice daily.  History of insomnia treated with zolpidem 5 mg daily at bedtime.  History of GERD treated with esomeprazole 40 mg daily.   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 No chest pain or SOB      Objective:   Vitals: BP 102/64 pulse 79 T 98 degrees pulse ox 99%  weight 271 pounds BMI 38.88   Physical Exam Skin: warm and dry, Nodes none. Chest is clear. Cor RRR without ectopy or murmur No LE edema   Results:   Studies obtained and personally reviewed by me:  LFTs are stable. Lipid panel normal.No microalbuminuria. Hgb AIC is 6.4 % and was 6.2 % in November.   Labs:       Component Value Date/Time   NA 138 05/13/2022 0909   NA 139 01/16/2021 0822   K 4.0 05/13/2022 0909   CL 100 05/13/2022 0909   CO2 28 05/13/2022 0909   GLUCOSE 109 (H) 05/13/2022 0909   BUN 18 05/13/2022 0909   BUN 12 01/16/2021 0822   CREATININE 1.00 05/13/2022 0909   CALCIUM 9.7 05/13/2022 0909   PROT 7.6 05/13/2022 0909   ALBUMIN 3.2 (L) 09/06/2020 1038   AST 14 05/13/2022 0909   ALT 12 05/13/2022 0909   ALKPHOS 48 09/06/2020 1038   BILITOT 1.3 (H) 05/13/2022 0909   GFRNONAA >60 12/03/2021 2008   GFRNONAA 73 04/21/2020 0904   GFRAA 85 04/21/2020 0904     Lab Results  Component Value Date   WBC 3.6 (L) 05/13/2022   HGB 13.0 (L) 05/13/2022   HCT 39.4 05/13/2022   MCV 95.9 05/13/2022   PLT 229  05/13/2022    Lab Results  Component Value Date   CHOL 130 05/13/2022   HDL 51 05/13/2022   LDLCALC 62 05/13/2022   TRIG 85 05/13/2022   CHOLHDL 2.5 05/13/2022    Lab Results  Component Value Date   HGBA1C 6.2 (H) 05/13/2022     Lab Results  Component Value Date   TSH 0.564 09/06/2020     Lab Results  Component Value Date   PSA 0.48 05/13/2022   PSA 0.54 05/01/2021   PSA 0.56 04/21/2020         Assessment & Plan:   Pure hypercholesterolemia-stable with statin medication-Lipitor 10 mg daily  History of anxiety-has Xanax on hand to take as needed  Type 2 diabetes mellitus-hemoglobin A1c 6.4%.  Continue with diet neck and metformin.  Reevaluate in 6 months.  Consider newer agents for weight loss and control of glucose at next visit.  Currently on metformin.  History of combined systolic and diastolic congestive heart failure treated with  Entresto, Lasix, spironolactone, and carvedilol.  Has appointment with Dr. Anne Fu in September.  Dr. Katrinka Blazing has retired.  Insomnia treated with Ambien  GE reflux treated with Nexium  Irritable bowel syndrome treated with Bentyl  Musculoskeletal pain treating sparingly with Mobic  History of bilateral knee arthroplasty  Essential hypertension -stable on multiple drugs.  Followed by cardiology    I,Alexander Ruley,acting as a scribe for Margaree Mackintosh, MD.,have documented all relevant documentation on the behalf of Margaree Mackintosh, MD,as directed by  Margaree Mackintosh, MD while in the presence of Margaree Mackintosh, MD.   I, Margaree Mackintosh, MD, have reviewed all documentation for this visit. The documentation on 12/11/22 for the exam, diagnosis, procedures, and orders are all accurate and complete.

## 2022-11-08 ENCOUNTER — Telehealth: Payer: Self-pay

## 2022-11-08 NOTE — Telephone Encounter (Signed)
Patient called stating he has had heartburn for a few days and has been taking Rx esomeprazole (NEXIUM) 40 MG capsule but he's not getting the relief he use to and would like to know if a different Rx could be sent to [address]

## 2022-11-08 NOTE — Telephone Encounter (Signed)
Patient informed of the message

## 2022-11-11 ENCOUNTER — Telehealth: Payer: Self-pay | Admitting: Internal Medicine

## 2022-11-11 ENCOUNTER — Other Ambulatory Visit: Payer: Medicare Other

## 2022-11-11 DIAGNOSIS — E7800 Pure hypercholesterolemia, unspecified: Secondary | ICD-10-CM

## 2022-11-11 DIAGNOSIS — E118 Type 2 diabetes mellitus with unspecified complications: Secondary | ICD-10-CM | POA: Diagnosis not present

## 2022-11-11 NOTE — Telephone Encounter (Signed)
PT experiencing discomfort and indigestion after eating and is looking for options for relief. Please advise.

## 2022-11-11 NOTE — Telephone Encounter (Signed)
Pt stated that he has been having to take tums with addition to his prescribed Nexium. Pt stated that his PCP recommended for him to take zantac 150 mg daily. Pt was notified to follow that recommendations. Pt notified to refrain from greasy, fatty foods, and remain sitting after meals and not to eat late in the evening. . Pt was scheduled for an office visit on 12/13/2022 at 1:30 PM with Salome Arnt PA. Pt made aware.   Pt verbalized understanding with all questions answered.

## 2022-11-12 ENCOUNTER — Encounter: Payer: Self-pay | Admitting: Internal Medicine

## 2022-11-12 ENCOUNTER — Other Ambulatory Visit (HOSPITAL_COMMUNITY): Payer: Self-pay

## 2022-11-12 ENCOUNTER — Ambulatory Visit (INDEPENDENT_AMBULATORY_CARE_PROVIDER_SITE_OTHER): Payer: Medicare Other | Admitting: Internal Medicine

## 2022-11-12 VITALS — BP 102/64 | HR 79 | Temp 98.0°F | Ht 70.0 in | Wt 271.0 lb

## 2022-11-12 DIAGNOSIS — E785 Hyperlipidemia, unspecified: Secondary | ICD-10-CM

## 2022-11-12 DIAGNOSIS — I10 Essential (primary) hypertension: Secondary | ICD-10-CM | POA: Diagnosis not present

## 2022-11-12 DIAGNOSIS — I5042 Chronic combined systolic (congestive) and diastolic (congestive) heart failure: Secondary | ICD-10-CM | POA: Diagnosis not present

## 2022-11-12 DIAGNOSIS — Z8659 Personal history of other mental and behavioral disorders: Secondary | ICD-10-CM

## 2022-11-12 DIAGNOSIS — E7800 Pure hypercholesterolemia, unspecified: Secondary | ICD-10-CM

## 2022-11-12 DIAGNOSIS — E118 Type 2 diabetes mellitus with unspecified complications: Secondary | ICD-10-CM | POA: Diagnosis not present

## 2022-11-12 DIAGNOSIS — E1169 Type 2 diabetes mellitus with other specified complication: Secondary | ICD-10-CM | POA: Diagnosis not present

## 2022-11-12 DIAGNOSIS — Z96653 Presence of artificial knee joint, bilateral: Secondary | ICD-10-CM | POA: Diagnosis not present

## 2022-11-12 DIAGNOSIS — Z6838 Body mass index (BMI) 38.0-38.9, adult: Secondary | ICD-10-CM

## 2022-11-12 LAB — LIPID PANEL
Cholesterol: 126 mg/dL (ref <200)
HDL: 53 mg/dL (ref >=40)
LDL Cholesterol (Calc): 55 mg/dL (calc)
Non-HDL Cholesterol (Calc): 73 mg/dL (calc) (ref <130)
Total CHOL/HDL Ratio: 2.4 (calc) (ref <5.0)
Triglycerides: 92 mg/dL (ref <150)

## 2022-11-12 LAB — HEPATIC FUNCTION PANEL
AG Ratio: 1.5 (calc) (ref 1.0–2.5)
ALT: 11 U/L (ref 9–46)
AST: 14 U/L (ref 10–35)
Albumin: 4.6 g/dL (ref 3.6–5.1)
Alkaline phosphatase (APISO): 77 U/L (ref 35–144)
Bilirubin, Direct: 0.3 mg/dL — ABNORMAL HIGH (ref 0.0–0.2)
Globulin: 3 g/dL (calc) (ref 1.9–3.7)
Indirect Bilirubin: 1.3 mg/dL (calc) — ABNORMAL HIGH (ref 0.2–1.2)
Total Bilirubin: 1.6 mg/dL — ABNORMAL HIGH (ref 0.2–1.2)
Total Protein: 7.6 g/dL (ref 6.1–8.1)

## 2022-11-12 LAB — HEMOGLOBIN A1C
Hgb A1c MFr Bld: 6.4 % of total Hgb — ABNORMAL HIGH (ref <5.7)
Mean Plasma Glucose: 137 mg/dL
eAG (mmol/L): 7.6 mmol/L

## 2022-11-12 LAB — MICROALBUMIN / CREATININE URINE RATIO
Creatinine, Urine: 35 mg/dL (ref 20–320)
Microalb, Ur: <0.2 mg/dL

## 2022-11-12 MED ORDER — DICYCLOMINE HCL 20 MG PO TABS
20.0000 mg | ORAL_TABLET | Freq: Two times a day (BID) | ORAL | 1 refills | Status: DC
  Filled 2022-11-12: qty 180, 90d supply, fill #0
  Filled 2023-02-03: qty 180, 90d supply, fill #1

## 2022-11-12 NOTE — Patient Instructions (Addendum)
Return to taking metformin  twice a day.  This may help Hgb AIC improve a bit.  Have refilled  (Dicyclomine) and may take twice daily for irritable bowel symptoms.  Continue diet and exercise efforts.  Appointment with Dr. Anne Fu in September for follow-up on heart issues.  Medicare wellness visit and annual exam scheduled for November 2024.  Continue current medications.  It was a pleasure to see you today.

## 2022-11-27 ENCOUNTER — Other Ambulatory Visit: Payer: Self-pay | Admitting: Internal Medicine

## 2022-11-27 ENCOUNTER — Other Ambulatory Visit (HOSPITAL_COMMUNITY): Payer: Self-pay

## 2022-11-27 ENCOUNTER — Other Ambulatory Visit: Payer: Self-pay

## 2022-11-27 MED ORDER — ESOMEPRAZOLE MAGNESIUM 40 MG PO CPDR
40.0000 mg | DELAYED_RELEASE_CAPSULE | Freq: Every day | ORAL | 3 refills | Status: DC
  Filled 2022-11-27: qty 90, 90d supply, fill #0

## 2022-11-27 MED ORDER — AMLODIPINE BESYLATE 10 MG PO TABS
10.0000 mg | ORAL_TABLET | Freq: Every day | ORAL | 3 refills | Status: DC
  Filled 2022-11-27: qty 90, 90d supply, fill #0
  Filled 2023-02-24: qty 90, 90d supply, fill #1

## 2022-11-28 ENCOUNTER — Other Ambulatory Visit (HOSPITAL_COMMUNITY): Payer: Self-pay

## 2022-11-28 ENCOUNTER — Telehealth: Payer: Self-pay | Admitting: Cardiology

## 2022-11-28 MED ORDER — SPIRONOLACTONE 25 MG PO TABS
25.0000 mg | ORAL_TABLET | Freq: Two times a day (BID) | ORAL | 1 refills | Status: DC
  Filled 2022-11-28: qty 180, 90d supply, fill #0
  Filled 2023-02-24: qty 180, 90d supply, fill #1

## 2022-11-28 NOTE — Telephone Encounter (Signed)
*  STAT* If patient is at the pharmacy, call can be transferred to refill team.   1. Which medications need to be refilled? (please list name of each medication and dose if known) spironolactone (ALDACTONE) 25 MG tablet   2. Which pharmacy/location (including street and city if local pharmacy) is medication to be sent to? [place] [facility]   3. Do they need a 30 day or 90 day supply? 90 day   Pt is currently out of medication

## 2022-11-28 NOTE — Telephone Encounter (Signed)
Pt's medication was sent to pt's pharmacy as requested. Confirmation received.  °

## 2022-11-29 ENCOUNTER — Other Ambulatory Visit: Payer: Self-pay

## 2022-11-29 ENCOUNTER — Emergency Department (HOSPITAL_COMMUNITY)
Admission: EM | Admit: 2022-11-29 | Discharge: 2022-11-29 | Disposition: A | Discharge status: Home / Self Care | Payer: Medicare Other | Attending: Emergency Medicine | Admitting: Emergency Medicine

## 2022-11-29 DIAGNOSIS — Z7982 Long term (current) use of aspirin: Secondary | ICD-10-CM | POA: Insufficient documentation

## 2022-11-29 DIAGNOSIS — I10 Essential (primary) hypertension: Secondary | ICD-10-CM | POA: Diagnosis not present

## 2022-11-29 DIAGNOSIS — K219 Gastro-esophageal reflux disease without esophagitis: Secondary | ICD-10-CM | POA: Diagnosis not present

## 2022-11-29 DIAGNOSIS — R12 Heartburn: Secondary | ICD-10-CM | POA: Diagnosis present

## 2022-11-29 MED ORDER — MAALOX MAX 400-400-40 MG/5ML PO SUSP
5.0000 mL | Freq: Four times a day (QID) | ORAL | 0 refills | Status: DC | PRN
  Filled 2022-11-29: qty 100, 5d supply, fill #0

## 2022-11-29 MED ORDER — MAALOX MAX 400-400-40 MG/5ML PO SUSP: 5.0000 mL | Freq: Four times a day (QID) | ORAL | 0 refills | Status: AC | PRN

## 2022-11-29 MED ORDER — ALUM & MAG HYDROXIDE-SIMETH 200-200-20 MG/5ML PO SUSP
30.0000 mL | Freq: Once | ORAL | Status: AC
  Administered 2022-11-29: 30 mL via ORAL
  Filled 2022-11-29: qty 30

## 2022-11-29 NOTE — ED Provider Notes (Signed)
[place] EMERGENCY DEPARTMENT AT [facility] Provider Note   CSN: [id number] Arrival date & time: 11/29/22  1649     History  Chief Complaint  Patient presents with   Heartburn    [name] [name] is a 72 y.o. male.  72 year old male with a past medical history of reflux presents to the ED with a chief complaint of reflux which has been ongoing for the last several days.  Patient recently had his medication changed to a generic Nexium, reports this is the only medication that his insurance will cover.  He reports his symptoms are not improving despite taking this medication, in addition he has tried to take some Zantac's, however this is very difficult for him to obtain as most pharmacies are trying to discontinue this.  He reports he had a snack around 2 PM, began to feel bloating along his epigastric region.  None of this travels up to his chest.  He denies any vomiting, no nausea, no abdominal pain. No chest pain or sob.   The history is provided by the patient.  Heartburn Pertinent negatives include no chest pain, no abdominal pain and no shortness of breath.       Home Medications Prior to Admission medications   Medication Sig Start Date End Date Taking? Authorizing Provider  alum & mag hydroxide-simeth (MAALOX MAX) 400-400-40 MG/5ML suspension Take 5 mLs by mouth every 6 (six) hours as needed for up to 7 days for indigestion. 11/29/22 12/06/22 Yes Alezander Dimaano, Leonie Douglas, PA-C  ALPRAZolam Prudy Feeler) 0.5 MG tablet Take 1/2-1 tablet (0.25-0.5 mg total) in the morning, 1/2-1 tablet (0.25-0.5 mg total) in the early afternoon and 1 tablet (0.5 mg total) at bedtime  for anxiety 08/02/22   Margaree Mackintosh, MD  amLODipine (NORVASC) 10 MG tablet Take 1 tablet (10 mg total) by mouth daily. 11/27/22   Margaree Mackintosh, MD  aspirin EC 81 MG tablet Take 1 tablet (81 mg total) by mouth 2 (two) times daily after a meal. 09/05/20   Elodia Florence, PA-C  atorvastatin (LIPITOR) 10 MG tablet TAKE 1 TABLET  BY MOUTH ONCE DAILY. 09/02/22 09/02/23  Margaree Mackintosh, MD  carvedilol (COREG) 25 MG tablet Take 1 tablet (25 mg total) by mouth 2 (two) times daily. 10/01/22   Jake Bathe, MD  celecoxib (CELEBREX) 200 MG capsule Take 1 capsule by mouth once a day with food as needed for pain 10/14/22     dicyclomine (BENTYL) 20 MG tablet Take 1 tablet (20 mg total) by mouth 2 (two) times daily. 11/12/22   Margaree Mackintosh, MD  esomeprazole (NEXIUM) 40 MG capsule Take 1 capsule (40 mg total) by mouth daily. 11/27/22   Margaree Mackintosh, MD  furosemide (LASIX) 40 MG tablet Take 1 tablet (40 mg total) by mouth daily. 02/13/22   Margaree Mackintosh, MD  meloxicam (MOBIC) 15 MG tablet Take 1 tablet (15 mg total) by mouth daily. 08/02/22   Margaree Mackintosh, MD  metFORMIN (GLUCOPHAGE-XR) 500 MG 24 hr tablet Take 1 tablet (500 mg total) by mouth 2 (two) times daily. Patient taking differently: Take 500 mg by mouth daily. 05/16/22   Margaree Mackintosh, MD  sacubitril-valsartan (ENTRESTO) 97-103 MG Take 1 tablet by mouth 2 (two) times daily. 09/17/22   Jake Bathe, MD  spironolactone (ALDACTONE) 25 MG tablet Take 1 tablet (25 mg total) by mouth 2 (two) times daily. 11/28/22   Jake Bathe, MD  Tdap Leda Min) 5-2.5-18.5 LF-MCG/0.5 injection  Inject into the muscle. 03/18/22   Judyann Munson, MD  zolpidem (AMBIEN) 5 MG tablet Take 1 tablet (5 mg total) by mouth at bedtime as needed for sleep. 10/29/22   Margaree Mackintosh, MD      Allergies    Patient has no known allergies.    Review of Systems   Review of Systems  Constitutional:  Negative for fever.  Respiratory:  Negative for shortness of breath.   Cardiovascular:  Negative for chest pain.  Gastrointestinal:  Positive for heartburn. Negative for abdominal pain and vomiting.    Physical Exam Updated Vital Signs BP (!) 159/82 (BP Location: Left Arm)   Pulse 70   Temp 97.9 F (36.6 C) (Oral)   Resp 18   Ht 5\' 10"  (1.778 m)   Wt 123.4 kg   SpO2 100%   BMI 39.03 kg/m  Physical  Exam Vitals and nursing note reviewed.  Constitutional:      Appearance: He is well-developed.  HENT:     Head: Normocephalic and atraumatic.  Eyes:     General: No scleral icterus.    Pupils: Pupils are equal, round, and reactive to light.  Cardiovascular:     Heart sounds: Normal heart sounds.  Pulmonary:     Effort: Pulmonary effort is normal.     Breath sounds: Normal breath sounds. No wheezing.  Chest:     Chest wall: No tenderness.  Abdominal:     General: Bowel sounds are normal. There is no distension.     Palpations: Abdomen is soft.     Tenderness: There is no abdominal tenderness.  Musculoskeletal:        General: No tenderness or deformity.     Cervical back: Normal range of motion.  Skin:    General: Skin is warm and dry.  Neurological:     Mental Status: He is alert and oriented to person, place, and time.     ED Results / Procedures / Treatments   Labs (all labs ordered are listed, but only abnormal results are displayed) Labs Reviewed - No data to display  EKG None  Radiology No results found.  Procedures Procedures    Medications Ordered in ED Medications  alum & mag hydroxide-simeth (MAALOX/MYLANTA) 200-200-20 MG/5ML suspension 30 mL (30 mLs Oral Given 11/29/22 1738)    ED Course/ Medical Decision Making/ A&P                             Medical Decision Making Risk OTC drugs.     Patient presents to the ED with a chief complaint of reflux that the patient has acutely for the last couple of days.  Underlying history of GERD, he is followed by GI, reports he has not had endoscopy was diagnosed with reflux, previously was taking a PPI however now got switched to Nexium generic because his pharmacy would not cover it therefore he is now taking this medication.  He reports no improvement in his symptoms.  He denies any chest pain, EKG obtained is normal sinus rhythm.  He does not have any shortness of breath today either.  He was given some  Maalox with resolution in his symptoms, ongoing dyspepsia has been going on for several months, he is not an appointment with GI on June 7, we discussed adding Maalox to his regimen, he will need to follow-up closely with gastroenterology in order to obtain permanent resolution of his reflux.  We discussed  dietary changes.  Patient is agreeable to plan and treatment, hemodynamically stable for discharge.  Portions of this note were generated with Scientist, clinical (histocompatibility and immunogenetics). Dictation errors may occur despite best attempts at proofreading.   Final Clinical Impression(s) / ED Diagnoses Final diagnoses:  Chronic GERD    Rx / DC Orders ED Discharge Orders          Ordered    alum & mag hydroxide-simeth (MAALOX MAX) 400-400-40 MG/5ML suspension  Every 6 hours PRN        11/29/22 1820              Claude Manges, PA-C 11/29/22 1832    Arby Barrette, MD 11/29/22 [phone]

## 2022-11-29 NOTE — ED Triage Notes (Signed)
Pt arrived via POV. Pt states their new GERD meds (esomeprazole) is not working for them and their symptoms have returned.

## 2022-11-29 NOTE — Discharge Instructions (Addendum)
You were given a prescription for Maalox in order to help with your dyspepsia.  This is only a temporary measure to help with your discomfort.  You will need to follow-up with your gastroenterologist.  If you experience any worsening symptoms return to the emergency department.

## 2022-11-30 ENCOUNTER — Other Ambulatory Visit (HOSPITAL_COMMUNITY): Payer: Self-pay

## 2022-12-03 ENCOUNTER — Other Ambulatory Visit (HOSPITAL_COMMUNITY): Payer: Self-pay

## 2022-12-04 ENCOUNTER — Telehealth: Payer: Self-pay | Admitting: Internal Medicine

## 2022-12-04 NOTE — Telephone Encounter (Signed)
error 

## 2022-12-05 ENCOUNTER — Other Ambulatory Visit: Payer: Self-pay

## 2022-12-06 NOTE — Telephone Encounter (Signed)
[name] [name] [phone]  [name] called to say he was still having problems and he had run out of the medication he had gotten in the emergency room, he does not have an appointment with GI until 12/13/2022. He called there and they said they could not help him until he was seen. I let him know DR Lenord Fellers was out of office until Monday. I did let him know that the medication they gave him was Maalox/Mylanta Max and he could possible get something like that over the counter until Monday that would help. He will call back then if he is still having problems and if this did not work.

## 2022-12-13 ENCOUNTER — Other Ambulatory Visit: Payer: Self-pay

## 2022-12-13 ENCOUNTER — Encounter: Payer: Self-pay | Admitting: Physician Assistant

## 2022-12-13 ENCOUNTER — Ambulatory Visit: Payer: Medicare Other | Admitting: Physician Assistant

## 2022-12-13 ENCOUNTER — Other Ambulatory Visit (HOSPITAL_COMMUNITY): Payer: Self-pay

## 2022-12-13 VITALS — BP 124/72 | HR 70 | Ht 70.0 in | Wt 270.0 lb

## 2022-12-13 DIAGNOSIS — K219 Gastro-esophageal reflux disease without esophagitis: Secondary | ICD-10-CM | POA: Diagnosis not present

## 2022-12-13 DIAGNOSIS — R1013 Epigastric pain: Secondary | ICD-10-CM | POA: Diagnosis not present

## 2022-12-13 MED ORDER — PANTOPRAZOLE SODIUM 40 MG PO TBEC
40.0000 mg | DELAYED_RELEASE_TABLET | Freq: Two times a day (BID) | ORAL | 5 refills | Status: DC
  Filled 2022-12-13: qty 60, 30d supply, fill #0
  Filled 2023-01-06: qty 60, 30d supply, fill #1
  Filled 2023-02-03: qty 180, 90d supply, fill #2

## 2022-12-13 NOTE — Patient Instructions (Signed)
We have sent the following medications to your pharmacy for you to pick up at your convenience: Pantoprazole   Stop your Esomeprazole.   I appreciate the opportunity to care for you. Hyacinth Meeker PA-C

## 2022-12-13 NOTE — Progress Notes (Signed)
Chief Complaint: Follow-up after ER visit for GERD  HPI:    [name] [name] is a 72 year old male with a past medical history as listed below including bradycardia, diabetes and multiple others, known to Dr. Leone Payor, who was referred to me by Margaree Mackintosh, MD for follow-up after being seen in the ER for GERD.    12/13/2019 colonoscopy with one 4 mm polyp in the sigmoid colon, one 2 mm polyp in the distal transverse colon, single solitary ulcer at the ileocecal valve and diverticulosis in the sigmoid colon.  Pathology showed 2 diminutive adenomas and recall was placed for 7 years.    08/15/2022 patient seen in clinic by Dr. Leone Payor for bleeding hemorrhoids treated with Anusol suppositories and borborygmi and dyspepsia.  He had a fecal elastase test for possible pancreatic exocrine insufficiency.  Told to continue Pepto-Bismol.    10/18/2022 abdominal x-ray with unremarkable bowel gas pattern and possible right-sided nephrolithiasis.    11/11/2022 LFTs with increased bilirubin at 1.6, indirect bilirubin elevated 1.3.    11/29/2022 patient seen in the ER for reflux which been going on for several days.  Apparently had recently had his medication changed to generic Nexium as it is the only thing his insurance would cover and his symptoms were not improving.  He had also tried some Zantac's.  Patient had resolution of his symptoms with Maalox.    Today, patient presents to clinic and explains that a couple months ago he started with epigastric pain immediately after eating.  This was so bad that he eventually went to the ER where they gave him the Maalox Mylanta combination.  This did help when he was taking it 3-4 times a day but then he ran out.  Tells me that he was on a different medication but they changed him to generic Nexium and it really just does not help him.  He takes it an hour before breakfast but immediately after eating he has pain.  Tells me the pain last for about 2 hours and then typically goes  away, this time is somewhat lessened by over-the-counter Mylanta now.  Tells me if he sits back after eating it is worse.  Denies any nausea or vomiting.    Denies fever, chills, weight loss or change in bowel habits.  Past Medical History:  Diagnosis Date   Anxiety    Arthritis    Bradycardia    hx of  in 30's medications changed   Diabetes mellitus without complication (HCC)    DJD (degenerative joint disease) of knee    Left   Hx of adenomatous colonic polyps 12/2019   2 diminutive   Hypertension     Past Surgical History:  Procedure Laterality Date   CARDIAC CATHETERIZATION  07/23/2018   CHOLECYSTECTOMY N/A 02/04/2016   Procedure: LAPAROSCOPIC CHOLECYSTECTOMY;  Surgeon: Romie Levee, MD;  Location: [facility];  Service: General;  Laterality: N/A;   COLONOSCOPY  12/13/2019   ERCP N/A 02/02/2016   Procedure: ENDOSCOPIC RETROGRADE CHOLANGIOPANCREATOGRAPHY (ERCP);  Surgeon: Iva Boop, MD;  Location: [facility];  Service: Endoscopy;  Laterality: N/A;   HERNIA REPAIR     RIGHT/LEFT HEART CATH AND CORONARY ANGIOGRAPHY N/A 07/23/2018   Procedure: RIGHT/LEFT HEART CATH AND CORONARY ANGIOGRAPHY;  Surgeon: Lyn Records, MD;  Location: [facility];  Service: Cardiovascular;  Laterality: N/A;   SPINE SURGERY     TOTAL KNEE ARTHROPLASTY Left 07/28/2018   Procedure: TOTAL KNEE ARTHROPLASTY;  Surgeon: Marcene Corning, MD;  Location:  [facility];  Service: Orthopedics;  Laterality: Left;   TOTAL KNEE ARTHROPLASTY Right 09/05/2020   Procedure: RIGHT TOTAL KNEE ARTHROPLASTY;  Surgeon: Marcene Corning, MD;  Location: [facility];  Service: Orthopedics;  Laterality: Right;    Current Outpatient Medications  Medication Sig Dispense Refill   ALPRAZolam (XANAX) 0.5 MG tablet Take 1/2-1 tablet (0.25-0.5 mg total) in the morning, 1/2-1 tablet (0.25-0.5 mg total) in the early afternoon and 1 tablet (0.5 mg total) at bedtime  for anxiety 90 tablet 3   amLODipine (NORVASC) 10 MG tablet Take 1 tablet (10 mg  total) by mouth daily. 90 tablet 3   aspirin EC 81 MG tablet Take 1 tablet (81 mg total) by mouth 2 (two) times daily after a meal. 60 tablet 0   atorvastatin (LIPITOR) 10 MG tablet TAKE 1 TABLET BY MOUTH ONCE DAILY. 90 tablet 3   carvedilol (COREG) 25 MG tablet Take 1 tablet (25 mg total) by mouth 2 (two) times daily. 180 tablet 1   celecoxib (CELEBREX) 200 MG capsule Take 1 capsule by mouth once a day with food as needed for pain 90 capsule 1   dicyclomine (BENTYL) 20 MG tablet Take 1 tablet (20 mg total) by mouth 2 (two) times daily. 180 tablet 1   esomeprazole (NEXIUM) 40 MG capsule Take 1 capsule (40 mg total) by mouth daily. 90 capsule 3   furosemide (LASIX) 40 MG tablet Take 1 tablet (40 mg total) by mouth daily. 90 tablet 3   meloxicam (MOBIC) 15 MG tablet Take 1 tablet (15 mg total) by mouth daily. 90 tablet 1   metFORMIN (GLUCOPHAGE-XR) 500 MG 24 hr tablet Take 1 tablet (500 mg total) by mouth 2 (two) times daily. (Patient taking differently: Take 500 mg by mouth daily.) 180 tablet 3   sacubitril-valsartan (ENTRESTO) 97-103 MG Take 1 tablet by mouth 2 (two) times daily. 180 tablet 0   spironolactone (ALDACTONE) 25 MG tablet Take 1 tablet (25 mg total) by mouth 2 (two) times daily. 180 tablet 1   Tdap (BOOSTRIX) 5-2.5-18.5 LF-MCG/0.5 injection Inject into the muscle. 0.5 mL 0   zolpidem (AMBIEN) 5 MG tablet Take 1 tablet (5 mg total) by mouth at bedtime as needed for sleep. 90 tablet 1   No current facility-administered medications for this visit.    Allergies as of 12/13/2022   (No Known Allergies)    Family History  Problem Relation Age of Onset   Stroke Mother    Hypertension Mother    Colon cancer Neg Hx    Stomach cancer Neg Hx    Rectal cancer Neg Hx    Pancreatic cancer Neg Hx     Social History   Socioeconomic History   Marital status: Married    Spouse name: Not on file   Number of children: Not on file   Years of education: Not on file   Highest education  level: Not on file  Occupational History   Not on file  Tobacco Use   Smoking status: Never   Smokeless tobacco: Never  Vaping Use   Vaping Use: Never used  Substance and Sexual Activity   Alcohol use: No   Drug use: No   Sexual activity: Yes  Other Topics Concern   Not on file  Social History Narrative   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   He is married   No alcohol tobacco or drug use      Social Determinants of Corporate investment banker  Strain: Not on file  Food Insecurity: Not on file  Transportation Needs: Not on file  Physical Activity: Not on file  Stress: Not on file  Social Connections: Not on file  Intimate Partner Violence: Not on file    Review of Systems:    Constitutional: No weight loss, fever or chills Cardiovascular: No chest pain Respiratory: No SOB Gastrointestinal: See HPI and otherwise negative   Physical Exam:  Vital signs: BP 124/72   Pulse 70   Ht 5\' 10"  (1.778 m)   Wt 270 lb (122.5 kg)   SpO2 98%   BMI 38.74 kg/m    Constitutional:   Pleasant AA male appears to be in NAD, Well developed, Well nourished, alert and cooperative Respiratory: Respirations even and unlabored. Lungs clear to auscultation bilaterally.   No wheezes, crackles, or rhonchi.  Cardiovascular: Normal S1, S2. No MRG. Regular rate and rhythm. No peripheral edema, cyanosis or pallor.  Gastrointestinal:  Soft, nondistended, moderate epigastric TTP no rebound or guarding. Normal bowel sounds. No appreciable masses or hepatomegaly. Rectal:  Not performed.  Psychiatric: Demonstrates good judgement and reason without abnormal affect or behaviors.  RELEVANT LABS AND IMAGING: CBC    Component Value Date/Time   WBC 3.6 (L) 05/13/2022 0909   RBC 4.11 (L) 05/13/2022 0909   HGB 13.0 (L) 05/13/2022 0909   HCT 39.4 05/13/2022 0909   PLT 229 05/13/2022 0909   MCV 95.9 05/13/2022 0909   MCH 31.6 05/13/2022 0909   MCHC 33.0 05/13/2022 0909   RDW 12.3 05/13/2022  0909   LYMPHSABS 1,721 05/13/2022 0909   MONOABS 0.8 09/06/2020 1038   EOSABS 101 05/13/2022 0909   BASOSABS 50 05/13/2022 0909    CMP     Component Value Date/Time   NA 138 05/13/2022 0909   NA 139 01/16/2021 0822   K 4.0 05/13/2022 0909   CL 100 05/13/2022 0909   CO2 28 05/13/2022 0909   GLUCOSE 109 (H) 05/13/2022 0909   BUN 18 05/13/2022 0909   BUN 12 01/16/2021 0822   CREATININE 1.00 05/13/2022 0909   CALCIUM 9.7 05/13/2022 0909   PROT 7.6 11/11/2022 0939   ALBUMIN 3.2 (L) 09/06/2020 1038   AST 14 11/11/2022 0939   ALT 11 11/11/2022 0939   ALKPHOS 48 09/06/2020 1038   BILITOT 1.6 (H) 11/11/2022 0939   GFRNONAA >60 12/03/2021 2008   GFRNONAA 73 04/21/2020 0904   GFRAA 85 04/21/2020 0904    Assessment: 1.  Epigastric pain: Started about 2 months ago with an increase in reflux symptoms, better with Maalox/Mylanta; likely gastritis 2.  GERD: Chronic for the patient, it is not controlled on Omeprazole 40 mg once daily  Plan: 1.  Today we will stop patient's Esomeprazole.  Instead changed to Pantoprazole 40 mg twice daily, 30-60 minutes before breakfast and dinner.  Prescribed #60 with 5 refills. 2.  Patient can continue Mylanta as needed in between. 3.  Reviewed antireflux diet and lifestyle modifications. 4.  Told the patient to check in with me in a week if he is not doing better on this new medicine then would add Famotidine 40 mg nightly. 5.  Patient was scheduled to follow-up with me in 2 to 3 months.  If he is not better at that time then would recommend an EGD.  He will call prior to then if he is having problems.  Hyacinth Meeker, PA-C  Gastroenterology 12/13/2022, 1:37 PM  Cc: Margaree Mackintosh, MD

## 2022-12-19 ENCOUNTER — Other Ambulatory Visit: Payer: Self-pay

## 2022-12-19 ENCOUNTER — Other Ambulatory Visit: Payer: Self-pay | Admitting: Cardiology

## 2022-12-19 ENCOUNTER — Other Ambulatory Visit (HOSPITAL_COMMUNITY): Payer: Self-pay

## 2022-12-19 MED ORDER — ENTRESTO 97-103 MG PO TABS
1.0000 | ORAL_TABLET | Freq: Two times a day (BID) | ORAL | 1 refills | Status: DC
  Filled 2022-12-19: qty 60, 30d supply, fill #0
  Filled 2023-01-14: qty 180, 90d supply, fill #1

## 2022-12-23 ENCOUNTER — Telehealth: Payer: Self-pay | Admitting: Physician Assistant

## 2022-12-23 NOTE — Telephone Encounter (Signed)
Called and spoke with patient. He states that reflux symptoms in the morning and evenings have been controlled on Protonix 40 mg BID. Pt does experience some breakthrough heartburn in the afternoons. I explained to patient that he can continue Mylanta PRN in between doses of Protonix. I explained to patient that his diet can also cause him to have breakthrough symptoms despite medication treatment. We reviewed antireflux diet and lifestyle modifications. Pt will modify his diet and use Mylanta PRN. He will call us if he has any additional concerns prior to his follow up appt. Pt verbalized understanding and had no concerns at the end of the call.

## 2022-12-23 NOTE — Telephone Encounter (Signed)
Patient experiencing heart burn , requesting to speak with a nurse. Please advise.  Thank you

## 2023-01-06 ENCOUNTER — Other Ambulatory Visit: Payer: Self-pay | Admitting: Internal Medicine

## 2023-01-06 ENCOUNTER — Other Ambulatory Visit: Payer: Self-pay

## 2023-01-06 ENCOUNTER — Other Ambulatory Visit (HOSPITAL_COMMUNITY): Payer: Self-pay

## 2023-01-06 MED ORDER — ALPRAZOLAM 0.5 MG PO TABS
ORAL_TABLET | ORAL | 3 refills | Status: DC
  Filled 2023-01-06 (×2): qty 90, 30d supply, fill #0
  Filled 2023-02-24: qty 90, 30d supply, fill #1
  Filled 2023-04-16: qty 90, 30d supply, fill #2
  Filled 2023-05-26: qty 90, 30d supply, fill #3

## 2023-01-07 ENCOUNTER — Ambulatory Visit (HOSPITAL_COMMUNITY): Payer: Medicare Other | Attending: Internal Medicine

## 2023-01-07 DIAGNOSIS — I5042 Chronic combined systolic (congestive) and diastolic (congestive) heart failure: Secondary | ICD-10-CM | POA: Insufficient documentation

## 2023-01-07 LAB — ECHOCARDIOGRAM COMPLETE
Area-P 1/2: 2.81 cm2
Est EF: 50
S' Lateral: 4.05 cm

## 2023-01-14 ENCOUNTER — Other Ambulatory Visit: Payer: Self-pay

## 2023-01-22 ENCOUNTER — Other Ambulatory Visit (HOSPITAL_COMMUNITY): Payer: Self-pay

## 2023-01-22 ENCOUNTER — Other Ambulatory Visit: Payer: Self-pay

## 2023-01-22 MED ORDER — FAMOTIDINE 40 MG PO TABS
40.0000 mg | ORAL_TABLET | Freq: Every day | ORAL | 2 refills | Status: DC
  Filled 2023-01-22 (×2): qty 30, 30d supply, fill #0
  Filled 2023-02-17: qty 60, 60d supply, fill #1

## 2023-01-22 NOTE — Telephone Encounter (Signed)
Patient is calling requesting a call back from a nurse,he said he is in severe pain due to hi reflux he is not been able to eat nothing and he is losing weight. Please advise

## 2023-01-22 NOTE — Addendum Note (Signed)
Addended by: Missy Sabins on: 01/22/2023 03:00 PM   Modules accepted: Orders

## 2023-01-22 NOTE — Telephone Encounter (Signed)
Returned call to patient. Pt reports that he has been feeling a fullness in the bottom of his throat and it feels like food gets stuck below his sternum. This typically happens with his lunch and evening meal. Does not happen when he eats breakfast or softer foods. Pt takes Protonix 40 mg 1 hour before breakfast and dinner. He is following an antireflux diet and taking Mylanta PRN but he does not want to take this long term. Follow up is scheduled with you at the end of August. Pt concerned and wanting to know if we could proceed with EGD at this time? He has been evaluated by cardiology. Recent Echo on 01/07/23. Thanks

## 2023-01-22 NOTE — Telephone Encounter (Signed)
Called and spoke with patient regarding recommendations. Patient will continue Protonix 40 mg BID before meals in addition to Famotidine 40 mg at bedtime. Pt is aware that his symptoms will be reassessed at the time of his f/u appt. At that time Victorino Dike will decide if patient needs to proceed with EGD. Pt verbalized understanding and had no concerns at the end of the call.  Famotidine prescription sent to [facility] per pt request.

## 2023-01-22 NOTE — Telephone Encounter (Signed)
Lets add Famotidine 40 mg nightly first.  I would like him to follow-up with me prior to scheduling EGD.   Thanks, JL L

## 2023-01-27 ENCOUNTER — Other Ambulatory Visit (HOSPITAL_COMMUNITY): Payer: Self-pay

## 2023-01-27 ENCOUNTER — Other Ambulatory Visit: Payer: Self-pay

## 2023-02-03 ENCOUNTER — Other Ambulatory Visit: Payer: Self-pay

## 2023-02-07 ENCOUNTER — Encounter: Payer: Self-pay | Admitting: Internal Medicine

## 2023-02-07 ENCOUNTER — Other Ambulatory Visit (HOSPITAL_COMMUNITY): Payer: Self-pay

## 2023-02-07 ENCOUNTER — Telehealth: Payer: Self-pay | Admitting: Internal Medicine

## 2023-02-07 ENCOUNTER — Telehealth (INDEPENDENT_AMBULATORY_CARE_PROVIDER_SITE_OTHER): Payer: Medicare Other | Admitting: Internal Medicine

## 2023-02-07 VITALS — BP 119/76 | Temp 97.0°F | Ht 70.0 in | Wt 261.0 lb

## 2023-02-07 DIAGNOSIS — U071 COVID-19: Secondary | ICD-10-CM

## 2023-02-07 DIAGNOSIS — E1169 Type 2 diabetes mellitus with other specified complication: Secondary | ICD-10-CM

## 2023-02-07 DIAGNOSIS — E118 Type 2 diabetes mellitus with unspecified complications: Secondary | ICD-10-CM

## 2023-02-07 DIAGNOSIS — I10 Essential (primary) hypertension: Secondary | ICD-10-CM

## 2023-02-07 DIAGNOSIS — Z8659 Personal history of other mental and behavioral disorders: Secondary | ICD-10-CM

## 2023-02-07 DIAGNOSIS — E7800 Pure hypercholesterolemia, unspecified: Secondary | ICD-10-CM

## 2023-02-07 DIAGNOSIS — I5042 Chronic combined systolic (congestive) and diastolic (congestive) heart failure: Secondary | ICD-10-CM

## 2023-02-07 MED ORDER — BENZONATATE 100 MG PO CAPS
100.0000 mg | ORAL_CAPSULE | Freq: Three times a day (TID) | ORAL | 0 refills | Status: DC | PRN
  Filled 2023-02-07: qty 30, 10d supply, fill #0

## 2023-02-07 MED ORDER — AZITHROMYCIN 250 MG PO TABS
ORAL_TABLET | ORAL | 0 refills | Status: AC
  Filled 2023-02-07: qty 6, 5d supply, fill #0

## 2023-02-07 NOTE — Patient Instructions (Signed)
We are sorry you are not feeling well.  Please take Zithromax Z-PAK 2 tabs day 1 followed by 1 tab days 2 through 5.  Take Tessalon Perles 100 mg by mouth up to 3 times daily as needed for cough.  Rest, stay well-hydrated and walk some to prevent atelectasis.  Call if you have any concerns or questions at all.  Quarantine for 5 days.

## 2023-02-07 NOTE — Telephone Encounter (Signed)
When [name] [name] tested herself for COVID, [name] [name] also tested because he was having chills for last 2 days and he also tested positive. Set him up for Video visit also.

## 2023-02-07 NOTE — Progress Notes (Signed)
Patient Care Team: Margaree Mackintosh, MD as PCP - General (Internal Medicine) Lyn Records, MD (Inactive) as PCP - Cardiology (Cardiology)  I connected with Pryor Montes on 02/07/23 at 12:14 PM by video enabled telemedicine visit and verified that I am speaking with the correct person using two identifiers.   I discussed the limitations, risks, security and privacy concerns of performing an evaluation and management service by telemedicine and the availability of in-person appointments. I also discussed with the patient that there may be a patient responsible charge related to this service. The patient expressed understanding and agreed to proceed.   Other persons participating in the visit and their role in the encounter: Medical scribe, Doylene Bode  Patient's location: Home  Provider's location: Clinic   I provided 20 minutes of face-to-face video visit time during this encounter, and > 50% was spent counseling as documented under my assessment & plan. He is identified by two identifiers, [name] [name], a patient of this practice. He is in his home and I am in my practice. He is agreeable to using this format today.  Chief Complaint: cough, sneezing   Subjective:    Patient ID: [name] [name] , Male    DOB: 1950-07-29, 72 y.o.    MRN: [id number]   72 y.o. Male presents today for mild cough, congestion and sneezing since 02/03/23. Symptoms improved with OTC cough medication. Denies sputum production. He attended a large church event on 01/31/23. At-home Covid-19 test positive.  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  HENT:  Positive for congestion.        (+) Sneezing  Eyes:  Negative for blurred vision.  Respiratory:  Positive for cough. Negative for sputum production and shortness of breath.   Cardiovascular:  Negative for chest pain, palpitations and leg swelling.  Gastrointestinal:  Negative for vomiting.  Musculoskeletal:  Negative for back pain.  Skin:  Negative for rash.  Neurological:  Negative for loss of consciousness and headaches.        Objective:   Vitals: BP 119/76   Temp (!) 97 F (36.1 C) (Temporal)   Ht 5\' 10"  (1.778 m)   Wt 261 lb (118.4 kg)   BMI 37.45 kg/m    Physical Exam Vitals and nursing note reviewed.  Constitutional:      General: He is not in acute distress.    Appearance: Normal appearance. He is not ill-appearing.  HENT:     Head: Normocephalic and atraumatic.  Pulmonary:     Effort:  Pulmonary effort is normal.  Skin:    General: Skin is warm and dry.  Neurological:     Mental Status: He is alert and oriented to person, place, and time. Mental status is at baseline.  Psychiatric:        Mood and Affect: Mood normal.        Behavior: Behavior normal.        Thought Content: Thought content normal.        Judgment: Judgment normal.       Results:   Studies obtained and personally reviewed by me:   Labs:       Component Value Date/Time   NA 138 05/13/2022 0909   NA 139 01/16/2021 0822   K 4.0 05/13/2022 0909   CL 100 05/13/2022 0909   CO2 28 05/13/2022 0909   GLUCOSE 109 (H) 05/13/2022 0909   BUN 18 05/13/2022 0909   BUN 12 01/16/2021 0822   CREATININE 1.00 05/13/2022 0909    CALCIUM 9.7 05/13/2022 0909   PROT 7.6 11/11/2022 0939   ALBUMIN 3.2 (L) 09/06/2020 1038   AST 14 11/11/2022 0939   ALT 11 11/11/2022 0939   ALKPHOS 48 09/06/2020 1038   BILITOT 1.6 (H) 11/11/2022 0939   GFRNONAA >60 12/03/2021 2008   GFRNONAA 73 04/21/2020 0904   GFRAA 85 04/21/2020 0904     Lab Results  Component Value Date   WBC 3.6 (L) 05/13/2022   HGB 13.0 (L) 05/13/2022   HCT 39.4 05/13/2022   MCV 95.9 05/13/2022   PLT 229 05/13/2022    Lab Results  Component Value Date   CHOL 126 11/11/2022   HDL 53 11/11/2022   LDLCALC 55 11/11/2022   TRIG 92 11/11/2022   CHOLHDL 2.4 11/11/2022    Lab Results  Component Value Date   HGBA1C 6.4 (H) 11/11/2022     Lab Results  Component Value Date   TSH 0.564 09/06/2020     Lab Results  Component Value Date   PSA 0.48 05/13/2022   PSA 0.54 05/01/2021   PSA 0.56 04/21/2020      Assessment & Plan:   Acute Covid-19 infection: Patient was asked about taking Paxlovid and declines. Prescribed Zithromax Z-Pak with directions to take two tabs day 1 followed by one tab days 2-5, Tessalon 100 mg by mouth up to  three times daily as needed for cough. Rest and stay well hydrated and walk some to prevent atelectasis. Call if new or worsening symptoms develop.    I,Alexander Ruley,acting as a Neurosurgeon for Margaree Mackintosh, MD.,have documented all relevant documentation on the behalf of Margaree Mackintosh, MD,as directed by  Margaree Mackintosh, MD while in the presence of Margaree Mackintosh, MD.   I, Margaree Mackintosh, MD, have reviewed all documentation for this visit. The documentation on 02/07/23 for the exam, diagnosis, procedures, and orders are all accurate and complete.

## 2023-02-11 ENCOUNTER — Other Ambulatory Visit: Payer: Self-pay

## 2023-02-17 ENCOUNTER — Other Ambulatory Visit: Payer: Self-pay

## 2023-02-24 ENCOUNTER — Other Ambulatory Visit: Payer: Self-pay | Admitting: Internal Medicine

## 2023-02-24 ENCOUNTER — Other Ambulatory Visit: Payer: Self-pay

## 2023-02-24 ENCOUNTER — Other Ambulatory Visit (HOSPITAL_COMMUNITY): Payer: Self-pay

## 2023-02-24 ENCOUNTER — Other Ambulatory Visit: Payer: Self-pay | Admitting: Cardiology

## 2023-02-24 MED ORDER — MELOXICAM 15 MG PO TABS
15.0000 mg | ORAL_TABLET | Freq: Every day | ORAL | 1 refills | Status: DC
  Filled 2023-02-24: qty 90, 90d supply, fill #0
  Filled 2023-05-26: qty 90, 90d supply, fill #1

## 2023-02-24 MED ORDER — CARVEDILOL 25 MG PO TABS
25.0000 mg | ORAL_TABLET | Freq: Two times a day (BID) | ORAL | 0 refills | Status: DC
  Filled 2023-02-24 – 2023-02-25 (×2): qty 60, 30d supply, fill #0

## 2023-02-24 MED ORDER — FUROSEMIDE 40 MG PO TABS
40.0000 mg | ORAL_TABLET | Freq: Every day | ORAL | 3 refills | Status: DC
  Filled 2023-02-24: qty 90, 90d supply, fill #0
  Filled 2023-05-26: qty 90, 90d supply, fill #1
  Filled 2023-08-20: qty 90, 90d supply, fill #2
  Filled 2023-11-18: qty 90, 90d supply, fill #3

## 2023-02-25 ENCOUNTER — Other Ambulatory Visit: Payer: Self-pay

## 2023-02-25 ENCOUNTER — Other Ambulatory Visit (HOSPITAL_COMMUNITY): Payer: Self-pay

## 2023-03-06 ENCOUNTER — Ambulatory Visit: Payer: Medicare Other | Admitting: Physician Assistant

## 2023-03-06 ENCOUNTER — Encounter: Payer: Self-pay | Admitting: Internal Medicine

## 2023-03-06 ENCOUNTER — Encounter: Payer: Self-pay | Admitting: Physician Assistant

## 2023-03-06 VITALS — BP 104/72 | HR 64 | Ht 70.0 in | Wt 261.4 lb

## 2023-03-06 DIAGNOSIS — R1013 Epigastric pain: Secondary | ICD-10-CM | POA: Diagnosis not present

## 2023-03-06 DIAGNOSIS — K219 Gastro-esophageal reflux disease without esophagitis: Secondary | ICD-10-CM | POA: Diagnosis not present

## 2023-03-06 NOTE — Patient Instructions (Addendum)
You have been scheduled for an endoscopy with Dr. Stan Head. Please follow written instructions given to you at your visit today.  If you use inhalers (even only as needed), please bring them with you on the day of your procedure.  If you take any of the following medications, they will need to be adjusted prior to your procedure:   DO NOT TAKE 7 DAYS PRIOR TO TEST- Trulicity (dulaglutide) Ozempic, Wegovy (semaglutide) Mounjaro (tirzepatide) Bydureon Bcise (exanatide extended release)  DO NOT TAKE 1 DAY PRIOR TO YOUR TEST Rybelsus (semaglutide) Adlyxin (lixisenatide) Victoza (liraglutide) Byetta (exanatide) ___________________________________________________________________________   Continue pantoprazole 40 mg twice a day and Famotidine daily at bedtime  Thank you for choosing [facility] and for entrusting me with your care, [name] Meeker, PA-C

## 2023-03-06 NOTE — Progress Notes (Signed)
Chief Complaint: Follow-up epigastric pain  HPI:    [name] [name] is a 72 year old African-American male, known to Dr. Leone Payor, with a past medical history as listed below including bradycardia, CHF (01/07/2023 echo with LVEF 52%), diabetes and multiple others, who presents to clinic today for follow-up of GERD and epigastric pain.    12/13/2019 colonoscopy with one 4 mm polyp in the sigmoid colon, one 2 mm polyp in the distal transverse colon, single solitary ulcer at the ileocecal valve and diverticulosis in the sigmoid colon.  Pathology showed 2 diminutive adenomas and recall was placed for 7 years.    08/15/2022 patient seen in clinic by Dr. Leone Payor for bleeding hemorrhoids treated with Anusol suppositories and borborygmi and dyspepsia.  He had a fecal elastase test for possible pancreatic exocrine insufficiency.  Told to continue Pepto-Bismol.    10/18/2022 abdominal x-ray with unremarkable bowel gas pattern and possible right-sided nephrolithiasis.    11/11/2022 LFTs with increased bilirubin at 1.6, indirect bilirubin elevated 1.3.    11/29/2022 patient seen in the ER for reflux which been going on for several days.  Apparently had recently had his medication changed to generic Nexium as it is the only thing his insurance would cover and his symptoms were not improving.  He had also tried some Zantac's.  Patient had resolution of his symptoms with Maalox.    12/13/2022 patient seen in clinic and at that time had started with epigastric pain a couple months ago.  Seen in the ER as above.  At that time patient changed from Esomeprazole to Pantoprazole 40 mg twice a day and continue Mylanta as needed.    12/23/2022 patient called and is not feeling much better.  Pepcid 40 mg nightly was added.    02/07/2023 patient came down with COVID.    Today, the patient explains that even though he is on Pantoprazole 40 twice daily and Pepcid 40 mg nightly he still has issues with reflux and feeling like food will not go down  his throat.  When this happens he will use Mylanta for 2 days and then feels some better and will be able to eat normally but then all of his symptoms just return.  He is growing weary of ongoing symptoms and would like it further evaluated.    Denies fever, chills or weight loss.  Past Medical History:  Diagnosis Date   Anxiety    Arthritis    Bradycardia    hx of  in 30's medications changed   Diabetes mellitus without complication (HCC)    DJD (degenerative joint disease) of knee    Left   Hx of adenomatous colonic polyps 12/2019   2 diminutive   Hypertension     Past Surgical History:  Procedure Laterality Date   CARDIAC CATHETERIZATION  07/23/2018   CARPAL TUNNEL RELEASE Bilateral 2023   CHOLECYSTECTOMY N/A 02/04/2016   Procedure: LAPAROSCOPIC CHOLECYSTECTOMY;  Surgeon: Romie Levee, MD;  Location: [facility];  Service: General;  Laterality: N/A;   COLONOSCOPY  12/13/2019   ERCP N/A 02/02/2016   Procedure: ENDOSCOPIC RETROGRADE CHOLANGIOPANCREATOGRAPHY (ERCP);  Surgeon: Iva Boop, MD;  Location: [facility];  Service: Endoscopy;  Laterality: N/A;   HERNIA REPAIR     RIGHT/LEFT HEART CATH AND CORONARY ANGIOGRAPHY N/A 07/23/2018   Procedure: RIGHT/LEFT HEART CATH AND CORONARY ANGIOGRAPHY;  Surgeon: Lyn Records, MD;  Location: [facility];  Service: Cardiovascular;  Laterality: N/A;   SPINE SURGERY     TOTAL KNEE ARTHROPLASTY  Left 07/28/2018   Procedure: TOTAL KNEE ARTHROPLASTY;  Surgeon: Marcene Corning, MD;  Location: [facility];  Service: Orthopedics;  Laterality: Left;   TOTAL KNEE ARTHROPLASTY Right 09/05/2020   Procedure: RIGHT TOTAL KNEE ARTHROPLASTY;  Surgeon: Marcene Corning, MD;  Location: [facility];  Service: Orthopedics;  Laterality: Right;    Current Outpatient Medications  Medication Sig Dispense Refill   ALPRAZolam (XANAX) 0.5 MG tablet Take 1/2-1 tablet (0.25-0.5 mg total) in the morning, 1/2-1 tablet (0.25-0.5 mg total) in the early afternoon and 1 tablet  (0.5 mg total) at bedtime  for anxiety 90 tablet 3   aluminum-magnesium hydroxide 200-200 MG/5ML suspension Take by mouth every 6 (six) hours as needed for indigestion.     amLODipine (NORVASC) 10 MG tablet Take 1 tablet (10 mg total) by mouth daily. 90 tablet 3   aspirin EC 81 MG tablet Take 1 tablet (81 mg total) by mouth 2 (two) times daily after a meal. 60 tablet 0   atorvastatin (LIPITOR) 10 MG tablet TAKE 1 TABLET BY MOUTH ONCE DAILY. 90 tablet 3   benzonatate (TESSALON) 100 MG capsule Take 1 capsule (100 mg total) by mouth 3 (three) times daily as needed for cough. 30 capsule 0   carvedilol (COREG) 25 MG tablet Take 1 tablet (25 mg total) by mouth 2 (two) times daily. Please keep upcoming appointment for further refills. Thank you 60 tablet 0   celecoxib (CELEBREX) 200 MG capsule Take 1 capsule by mouth once a day with food as needed for pain 90 capsule 1   dicyclomine (BENTYL) 20 MG tablet Take 1 tablet (20 mg total) by mouth 2 (two) times daily. 180 tablet 1   famotidine (PEPCID) 40 MG tablet Take 1 tablet (40 mg total) by mouth at bedtime. 30 tablet 2   furosemide (LASIX) 40 MG tablet Take 1 tablet (40 mg total) by mouth daily. 90 tablet 3   meloxicam (MOBIC) 15 MG tablet Take 1 tablet (15 mg total) by mouth daily. 90 tablet 1   metFORMIN (GLUCOPHAGE-XR) 500 MG 24 hr tablet Take 1 tablet (500 mg total) by mouth 2 (two) times daily. (Patient taking differently: Take 500 mg by mouth daily.) 180 tablet 3   pantoprazole (PROTONIX) 40 MG tablet Take 1 tablet (40 mg total) by mouth 2 (two) times daily before a meal. 60 tablet 5   sacubitril-valsartan (ENTRESTO) 97-103 MG Take 1 tablet by mouth 2 (two) times daily. 180 tablet 1   spironolactone (ALDACTONE) 25 MG tablet Take 1 tablet (25 mg total) by mouth 2 (two) times daily. 180 tablet 1   zolpidem (AMBIEN) 5 MG tablet Take 1 tablet (5 mg total) by mouth at bedtime as needed for sleep. 90 tablet 1   No current facility-administered medications  for this visit.    Allergies as of 03/06/2023   (No Known Allergies)    Family History  Problem Relation Age of Onset   Stroke Mother    Hypertension Mother    Colon cancer Neg Hx    Stomach cancer Neg Hx    Rectal cancer Neg Hx    Pancreatic cancer Neg Hx    Esophageal cancer Neg Hx     Social History   Socioeconomic History   Marital status: Married    Spouse name: Not on file   Number of children: Not on file   Years of education: Not on file   Highest education level: Not on file  Occupational History   Not on file  Tobacco Use   Smoking status: Never   Smokeless tobacco: Never  Vaping Use   Vaping status: Never Used  Substance and Sexual Activity   Alcohol use: No   Drug use: No   Sexual activity: Yes  Other Topics Concern   Not on file  Social History Narrative   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   He is married   No alcohol tobacco or drug use      Social Determinants of Corporate investment banker Strain: Not on file  Food Insecurity: Not on file  Transportation Needs: Not on file  Physical Activity: Not on file  Stress: Not on file  Social Connections: Not on file  Intimate Partner Violence: Not on file    Review of Systems:    Constitutional: No weight loss, fever or chills Cardiovascular: No chest pain Respiratory: No SOB  Gastrointestinal: See HPI and otherwise negative   Physical Exam:  Vital signs: BP 104/72 (BP Location: Left Arm, Patient Position: Sitting, Cuff Size: Large)   Pulse 64   Ht 5\' 10"  (1.778 m)   Wt 261 lb 6 oz (118.6 kg)   SpO2 98%   BMI 37.50 kg/m   Constitutional:   Pleasant overweight AA male appears to be in NAD, Well developed, Well nourished, alert and cooperative Respiratory: Respirations even and unlabored. Lungs clear to auscultation bilaterally.   No wheezes, crackles, or rhonchi.  Cardiovascular: Normal S1, S2. No MRG. Regular rate and rhythm. No peripheral edema, cyanosis or pallor.   Gastrointestinal:  Soft, nondistended, nontender. No rebound or guarding. Normal bowel sounds. No appreciable masses or hepatomegaly. Rectal:  Not performed.  Psychiatric: Demonstrates good judgement and reason without abnormal affect or behaviors.  RELEVANT LABS AND IMAGING: CBC    Component Value Date/Time   WBC 3.6 (L) 05/13/2022 0909   RBC 4.11 (L) 05/13/2022 0909   HGB 13.0 (L) 05/13/2022 0909   HCT 39.4 05/13/2022 0909   PLT 229 05/13/2022 0909   MCV 95.9 05/13/2022 0909   MCH 31.6 05/13/2022 0909   MCHC 33.0 05/13/2022 0909   RDW 12.3 05/13/2022 0909   LYMPHSABS 1,721 05/13/2022 0909   MONOABS 0.8 09/06/2020 1038   EOSABS 101 05/13/2022 0909   BASOSABS 50 05/13/2022 0909    CMP     Component Value Date/Time   NA 138 05/13/2022 0909   NA 139 01/16/2021 0822   K 4.0 05/13/2022 0909   CL 100 05/13/2022 0909   CO2 28 05/13/2022 0909   GLUCOSE 109 (H) 05/13/2022 0909   BUN 18 05/13/2022 0909   BUN 12 01/16/2021 0822   CREATININE 1.00 05/13/2022 0909   CALCIUM 9.7 05/13/2022 0909   PROT 7.6 11/11/2022 0939   ALBUMIN 3.2 (L) 09/06/2020 1038   AST 14 11/11/2022 0939   ALT 11 11/11/2022 0939   ALKPHOS 48 09/06/2020 1038   BILITOT 1.6 (H) 11/11/2022 0939   GFRNONAA >60 12/03/2021 2008   GFRNONAA 73 04/21/2020 0904   GFRAA 85 04/21/2020 0904    Assessment: 1.  Epigastric pain: Started about 4 months ago with an increase in reflux symptoms, Mylanta helps but nothing else even increase in PPI and H2 blocker; see below likely gastritis +/- H. pylori 2.  GERD: Chronic for the patient, not controlled on Omeprazole 40 mg once daily or Pantoprazole 40 twice daily plus Famotidine 40 nightly; consider gastritis +/- H. pylori  Plan: 1.  Continue Pantoprazole 40 twice daily and Famotidine 40 nightly for now.  2.  Scheduled patient for diagnostic EGD in the [facility] with Dr. Leone Payor.  Did provide the patient a detailed list risk of procedure and he agrees to proceed. Patient is  appropriate for endoscopic procedure(s) in the ambulatory ([facility]) setting.  3.  Patient to follow in clinic per recommendations from Dr. Leone Payor after time of procedure.  [name] Meeker, [name] [name] [facility] 03/06/2023, 2:23 PM  Cc: Margaree Mackintosh, MD

## 2023-03-08 ENCOUNTER — Encounter: Payer: Self-pay | Admitting: Certified Registered Nurse Anesthetist

## 2023-03-12 ENCOUNTER — Encounter: Payer: Self-pay | Admitting: Internal Medicine

## 2023-03-12 ENCOUNTER — Ambulatory Visit (AMBULATORY_SURGERY_CENTER): Payer: Medicare Other | Admitting: Internal Medicine

## 2023-03-12 VITALS — BP 107/59 | HR 62 | Temp 97.3°F | Resp 16 | Ht 70.0 in | Wt 261.0 lb

## 2023-03-12 DIAGNOSIS — R1013 Epigastric pain: Secondary | ICD-10-CM | POA: Diagnosis not present

## 2023-03-12 DIAGNOSIS — R1319 Other dysphagia: Secondary | ICD-10-CM

## 2023-03-12 MED ORDER — SODIUM CHLORIDE 0.9 % IV SOLN: 500.0000 mL | Freq: Once | INTRAVENOUS | Status: DC

## 2023-03-12 NOTE — Progress Notes (Signed)
History and Physical Interval Note:  03/12/2023 8:43 AM  [name] [name]  has presented today for endoscopic procedure(s), with the diagnosis of  Encounter Diagnoses  Name Primary?   Abdominal pain, epigastric Yes   Esophageal dysphagia   .  The various methods of evaluation and treatment have been discussed with the patient and/or family. After consideration of risks, benefits and other options for treatment, the patient has consented to  the endoscopic procedure(s).   The patient's history has been reviewed, patient examined, no change in status, stable for endoscopic procedure(s).  I have reviewed the patient's chart and labs.  Questions were answered to the patient's satisfaction.      Iva Boop, MD, [facility]

## 2023-03-12 NOTE — Op Note (Signed)
[facility] Patient Name: [name] [name] Procedure Date: 03/12/2023 8:39 AM MRN: [id number] Endoscopist: Iva Boop , MD, [id number] Age: 72 Referring MD:  Date of Birth: Jul 29, 1950 Gender: Male Account #: [id number] Procedure:                Upper GI endoscopy Indications:              Epigastric abdominal pain, Dyspepsia, Dysphagia Medicines:                Monitored Anesthesia Care Procedure:                Pre-Anesthesia Assessment:                           - Prior to the procedure, a History and Physical                            was performed, and patient medications and                            allergies were reviewed. The patient's tolerance of                            previous anesthesia was also reviewed. The risks                            and benefits of the procedure and the sedation                            options and risks were discussed with the patient.                            All questions were answered, and informed consent                            was obtained. Prior Anticoagulants: The patient has                            taken no anticoagulant or antiplatelet agents. ASA                            Grade Assessment: II - A patient with mild systemic                            disease. After reviewing the risks and benefits,                            the patient was deemed in satisfactory condition to                            undergo the procedure.                           After obtaining informed consent, the endoscope was  passed under direct vision. Throughout the                            procedure, the patient's blood pressure, pulse, and                            oxygen saturations were monitored continuously. The                            Olympus Scope [id number] was introduced through the                            mouth, and advanced to the second part of duodenum.                             The upper GI endoscopy was accomplished without                            difficulty. The patient tolerated the procedure                            well. Scope In: Scope Out: Findings:                 The examined esophagus was moderately tortuous. The                            scope was withdrawn. Dilation was performed with a                            Maloney dilator with no resistance at 54 Fr. The                            dilation site was examined following endoscope                            reinsertion and showed no change. Estimated blood                            loss: none.                           The exam was otherwise without abnormality.                           The cardia and gastric fundus were normal on                            retroflexion. Complications:            No immediate complications. Estimated Blood Loss:     Estimated blood loss: none. Impression:               - Tortuous esophagus. Dilated.                           -  The examination was otherwise normal (z-line                            slightly irregular which is normal variant)                           - No specimens collected. Recommendation:           - Patient has a contact number available for                            emergencies. The signs and symptoms of potential                            delayed complications were discussed with the                            patient. Return to normal activities tomorrow.                            Written discharge instructions were provided to the                            patient.                           - Clear liquids x 1 hour then soft foods rest of                            day. Start prior diet tomorrow.                           - Continue present medications.                           - I have asked him to give it 1 month - if still                            symptomatic then would consider additional testing                             - possible CT abd/pelvis, ba swallow. He has                            dyspepsia and also c/o food moving down slow +                            upper abd/chest discomfort. Coud be a medication                            side effect though I am not sure which one at this                            point. Iva Boop, MD 03/12/2023 9:04:04 AM  This report has been signed electronically.

## 2023-03-12 NOTE — Progress Notes (Signed)
Vitals-CW ?

## 2023-03-12 NOTE — Progress Notes (Signed)
Report given to PACU, vss 

## 2023-03-12 NOTE — Progress Notes (Signed)
[id number] Robinul 0.1 mg IV given due large amount of secretions upon assessment.  MD made aware, vss

## 2023-03-12 NOTE — Patient Instructions (Addendum)
I did not see any obvious problem. The esophagus was a little tortuous (twists and turns instead of straight) and that can happen as we get older and be a sign of abnormal squeezing and relaxing. So you might have esophageal dysmotility. I did stretch it with a dilator to see if that helps.  Please see what this does - if still having issues after 1 month let me know.  I did not see signs of ulcers, cancer, or anything bad.  I appreciate the opportunity to care for you. Iva Boop, MD, [facility]  Post-dilation diet (see handout).  Continue present medications. I have asked him to give it 1 month- If still symptomatic then would consider additional testing -possible CT abdomen/pelvis. He has dyspepsia and also complains of food moving down slow and upper abdomen/chest discomfort. Could be a medication side effect though I am not sure which one at this point.   YOU HAD AN ENDOSCOPIC PROCEDURE TODAY AT THE [facility]:   Refer to the procedure report that was given to you for any specific questions about what was found during the examination.  If the procedure report does not answer your questions, please call your gastroenterologist to clarify.  If you requested that your care partner not be given the details of your procedure findings, then the procedure report has been included in a sealed envelope for you to review at your convenience later.  YOU SHOULD EXPECT: Some feelings of bloating in the abdomen. Passage of more gas than usual.  Walking can help get rid of the air that was put into your GI tract during the procedure and reduce the bloating. If you had a lower endoscopy (such as a colonoscopy or flexible sigmoidoscopy) you may notice spotting of blood in your stool or on the toilet paper. If you underwent a bowel prep for your procedure, you may not have a normal bowel movement for a few days.  Please Note:  You might notice some irritation and congestion in your nose or some  drainage.  This is from the oxygen used during your procedure.  There is no need for concern and it should clear up in a day or so.  SYMPTOMS TO REPORT IMMEDIATELY:  Following upper endoscopy (EGD)  Vomiting of blood or coffee ground material  New chest pain or pain under the shoulder blades  Painful or persistently difficult swallowing  New shortness of breath  Fever of 100F or higher  Black, tarry-looking stools  For urgent or emergent issues, a gastroenterologist can be reached at any hour by calling [phone]. Do not use MyChart messaging for urgent concerns.    DIET:  Post-dilation diet: Clear liquids for 1 hour (until 1000am). Then soft foods (see handout) for the rest of today. Start prior diet tomorrow. Drink plenty of fluids but you should avoid alcoholic beverages for 24 hours.  ACTIVITY:  You should plan to take it easy for the rest of today and you should NOT DRIVE or use heavy machinery until tomorrow (because of the sedation medicines used during the test).    FOLLOW UP: Our staff will call the number listed on your records the next business day following your procedure.  We will call around 7:15- 8:00 am to check on you and address any questions or concerns that you may have regarding the information given to you following your procedure. If we do not reach you, we will leave a message.     If any biopsies  were taken you will be contacted by phone or by letter within the next 1-3 weeks.  Please call us at [phone] if you have not heard about the biopsies in 3 weeks.    SIGNATURES/CONFIDENTIALITY: You and/or your care partner have signed paperwork which will be entered into your electronic medical record.  These signatures attest to the fact that that the information above on your After Visit Summary has been reviewed and is understood.  Full responsibility of the confidentiality of this discharge information lies with you and/or your care-partner.

## 2023-03-12 NOTE — Progress Notes (Signed)
Called to room to assist during endoscopic procedure.  Patient ID and intended procedure confirmed with present staff. Received instructions for my participation in the procedure from the performing physician.  

## 2023-03-13 ENCOUNTER — Telehealth: Payer: Self-pay | Admitting: *Deleted

## 2023-03-13 LAB — HM DIABETES EYE EXAM

## 2023-03-13 NOTE — Telephone Encounter (Signed)
  Follow up Call-     03/12/2023    7:48 AM  Call back number  Post procedure Call Back phone  # [phone]  Permission to leave phone message Yes     Patient questions:  Do you have a fever, pain , or abdominal swelling? No. Pain Score  0 *  Have you tolerated food without any problems? Yes.    Have you been able to return to your normal activities? Yes.    Do you have any questions about your discharge instructions: Diet   No. Medications  No. Follow up visit  No.  Do you have questions or concerns about your Care? No.  Actions: * If pain score is 4 or above: No action needed, pain <4.

## 2023-03-19 ENCOUNTER — Ambulatory Visit: Payer: Medicare Other | Attending: Cardiology | Admitting: Cardiology

## 2023-03-19 ENCOUNTER — Other Ambulatory Visit (HOSPITAL_COMMUNITY): Payer: Self-pay

## 2023-03-19 ENCOUNTER — Encounter: Payer: Self-pay | Admitting: Cardiology

## 2023-03-19 VITALS — BP 108/68 | HR 77 | Ht 70.0 in | Wt 258.0 lb

## 2023-03-19 DIAGNOSIS — I5042 Chronic combined systolic (congestive) and diastolic (congestive) heart failure: Secondary | ICD-10-CM | POA: Diagnosis not present

## 2023-03-19 DIAGNOSIS — I10 Essential (primary) hypertension: Secondary | ICD-10-CM | POA: Diagnosis not present

## 2023-03-19 MED ORDER — SPIRONOLACTONE 25 MG PO TABS
25.0000 mg | ORAL_TABLET | Freq: Two times a day (BID) | ORAL | 3 refills | Status: DC
  Filled 2023-03-19 – 2023-05-26 (×2): qty 180, 90d supply, fill #0
  Filled 2023-08-20: qty 180, 90d supply, fill #1
  Filled 2023-11-18: qty 180, 90d supply, fill #2
  Filled 2024-02-12: qty 180, 90d supply, fill #3

## 2023-03-19 MED ORDER — ATORVASTATIN CALCIUM 10 MG PO TABS
10.0000 mg | ORAL_TABLET | Freq: Every day | ORAL | 3 refills | Status: DC
  Filled 2023-03-19 – 2023-05-26 (×2): qty 90, 90d supply, fill #0
  Filled 2023-08-20: qty 90, 90d supply, fill #1
  Filled 2023-11-18: qty 90, 90d supply, fill #2
  Filled 2024-02-12: qty 90, 90d supply, fill #3

## 2023-03-19 MED ORDER — AMLODIPINE BESYLATE 10 MG PO TABS
10.0000 mg | ORAL_TABLET | Freq: Every day | ORAL | 3 refills | Status: DC
  Filled 2023-03-19 – 2023-05-26 (×2): qty 90, 90d supply, fill #0
  Filled 2023-08-20: qty 90, 90d supply, fill #1
  Filled 2023-11-18: qty 90, 90d supply, fill #2
  Filled 2024-02-12: qty 90, 90d supply, fill #3

## 2023-03-19 MED ORDER — CARVEDILOL 25 MG PO TABS
25.0000 mg | ORAL_TABLET | Freq: Two times a day (BID) | ORAL | 3 refills | Status: DC
  Filled 2023-03-19 – 2023-04-16 (×2): qty 180, 90d supply, fill #0
  Filled 2023-07-14: qty 180, 90d supply, fill #1
  Filled 2023-10-08: qty 180, 90d supply, fill #2
  Filled 2024-01-06: qty 180, 90d supply, fill #3

## 2023-03-19 MED ORDER — ENTRESTO 49-51 MG PO TABS
1.0000 | ORAL_TABLET | Freq: Two times a day (BID) | ORAL | 11 refills | Status: DC
Start: 2023-03-19 — End: 2023-05-13
  Filled 2023-03-19 – 2023-04-16 (×2): qty 60, 30d supply, fill #0
  Filled 2023-05-02: qty 60, 30d supply, fill #1

## 2023-03-19 NOTE — Progress Notes (Signed)
  Cardiology Office Note:  .   Date:  03/19/2023  ID:  Pryor Montes, DOB 05/15/1951, MRN [id number] PCP: Margaree Mackintosh, MD  [facility] Providers Cardiologist:  Donato Schultz, MD    History of Present Illness: .   CHIDOZIE BREGENZER is a 72 y.o. male former Dr. 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 He reports feeling generally well, but express concern about occasional dizziness upon standing, which he attribute to their current high dose of Entresto. The patient's blood pressure readings have been consistently around 105/60, which he monitor daily. The patient also mentions a history of carpal tunnel syndrome.       ROS: No CP, no syncope  Studies Reviewed: [name]        LABS LDL: 55 (11/11/2022) Creatinine: 1.0 (05/2022) Hemoglobin: 13  DIAGNOSTIC Ejection Fraction: 60% (10/2019) Echocardiogram: Normal Risk Assessment/Calculations:            Physical Exam:   VS:  BP 108/68   Pulse 77   Ht 5\' 10"  (1.778 m)   Wt 258 lb (117 kg)   SpO2 98%   BMI 37.02 kg/m    Wt Readings from Last 3 Encounters:  03/19/23 258 lb (117 kg)  03/12/23 261 lb (118.4 kg)  03/06/23 261 lb 6 oz (118.6 kg)    GEN: Well nourished, well developed in no acute distress NECK: No JVD; No carotid bruits CARDIAC: RRR, no murmurs, rubs, gallops RESPIRATORY:  Clear to auscultation without rales, wheezing or rhonchi  ABDOMEN: Soft, non-tender, non-distended EXTREMITIES:  No edema; No deformity   ASSESSMENT AND PLAN: .    Assessment and Plan    Chronic Systolic Heart Failure Previously reduced ejection fraction (EF 35% in June 2019) improved to  normal (EF 60% in April 2021) on optimal medical therapy. Currently asymptomatic but experiencing occasional dizziness potentially related to low blood pressure on high dose Entresto. (Was 100/50's) -Reduce Entresto to 49-51mg  twice daily from current high dose. -Monitor blood pressure and symptoms. If blood pressure increases significantly or symptoms change, patient to contact office.  Nonobstructive Coronary Artery Disease Stable on Atorvastatin 10mg  daily with LDL at target (55 on 11/11/22). -Continue Atorvastatin 10mg  daily.  Hypertension Well controlled on current regimen including Carvedilol 25mg  twice daily, Amlodipine 10mg  daily, and Furosemide 40mg  daily. -Continue current antihypertensive regimen.  General Health Maintenance -Continue regular exercise (30 minutes daily). -Continue regular follow-up and blood work with primary care provider and cardiology.            Dispo: 1 yr  Signed, Donato Schultz, MD

## 2023-03-19 NOTE — Patient Instructions (Signed)
Medication Instructions:  Your physician has recommended you make the following change in your medication:   1) DECREASE Entresto to 49-51 mg twice daily  *If you need a refill on your cardiac medications before your next appointment, please call your pharmacy*  Lab Work: None ordered today.  Testing/Procedures: None ordered today.  Follow-Up: At [facility], you and your health needs are our priority.  As part of our continuing mission to provide you with exceptional heart care, we have created designated Provider Care Teams.  These Care Teams include your primary Cardiologist (physician) and Advanced Practice Providers (APPs -  Physician Assistants and Nurse Practitioners) who all work together to provide you with the care you need, when you need it.  Your next appointment:   12 month(s)  The format for your next appointment:   In Person  Provider:   Donato Schultz, MD

## 2023-03-31 ENCOUNTER — Other Ambulatory Visit (HOSPITAL_COMMUNITY): Payer: Self-pay

## 2023-04-09 ENCOUNTER — Other Ambulatory Visit: Payer: Self-pay

## 2023-04-09 ENCOUNTER — Telehealth: Payer: Self-pay | Admitting: Internal Medicine

## 2023-04-09 DIAGNOSIS — R109 Unspecified abdominal pain: Secondary | ICD-10-CM

## 2023-04-09 DIAGNOSIS — R1013 Epigastric pain: Secondary | ICD-10-CM

## 2023-04-09 DIAGNOSIS — R1319 Other dysphagia: Secondary | ICD-10-CM

## 2023-04-09 DIAGNOSIS — R634 Abnormal weight loss: Secondary | ICD-10-CM

## 2023-04-09 NOTE — Telephone Encounter (Signed)
Pt made aware of Dr. Leone Payor recommendations.  Pt was in agreement. Pt was notified that the he had been ordered and scheduled a CT scan on 04/17/2023 at 2:00 PM and to arrive at [facility] at 1:30 PM. Nothing  but clear liquids 4 hours prior. Pt made aware. Pt notified of the labs that need to be collected several days prior to the CT scan. Orders for labs have been placed. Pt made aware. Location to lab provided. Pt verbalized understanding with all questions answered.

## 2023-04-09 NOTE — Telephone Encounter (Signed)
Call patient and explain that I was checking his chart and thinking about his situation  It looks like he is still losing weight  I recommend he schedule a CT of the abdomen and pelvis with contrast  Please schedule if he is willing (will need BUN/creat)

## 2023-04-11 ENCOUNTER — Encounter: Payer: Self-pay | Admitting: Internal Medicine

## 2023-04-15 ENCOUNTER — Other Ambulatory Visit (INDEPENDENT_AMBULATORY_CARE_PROVIDER_SITE_OTHER): Payer: Medicare Other

## 2023-04-15 DIAGNOSIS — R634 Abnormal weight loss: Secondary | ICD-10-CM | POA: Diagnosis not present

## 2023-04-15 DIAGNOSIS — R1013 Epigastric pain: Secondary | ICD-10-CM | POA: Diagnosis not present

## 2023-04-15 DIAGNOSIS — R1319 Other dysphagia: Secondary | ICD-10-CM

## 2023-04-15 LAB — CREATININE, SERUM: Creatinine, Ser: 1.17 mg/dL (ref 0.40–1.50)

## 2023-04-15 LAB — BUN: BUN: 10 mg/dL (ref 6–23)

## 2023-04-16 ENCOUNTER — Other Ambulatory Visit: Payer: Self-pay

## 2023-04-16 ENCOUNTER — Other Ambulatory Visit: Payer: Self-pay | Admitting: Physician Assistant

## 2023-04-16 ENCOUNTER — Other Ambulatory Visit (HOSPITAL_COMMUNITY): Payer: Self-pay

## 2023-04-16 MED ORDER — FAMOTIDINE 40 MG PO TABS
40.0000 mg | ORAL_TABLET | Freq: Every day | ORAL | 2 refills | Status: DC
  Filled 2023-04-16: qty 30, 30d supply, fill #0
  Filled 2023-05-16: qty 60, 60d supply, fill #1

## 2023-04-17 ENCOUNTER — Ambulatory Visit (HOSPITAL_COMMUNITY)
Admission: RE | Admit: 2023-04-17 | Discharge: 2023-04-17 | Disposition: A | Discharge status: Home / Self Care | Payer: Medicare Other | Source: Ambulatory Visit | Attending: Internal Medicine | Admitting: Internal Medicine

## 2023-04-17 ENCOUNTER — Other Ambulatory Visit: Payer: Self-pay

## 2023-04-17 ENCOUNTER — Other Ambulatory Visit (HOSPITAL_COMMUNITY): Payer: Self-pay

## 2023-04-17 DIAGNOSIS — R634 Abnormal weight loss: Secondary | ICD-10-CM

## 2023-04-17 DIAGNOSIS — R1013 Epigastric pain: Secondary | ICD-10-CM | POA: Diagnosis not present

## 2023-04-17 DIAGNOSIS — R109 Unspecified abdominal pain: Secondary | ICD-10-CM | POA: Diagnosis not present

## 2023-04-17 DIAGNOSIS — N281 Cyst of kidney, acquired: Secondary | ICD-10-CM | POA: Diagnosis not present

## 2023-04-17 DIAGNOSIS — K429 Umbilical hernia without obstruction or gangrene: Secondary | ICD-10-CM | POA: Diagnosis not present

## 2023-04-17 DIAGNOSIS — K449 Diaphragmatic hernia without obstruction or gangrene: Secondary | ICD-10-CM | POA: Diagnosis not present

## 2023-04-17 DIAGNOSIS — N200 Calculus of kidney: Secondary | ICD-10-CM | POA: Diagnosis not present

## 2023-04-17 MED ORDER — IOHEXOL 300 MG/ML  SOLN
100.0000 mL | Freq: Once | INTRAMUSCULAR | Status: AC | PRN
  Administered 2023-04-17: 100 mL via INTRAVENOUS

## 2023-04-17 MED ORDER — SODIUM CHLORIDE (PF) 0.9 % IJ SOLN
INTRAMUSCULAR | Status: AC
  Filled 2023-04-17: qty 50

## 2023-04-22 ENCOUNTER — Other Ambulatory Visit: Payer: Self-pay

## 2023-04-30 ENCOUNTER — Other Ambulatory Visit: Payer: Self-pay

## 2023-04-30 ENCOUNTER — Other Ambulatory Visit (HOSPITAL_COMMUNITY): Payer: Self-pay

## 2023-04-30 ENCOUNTER — Other Ambulatory Visit: Payer: Self-pay | Admitting: Physician Assistant

## 2023-04-30 ENCOUNTER — Other Ambulatory Visit: Payer: Self-pay | Admitting: Internal Medicine

## 2023-05-01 ENCOUNTER — Other Ambulatory Visit (HOSPITAL_COMMUNITY): Payer: Self-pay

## 2023-05-01 ENCOUNTER — Other Ambulatory Visit: Payer: Self-pay

## 2023-05-01 ENCOUNTER — Other Ambulatory Visit: Payer: Self-pay | Admitting: Internal Medicine

## 2023-05-01 MED ORDER — PANTOPRAZOLE SODIUM 40 MG PO TBEC
40.0000 mg | DELAYED_RELEASE_TABLET | Freq: Two times a day (BID) | ORAL | 2 refills | Status: DC
  Filled 2023-05-01: qty 180, 90d supply, fill #0
  Filled 2023-08-04: qty 180, 90d supply, fill #1
  Filled 2023-10-29: qty 180, 90d supply, fill #2

## 2023-05-01 MED ORDER — ZOLPIDEM TARTRATE 5 MG PO TABS
5.0000 mg | ORAL_TABLET | Freq: Every evening | ORAL | 1 refills | Status: DC | PRN
  Filled 2023-05-01: qty 90, 90d supply, fill #0
  Filled 2023-08-04: qty 90, 90d supply, fill #1

## 2023-05-01 MED ORDER — DICYCLOMINE HCL 20 MG PO TABS
20.0000 mg | ORAL_TABLET | Freq: Two times a day (BID) | ORAL | 1 refills | Status: DC
  Filled 2023-05-01: qty 180, 90d supply, fill #0
  Filled 2023-08-04: qty 180, 90d supply, fill #1

## 2023-05-02 ENCOUNTER — Other Ambulatory Visit: Payer: Self-pay

## 2023-05-05 ENCOUNTER — Other Ambulatory Visit: Payer: Self-pay

## 2023-05-09 ENCOUNTER — Other Ambulatory Visit: Payer: Self-pay

## 2023-05-10 ENCOUNTER — Other Ambulatory Visit (HOSPITAL_COMMUNITY): Payer: Self-pay

## 2023-05-12 ENCOUNTER — Telehealth: Payer: Self-pay | Admitting: Cardiology

## 2023-05-12 NOTE — Telephone Encounter (Signed)
Spoke with pt who is concerned blood pressures have been elevated recently. In the range of 130-140/80s.  He feels uncomfortable with this. He is requesting his Entrestor dose be increased back to 97-103.  Advised I will review with Dr Anne Fu and call him back once approved.  Order will be sent into [facility]- to be delivered.

## 2023-05-12 NOTE — Telephone Encounter (Signed)
Pt c/o medication issue:  1. Name of Medication:   sacubitril-valsartan (ENTRESTO) 49-51 MG    2. How are you currently taking this medication (dosage and times per day)? Take 1 tablet by mouth 2 (two) times daily.   3. Are you having a reaction (difficulty breathing--STAT)? No   4. What is your medication issue? Pt states the medication has been causing his BP to go up. Pt states he wants to change it back to the 97-103. Please advise

## 2023-05-13 ENCOUNTER — Other Ambulatory Visit (HOSPITAL_COMMUNITY): Payer: Self-pay

## 2023-05-13 MED ORDER — SACUBITRIL-VALSARTAN 97-103 MG PO TABS
1.0000 | ORAL_TABLET | Freq: Two times a day (BID) | ORAL | 3 refills | Status: DC
  Filled 2023-05-13: qty 180, 90d supply, fill #0
  Filled 2023-05-13: qty 60, 30d supply, fill #0
  Filled 2023-06-16: qty 20, 10d supply, fill #1
  Filled 2023-07-10: qty 200, 100d supply, fill #2
  Filled 2023-10-08: qty 200, 100d supply, fill #3
  Filled 2024-01-06: qty 200, 100d supply, fill #4

## 2023-05-13 NOTE — Telephone Encounter (Signed)
Information reviewed with Dr Anne Fu who advised to increase Entresto to 97-103 mg.  Pt is aware and RX sent into pharmacy of choice.

## 2023-05-13 NOTE — Telephone Encounter (Signed)
Patient calling back to follow up.

## 2023-05-15 NOTE — Progress Notes (Signed)
Annual Wellness Visit    Patient Care Team: Baxley, Luanna Cole, MD as PCP - General (Internal Medicine) Jake Bathe, MD as PCP - Cardiology (Cardiology)  Visit Date: 05/22/23   Chief Complaint  Patient presents with   Medicare Wellness   Annual exam  Subjective:   Patient: [name] [name], Male    DOB: [birth year], 72 y.o.   MRN: [id number]  [name] [name] is a 72 y.o. Male who presents today for his Annual Wellness Visit. History of anxiety, arthritis, Type 2 diabetes mellitus, degenerative joint disease left knee, adenomatous colonic polyps, hypertension.  He is staying active by volunteering at his church.  Denies ear problems, feet swelling.  History of Type 2 diabetes mellitus treated with metformin 500 mg daily. HGBA1c at 5.9% on 05/19/23, down from 6.4% on 11/11/22.  History of hyperlipidemia treated with atorvastatin 10 mg daily.  History of anxiety, insomnia treated with alprazolam 0.25 - 0.5 mg morning and afternoon and 0.5 mg at bedtime, zolpidem 5 mg at bedtime as needed.  History of hypertension treated with amlodipine 10 mg daily, carvedilol 25 mg twice daily, furosemide 40 mg daily. History of combined systolic and diastolic heart failure, PVCs, abdominal aortic atherosclerosis treated with sacubitril-valsartan 97-103 mg twice daily, Aldactone. Followed by cardiologist, Dr. Donato Schultz. Ejection fraction on echo in April 2021 was 55 to 60%.  In 2020 ejection fraction was 30 to 35%.  History of GERD, IBS treated with famotidine 40 mg, pantoprazole 40 mg twice daily, dicyclomine 20 mg twice daily. He is passing a significant amount of gas. He is somewhat constipated, with BM's every 2-3 days. Believes he is eating enough fiber sources like leafy greens.  Followed by Dr. Garnette Scheuermann.  Was seen in the emergency department May 2023 with chest pain.  MI was ruled out.  Dr. Katrinka Blazing saw him in March 2023 and felt patient had nonobstructive/minimal atherosclerosis of the  coronary arteries, left ventricular dysfunction, obesity.     Hospitalized with acute cholecystitis July 2017.  He had ERCP and laparoscopic cholecystectomy.  05/19/23 labs reviewed today. Glucose elevated at 113. Kidney, liver functions normal. Electrolytes normal. Blood proteins normal. RBC low at 3.89 million. Hemoglobin low at 12.4. Lipid panel normal. PSA at 0.58.  Colonoscopy was done in 2020/07/01 by Dr. Leone Payor and showed small adenomatous polyps with recall recommended in 07-02-2027.   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 Wife is retired from the school system. He does not smoke or consume alcohol. 1 son with history of kidney transplant due to hypertension and chronic kidney disease. They have 3 adult children, 2 sons and a daughter.   Family history: Family history of hypertension in mother. Mother also had history of stroke. She died at age 72 in 07-01-02 of a CVA.   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  HENT:  Negative for hearing loss, sinus pain and sore throat.   Respiratory:  Negative for cough, hemoptysis and shortness of breath.   Cardiovascular:  Negative for chest pain, palpitations, leg swelling  and PND.  Gastrointestinal:  Positive for abdominal pain (Bloating) and constipation. Negative for diarrhea, heartburn, nausea and vomiting.  Genitourinary:  Negative for dysuria, frequency and urgency.  Musculoskeletal:  Negative for back pain, myalgias and neck pain.  Skin:  Negative for itching and rash.  Neurological:  Negative for dizziness, tingling, seizures and headaches.  Endo/Heme/Allergies:  Negative for polydipsia.  Psychiatric/Behavioral:  Negative for depression. The patient is not nervous/anxious.       Objective:   Vitals: BP 100/70   Pulse 72   Ht 5\' 10"  (1.778 m)   Wt 252 lb (114.3 kg)   SpO2 98%   BMI 36.16 kg/m   Physical Exam Vitals and nursing note reviewed.  Constitutional:      General: He is awake. He is not in acute distress.    Appearance: Normal appearance. He is not ill-appearing or toxic-appearing.  HENT:     Head: Normocephalic and atraumatic.     Right Ear: Hearing, tympanic membrane, ear canal and external ear normal.     Left Ear: Hearing, tympanic membrane, ear canal and external ear normal.     Ears:     Comments: Some cerumen right external ear canal.    Mouth/Throat:     Pharynx: Oropharynx is clear.  Eyes:     Extraocular Movements: Extraocular movements intact.     Pupils: Pupils are equal, round, and reactive to light.  Neck:     Thyroid: No thyroid mass, thyromegaly or thyroid tenderness.     Vascular: No carotid bruit.  Cardiovascular:     Rate and Rhythm: Normal rate and regular rhythm. No extrasystoles are present.    Pulses:          Dorsalis pedis pulses are 2+ on the right side and 2+ on the left side.     Heart sounds: Normal heart sounds. No murmur heard.    No friction rub. No gallop.  Pulmonary:     Effort: Pulmonary effort is normal.     Breath sounds: Normal breath sounds. No decreased breath sounds, wheezing, rhonchi or rales.  Chest:     Chest wall: No mass.  Abdominal:     Palpations: Abdomen is soft.      Tenderness: There is no abdominal tenderness.     Hernia: No hernia is present.  Genitourinary:    Prostate: No nodules present.     Comments: Prostate slightly enlarged. Musculoskeletal:     Cervical back: Normal range of motion.     Right lower leg: No edema.     Left lower leg: No edema.  Lymphadenopathy:     Cervical: No cervical adenopathy.     Upper Body:     Right upper body: No supraclavicular adenopathy.     Left upper body: No supraclavicular adenopathy.  Skin:    General: Skin is warm and dry.  Neurological:     General:  No focal deficit present.     Mental Status: He is alert and oriented to person, place, and time. Mental status is at baseline.     Cranial Nerves: Cranial nerves 2-12 are intact.     Sensory: Sensation is intact.     Motor: Motor function is intact.     Coordination: Coordination is intact.     Gait: Gait is intact.     Deep Tendon Reflexes: Reflexes are normal and symmetric.  Psychiatric:        Attention and Perception: Attention normal.        Mood and Affect: Mood normal.        Speech: Speech normal.        Behavior: Behavior normal. Behavior is cooperative.        Thought Content: Thought content normal.        Cognition and Memory: Cognition and memory normal.        Judgment: Judgment normal.      Most recent functional status assessment:    05/22/2023   10:00 AM  In your present state of health, do you have any difficulty performing the following activities:  Hearing? 0  Vision? 0  Difficulty concentrating or making decisions? 0  Walking or climbing stairs? 0  Dressing or bathing? 0  Doing errands, shopping? 0  Preparing Food and eating ? N  Using the Toilet? N  In the past six months, have you accidently leaked urine? N  Do you have problems with loss of bowel control? N  Managing your Medications? N  Managing your Finances? N  Housekeeping or managing your Housekeeping? N   Most recent fall risk assessment:     05/22/2023   10:02 AM  Fall Risk   Falls in the past year? 0  Number falls in past yr: 0  Injury with Fall? 0  Risk for fall due to : No Fall Risks  Follow up Falls evaluation completed;Education provided;Falls prevention discussed    Most recent depression screenings:    05/22/2023   10:02 AM 11/12/2022   10:36 AM  PHQ 2/9 Scores  PHQ - 2 Score 0 0   Most recent cognitive screening:    05/22/2023   10:02 AM  6CIT Screen  What Year? 0 points  What month? 0 points  What time? 0 points  Count back from 20 0 points  Months in reverse 0 points  Repeat phrase 0 points  Total Score 0 points     Results:   Studies obtained and personally reviewed by me:  Colonoscopy was done in 2021 by Dr. Leone Payor and showed small adenomatous polyps with recall recommended in 2028.   Labs:       Component Value Date/Time   NA 140 05/19/2023 0923   NA 139 01/16/2021 0822   K 4.2 05/19/2023 0923   CL 102 05/19/2023 0923   CO2 29 05/19/2023 0923   GLUCOSE 113 (H) 05/19/2023 0923   BUN 14 05/19/2023 0923   BUN 12 01/16/2021 0822   CREATININE 1.12 05/19/2023 0923   CALCIUM 9.7 05/19/2023 0923   PROT 7.6 05/19/2023 0923   ALBUMIN 3.2 (L) 09/06/2020 1038   AST 12 05/19/2023 0923   ALT 9 05/19/2023 0923   ALKPHOS 48 09/06/2020 1038   BILITOT 1.2 05/19/2023 0923   GFRNONAA >60 12/03/2021 2008   GFRNONAA 73 04/21/2020 0904   GFRAA 85 04/21/2020 0904     Lab Results  Component Value Date   WBC 4.1  05/19/2023   HGB 12.4 (L) 05/19/2023   HCT 38.5 05/19/2023   MCV 99.0 05/19/2023   PLT 274 05/19/2023    Lab Results  Component Value Date   CHOL 129 05/19/2023   HDL 51 05/19/2023   LDLCALC 61 05/19/2023   TRIG 88 05/19/2023   CHOLHDL 2.5 05/19/2023    Lab Results  Component Value Date   HGBA1C 5.9 (H) 05/19/2023     Lab Results  Component Value Date   TSH 0.564 09/06/2020     Lab Results  Component Value Date   PSA 0.58 05/19/2023   PSA 0.48 05/13/2022   PSA 0.54  05/01/2021    Assessment & Plan:   Type 2 diabetes mellitus: treated with metformin 500 mg daily. HGBA1c at 5.9% on 05/19/23, down from 6.4% on 11/11/22.  Hyperlipidemia: treated with atorvastatin 10 mg daily. Lipid panel normal.  Anxiety, insomnia: stable with alprazolam 0.25 - 0.5 mg morning and afternoon and 0.5 mg at bedtime, zolpidem 5 mg at bedtime as needed.  Hypertension: treated with amlodipine 10 mg daily, carvedilol 25 mg twice daily, furosemide 40 mg daily.   Combined systolic and diastolic heart failure, PVCs, abdominal aortic atherosclerosis: treated with sacubitril-valsartan 97-103 mg twice daily, Aldactone. Followed by cardiologist, Dr. Donato Schultz. Ejection fraction on echo in April 2021 was 55 to 60%.  In 2020 ejection fraction was 30 to 35%. Takes statin.  GERD, IBS: stable with famotidine 40 mg, pantoprazole 40 mg twice daily, dicyclomine 20 mg twice daily.  Constipation: continue with high fiber diet and can take OTC Mirilax daily.  Anemia: Hemoglobin slightly  low at 12.4.grams and  was 13 grams last year. Hx of mild normocytic anemia. Highest Hgb on file 14.3 grams in 2020. Continue to monitor and recheck in 6 months.  Urinalysis normal today.  Colonoscopy was done in 2021 by Dr. Leone Payor and showed small adenomatous polyps with recall recommended in 2028.   Vaccine counseling: UTD on tetanus, shingles, pneumococcal-20 vaccines. Reports UTD on RSV, Covid-19 vaccines.  Return in 6 months for follow-up.Will need CBC diff c-met and lipid panel then     Annual wellness visit done today including the all of the following: Reviewed patient's Family Medical History Reviewed and updated list of patient's medical providers Assessment of cognitive impairment was done Assessed patient's functional ability Established a written schedule for health screening services Health Risk Assessent Completed and Reviewed  Discussed health benefits of physical activity, and  encouraged him to engage in regular exercise appropriate for his age and condition.        I,Alexander Ruley,acting as a Neurosurgeon for Margaree Mackintosh, MD.,have documented all relevant documentation on the behalf of Margaree Mackintosh, MD,as directed by  Margaree Mackintosh, MD while in the presence of Margaree Mackintosh, MD.   I, Margaree Mackintosh, MD, have reviewed all documentation for this visit. The documentation on 06/01/23 for the exam, diagnosis, procedures, and orders are all accurate and complete.

## 2023-05-16 ENCOUNTER — Other Ambulatory Visit: Payer: Self-pay

## 2023-05-19 ENCOUNTER — Other Ambulatory Visit: Payer: Medicare Other

## 2023-05-19 DIAGNOSIS — I5042 Chronic combined systolic (congestive) and diastolic (congestive) heart failure: Secondary | ICD-10-CM

## 2023-05-19 DIAGNOSIS — E1169 Type 2 diabetes mellitus with other specified complication: Secondary | ICD-10-CM

## 2023-05-19 DIAGNOSIS — Z125 Encounter for screening for malignant neoplasm of prostate: Secondary | ICD-10-CM

## 2023-05-19 DIAGNOSIS — E7800 Pure hypercholesterolemia, unspecified: Secondary | ICD-10-CM

## 2023-05-19 DIAGNOSIS — Z0000 Encounter for general adult medical examination without abnormal findings: Secondary | ICD-10-CM

## 2023-05-19 DIAGNOSIS — I10 Essential (primary) hypertension: Secondary | ICD-10-CM

## 2023-05-19 DIAGNOSIS — E118 Type 2 diabetes mellitus with unspecified complications: Secondary | ICD-10-CM | POA: Diagnosis not present

## 2023-05-20 LAB — LIPID PANEL
Cholesterol: 129 mg/dL (ref <200)
HDL: 51 mg/dL (ref >=40)
LDL Cholesterol (Calc): 61 mg/dL
Non-HDL Cholesterol (Calc): 78 mg/dL (ref <130)
Total CHOL/HDL Ratio: 2.5 (calc) (ref <5.0)
Triglycerides: 88 mg/dL (ref <150)

## 2023-05-20 LAB — COMPLETE METABOLIC PANEL WITH GFR
AG Ratio: 1.5 (calc) (ref 1.0–2.5)
ALT: 9 U/L (ref 9–46)
AST: 12 U/L (ref 10–35)
Albumin: 4.5 g/dL (ref 3.6–5.1)
Alkaline phosphatase (APISO): 61 U/L (ref 35–144)
BUN: 14 mg/dL (ref 7–25)
CO2: 29 mmol/L (ref 20–32)
Calcium: 9.7 mg/dL (ref 8.6–10.3)
Chloride: 102 mmol/L (ref 98–110)
Creat: 1.12 mg/dL (ref 0.70–1.28)
Globulin: 3.1 g/dL (ref 1.9–3.7)
Glucose, Bld: 113 mg/dL — ABNORMAL HIGH (ref 65–99)
Potassium: 4.2 mmol/L (ref 3.5–5.3)
Sodium: 140 mmol/L (ref 135–146)
Total Bilirubin: 1.2 mg/dL (ref 0.2–1.2)
Total Protein: 7.6 g/dL (ref 6.1–8.1)
eGFR: 71 mL/min/{1.73_m2} (ref >=60)

## 2023-05-20 LAB — CBC WITH DIFFERENTIAL/PLATELET
Absolute Lymphocytes: 1599 {cells}/uL (ref 850–3900)
Absolute Monocytes: 451 {cells}/uL (ref 200–950)
Basophils Absolute: 49 {cells}/uL (ref 0–200)
Basophils Relative: 1.2 %
Eosinophils Absolute: 41 {cells}/uL (ref 15–500)
Eosinophils Relative: 1 %
HCT: 38.5 % (ref 38.5–50.0)
Hemoglobin: 12.4 g/dL — ABNORMAL LOW (ref 13.2–17.1)
MCH: 31.9 pg (ref 27.0–33.0)
MCHC: 32.2 g/dL (ref 32.0–36.0)
MCV: 99 fL (ref 80.0–100.0)
MPV: 10 fL (ref 7.5–12.5)
Monocytes Relative: 11 %
Neutro Abs: 1960 {cells}/uL (ref 1500–7800)
Neutrophils Relative %: 47.8 %
Platelets: 274 10*3/uL (ref 140–400)
RBC: 3.89 10*6/uL — ABNORMAL LOW (ref 4.20–5.80)
RDW: 11.8 % (ref 11.0–15.0)
Total Lymphocyte: 39 %
WBC: 4.1 10*3/uL (ref 3.8–10.8)

## 2023-05-20 LAB — PSA: PSA: 0.58 ng/mL (ref <=4.00)

## 2023-05-20 LAB — HEMOGLOBIN A1C
Hgb A1c MFr Bld: 5.9 %{Hb} — ABNORMAL HIGH (ref <5.7)
Mean Plasma Glucose: 123 mg/dL
eAG (mmol/L): 6.8 mmol/L

## 2023-05-22 ENCOUNTER — Encounter: Payer: Self-pay | Admitting: Internal Medicine

## 2023-05-22 ENCOUNTER — Ambulatory Visit: Payer: Medicare Other | Admitting: Internal Medicine

## 2023-05-22 VITALS — BP 100/70 | HR 72 | Ht 70.0 in | Wt 252.0 lb

## 2023-05-22 DIAGNOSIS — E1169 Type 2 diabetes mellitus with other specified complication: Secondary | ICD-10-CM | POA: Diagnosis not present

## 2023-05-22 DIAGNOSIS — E118 Type 2 diabetes mellitus with unspecified complications: Secondary | ICD-10-CM

## 2023-05-22 DIAGNOSIS — K219 Gastro-esophageal reflux disease without esophagitis: Secondary | ICD-10-CM | POA: Diagnosis not present

## 2023-05-22 DIAGNOSIS — I10 Essential (primary) hypertension: Secondary | ICD-10-CM

## 2023-05-22 DIAGNOSIS — Z96653 Presence of artificial knee joint, bilateral: Secondary | ICD-10-CM

## 2023-05-22 DIAGNOSIS — D649 Anemia, unspecified: Secondary | ICD-10-CM

## 2023-05-22 DIAGNOSIS — K5900 Constipation, unspecified: Secondary | ICD-10-CM

## 2023-05-22 DIAGNOSIS — Z9889 Other specified postprocedural states: Secondary | ICD-10-CM

## 2023-05-22 DIAGNOSIS — I5042 Chronic combined systolic (congestive) and diastolic (congestive) heart failure: Secondary | ICD-10-CM

## 2023-05-22 DIAGNOSIS — Z0000 Encounter for general adult medical examination without abnormal findings: Secondary | ICD-10-CM | POA: Diagnosis not present

## 2023-05-22 DIAGNOSIS — E7800 Pure hypercholesterolemia, unspecified: Secondary | ICD-10-CM | POA: Diagnosis not present

## 2023-05-22 DIAGNOSIS — K582 Mixed irritable bowel syndrome: Secondary | ICD-10-CM | POA: Diagnosis not present

## 2023-05-22 DIAGNOSIS — E785 Hyperlipidemia, unspecified: Secondary | ICD-10-CM | POA: Diagnosis not present

## 2023-05-22 DIAGNOSIS — Z8659 Personal history of other mental and behavioral disorders: Secondary | ICD-10-CM

## 2023-05-22 DIAGNOSIS — Z860101 Personal history of adenomatous and serrated colon polyps: Secondary | ICD-10-CM

## 2023-05-22 DIAGNOSIS — F409 Phobic anxiety disorder, unspecified: Secondary | ICD-10-CM

## 2023-05-22 DIAGNOSIS — F5105 Insomnia due to other mental disorder: Secondary | ICD-10-CM

## 2023-05-22 DIAGNOSIS — Z6836 Body mass index (BMI) 36.0-36.9, adult: Secondary | ICD-10-CM

## 2023-05-22 LAB — POCT URINALYSIS DIP (CLINITEK)
Bilirubin, UA: NEGATIVE
Blood, UA: NEGATIVE
Glucose, UA: NEGATIVE mg/dL
Ketones, POC UA: NEGATIVE mg/dL
Leukocytes, UA: NEGATIVE
Nitrite, UA: NEGATIVE
POC PROTEIN,UA: NEGATIVE
Spec Grav, UA: 1.01 (ref 1.010–1.025)
Urobilinogen, UA: 0.2 U/dL
pH, UA: 6.5 (ref 5.0–8.0)

## 2023-05-26 ENCOUNTER — Other Ambulatory Visit (HOSPITAL_COMMUNITY): Payer: Self-pay

## 2023-05-26 ENCOUNTER — Other Ambulatory Visit: Payer: Self-pay

## 2023-05-26 ENCOUNTER — Other Ambulatory Visit: Payer: Self-pay | Admitting: Internal Medicine

## 2023-05-26 MED ORDER — METFORMIN HCL ER 500 MG PO TB24
500.0000 mg | ORAL_TABLET | Freq: Two times a day (BID) | ORAL | 3 refills | Status: DC
  Filled 2023-05-26: qty 180, 90d supply, fill #0
  Filled 2023-08-20: qty 180, 90d supply, fill #1
  Filled 2023-11-18: qty 180, 90d supply, fill #2
  Filled 2024-02-12: qty 180, 90d supply, fill #3

## 2023-06-01 ENCOUNTER — Encounter: Payer: Self-pay | Admitting: Internal Medicine

## 2023-06-01 LAB — HM DIABETES EYE EXAM

## 2023-06-01 NOTE — Patient Instructions (Signed)
It was a pleasure to see you today. Vaccines reviewed. Hgb slightly low at 12.4 grams. Please notify us if you are having rectal bleeding or black tarry stools. Colonoscopy is up to date. Continue follow up with Cardiology. Return in 6 months for 6 month recheck.

## 2023-06-03 ENCOUNTER — Encounter: Payer: Self-pay | Admitting: Internal Medicine

## 2023-06-16 ENCOUNTER — Other Ambulatory Visit: Payer: Self-pay | Admitting: Internal Medicine

## 2023-06-16 ENCOUNTER — Other Ambulatory Visit (HOSPITAL_COMMUNITY): Payer: Self-pay

## 2023-06-16 ENCOUNTER — Other Ambulatory Visit: Payer: Self-pay

## 2023-06-16 MED ORDER — ALPRAZOLAM 0.5 MG PO TABS
ORAL_TABLET | ORAL | 3 refills | Status: DC
  Filled 2023-06-16 – 2023-06-26 (×2): qty 90, 30d supply, fill #0
  Filled 2023-08-04: qty 90, 30d supply, fill #1
  Filled 2023-09-17: qty 90, 30d supply, fill #2
  Filled 2023-10-16: qty 90, 30d supply, fill #3

## 2023-06-23 ENCOUNTER — Other Ambulatory Visit (HOSPITAL_COMMUNITY): Payer: Self-pay

## 2023-06-23 DIAGNOSIS — M170 Bilateral primary osteoarthritis of knee: Secondary | ICD-10-CM | POA: Diagnosis not present

## 2023-06-23 DIAGNOSIS — M1711 Unilateral primary osteoarthritis, right knee: Secondary | ICD-10-CM | POA: Diagnosis not present

## 2023-06-23 DIAGNOSIS — M1712 Unilateral primary osteoarthritis, left knee: Secondary | ICD-10-CM | POA: Diagnosis not present

## 2023-06-23 MED ORDER — PREDNISONE 5 MG (21) PO TBPK
ORAL_TABLET | ORAL | 0 refills | Status: DC
  Filled 2023-06-23: qty 21, 6d supply, fill #0

## 2023-06-26 ENCOUNTER — Other Ambulatory Visit: Payer: Self-pay

## 2023-06-30 ENCOUNTER — Other Ambulatory Visit: Payer: Self-pay

## 2023-07-07 ENCOUNTER — Other Ambulatory Visit: Payer: Self-pay

## 2023-07-10 ENCOUNTER — Other Ambulatory Visit: Payer: Self-pay

## 2023-07-14 ENCOUNTER — Other Ambulatory Visit: Payer: Self-pay | Admitting: Physician Assistant

## 2023-07-14 ENCOUNTER — Other Ambulatory Visit: Payer: Self-pay

## 2023-07-14 DIAGNOSIS — M4726 Other spondylosis with radiculopathy, lumbar region: Secondary | ICD-10-CM | POA: Diagnosis not present

## 2023-07-14 DIAGNOSIS — M6281 Muscle weakness (generalized): Secondary | ICD-10-CM | POA: Diagnosis not present

## 2023-07-14 MED ORDER — FAMOTIDINE 40 MG PO TABS
40.0000 mg | ORAL_TABLET | Freq: Every day | ORAL | 2 refills | Status: DC
  Filled 2023-07-14: qty 90, 90d supply, fill #0

## 2023-07-16 ENCOUNTER — Encounter: Payer: Self-pay | Admitting: Internal Medicine

## 2023-07-16 ENCOUNTER — Ambulatory Visit: Payer: Medicare Other | Admitting: Internal Medicine

## 2023-07-16 VITALS — BP 122/76 | HR 83 | Ht 70.0 in | Wt 257.0 lb

## 2023-07-16 DIAGNOSIS — K219 Gastro-esophageal reflux disease without esophagitis: Secondary | ICD-10-CM | POA: Diagnosis not present

## 2023-07-16 DIAGNOSIS — R1013 Epigastric pain: Secondary | ICD-10-CM | POA: Diagnosis not present

## 2023-07-16 DIAGNOSIS — R634 Abnormal weight loss: Secondary | ICD-10-CM

## 2023-07-16 NOTE — Progress Notes (Signed)
 [name] [name] 73 y.o. 1951/04/20 [id number]  Assessment & Plan:   Encounter Diagnoses  Name Primary?   Dyspepsia Yes   Gastroesophageal reflux disease, unspecified whether esophagitis present    Unintentional weight loss stabilized    The patient is significantly improved.  He does have some postprandial gas symptoms in the evening at times.  He can try Gas-X versus Mylanta.  He asked about possibly changing PPI.  He was on generic Nexium  before we switch to pantoprazole .  Dexlansoprazole is on his formulary perhaps but it could be costly so we decided to this stay on his current regimen since he is so much improved.  He had come to our attention in August after he had had a COVID infection so I wonder if he did not have some sort of postviral contribution to his symptomatology over time.  He will continue his current medication regimen and return as needed.   Subjective:   Chief Complaint: Follow-up of epigastric pain  HPI 73 year old African-American man who had seen Delon Failing in August for epigastric pain he had some weight loss.  Dysphagia was an issue as well.  EEG ED on March 12, 2023 showed a tortuous esophagus that I dilated with a 68 French Maloney dilator, it was otherwise unremarkable.  I had him do a CT scan of the abdomen and pelvis that showed a right lower pole renal calculus but was otherwise negative.  He returns today reporting that he is really overall much better.  He does have some pressure in the lower chest after eating his evening meal at times relieved by belching and might be helped by carbonated beverage.  Sometimes Mylanta as been used.  There is no more dysphagia.  Wt Readings from Last 3 Encounters:  07/16/23 257 lb (116.6 kg)  05/22/23 252 lb (114.3 kg)  03/19/23 258 lb (117 kg)     No Known Allergies Current Meds  Medication Sig   ALPRAZolam  (XANAX ) 0.5 MG tablet Take 1/2-1 tablet (0.25-0.5 mg total) in the morning, 1/2-1 tablet  (0.25-0.5 mg total) in the early afternoon and 1 tablet (0.5 mg total) at bedtime  for anxiety   amLODipine  (NORVASC ) 10 MG tablet Take 1 tablet (10 mg total) by mouth daily.   aspirin  EC 81 MG tablet Take 1 tablet (81 mg total) by mouth 2 (two) times daily after a meal.   atorvastatin  (LIPITOR) 10 MG tablet Take 1 tablet (10 mg total) by mouth daily.   carvedilol  (COREG ) 25 MG tablet Take 1 tablet (25 mg total) by mouth 2 (two) times daily.   dicyclomine  (BENTYL ) 20 MG tablet Take 1 tablet (20 mg total) by mouth 2 (two) times daily.   famotidine  (PEPCID ) 40 MG tablet Take 1 tablet (40 mg total) by mouth at bedtime.   furosemide  (LASIX ) 40 MG tablet Take 1 tablet (40 mg total) by mouth daily.   meloxicam  (MOBIC ) 15 MG tablet Take 1 tablet (15 mg total) by mouth daily.   metFORMIN  (GLUCOPHAGE -XR) 500 MG 24 hr tablet Take 1 tablet (500 mg total) by mouth 2 (two) times daily.   pantoprazole  (PROTONIX ) 40 MG tablet Take 1 tablet (40 mg total) by mouth 2 (two) times daily before a meal.   sacubitril -valsartan  (ENTRESTO ) 97-103 MG Take 1 tablet by mouth 2 (two) times daily.   spironolactone  (ALDACTONE ) 25 MG tablet Take 1 tablet (25 mg total) by mouth 2 (two) times daily.   zolpidem  (AMBIEN ) 5 MG tablet Take 1 tablet (5  mg total) by mouth at bedtime as needed for sleep.   Past Medical History:  Diagnosis Date   Anxiety    Arthritis    Bradycardia    hx of  in 30's medications changed   Diabetes mellitus without complication (HCC)    DJD (degenerative joint disease) of knee    Left   Hx of adenomatous colonic polyps 12/2019   2 diminutive   Hypertension    Past Surgical History:  Procedure Laterality Date   CARDIAC CATHETERIZATION  07/23/2018   CARPAL TUNNEL RELEASE Bilateral 2023   CHOLECYSTECTOMY N/A 02/04/2016   Procedure: LAPAROSCOPIC CHOLECYSTECTOMY;  Surgeon: Bernarda Ned, MD;  Location: [facility];  Service: General;  Laterality: N/A;   COLONOSCOPY  12/13/2019   ERCP N/A 02/02/2016    Procedure: ENDOSCOPIC RETROGRADE CHOLANGIOPANCREATOGRAPHY (ERCP);  Surgeon: Lupita FORBES Commander, MD;  Location: [facility];  Service: Endoscopy;  Laterality: N/A;   HERNIA REPAIR     RIGHT/LEFT HEART CATH AND CORONARY ANGIOGRAPHY N/A 07/23/2018   Procedure: RIGHT/LEFT HEART CATH AND CORONARY ANGIOGRAPHY;  Surgeon: Claudene Victory ORN, MD;  Location: [facility];  Service: Cardiovascular;  Laterality: N/A;   SPINE SURGERY     TOTAL KNEE ARTHROPLASTY Left 07/28/2018   Procedure: TOTAL KNEE ARTHROPLASTY;  Surgeon: Sheril Coy, MD;  Location: [facility];  Service: Orthopedics;  Laterality: Left;   TOTAL KNEE ARTHROPLASTY Right 09/05/2020   Procedure: RIGHT TOTAL KNEE ARTHROPLASTY;  Surgeon: Sheril Coy, MD;  Location: [facility];  Service: Orthopedics;  Laterality: Right;   Social History   Social History Narrative   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   He is married   No alcohol tobacco or drug use      family history includes Hypertension in his mother; Stroke in his mother.   Review of Systems As per HPI  Objective:   Physical Exam BP 122/76   Pulse 83   Ht 5' 10 (1.778 m)   Wt 257 lb (116.6 kg)   BMI 36.88 kg/m    22 minutes total time on this visit for during and after patient position interaction.

## 2023-07-16 NOTE — Patient Instructions (Signed)
 Glad things are better.  You can try taking Gas Ex after your evening meal or use Mylanta as needed, also.   I appreciate the opportunity to care for you. Iva Boop, MD, Clementeen Graham

## 2023-07-18 DIAGNOSIS — M4726 Other spondylosis with radiculopathy, lumbar region: Secondary | ICD-10-CM | POA: Diagnosis not present

## 2023-07-18 DIAGNOSIS — M6281 Muscle weakness (generalized): Secondary | ICD-10-CM | POA: Diagnosis not present

## 2023-07-22 DIAGNOSIS — M6281 Muscle weakness (generalized): Secondary | ICD-10-CM | POA: Diagnosis not present

## 2023-07-22 DIAGNOSIS — M4726 Other spondylosis with radiculopathy, lumbar region: Secondary | ICD-10-CM | POA: Diagnosis not present

## 2023-07-25 ENCOUNTER — Other Ambulatory Visit (HOSPITAL_COMMUNITY): Payer: Self-pay

## 2023-07-25 DIAGNOSIS — M545 Low back pain, unspecified: Secondary | ICD-10-CM | POA: Diagnosis not present

## 2023-07-25 DIAGNOSIS — M4726 Other spondylosis with radiculopathy, lumbar region: Secondary | ICD-10-CM | POA: Diagnosis not present

## 2023-07-25 DIAGNOSIS — M6281 Muscle weakness (generalized): Secondary | ICD-10-CM | POA: Diagnosis not present

## 2023-07-25 MED ORDER — PREDNISONE 5 MG (21) PO TBPK
ORAL_TABLET | ORAL | 0 refills | Status: DC
  Filled 2023-07-25: qty 21, 6d supply, fill #0

## 2023-07-29 DIAGNOSIS — M6281 Muscle weakness (generalized): Secondary | ICD-10-CM | POA: Diagnosis not present

## 2023-07-29 DIAGNOSIS — M4726 Other spondylosis with radiculopathy, lumbar region: Secondary | ICD-10-CM | POA: Diagnosis not present

## 2023-07-31 DIAGNOSIS — M4726 Other spondylosis with radiculopathy, lumbar region: Secondary | ICD-10-CM | POA: Diagnosis not present

## 2023-07-31 DIAGNOSIS — M6281 Muscle weakness (generalized): Secondary | ICD-10-CM | POA: Diagnosis not present

## 2023-08-04 ENCOUNTER — Other Ambulatory Visit: Payer: Self-pay

## 2023-08-04 DIAGNOSIS — M4726 Other spondylosis with radiculopathy, lumbar region: Secondary | ICD-10-CM | POA: Diagnosis not present

## 2023-08-04 DIAGNOSIS — M6281 Muscle weakness (generalized): Secondary | ICD-10-CM | POA: Diagnosis not present

## 2023-08-06 DIAGNOSIS — M4726 Other spondylosis with radiculopathy, lumbar region: Secondary | ICD-10-CM | POA: Diagnosis not present

## 2023-08-06 DIAGNOSIS — M6281 Muscle weakness (generalized): Secondary | ICD-10-CM | POA: Diagnosis not present

## 2023-08-11 ENCOUNTER — Other Ambulatory Visit (HOSPITAL_COMMUNITY): Payer: Self-pay

## 2023-08-11 DIAGNOSIS — M545 Low back pain, unspecified: Secondary | ICD-10-CM | POA: Diagnosis not present

## 2023-08-11 MED ORDER — DIAZEPAM 2 MG PO TABS
2.0000 mg | ORAL_TABLET | Freq: Once | ORAL | 0 refills | Status: DC | PRN
  Filled 2023-08-11: qty 2, 1d supply, fill #0

## 2023-08-14 ENCOUNTER — Other Ambulatory Visit: Payer: Self-pay | Admitting: Orthopaedic Surgery

## 2023-08-14 DIAGNOSIS — M545 Low back pain, unspecified: Secondary | ICD-10-CM

## 2023-08-20 ENCOUNTER — Other Ambulatory Visit (HOSPITAL_COMMUNITY): Payer: Self-pay

## 2023-08-20 ENCOUNTER — Other Ambulatory Visit: Payer: Self-pay | Admitting: Internal Medicine

## 2023-08-20 ENCOUNTER — Other Ambulatory Visit: Payer: Self-pay

## 2023-08-20 MED ORDER — MELOXICAM 15 MG PO TABS
15.0000 mg | ORAL_TABLET | Freq: Every day | ORAL | 1 refills | Status: DC
  Filled 2023-08-20: qty 90, 90d supply, fill #0
  Filled 2023-11-18: qty 90, 90d supply, fill #1

## 2023-08-21 ENCOUNTER — Other Ambulatory Visit: Payer: Self-pay

## 2023-08-28 ENCOUNTER — Other Ambulatory Visit: Payer: Self-pay

## 2023-09-01 ENCOUNTER — Ambulatory Visit
Admission: RE | Admit: 2023-09-01 | Discharge: 2023-09-01 | Disposition: A | Discharge status: Home / Self Care | Payer: Medicare Other | Source: Ambulatory Visit | Attending: Orthopaedic Surgery | Admitting: Orthopaedic Surgery

## 2023-09-01 DIAGNOSIS — M48061 Spinal stenosis, lumbar region without neurogenic claudication: Secondary | ICD-10-CM | POA: Diagnosis not present

## 2023-09-01 DIAGNOSIS — M47816 Spondylosis without myelopathy or radiculopathy, lumbar region: Secondary | ICD-10-CM | POA: Diagnosis not present

## 2023-09-01 DIAGNOSIS — M545 Low back pain, unspecified: Secondary | ICD-10-CM

## 2023-09-12 DIAGNOSIS — M545 Low back pain, unspecified: Secondary | ICD-10-CM | POA: Diagnosis not present

## 2023-09-17 ENCOUNTER — Other Ambulatory Visit: Payer: Self-pay

## 2023-10-03 ENCOUNTER — Other Ambulatory Visit: Payer: Self-pay

## 2023-10-03 ENCOUNTER — Other Ambulatory Visit (HOSPITAL_COMMUNITY): Payer: Self-pay

## 2023-10-03 DIAGNOSIS — G629 Polyneuropathy, unspecified: Secondary | ICD-10-CM | POA: Diagnosis not present

## 2023-10-03 MED ORDER — GABAPENTIN 300 MG PO CAPS
300.0000 mg | ORAL_CAPSULE | Freq: Two times a day (BID) | ORAL | 0 refills | Status: DC | PRN
Start: 2023-10-03 — End: 2023-12-16
  Filled 2023-10-03: qty 120, 60d supply, fill #0

## 2023-10-08 ENCOUNTER — Other Ambulatory Visit (HOSPITAL_COMMUNITY): Payer: Self-pay

## 2023-10-08 ENCOUNTER — Other Ambulatory Visit: Payer: Self-pay | Admitting: Physician Assistant

## 2023-10-08 ENCOUNTER — Other Ambulatory Visit: Payer: Self-pay

## 2023-10-08 MED ORDER — FAMOTIDINE 40 MG PO TABS
40.0000 mg | ORAL_TABLET | Freq: Every day | ORAL | 2 refills | Status: DC
  Filled 2023-10-08: qty 30, 30d supply, fill #0
  Filled 2023-11-18: qty 60, 60d supply, fill #1

## 2023-10-10 ENCOUNTER — Other Ambulatory Visit: Payer: Self-pay

## 2023-10-16 ENCOUNTER — Other Ambulatory Visit: Payer: Self-pay

## 2023-10-20 ENCOUNTER — Other Ambulatory Visit: Payer: Self-pay | Admitting: Internal Medicine

## 2023-10-20 ENCOUNTER — Other Ambulatory Visit (HOSPITAL_COMMUNITY): Payer: Self-pay

## 2023-10-20 MED ORDER — ZOLPIDEM TARTRATE 5 MG PO TABS
5.0000 mg | ORAL_TABLET | Freq: Every evening | ORAL | 1 refills | Status: DC | PRN
Start: 2023-10-20 — End: 2024-04-28
  Filled 2023-10-20 – 2023-10-31 (×2): qty 90, 90d supply, fill #0
  Filled 2024-01-27: qty 90, 90d supply, fill #1

## 2023-10-21 ENCOUNTER — Other Ambulatory Visit (HOSPITAL_COMMUNITY): Payer: Self-pay

## 2023-10-29 ENCOUNTER — Other Ambulatory Visit: Payer: Self-pay

## 2023-10-29 ENCOUNTER — Other Ambulatory Visit: Payer: Self-pay | Admitting: Internal Medicine

## 2023-10-29 MED ORDER — DICYCLOMINE HCL 20 MG PO TABS
20.0000 mg | ORAL_TABLET | Freq: Two times a day (BID) | ORAL | 1 refills | Status: DC
  Filled 2023-10-29: qty 180, 90d supply, fill #0
  Filled 2024-01-19: qty 180, 90d supply, fill #1

## 2023-10-31 ENCOUNTER — Other Ambulatory Visit: Payer: Self-pay

## 2023-10-31 ENCOUNTER — Other Ambulatory Visit (HOSPITAL_COMMUNITY): Payer: Self-pay

## 2023-11-03 ENCOUNTER — Other Ambulatory Visit (HOSPITAL_COMMUNITY): Payer: Self-pay

## 2023-11-03 DIAGNOSIS — R200 Anesthesia of skin: Secondary | ICD-10-CM | POA: Diagnosis not present

## 2023-11-03 MED ORDER — GABAPENTIN 300 MG PO CAPS
300.0000 mg | ORAL_CAPSULE | Freq: Three times a day (TID) | ORAL | 0 refills | Status: DC
  Filled 2023-11-18: qty 270, 90d supply, fill #0

## 2023-11-04 ENCOUNTER — Other Ambulatory Visit (HOSPITAL_COMMUNITY): Payer: Self-pay

## 2023-11-18 ENCOUNTER — Other Ambulatory Visit: Payer: Self-pay | Admitting: Internal Medicine

## 2023-11-18 ENCOUNTER — Other Ambulatory Visit (HOSPITAL_COMMUNITY): Payer: Self-pay

## 2023-11-18 ENCOUNTER — Other Ambulatory Visit: Payer: Self-pay

## 2023-11-18 MED ORDER — ALPRAZOLAM 0.5 MG PO TABS
ORAL_TABLET | ORAL | 3 refills | Status: DC
  Filled 2023-11-18: qty 90, 30d supply, fill #0
  Filled 2023-12-17: qty 90, 30d supply, fill #1
  Filled 2024-01-19: qty 90, 30d supply, fill #2
  Filled 2024-02-12 – 2024-02-16 (×2): qty 90, 30d supply, fill #3

## 2023-11-25 ENCOUNTER — Other Ambulatory Visit: Payer: Medicare Other

## 2023-11-25 DIAGNOSIS — I10 Essential (primary) hypertension: Secondary | ICD-10-CM | POA: Diagnosis not present

## 2023-11-25 DIAGNOSIS — E1169 Type 2 diabetes mellitus with other specified complication: Secondary | ICD-10-CM

## 2023-11-25 DIAGNOSIS — E118 Type 2 diabetes mellitus with unspecified complications: Secondary | ICD-10-CM

## 2023-11-25 DIAGNOSIS — E7800 Pure hypercholesterolemia, unspecified: Secondary | ICD-10-CM | POA: Diagnosis not present

## 2023-11-25 DIAGNOSIS — E785 Hyperlipidemia, unspecified: Secondary | ICD-10-CM | POA: Diagnosis not present

## 2023-11-26 LAB — LIPID PANEL
Cholesterol: 149 mg/dL (ref <200)
HDL: 57 mg/dL (ref >=40)
LDL Cholesterol (Calc): 76 mg/dL
Non-HDL Cholesterol (Calc): 92 mg/dL (ref <130)
Total CHOL/HDL Ratio: 2.6 (calc) (ref <5.0)
Triglycerides: 79 mg/dL (ref <150)

## 2023-11-26 LAB — HEPATIC FUNCTION PANEL
AG Ratio: 1.3 (calc) (ref 1.0–2.5)
ALT: 12 U/L (ref 9–46)
AST: 15 U/L (ref 10–35)
Albumin: 4.4 g/dL (ref 3.6–5.1)
Alkaline phosphatase (APISO): 66 U/L (ref 35–144)
Bilirubin, Direct: 0.2 mg/dL (ref 0.0–0.2)
Globulin: 3.3 g/dL (ref 1.9–3.7)
Indirect Bilirubin: 0.5 mg/dL (ref 0.2–1.2)
Total Bilirubin: 0.7 mg/dL (ref 0.2–1.2)
Total Protein: 7.7 g/dL (ref 6.1–8.1)

## 2023-11-26 LAB — MICROALBUMIN / CREATININE URINE RATIO
Creatinine, Urine: 12 mg/dL — ABNORMAL LOW (ref 20–320)
Microalb, Ur: <0.2 mg/dL

## 2023-11-26 LAB — HEMOGLOBIN A1C
Hgb A1c MFr Bld: 6.2 % — ABNORMAL HIGH (ref <5.7)
Mean Plasma Glucose: 131 mg/dL
eAG (mmol/L): 7.3 mmol/L

## 2023-11-26 NOTE — Progress Notes (Shared)
 Patient Care Team: Sylvan Evener, MD as PCP - General (Internal Medicine) Hugh Madura, MD as PCP - Cardiology (Cardiology)  Visit Date: 11/27/23  Subjective:   Chief Complaint  Patient presents with   Medical Management of Chronic Issues   Diabetes   Hypertension   Hyperlipidemia  Patient [name] [name],Male DOB:12-09-1950,73 y.o. OZH:[id number]   73 y.o.Male presents today for 6 months follow-up for Hypertension; Diabetes Mellitus II; Heart Disease. Patient has a past medical history of Arthritis; Abdominal Bloating LE Edema; Insomnia; GERD. Seen for his annual visit on 05/21/2024, in the interim has seen OrthoSurg, PT, and GI.   History of Hypertension treated with Amlodipine  10 mg daily, Carvedilol  25 mg twice daily. Blood Pressure: normotensive today at 110/60. Lower Extremity Edema treated with Lasix  40 mg daily.   History of Heart Disease treated with Sacubitril -Valsartan  97-103 mg twice daily, Spironolactone  25 mg twice daily; Hyperlipidemia treated with Atorvastatin  10 mg daily. 11/25/2023 Lipid Panel WNL.  History of Diabetes Mellitus, type II treated with Metformin  500 mg twice daily. 11/25/2023 HgbA1c 6.2, elevated from 5.9 in 05/2023, decreased from 6.4 in 11/2022. Eye exam UTD 05/2023.   11/25/2023 Hepatic Function Panel: WNL. Microalbumin/Creatinine: Creatinine 12, decreased from 35 in 11/2022.   History of Arthritis; Musculoskeletal Pain treated with Meloxicam  15 mg and Gabapentin  300 mg twice daily as needed.  History of GERD treated with Protonix  40 mg twice daily prior to meal, Pepcid  40 mg at bedtime. Abdominal Bloating treated with Bentyl  20 mg twice daily.  History of Insomnia treated with Ambien  5 mg as needed at bedtime.  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  HENT:  Negative for congestion.   Eyes:  Negative for blurred vision.  Respiratory:  Negative for cough and shortness of breath.   Cardiovascular:  Negative for chest pain, palpitations and leg swelling.  Gastrointestinal:  Negative for vomiting.  Musculoskeletal:  Negative for back pain.  Skin:  Negative for rash.  Neurological:  Negative for loss of consciousness and headaches.     Objective:  Vitals: BP 110/60   Pulse 60   Ht 5\' 10"  (1.778 m)   Wt 272 lb (123.4 kg)   SpO2 97%   BMI 39.03 kg/m   Physical Exam Constitutional:      General: He is not in acute distress.    Appearance: Normal appearance. He is not ill-appearing.  HENT:     Head: Normocephalic and atraumatic.  Cardiovascular:     Rate and Rhythm: Normal rate and regular rhythm.  Pulses: Normal pulses.          Dorsalis pedis pulses are 2+ on the right side and 2+ on the left side.       Posterior tibial pulses are 2+ on the right side and 2+ on the left side.     Heart sounds: Normal heart sounds. No murmur heard.    No friction rub. No gallop.  Pulmonary:     Effort: Pulmonary effort is normal. No respiratory distress.     Breath sounds: Normal breath sounds. No wheezing or rales.  Feet:     Right foot:     Skin integrity: Skin integrity normal.     Left foot:     Skin integrity: Skin integrity normal.  Skin:    General: Skin is warm and dry.  Neurological:      Mental Status: He is alert and oriented to person, place, and time. Mental status is at baseline.  Psychiatric:        Mood and Affect: Mood normal.        Behavior: Behavior normal.        Thought Content: Thought content normal.        Judgment: Judgment normal.     Results:  Studies Obtained And Personally Reviewed By Me:  Diabetic Foot Exam - Simple   Simple Foot Form Diabetic Foot exam was performed with the following findings: Yes 11/27/2023  9:52 AM  Visual Inspection No deformities, no ulcerations, no other skin breakdown bilaterally: Yes Sensation Testing Intact to touch and monofilament testing bilaterally: Yes Pulse Check Posterior Tibialis and Dorsalis pulse intact bilaterally: Yes Comments    Labs:     Component Value Date/Time   NA 140 05/19/2023 0923   NA 139 01/16/2021 0822   K 4.2 05/19/2023 0923   CL 102 05/19/2023 0923   CO2 29 05/19/2023 0923   GLUCOSE 113 (H) 05/19/2023 0923   BUN 14 05/19/2023 0923   BUN 12 01/16/2021 0822   CREATININE 1.12 05/19/2023 0923   CALCIUM  9.7 05/19/2023 0923   PROT 7.7 11/25/2023 0919   ALBUMIN 3.2 (L) 09/06/2020 1038   AST 15 11/25/2023 0919   ALT 12 11/25/2023 0919   ALKPHOS 48 09/06/2020 1038   BILITOT 0.7 11/25/2023 0919   GFRNONAA >60 12/03/2021 2008   GFRNONAA 73 04/21/2020 0904   GFRAA 85 04/21/2020 0904    Lab Results  Component Value Date   WBC 4.1 05/19/2023   HGB 12.4 (L) 05/19/2023   HCT 38.5 05/19/2023   MCV 99.0 05/19/2023   PLT 274 05/19/2023   Lab Results  Component Value Date   CHOL 149 11/25/2023   HDL 57 11/25/2023   LDLCALC 76 11/25/2023   TRIG 79 11/25/2023   CHOLHDL 2.6 11/25/2023   Lab Results  Component Value Date   HGBA1C 6.2 (H) 11/25/2023    Lab Results  Component Value Date   TSH 0.564 09/06/2020    Lab Results  Component Value Date   PSA 0.58 05/19/2023   PSA 0.48 05/13/2022   PSA 0.54 05/01/2021     Assessment & Plan:  Hypertension treated with Amlodipine  10 mg  daily, Carvedilol  25 mg twice daily. Blood Pressure: normotensive today at 110/60. Lower Extremity Edema treated with Lasix  40 mg daily.   Heart Disease treated with Sacubitril -Valsartan  97-103 mg twice daily, Spironolactone  25 mg twice daily.   Hyperlipidemia treated with Atorvastatin  10 mg daily. 11/25/2023 Lipid Panel WNL.  Diabetes Mellitus, type II treated with Metformin   500 mg twice daily. 11/25/2023 HgbA1c 6.2, elevated from 5.9 in 05/2023, decreased from 6.4 in 11/2022. Eye exam UTD 05/2023.   11/25/2023 Hepatic Function Panel: WNL. Microalbumin/Creatinine: Creatinine 12, decreased from 35 in 11/2022.   Arthritis; Musculoskeletal Pain treated with Meloxicam  15 mg and Gabapentin  300 mg twice daily as needed.  GERD treated with Protonix  40 mg twice daily prior to meal, Pepcid  40 mg at bedtime.   Abdominal Bloating treated with Bentyl  20 mg twice daily.  Insomnia treated with Ambien  5 mg as needed at bedtime.    I,Emily Lagle,acting as a Neurosurgeon for Sylvan Evener, MD.,have documented all relevant documentation on the behalf of Sylvan Evener, MD,as directed by  Sylvan Evener, MD while in the presence of Sylvan Evener, MD.   ***

## 2023-11-27 ENCOUNTER — Encounter: Payer: Self-pay | Admitting: Internal Medicine

## 2023-11-27 ENCOUNTER — Ambulatory Visit: Payer: Medicare Other | Admitting: Internal Medicine

## 2023-11-27 VITALS — BP 110/60 | HR 60 | Ht 70.0 in | Wt 272.0 lb

## 2023-11-27 DIAGNOSIS — E1169 Type 2 diabetes mellitus with other specified complication: Secondary | ICD-10-CM

## 2023-11-27 DIAGNOSIS — E785 Hyperlipidemia, unspecified: Secondary | ICD-10-CM | POA: Diagnosis not present

## 2023-11-27 DIAGNOSIS — I519 Heart disease, unspecified: Secondary | ICD-10-CM

## 2023-11-27 DIAGNOSIS — K582 Mixed irritable bowel syndrome: Secondary | ICD-10-CM

## 2023-11-27 DIAGNOSIS — G4700 Insomnia, unspecified: Secondary | ICD-10-CM | POA: Diagnosis not present

## 2023-11-27 DIAGNOSIS — M7918 Myalgia, other site: Secondary | ICD-10-CM

## 2023-11-27 DIAGNOSIS — Z7984 Long term (current) use of oral hypoglycemic drugs: Secondary | ICD-10-CM | POA: Diagnosis not present

## 2023-11-27 DIAGNOSIS — K219 Gastro-esophageal reflux disease without esophagitis: Secondary | ICD-10-CM | POA: Diagnosis not present

## 2023-11-27 DIAGNOSIS — I10 Essential (primary) hypertension: Secondary | ICD-10-CM

## 2023-11-27 DIAGNOSIS — I5042 Chronic combined systolic (congestive) and diastolic (congestive) heart failure: Secondary | ICD-10-CM

## 2023-11-27 DIAGNOSIS — M1990 Unspecified osteoarthritis, unspecified site: Secondary | ICD-10-CM

## 2023-11-27 DIAGNOSIS — Z6839 Body mass index (BMI) 39.0-39.9, adult: Secondary | ICD-10-CM

## 2023-11-27 DIAGNOSIS — F5105 Insomnia due to other mental disorder: Secondary | ICD-10-CM

## 2023-11-27 DIAGNOSIS — E7800 Pure hypercholesterolemia, unspecified: Secondary | ICD-10-CM

## 2023-11-27 DIAGNOSIS — Z8659 Personal history of other mental and behavioral disorders: Secondary | ICD-10-CM

## 2023-12-03 ENCOUNTER — Encounter: Payer: Self-pay | Admitting: Internal Medicine

## 2023-12-03 NOTE — Patient Instructions (Signed)
 Check B-met on Meloxicam  in 6 weeks. Medicare wellness due in December.

## 2023-12-16 ENCOUNTER — Telehealth: Payer: Self-pay | Admitting: Internal Medicine

## 2023-12-16 ENCOUNTER — Encounter: Payer: Self-pay | Admitting: Internal Medicine

## 2023-12-16 ENCOUNTER — Ambulatory Visit (INDEPENDENT_AMBULATORY_CARE_PROVIDER_SITE_OTHER): Admitting: Internal Medicine

## 2023-12-16 VITALS — BP 110/60 | HR 76 | Ht 70.0 in

## 2023-12-16 DIAGNOSIS — M792 Neuralgia and neuritis, unspecified: Secondary | ICD-10-CM

## 2023-12-16 NOTE — Progress Notes (Signed)
 Patient Care Team: Sylvan Evener, MD as PCP - General (Internal Medicine) Hugh Madura, MD as PCP - Cardiology (Cardiology)  Visit Date: 12/16/23  Subjective:  Patient RU:EAVWUJW [name] [name] DOB:Jun 29, 1951,73 y.o. JXB:[id number]  Chief Complaint  Patient presents with   Leg Pain    Burning and tingling under bottom of his feet.   73 y.o.Male presents today for acute visit with Sensory Changes, Feet Bilaterally. Patient has a past medical history of Diabetes Mellitus. Says that he has been having these sensations of burning and tingling in the bottoms of his feet for a while now, thought that it may have been associated with his Right TKA. He says that he saw Dr. Donna Fus for this, who prescribed Gabapentin  300 mg to take three times daily, and who he will follow-up with on July 25th. In March saw Orthopedist, Dr. Dalldorf, where he had a Lumbar Spine MRI w/ findings of: 1.) multilevel spondylosis superimposed on a congenitally short spinal canal and there are prominent paraspinal osteophytes at multiple levels as above. 2.) Moderate multifactorial spinal stenosis at L2-3 w/ lateral recess narrowing bilaterally. Mild lateral recess and foraminal narrowing bilaterally at L3-4. 3.) Mild multifactorial spinal stenosis at L4-5 w/ moderate lateral recess - complete study found under Studies. He says that elevating his legs improves the sensation, and it does resolve temporarily after taking Gabapentin . He asks about other ways to relieve his pain, and when walking is mentioned he says that he does a lot of work in his garden.   History of Diabetes Mellitus, type II treated with Metformin  500 mg twice daily. 11/25/2023 HgbA1c 6.2. Says he is going to have his eye exam tomorrow.   Does also have history of Anemia. On 05/19/2023 CBC his RBC was 3.89 (lowest RBC was 3.17 in 2022) and Hemoglobin was 12.4 (lowest Hgb was 10.4 in 2022). Folate and B12 panel were ordered at one point, but did not appear to  have been collected as there are no results regarding that order. He says that he has been eating red meat.  Past Medical History:  Diagnosis Date   Anxiety    Arthritis    Bradycardia    hx of  in 30's medications changed   Diabetes mellitus without complication (HCC)    DJD (degenerative joint disease) of knee    Left   Hx of adenomatous colonic polyps 12/2019   2 diminutive   Hypertension   No Known Allergies Past Surgical History:  Procedure Laterality Date   CARDIAC CATHETERIZATION  07/23/2018   CARPAL TUNNEL RELEASE Bilateral 2023   CHOLECYSTECTOMY N/A 02/04/2016   Procedure: LAPAROSCOPIC CHOLECYSTECTOMY;  Surgeon: Joyce Nixon, MD;  Location: [facility];  Service: General;  Laterality: N/A;   COLONOSCOPY  12/13/2019   ERCP N/A 02/02/2016   Procedure: ENDOSCOPIC RETROGRADE CHOLANGIOPANCREATOGRAPHY (ERCP);  Surgeon: Kenney Peacemaker, MD;  Location: [facility];  Service: Endoscopy;  Laterality: N/A;   HERNIA REPAIR     RIGHT/LEFT HEART CATH AND CORONARY ANGIOGRAPHY N/A 07/23/2018   Procedure: RIGHT/LEFT HEART CATH AND CORONARY ANGIOGRAPHY;  Surgeon: Arty Binning, MD;  Location: [facility];  Service: Cardiovascular;  Laterality: N/A;   SPINE SURGERY     TOTAL KNEE ARTHROPLASTY Left 07/28/2018   Procedure: TOTAL KNEE ARTHROPLASTY;  Surgeon: Dayne Even, MD;  Location: [facility];  Service: Orthopedics;  Laterality: Left;   TOTAL KNEE ARTHROPLASTY Right 09/05/2020   Procedure: RIGHT TOTAL KNEE ARTHROPLASTY;  Surgeon: Dayne Even, MD;  Location: [facility];  Service: Orthopedics;  Laterality: Right;   Family History  Problem Relation Age of Onset   Stroke Mother    Hypertension Mother    Colon cancer Neg Hx    Stomach cancer Neg Hx    Rectal cancer Neg Hx    Pancreatic cancer Neg Hx    Esophageal cancer Neg Hx   Sister recently had a Stroke Nephew unfortunately recently passed away Social History   Social History Narrative   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  All other systems reviewed and are negative.  Objective:  Vitals: BP 110/60   Pulse 76   Ht 5' 10 (1.778 m)   SpO2 98%   BMI 39.03 kg/m   Physical Exam Vitals and nursing note reviewed.  Constitutional:      General: He is not in acute distress.    Appearance: Normal appearance. He is not ill-appearing.  HENT:     Head: Normocephalic and atraumatic.  Pulmonary:     Effort: Pulmonary effort is normal.  Musculoskeletal:     Lumbar back: Negative right straight leg raise test and negative left straight leg raise test.  Feet:     Right foot:     Skin integrity: Skin integrity normal.     Left foot:     Skin integrity: Skin integrity normal.     Comments: Sensation intact bilaterally Skin:    General: Skin is warm and dry.  Neurological:     Mental Status: He is alert and oriented to person, place, and time. Mental status is at baseline.  Psychiatric:        Mood and Affect: Mood normal.        Behavior: Behavior normal.        Thought Content: Thought content normal.        Judgment: Judgment normal.     Results:  Studies Obtained And Personally Reviewed By Me:  MRI LUMBAR SPINE WITHOUT CONTRAST  09/11/2023  CLINICAL DATA:  Chronic low back pain with bilateral leg pain for 2 years. No known injury or prior relevant surgery.   COMPARISON:  Abdominopelvic CT 04/17/2023 and 12/13/2017.   FINDINGS: Segmentation: Conventional anatomy assumed, with the last open disc space designated L5-S1.Concordant with prior CT (assuming small ribs at T12).   Alignment:  Physiologic.   Vertebrae: No worrisome osseous lesion, acute fracture or pars defect. The lumbar pedicles are diffusely short on a congenital basis, and there are prominent paraspinal osteophytes at multiple levels. In  addition, there are partially bridging anterior osteophytes of the sacroiliac joints bilaterally.   Conus medullaris: Extends to the L1 level. There is posterior displacement of the conus medullaris by chronic central osteophytes at T12-L1. No significant abnormality of the cauda equina demonstrated. Mildly prominent epidural fat inferiorly.   Paraspinal and other soft tissues: No acute paraspinal findings. Prominent paraspinal osteophytes at multiple levels.   Disc levels:   Sagittal images demonstrate no significant disc space findings within the visualized lower thoracic spine.   T12-L1: Preserved disc height with annular disc bulging and endplate osteophytes. There are large central osteophytes within the spinal canal, with mass effect on the conus medullaris, unchanged from previous CT. Mild facet hypertrophy. No significant foraminal narrowing.   L1-2: Preserved disc height with mild disc bulging,  facet and ligamentous hypertrophy. No significant spinal stenosis or foraminal narrowing.   L2-3: Mild loss of disc height with annular disc bulging, endplate osteophytes, facet and ligamentous hypertrophy. Resulting moderate multifactorial spinal stenosis with lateral recess narrowing bilaterally. Mild foraminal narrowing bilaterally without definite L2 nerve root encroachment.   L3-4: Mild loss of disc height with annular disc bulging and endplate osteophytes. Mild facet and ligamentous hypertrophy. No significant central spinal stenosis. Mild narrowing of the right lateral recess and both foramina.   L4-5: Chronic loss of disc height with annular disc bulging and endplate osteophytes asymmetric to the left. Moderate facet and ligamentous hypertrophy. Resulting mild multifactorial spinal stenosis with moderate lateral recess and foraminal narrowing bilaterally which appears chronic.   L5-S1: As numbered, this disc space appears oblique transitional. Disc height and  hydration are maintained. Mild facet hypertrophy. No spinal stenosis or significant foraminal narrowing.   IMPRESSION: 1. No acute findings or gross change from previous abdominal CT.  2. Multilevel spondylosis superimposed on a congenitally short spinal canal. There are prominent paraspinal osteophytes at multiple levels as detailed above.  3. Chronic large central osteophytes at T12-L1 with mass effect on the conus medullaris, unchanged from previous CT.  4. Moderate multifactorial spinal stenosis at L2-3 with lateral recess narrowing bilaterally.  5. Mild lateral recess and foraminal narrowing bilaterally at L3-4.  6. Mild multifactorial spinal stenosis at L4-5 with moderate lateral recess and foraminal narrowing bilaterally.   Labs:     Component Value Date/Time   NA 140 05/19/2023 0923   NA 139 01/16/2021 0822   K 4.2 05/19/2023 0923   CL 102 05/19/2023 0923   CO2 29 05/19/2023 0923   GLUCOSE 113 (H) 05/19/2023 0923   BUN 14 05/19/2023 0923   BUN 12 01/16/2021 0822   CREATININE 1.12 05/19/2023 0923   CALCIUM  9.7 05/19/2023 0923   PROT 7.7 11/25/2023 0919   ALBUMIN 3.2 (L) 09/06/2020 1038   AST 15 11/25/2023 0919   ALT 12 11/25/2023 0919   ALKPHOS 48 09/06/2020 1038   BILITOT 0.7 11/25/2023 0919   GFRNONAA >60 12/03/2021 2008   GFRNONAA 73 04/21/2020 0904   GFRAA 85 04/21/2020 0904    Lab Results  Component Value Date   WBC 4.1 05/19/2023   HGB 12.4 (L) 05/19/2023   HCT 38.5 05/19/2023   MCV 99.0 05/19/2023   PLT 274 05/19/2023   Lab Results  Component Value Date   CHOL 149 11/25/2023   HDL 57 11/25/2023   LDLCALC 76 11/25/2023   TRIG 79 11/25/2023   CHOLHDL 2.6 11/25/2023   Lab Results  Component Value Date   HGBA1C 6.2 (H) 11/25/2023    Lab Results  Component Value Date   TSH 0.564 09/06/2020    Lab Results  Component Value Date   PSA 0.58 05/19/2023   PSA 0.48 05/13/2022   PSA 0.54 05/01/2021     Assessment & Plan:   Peripheral Neuropathy,  Bilateral Lower Extremities: Says that he has been having these sensations of burning and tingling in the bottoms of his feet for a while now (apparently about 2 years according to MRI indications). Dr. Donna Fus has prescribed Gabapentin  300 mg to take three times daily for this, and he will follow-up with him on July 25th. In March saw Orthopedist, Dr. Dalldorf, where he had a Lumbar Spine MRI with detailed findings above under Studies. He says that elevating his legs improves the sensation, and it does resolve temporarily after taking Gabapentin . He asks about  other ways to relieve his pain, and when walking is mentioned he says that he does a lot of work in his garden. Sensations intact bilaterally to palpation on exam. Discussed causes of neuropathy and recommendations for relief.  Keep appointment with Dr. Donna Fus in July.    Diabetes Mellitus, type II treated with Metformin  500 mg twice daily. 11/25/2023 HgbA1c 6.2.% Says he is going to have his eye exam tomorrow.   Does also have history of Anemia. On 05/19/2023 CBC his RBC was 3.89 (lowest RBC was 3.17 in 2022) and Hemoglobin was 12.4 (lowest Hgb was 10.4 in 2022). Folate and B12 panel were ordered at one point, but did not appear to have been collected as there are no results regarding that order. He says that he has been eating red meat. Highest Hgb  was 13.6 grams in May 2023.   Plan: checking B12, folate, iron and iron binding capacity   Addendum: B12 is low normal at 264  Normal is [phone]. I think he would benefit from B12 injections.    I,Emily Lagle,acting as a Neurosurgeon for Sylvan Evener, MD.,have documented all relevant documentation on the behalf of Sylvan Evener, MD,as directed by  Sylvan Evener, MD while in the presence of Sylvan Evener, MD.   I, Sylvan Evener, MD, have reviewed all documentation for this visit. The documentation on 12/18/23 for the exam, diagnosis, procedures, and orders are all accurate and complete.

## 2023-12-16 NOTE — Telephone Encounter (Signed)
 Patient called and said he has had burning pain in both legs for 2 to 3 weeks. and causes tingiling under the bottom of his feet when it happens and will swell a little, the pain will come and go, Pain will go away when they are elevated and come back if he sits on a sofa. Sometimes wiping alchohol on them will help cool them down for a few hours.   Does he just need an appointment with you or does he need to go to the hospital?

## 2023-12-17 ENCOUNTER — Ambulatory Visit: Payer: Self-pay | Admitting: Internal Medicine

## 2023-12-17 ENCOUNTER — Other Ambulatory Visit: Payer: Self-pay

## 2023-12-17 ENCOUNTER — Other Ambulatory Visit (HOSPITAL_COMMUNITY): Payer: Self-pay

## 2023-12-17 LAB — IRON,TIBC AND FERRITIN PANEL
%SAT: 23 % (ref 20–48)
Ferritin: 216 ng/mL (ref 24–380)
Iron: 73 ug/dL (ref 50–180)
TIBC: 316 ug/dL (ref 250–425)

## 2023-12-17 LAB — VITAMIN B12: Vitamin B-12: 264 pg/mL (ref 200–1100)

## 2023-12-17 LAB — FOLATE: Folate: 14.1 ng/mL

## 2023-12-17 MED ORDER — IRON (FERROUS SULFATE) 325 (65 FE) MG PO TABS
ORAL_TABLET | ORAL | 2 refills | Status: DC
  Filled 2023-12-17: qty 90, 60d supply, fill #0
  Filled 2024-02-12: qty 90, 90d supply, fill #1

## 2023-12-18 ENCOUNTER — Other Ambulatory Visit: Payer: Self-pay

## 2023-12-18 ENCOUNTER — Ambulatory Visit (INDEPENDENT_AMBULATORY_CARE_PROVIDER_SITE_OTHER)

## 2023-12-18 VITALS — BP 102/60 | HR 74 | Ht 70.0 in | Wt 272.0 lb

## 2023-12-18 DIAGNOSIS — E538 Deficiency of other specified B group vitamins: Secondary | ICD-10-CM

## 2023-12-18 DIAGNOSIS — D649 Anemia, unspecified: Secondary | ICD-10-CM

## 2023-12-18 MED ORDER — CYANOCOBALAMIN 1000 MCG/ML IJ SOLN
1000.0000 ug | Freq: Once | INTRAMUSCULAR | Status: AC
  Administered 2023-12-18: 1000 ug via INTRAMUSCULAR

## 2023-12-18 NOTE — Progress Notes (Signed)
 Patient received 1 cc IM Vitamin B12 injection today. Return in one week for another injection.

## 2023-12-26 ENCOUNTER — Telehealth: Payer: Self-pay

## 2023-12-26 ENCOUNTER — Ambulatory Visit

## 2023-12-26 DIAGNOSIS — E538 Deficiency of other specified B group vitamins: Secondary | ICD-10-CM

## 2023-12-26 MED ORDER — CYANOCOBALAMIN 1000 MCG/ML IJ SOLN
1000.0000 ug | Freq: Once | INTRAMUSCULAR | Status: AC
  Administered 2023-12-26: 1000 ug via INTRAMUSCULAR

## 2023-12-26 NOTE — Progress Notes (Unsigned)
 Patient presents for B12 injection. Given icc IM by CMA. Patient wants to come here monthly for this. Return in one month. MJB, MD

## 2023-12-26 NOTE — Telephone Encounter (Signed)
 B-12 injection given 12/26/2023 in left ventrogluteal patient tolerated well and stated he has his appointments already set up.   Ilona Malta, CMA

## 2023-12-26 NOTE — Patient Instructions (Signed)
 We will check B12, folate, Iron  and TIBC. I beieve he may have peripheral neuropathy and may have B12 deficiency. Will advise further after lab results are received.

## 2024-01-02 ENCOUNTER — Ambulatory Visit (INDEPENDENT_AMBULATORY_CARE_PROVIDER_SITE_OTHER)

## 2024-01-02 ENCOUNTER — Telehealth: Payer: Self-pay

## 2024-01-02 DIAGNOSIS — E538 Deficiency of other specified B group vitamins: Secondary | ICD-10-CM | POA: Diagnosis not present

## 2024-01-02 MED ORDER — CYANOCOBALAMIN 1000 MCG/ML IJ SOLN
1000.0000 ug | Freq: Once | INTRAMUSCULAR | Status: AC
  Administered 2024-01-02: 1000 ug via INTRAMUSCULAR

## 2024-01-02 NOTE — Telephone Encounter (Signed)
 Patient came in office for b-12 injection. Patient tolerated injection well. Injection was given in right ventrogluteal. Patient stated he has an appointment for next Wednesday 01/07/2024

## 2024-01-06 ENCOUNTER — Other Ambulatory Visit: Payer: Self-pay

## 2024-01-07 ENCOUNTER — Ambulatory Visit

## 2024-01-19 ENCOUNTER — Other Ambulatory Visit (HOSPITAL_COMMUNITY): Payer: Self-pay

## 2024-01-19 ENCOUNTER — Other Ambulatory Visit: Payer: Self-pay

## 2024-01-19 ENCOUNTER — Other Ambulatory Visit: Payer: Self-pay | Admitting: Internal Medicine

## 2024-01-19 MED ORDER — PANTOPRAZOLE SODIUM 40 MG PO TBEC
40.0000 mg | DELAYED_RELEASE_TABLET | Freq: Two times a day (BID) | ORAL | 2 refills | Status: AC
  Filled 2024-01-19: qty 180, 90d supply, fill #0
  Filled 2024-04-12: qty 180, 90d supply, fill #1
  Filled 2024-07-12: qty 180, 90d supply, fill #2

## 2024-01-19 MED ORDER — FAMOTIDINE 40 MG PO TABS
40.0000 mg | ORAL_TABLET | Freq: Every day | ORAL | 2 refills | Status: DC
  Filled 2024-01-19: qty 30, 30d supply, fill #0
  Filled 2024-02-12: qty 60, 60d supply, fill #1

## 2024-01-20 ENCOUNTER — Other Ambulatory Visit (HOSPITAL_COMMUNITY): Payer: Self-pay

## 2024-01-20 ENCOUNTER — Other Ambulatory Visit: Payer: Self-pay

## 2024-01-27 ENCOUNTER — Other Ambulatory Visit: Payer: Self-pay

## 2024-01-27 ENCOUNTER — Other Ambulatory Visit (HOSPITAL_COMMUNITY): Payer: Self-pay

## 2024-01-30 DIAGNOSIS — M48062 Spinal stenosis, lumbar region with neurogenic claudication: Secondary | ICD-10-CM | POA: Diagnosis not present

## 2024-02-06 ENCOUNTER — Ambulatory Visit (INDEPENDENT_AMBULATORY_CARE_PROVIDER_SITE_OTHER)

## 2024-02-06 VITALS — BP 100/70 | HR 65 | Ht 70.0 in | Wt 272.0 lb

## 2024-02-06 DIAGNOSIS — E538 Deficiency of other specified B group vitamins: Secondary | ICD-10-CM

## 2024-02-06 MED ORDER — CYANOCOBALAMIN 1000 MCG/ML IJ SOLN
1000.0000 ug | Freq: Once | INTRAMUSCULAR | Status: AC
  Administered 2024-02-06: 1000 ug via INTRAMUSCULAR

## 2024-02-06 NOTE — Progress Notes (Signed)
 Patient received 1 cc IM Vitamin B12 injection today LUQ. Patient tolerated well.

## 2024-02-12 ENCOUNTER — Other Ambulatory Visit (HOSPITAL_BASED_OUTPATIENT_CLINIC_OR_DEPARTMENT_OTHER): Payer: Self-pay

## 2024-02-12 ENCOUNTER — Other Ambulatory Visit: Payer: Self-pay

## 2024-02-12 ENCOUNTER — Other Ambulatory Visit (HOSPITAL_COMMUNITY): Payer: Self-pay

## 2024-02-12 ENCOUNTER — Other Ambulatory Visit: Payer: Self-pay | Admitting: Internal Medicine

## 2024-02-12 MED ORDER — MELOXICAM 15 MG PO TABS
15.0000 mg | ORAL_TABLET | Freq: Every day | ORAL | 1 refills | Status: AC
  Filled 2024-02-12: qty 90, 90d supply, fill #0
  Filled 2024-05-13: qty 90, 90d supply, fill #1

## 2024-02-12 MED ORDER — FUROSEMIDE 40 MG PO TABS
40.0000 mg | ORAL_TABLET | Freq: Every day | ORAL | 3 refills | Status: AC
  Filled 2024-02-12: qty 90, 90d supply, fill #0
  Filled 2024-05-13: qty 90, 90d supply, fill #1
  Filled 2024-08-11: qty 90, 90d supply, fill #2

## 2024-02-16 ENCOUNTER — Other Ambulatory Visit: Payer: Self-pay

## 2024-02-20 ENCOUNTER — Other Ambulatory Visit: Payer: Self-pay

## 2024-02-20 ENCOUNTER — Other Ambulatory Visit (HOSPITAL_COMMUNITY): Payer: Self-pay

## 2024-02-20 DIAGNOSIS — M48062 Spinal stenosis, lumbar region with neurogenic claudication: Secondary | ICD-10-CM | POA: Diagnosis not present

## 2024-02-20 MED ORDER — GABAPENTIN 300 MG PO CAPS
300.0000 mg | ORAL_CAPSULE | ORAL | 0 refills | Status: AC
  Filled 2024-02-20: qty 10, 5d supply, fill #0

## 2024-02-20 MED ORDER — GABAPENTIN 300 MG PO CAPS
600.0000 mg | ORAL_CAPSULE | Freq: Every day | ORAL | 0 refills | Status: DC | PRN
  Filled 2024-02-20: qty 10, 5d supply, fill #0

## 2024-02-20 MED ORDER — PREGABALIN 50 MG PO CAPS
50.0000 mg | ORAL_CAPSULE | Freq: Two times a day (BID) | ORAL | 2 refills | Status: DC
  Filled 2024-02-20: qty 60, 30d supply, fill #0
  Filled 2024-03-19: qty 60, 30d supply, fill #1
  Filled 2024-04-15: qty 60, 30d supply, fill #2

## 2024-03-11 ENCOUNTER — Ambulatory Visit (INDEPENDENT_AMBULATORY_CARE_PROVIDER_SITE_OTHER)

## 2024-03-11 VITALS — BP 110/70 | HR 60 | Ht 70.0 in | Wt 282.0 lb

## 2024-03-11 DIAGNOSIS — E538 Deficiency of other specified B group vitamins: Secondary | ICD-10-CM | POA: Diagnosis not present

## 2024-03-11 MED ORDER — CYANOCOBALAMIN 1000 MCG/ML IJ SOLN
1000.0000 ug | Freq: Once | INTRAMUSCULAR | Status: AC
  Administered 2024-03-11: 1000 ug via INTRAMUSCULAR

## 2024-03-11 NOTE — Progress Notes (Addendum)
 [name], MD, have reviewed all documentation for this visit. The documentation on 03/11/2024 for the exam, diagnosis, procedures, and orders are all accurate and complete. Patient received 1 cc IM Vitamin B12 injection today LUQ. Patient tolerated well.

## 2024-03-17 ENCOUNTER — Other Ambulatory Visit (HOSPITAL_COMMUNITY): Payer: Self-pay

## 2024-03-17 ENCOUNTER — Other Ambulatory Visit: Payer: Self-pay

## 2024-03-17 ENCOUNTER — Other Ambulatory Visit: Payer: Self-pay | Admitting: Internal Medicine

## 2024-03-18 ENCOUNTER — Other Ambulatory Visit: Payer: Self-pay

## 2024-03-18 ENCOUNTER — Other Ambulatory Visit (HOSPITAL_COMMUNITY): Payer: Self-pay

## 2024-03-18 MED ORDER — ALPRAZOLAM 0.5 MG PO TABS
ORAL_TABLET | ORAL | 3 refills | Status: DC
  Filled 2024-03-18 (×2): qty 90, 30d supply, fill #0
  Filled 2024-04-15: qty 90, 30d supply, fill #1
  Filled 2024-05-13: qty 90, 30d supply, fill #2
  Filled 2024-06-16: qty 90, 30d supply, fill #3

## 2024-03-23 ENCOUNTER — Ambulatory Visit: Attending: Cardiology | Admitting: Cardiology

## 2024-03-23 VITALS — BP 108/62 | HR 65 | Ht 70.0 in | Wt 285.0 lb

## 2024-03-23 DIAGNOSIS — E118 Type 2 diabetes mellitus with unspecified complications: Secondary | ICD-10-CM | POA: Diagnosis not present

## 2024-03-23 DIAGNOSIS — I5042 Chronic combined systolic (congestive) and diastolic (congestive) heart failure: Secondary | ICD-10-CM

## 2024-03-23 DIAGNOSIS — I10 Essential (primary) hypertension: Secondary | ICD-10-CM | POA: Diagnosis not present

## 2024-03-23 NOTE — Progress Notes (Signed)
 Cardiology Office Note:  .   Date:  03/23/2024  ID:  Carlin LOISE Meissner, DOB 20-Oct-1950, MRN [id number] PCP: Perri Ronal PARAS, MD  [facility] Providers Cardiologist:  Oneil Parchment, MD     History of Present Illness: .   DELMON ANDRADA is a 73 y.o. male Discussed the use of AI scribe software  History of Present Illness KHADIR ROAM is a 73 year old male with chronic systolic heart failure who presents for follow-up.  He experiences occasional dizziness when standing, which he attributes to his medication, Entresto . His Entresto  dose was previously reduced to 49/51 mg twice a day due to dizziness, but it was increased back to the full dose in November 2024 due to elevated blood pressure. No fainting or significant problems with the current dose.  He has a history of chronic systolic heart failure with an ejection fraction (EF) that improved from 35% in June 2019 to 60% on an echocardiogram in 2021. A recent echocardiogram on January 07, 2023, showed an EF of 50% with a 3D EF of 52% and trivial mitral regurgitation. He is on carvedilol  25 mg twice a day, amlodipine  10 mg daily, and furosemide  40 mg daily.  He has nonobstructive coronary artery disease with a cardiac catheterization in 2020 showing minimal mid LAD nonrestrictive atherosclerosis of 25% and a patent RCA. His wedge pressure was 4 mmHg. A Holter monitor in 2019 showed a PVC burden of 5% with no atrial fibrillation.  He is stable on atorvastatin  10 mg daily with an LDL of 50, less than 55. His prior LDL was 55. His A1c is 6.2, hemoglobin is 12.4, creatinine is 1.1, and ALT is 12. He exercises regularly.  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 He uses valet parking services for convenience.       Studies Reviewed: SABRA   EKG Interpretation Date/Time:  Tuesday March 23 2024 09:04:03 EDT Ventricular Rate:  65 PR Interval:  218 QRS Duration:  90 QT Interval:  410 QTC Calculation: 426 R  Axis:   -18  Text Interpretation: Sinus rhythm with 1st degree A-V block When compared with ECG of 29-Nov-2022 17:40, No significant change since last tracing Confirmed by Parchment Oneil ([id number]) on 03/23/2024 9:20:01 AM    Results LABS LDL: 76 (11/2023) A1c: 6.2 Hemoglobin: 12.4 Creatinine: 1.1 ALT: 12  DIAGNOSTIC Echocardiogram: Ejection fraction 50%, 3D ejection fraction 52%, trivial mitral regurgitation (01/07/2023) Cardiac catheterization: Minimal mid LAD nonrestrictive atherosclerosis 25%, patent RCA, wedge pressure 4 mmHg (2020) Holter monitor: PVC burden 5%, no atrial fibrillation (2019) ECG: Normal Risk Assessment/Calculations:            Physical Exam:   VS:  BP 108/62 (BP Location: Left Arm, Patient Position: Sitting, Cuff Size: Large)   Pulse 65   Ht 5' 10 (1.778 m)   Wt 285 lb (129.3 kg)   BMI 40.89 kg/m    Wt Readings from Last 3 Encounters:  03/23/24 285 lb (129.3 kg)  03/11/24 282 lb (127.9 kg)  02/06/24 272 lb (123.4 kg)    GEN: Well nourished, well developed in no acute distress NECK: No JVD; No carotid bruits CARDIAC: RRR, no murmurs, no rubs, no gallops RESPIRATORY:  Clear to auscultation without rales, wheezing or rhonchi  ABDOMEN: Soft, non-tender, non-distended EXTREMITIES:  No edema; No deformity   ASSESSMENT AND PLAN: .    Assessment and Plan Assessment & Plan Chronic systolic heart failure Chronic systolic heart failure with improved ejection  fraction from 35% in June 2019 to 60% in 2021, and 50% with 3D ejection fraction of 52% on echocardiogram in July 2024. Occasional dizziness when standing, possibly related to Entresto . Blood pressure is well controlled, and Entresto  was previously reduced but increased back to full dose due to elevated blood pressure. Currently stable on medications. - Continue Entresto  at full dose as long as there are no issues with fainting or significant dizziness. - Encourage adherence to current medication regimen  including carvedilol , amlodipine , and furosemide .  Nonobstructive coronary artery disease Nonobstructive coronary artery disease with minimal mid LAD nonrestrictive atherosclerosis of 25% noted on cardiac catheterization in 2020. Patent RCA. - Continue atorvastatin  10 mg daily for lipid management. - Encourage adherence to a heart-healthy diet, such as the Mediterranean diet.  Aortic atherosclerosis Aortic atherosclerosis. - Continue current cardiovascular medications including atorvastatin  and antihypertensives. - Encourage adherence to a heart-healthy diet and lifestyle.  Essential hypertension Essential hypertension is well controlled with current medication regimen. Blood pressure is stable and within target range. - Continue current antihypertensive medications including carvedilol , amlodipine , and Entresto .  Type 2 diabetes mellitus without complications Type 2 diabetes mellitus is well controlled with an A1c of 6.2%. - Encourage adherence to a healthy diet and regular exercise.         Dispo: 1 yr  Signed, Oneil Parchment, MD

## 2024-03-23 NOTE — Patient Instructions (Signed)

## 2024-03-29 DIAGNOSIS — M48062 Spinal stenosis, lumbar region with neurogenic claudication: Secondary | ICD-10-CM | POA: Diagnosis not present

## 2024-04-12 ENCOUNTER — Other Ambulatory Visit: Payer: Self-pay | Admitting: Internal Medicine

## 2024-04-12 ENCOUNTER — Other Ambulatory Visit (HOSPITAL_COMMUNITY): Payer: Self-pay

## 2024-04-12 ENCOUNTER — Other Ambulatory Visit: Payer: Self-pay | Admitting: Cardiology

## 2024-04-12 ENCOUNTER — Other Ambulatory Visit: Payer: Self-pay

## 2024-04-12 MED ORDER — DICYCLOMINE HCL 20 MG PO TABS
20.0000 mg | ORAL_TABLET | Freq: Two times a day (BID) | ORAL | 1 refills | Status: AC
  Filled 2024-04-12: qty 180, 90d supply, fill #0
  Filled 2024-07-12: qty 60, 30d supply, fill #1

## 2024-04-13 ENCOUNTER — Ambulatory Visit (INDEPENDENT_AMBULATORY_CARE_PROVIDER_SITE_OTHER)

## 2024-04-13 VITALS — Ht 70.0 in | Wt 285.0 lb

## 2024-04-13 DIAGNOSIS — E538 Deficiency of other specified B group vitamins: Secondary | ICD-10-CM | POA: Diagnosis not present

## 2024-04-13 MED ORDER — CYANOCOBALAMIN 1000 MCG/ML IJ SOLN
1000.0000 ug | Freq: Once | INTRAMUSCULAR | Status: AC
  Administered 2024-04-13: 1000 ug via INTRAMUSCULAR

## 2024-04-13 NOTE — Progress Notes (Signed)
 Patient received 1 cc IM Vitamin B12 injection today LUQ. Patient tolerated well.

## 2024-04-14 ENCOUNTER — Other Ambulatory Visit (HOSPITAL_COMMUNITY): Payer: Self-pay

## 2024-04-14 ENCOUNTER — Other Ambulatory Visit: Payer: Self-pay

## 2024-04-14 MED ORDER — CARVEDILOL 25 MG PO TABS
25.0000 mg | ORAL_TABLET | Freq: Two times a day (BID) | ORAL | 3 refills | Status: AC
  Filled 2024-04-14: qty 180, 90d supply, fill #0
  Filled 2024-07-12: qty 180, 90d supply, fill #1

## 2024-04-14 MED ORDER — SACUBITRIL-VALSARTAN 97-103 MG PO TABS
1.0000 | ORAL_TABLET | Freq: Two times a day (BID) | ORAL | 3 refills | Status: AC
  Filled 2024-04-14: qty 200, 100d supply, fill #0
  Filled 2024-07-12: qty 180, 90d supply, fill #1

## 2024-04-15 ENCOUNTER — Other Ambulatory Visit: Payer: Self-pay

## 2024-04-16 ENCOUNTER — Other Ambulatory Visit: Payer: Self-pay

## 2024-04-16 ENCOUNTER — Other Ambulatory Visit (HOSPITAL_COMMUNITY): Payer: Self-pay

## 2024-04-16 ENCOUNTER — Other Ambulatory Visit: Payer: Self-pay | Admitting: Internal Medicine

## 2024-04-16 MED ORDER — FAMOTIDINE 40 MG PO TABS
40.0000 mg | ORAL_TABLET | Freq: Every day | ORAL | 2 refills | Status: DC
  Filled 2024-04-16: qty 90, 90d supply, fill #0
  Filled 2024-04-16: qty 30, 30d supply, fill #0

## 2024-04-26 ENCOUNTER — Ambulatory Visit (INDEPENDENT_AMBULATORY_CARE_PROVIDER_SITE_OTHER): Admitting: Internal Medicine

## 2024-04-26 ENCOUNTER — Encounter: Payer: Self-pay | Admitting: Internal Medicine

## 2024-04-26 VITALS — BP 120/80 | HR 69 | Temp 97.8°F | Ht 70.0 in | Wt 281.0 lb

## 2024-04-26 DIAGNOSIS — K5901 Slow transit constipation: Secondary | ICD-10-CM

## 2024-04-26 DIAGNOSIS — K582 Mixed irritable bowel syndrome: Secondary | ICD-10-CM

## 2024-04-26 DIAGNOSIS — K219 Gastro-esophageal reflux disease without esophagitis: Secondary | ICD-10-CM

## 2024-04-26 NOTE — Progress Notes (Addendum)
 Patient Care Team: Perri Ronal PARAS, MD as PCP - General (Internal Medicine) Jeffrie Oneil BROCKS, MD as PCP - Cardiology (Cardiology)  Visit Date: 04/26/24  Subjective:    Patient ID: [name] [name] , Male   DOB: 18-Jun-1951, 73 y.o.    MRN: [id number]   73 y.o. Male presents today for Constipation. Patient has a past medical history of GE reflux, Anemia,Hypertension.   History of GERD, IBS treated with Famotidine  40 mg, Pantoprazole  40 mg twice daily, Dicyclomine  20 mg twice daily.       He was taking iron  pills and he stopped because it was giving him abdominal discomfort. He recently traveled to [place] with his wife and when he came back on Friday he was feeling abdominal pain, constipation, diarrhea and he didn't have an appetite. His last bowel movement was today. He says normally he will have a bowel movement every day but recently he will have one every other day.    History of Anemia treated with Ferrous sulfate  325 mg daily.    12/13/2019 Colonoscopy One 4 mm polyp in the sigmoid colon, removed with a cold snare. Resected and retrieved. One 2 mm polyp in the distal transverse colon, removed with a cold biopsy forceps. Resected and retrieved. Pathology found the polyps to be adenomas. A single ( solitary) ulcer at the ileocecal valve. Biopsied. Small with surrounding erythema. ? from prep vs meloxicam  The examined portion of the ileum was normal. Diverticulosis in the sigmoid colon. The examination was otherwise normal on direct and retroflexion views. Return in 7 years.   Past Medical History:  Diagnosis Date   Anxiety    Arthritis    Bradycardia    hx of  in 30's medications changed   Diabetes mellitus without complication (HCC)    DJD (degenerative joint disease) of knee    Left   Hx of adenomatous colonic polyps 12/2019   2 diminutive   Hypertension      Family History  Problem Relation Age of Onset   Stroke Mother    Hypertension Mother    Colon cancer Neg Hx     Stomach cancer Neg Hx    Rectal cancer Neg Hx    Pancreatic cancer Neg Hx    Esophageal cancer Neg Hx     Social History   Social History Narrative   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   He is married   No alcohol tobacco or drug use         Review of Systems  Gastrointestinal:  Positive for abdominal pain, constipation and diarrhea.        Objective:   Vitals: BP 120/80   Pulse 69   Temp 97.8 F (36.6 C)   Ht 5' 10 (1.778 m)   Wt 281 lb (127.5 kg)   SpO2 97%   BMI 40.32 kg/m    Physical Exam Exam conducted with a chaperone present (Araceli [place], CMA).  Abdominal:     General: There is distension.     Palpations: Abdomen is soft. There is no hepatomegaly, splenomegaly or mass.     Tenderness: There is no abdominal tenderness.       Results:    12/13/2019 Colonoscopy One 4 mm polyp in the sigmoid colon, removed with a cold snare. Resected and retrieved. One 2 mm polyp in the distal transverse colon, removed with a cold biopsy forceps. Resected and retrieved. Pathology found the polyps to be adenomas. A single ( solitary)  ulcer at the ileocecal valve. Biopsied. Small with surrounding erythema.  ? from prep vs meloxicam  The examined portion of the ileum was normal. Diverticulosis in the sigmoid colon. The examination was otherwise normal on direct and retroflexion views. Return in 7 years.   Labs:       Component Value Date/Time   NA 140 05/19/2023 0923   NA 139 01/16/2021 0822   K 4.2 05/19/2023 0923   CL 102 05/19/2023 0923   CO2 29 05/19/2023 0923   GLUCOSE 113 (H) 05/19/2023 0923   BUN 14 05/19/2023 0923   BUN 12 01/16/2021 0822   CREATININE 1.12 05/19/2023 0923   CALCIUM  9.7 05/19/2023 0923   PROT 7.7 11/25/2023 0919   ALBUMIN 3.2 (L) 09/06/2020 1038   AST 15 11/25/2023 0919   ALT 12 11/25/2023 0919   ALKPHOS 48 09/06/2020 1038   BILITOT 0.7 11/25/2023 0919   GFRNONAA >60 12/03/2021 2008   GFRNONAA 73 04/21/2020 0904   GFRAA  85 04/21/2020 0904     Lab Results  Component Value Date   WBC 4.1 05/19/2023   HGB 12.4 (L) 05/19/2023   HCT 38.5 05/19/2023   MCV 99.0 05/19/2023   PLT 274 05/19/2023    Lab Results  Component Value Date   CHOL 149 11/25/2023   HDL 57 11/25/2023   LDLCALC 76 11/25/2023   TRIG 79 11/25/2023   CHOLHDL 2.6 11/25/2023    Lab Results  Component Value Date   HGBA1C 6.2 (H) 11/25/2023     Lab Results  Component Value Date   TSH 0.564 09/06/2020     Lab Results  Component Value Date   PSA 0.58 05/19/2023   PSA 0.48 05/13/2022   PSA 0.54 05/01/2021     Assessment & Plan:   GERD- treated with Famotidine  40 mg, Pantoprazole  40 mg twice daily  Occasional irritable bowel issues  treated with Dicyclomine  20 mg twice daily.. This can contribute to slowing of gut motility. Consider taking just once a day.    Constipation due to slow transit:  He was taking iron  pills and he stopped because it was giving him abdominal discomfort. He recently traveled to see family by car and when he came back on Friday ,he was feeling abdominal pain, constipation, diarrhea and he didn't have an appetite. His last bowel movement was today. He says normally he will have a bowel movement every day but recently he will have one every other day.   Feels bloated and uncomfortable. Advised to take 2 Dulcolax tablets by mouth today and then a capful of Miralax  in 8 oz of water  every day.    Anemia: treated with ferrous sulfate  325 mg daily.     Discontinue Iron  supplement for now.   Hx of insomnia. Ambien  5 mg at bedtime refilled on October 22.  12/13/2019 Colonoscopy One 4 mm polyp in the sigmoid colon, removed with a cold snare. Resected and retrieved. One 2 mm polyp in the distal transverse colon, removed with a cold biopsy forceps. Resected and retrieved. Pathology found the polyps to be adenomas. A single ( solitary) ulcer at the ileocecal valve. Biopsied. Small with surrounding erythema.  ? from  prep vs meloxicam  The examined portion of the ileum was normal. Diverticulosis in the sigmoid colon. The examination was otherwise normal on direct and retroflexion views. Return in 7 years.   Has CPE Dec 2nd. Will have labs the day before.   I,Makayla C Reid,acting as a neurosurgeon for [employer],  MD.,have documented all relevant documentation on the behalf of Ronal JINNY Hailstone, MD,as directed by  Ronal JINNY Hailstone, MD while in the presence of Ronal JINNY Hailstone, MD.

## 2024-04-28 ENCOUNTER — Other Ambulatory Visit: Payer: Self-pay | Admitting: Internal Medicine

## 2024-04-28 ENCOUNTER — Other Ambulatory Visit (HOSPITAL_COMMUNITY): Payer: Self-pay

## 2024-04-28 ENCOUNTER — Other Ambulatory Visit: Payer: Self-pay

## 2024-04-28 MED ORDER — ZOLPIDEM TARTRATE 5 MG PO TABS
5.0000 mg | ORAL_TABLET | Freq: Every evening | ORAL | 1 refills | Status: AC | PRN
  Filled 2024-04-28: qty 90, 90d supply, fill #0
  Filled 2024-07-26: qty 90, 90d supply, fill #1

## 2024-04-29 ENCOUNTER — Other Ambulatory Visit: Payer: Self-pay

## 2024-05-05 NOTE — Patient Instructions (Signed)
 Discontinue iron  supplement for now. Continue PPI and famotidine  for GERD, Follow up at CPE in early December.

## 2024-05-13 ENCOUNTER — Other Ambulatory Visit: Payer: Self-pay | Admitting: Cardiology

## 2024-05-13 ENCOUNTER — Other Ambulatory Visit: Payer: Self-pay | Admitting: Internal Medicine

## 2024-05-13 ENCOUNTER — Other Ambulatory Visit: Payer: Self-pay

## 2024-05-13 ENCOUNTER — Other Ambulatory Visit (HOSPITAL_COMMUNITY): Payer: Self-pay

## 2024-05-13 MED ORDER — METFORMIN HCL ER 500 MG PO TB24
500.0000 mg | ORAL_TABLET | Freq: Two times a day (BID) | ORAL | 3 refills | Status: AC
  Filled 2024-05-13: qty 180, 90d supply, fill #0
  Filled 2024-08-11: qty 180, 90d supply, fill #1

## 2024-05-14 ENCOUNTER — Other Ambulatory Visit (HOSPITAL_COMMUNITY): Payer: Self-pay

## 2024-05-14 ENCOUNTER — Other Ambulatory Visit: Payer: Self-pay

## 2024-05-14 ENCOUNTER — Other Ambulatory Visit (HOSPITAL_BASED_OUTPATIENT_CLINIC_OR_DEPARTMENT_OTHER): Payer: Self-pay

## 2024-05-14 ENCOUNTER — Ambulatory Visit: Payer: Self-pay

## 2024-05-14 VITALS — BP 100/70 | HR 60 | Ht 70.0 in | Wt 281.0 lb

## 2024-05-14 DIAGNOSIS — E538 Deficiency of other specified B group vitamins: Secondary | ICD-10-CM | POA: Diagnosis not present

## 2024-05-14 MED ORDER — CYANOCOBALAMIN 1000 MCG/ML IJ SOLN
1000.0000 ug | Freq: Once | INTRAMUSCULAR | Status: AC
  Administered 2024-05-14: 1000 ug via INTRAMUSCULAR

## 2024-05-14 MED ORDER — AMLODIPINE BESYLATE 10 MG PO TABS
10.0000 mg | ORAL_TABLET | Freq: Every day | ORAL | 3 refills | Status: AC
  Filled 2024-05-14: qty 90, 90d supply, fill #0
  Filled 2024-08-11: qty 90, 90d supply, fill #1

## 2024-05-14 MED ORDER — PREGABALIN 50 MG PO CAPS
50.0000 mg | ORAL_CAPSULE | Freq: Two times a day (BID) | ORAL | 2 refills | Status: AC
  Filled 2024-05-14: qty 60, 30d supply, fill #0
  Filled 2024-06-16: qty 60, 30d supply, fill #1
  Filled 2024-07-12: qty 60, 30d supply, fill #2

## 2024-05-14 MED ORDER — ATORVASTATIN CALCIUM 10 MG PO TABS
10.0000 mg | ORAL_TABLET | Freq: Every day | ORAL | 3 refills | Status: AC
  Filled 2024-05-14: qty 90, 90d supply, fill #0
  Filled 2024-08-11: qty 90, 90d supply, fill #1

## 2024-05-14 MED ORDER — SPIRONOLACTONE 25 MG PO TABS
25.0000 mg | ORAL_TABLET | Freq: Two times a day (BID) | ORAL | 3 refills | Status: AC
  Filled 2024-05-14: qty 180, 90d supply, fill #0
  Filled 2024-08-11: qty 180, 90d supply, fill #1

## 2024-05-14 NOTE — Progress Notes (Signed)
 Patient received 1 cc IM Vitamin B12 injection today LUQ. Patient tolerated well.

## 2024-05-17 ENCOUNTER — Other Ambulatory Visit: Payer: Self-pay

## 2024-05-19 ENCOUNTER — Other Ambulatory Visit (HOSPITAL_COMMUNITY): Payer: Self-pay

## 2024-05-19 ENCOUNTER — Other Ambulatory Visit: Payer: Self-pay

## 2024-05-19 MED ORDER — PREDNISONE 5 MG (21) PO TBPK
ORAL_TABLET | ORAL | 0 refills | Status: DC
  Filled 2024-05-19 (×2): qty 21, 6d supply, fill #0

## 2024-05-27 NOTE — Progress Notes (Incomplete)
 Annual Wellness Visit   Patient Care Team: Baxley, Ronal PARAS, MD as PCP - General (Internal Medicine) Jeffrie Oneil BROCKS, MD as PCP - Cardiology (Cardiology)  Visit Date: 05/27/24   No chief complaint on file.  Subjective:  Patient: [name] [name], Male DOB: 06/20/1951, 73 y.o. MRN: [id number] There were no vitals filed for this visit. [name] [name] is a 73 y.o. Male who presents today for his Annual Wellness Visit. Patient has Erectile dysfunction; Hypertension; Type 2 diabetes mellitus with complication, without long-term current use of insulin  (HCC); Osteoarthritis of both knees; Obesity; GE reflux; History of gout; Low serum testosterone  level; PVC's (premature ventricular contractions); Hyperlipidemia associated with type 2 diabetes mellitus (HCC); BMI 40.0-44.9, adult (HCC); Abdominal aortic atherosclerosis; Chronic systolic heart failure (HCC); Primary osteoarthritis of left knee; and Primary osteoarthritis of right knee on their problem list.  History of Diabetes Mellitus, Type II treated with Metformin  500 mg daily.   History of Hyperlipidemia treated with atorvastatin  10 mg daily.   History of Anxiety, insomnia treated with alprazolam  0.25-0.5 mg morning and afternoon and 0.5 mg at bedtime, Zolpidem  5 mg at bedtime as needed.   History of hypertension treated with amlodipine  10 mg daily, carvedilol  25 mg twice daily, furosemide  40 mg daily. History of combined systolic and diastolic heart failure, PVCs, abdominal aortic atherosclerosis treated with sacubitril -valsartan  97-103 mg twice daily, Aldactone . Followed by cardiologist, Dr. Oneil Jeffrie. Ejection fraction on echo in April 2021 was 55 to 60%.  In 2020 ejection fraction was 30 to 35%.   History of GERD, IBS treated with famotidine  40 mg, pantoprazole  40 mg twice daily, dicyclomine  20 mg twice daily. He is passing a significant amount of gas. He is somewhat constipated, with BM's every 2-3 days. Believes he is eating enough  fiber sources like leafy greens.   Followed by Dr. Esmeralda Sharps.  Was seen in the emergency department May 2023 with chest pain.  MI was ruled out.  Dr. Sharps saw him in March 2023 and felt patient had nonobstructive/minimal atherosclerosis of the coronary arteries, left ventricular dysfunction, obesity.     Hospitalized with acute cholecystitis July 2017.  He had ERCP and laparoscopic cholecystectomy.   Labs ***/***/*** {Labs (Optional):31667}    12/13/2019 Colonoscopy One 4 mm polyp in the sigmoid colon. Resected and retrieved. One 2 mm polyp in the distal transverse colon. Resected and retrieved. Pathology found to be adenomas. A single ( solitary) ulcer at the ileocecal valve. Biopsied. Benign IC valve ulcer. Small with surrounding erythema. ? from prep vs meloxicam  The examined portion of the ileum was normal. Diverticulosis in the sigmoid colon. The examination was otherwise normal on direct and retroflexion views. Repeat in 7 years.   Health Maintenance  Topic Date Due   Diabetic kidney evaluation - eGFR measurement  05/18/2024   Medicare Annual Wellness (AWV)  05/21/2024   HEMOGLOBIN A1C  05/27/2024   OPHTHALMOLOGY EXAM  05/31/2024   Diabetic kidney evaluation - Urine ACR  11/24/2024   FOOT EXAM  11/26/2024   Colonoscopy  12/13/2026   DTaP/Tdap/Td (3 - Td or Tdap) 03/19/2032   Pneumococcal Vaccine: 50+ Years  Completed   Influenza Vaccine  Completed   Hepatitis C Screening  Completed   Zoster Vaccines- Shingrix   Completed   Meningococcal B Vaccine  Aged Out   COVID-19 Vaccine  Discontinued    {Man or Woman:32389}  Vaccine Counseling: Due for {Vaccines:32291::Influenza}; UTD on {Vaccines:32291::Influenza}  ROS Objective:  Vitals: body mass index is unknown  because there is no height or weight on file.There were no vitals filed for this visit. Physical Exam  Current Outpatient Medications  Medication Instructions   ALPRAZolam  (XANAX ) 0.5 MG tablet Take 1/2-1 tablet  (0.25-0.5 mg total) in the morning, 1/2-1 tablet (0.25-0.5 mg total) in the early afternoon and 1 tablet (0.5 mg total) at bedtime  for anxiety   amLODipine  (NORVASC ) 10 mg, Oral, Daily   aspirin  EC 81 mg, Oral, 2 times daily after meals   atorvastatin  (LIPITOR) 10 mg, Oral, Daily   carvedilol  (COREG ) 25 mg, Oral, 2 times daily   dicyclomine  (BENTYL ) 20 mg, Oral, 2 times daily   famotidine  (PEPCID ) 40 mg, Oral, Daily at bedtime   furosemide  (LASIX ) 40 mg, Oral, Daily   gabapentin  (NEURONTIN ) 300 MG capsule Take 2 capsules (600 mg total) by mouth daily as needed for pain.   gabapentin  (NEURONTIN ) 300 mg, Oral, 3 times daily   gabapentin  (NEURONTIN ) 300 mg, Oral, As directed   Iron , Ferrous Sulfate , 325 (65 Fe) MG TABS Take one tablet by mouth twice daily for 30 days, then take one tablet by mouth daily.   meloxicam  (MOBIC ) 15 mg, Oral, Daily   metFORMIN  (GLUCOPHAGE -XR) 500 mg, Oral, 2 times daily   pantoprazole  (PROTONIX ) 40 mg, Oral, 2 times daily before meals   predniSONE  (STERAPRED UNI-PAK 21 TAB) 5 MG (21) TBPK tablet Take as directed on pack for 6 days.   pregabalin  (LYRICA ) 50 mg, Oral, 2 times daily   sacubitril -valsartan  (ENTRESTO ) 97-103 MG 1 tablet, Oral, 2 times daily   spironolactone  (ALDACTONE ) 25 mg, Oral, 2 times daily   zolpidem  (AMBIEN ) 5 MG tablet Take 1 tablet (5 mg total) by mouth at bedtime as needed for sleep.   Past Medical History:  Diagnosis Date   Anxiety    Arthritis    Bradycardia    hx of  in 30's medications changed   Diabetes mellitus without complication (HCC)    DJD (degenerative joint disease) of knee    Left   Hx of adenomatous colonic polyps 12/2019   2 diminutive   Hypertension    Medical/Surgical History Narrative:  Allergic/Intolerant to: No Known Allergies *** - ***  *** - ***  *** - ***  *** - ***  *** - ***  *** - ***  *** - ***  *** - *** Other - Hx of: *** ; Surghx of: *** Past Surgical History:  Procedure Laterality Date    CARDIAC CATHETERIZATION  07/23/2018   CARPAL TUNNEL RELEASE Bilateral 2023   CHOLECYSTECTOMY N/A 02/04/2016   Procedure: LAPAROSCOPIC CHOLECYSTECTOMY;  Surgeon: Bernarda Ned, MD;  Location: [facility];  Service: General;  Laterality: N/A;   COLONOSCOPY  12/13/2019   ERCP N/A 02/02/2016   Procedure: ENDOSCOPIC RETROGRADE CHOLANGIOPANCREATOGRAPHY (ERCP);  Surgeon: Lupita FORBES Commander, MD;  Location: [facility];  Service: Endoscopy;  Laterality: N/A;   HERNIA REPAIR     RIGHT/LEFT HEART CATH AND CORONARY ANGIOGRAPHY N/A 07/23/2018   Procedure: RIGHT/LEFT HEART CATH AND CORONARY ANGIOGRAPHY;  Surgeon: Claudene Victory ORN, MD;  Location: [facility];  Service: Cardiovascular;  Laterality: N/A;   SPINE SURGERY     TOTAL KNEE ARTHROPLASTY Left 07/28/2018   Procedure: TOTAL KNEE ARTHROPLASTY;  Surgeon: Sheril Coy, MD;  Location: [facility];  Service: Orthopedics;  Laterality: Left;   TOTAL KNEE ARTHROPLASTY Right 09/05/2020   Procedure: RIGHT TOTAL KNEE ARTHROPLASTY;  Surgeon: Sheril Coy, MD;  Location: [facility];  Service: Orthopedics;  Laterality: Right;  Family History  Problem Relation Age of Onset   Stroke Mother    Hypertension Mother    Colon cancer Neg Hx    Stomach cancer Neg Hx    Rectal cancer Neg Hx    Pancreatic cancer Neg Hx    Esophageal cancer Neg Hx    Family History Narrative: {ELFamHX:31110} Social History   Social History Narrative   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   He is married   No alcohol tobacco or drug use      Most Recent Health Risks Assessment:   Most Recent Social Determinants of Health (Including Hx of Tobacco, Alcohol, and Drug Use) SDOH Screenings   Food Insecurity: No Food Insecurity (05/22/2023)  Housing: Low Risk  (05/22/2023)  Transportation Needs: No Transportation Needs (05/22/2023)  Utilities: Not At Risk (05/22/2023)  Alcohol Screen: Low Risk  (05/22/2023)  Depression (PHQ2-9): Low Risk  (05/22/2023)  Financial Resource  Strain: Low Risk  (05/22/2023)  Physical Activity: Sufficiently Active (05/22/2023)  Social Connections: Socially Integrated (05/22/2023)  Stress: No Stress Concern Present (05/22/2023)  Tobacco Use: Low Risk  (05/14/2024)  Health Literacy: Adequate Health Literacy (05/22/2023)   Social History   Tobacco Use   Smoking status: Never   Smokeless tobacco: Never  Vaping Use   Vaping status: Never Used  Substance Use Topics   Alcohol use: No   Drug use: No   Most Recent Functional Status Assessment:     No data to display         Most Recent Fall Risk Assessment:    05/22/2023   10:02 AM  Fall Risk   Falls in the past year? 0  Number falls in past yr: 0  Injury with Fall? 0  Risk for fall due to : No Fall Risks  Follow up Falls evaluation completed;Education provided;Falls prevention discussed   Most Recent Anxiety/Depression Screenings:    05/22/2023   10:02 AM 11/12/2022   10:36 AM  PHQ 2/9 Scores  PHQ - 2 Score 0 0       No data to display         Most Recent Cognitive Screening:    05/22/2023   10:02 AM  6CIT Screen  What Year? 0 points  What month? 0 points  What time? 0 points  Count back from 20 0 points  Months in reverse 0 points  Repeat phrase 0 points  Total Score 0 points   Most Recent Vision/Hearing Screenings:No results found. Results:  Studies Obtained And Personally Reviewed By Me: Diabetic Foot Exam - Simple   No data filed     {Imaging, colonoscopy, mammogram, bone density scan, echocardiogram, heart cath, stress test, CT calcium  score, etc.:32292}  Labs:  CBC w/ Differential Lab Results  Component Value Date   WBC 4.1 05/19/2023   RBC 3.89 (L) 05/19/2023   HGB 12.4 (L) 05/19/2023   HCT 38.5 05/19/2023   PLT 274 05/19/2023   MCV 99.0 05/19/2023   MCH 31.9 05/19/2023   MCHC 32.2 05/19/2023   RDW 11.8 05/19/2023   MPV 10.0 05/19/2023   LYMPHSABS 1,721 05/13/2022   MONOABS 0.8 09/06/2020   BASOSABS 49 05/19/2023     Comprehensive Metabolic Panel Lab Results  Component Value Date   NA 140 05/19/2023   K 4.2 05/19/2023   CL 102 05/19/2023   CO2 29 05/19/2023   GLUCOSE 113 (H) 05/19/2023   BUN 14 05/19/2023   CREATININE 1.12 05/19/2023   CALCIUM  9.7 05/19/2023  PROT 7.7 11/25/2023   ALBUMIN 3.2 (L) 09/06/2020   AST 15 11/25/2023   ALT 12 11/25/2023   ALKPHOS 48 09/06/2020   BILITOT 0.7 11/25/2023   EGFR 71 05/19/2023   GFRNONAA >60 12/03/2021   Lipid Panel  Lab Results  Component Value Date   CHOL 149 11/25/2023   HDL 57 11/25/2023   LDLCALC 76 11/25/2023   TRIG 79 11/25/2023   A1c Lab Results  Component Value Date   HGBA1C 6.2 (H) 11/25/2023    TSH Lab Results  Component Value Date   TSH 0.564 09/06/2020   PSA{PSA (Optional):32132} No results found for any visits on 06/08/24. Assessment & Plan:  No orders of the defined types were placed in this encounter.  No orders of the defined types were placed in this encounter.  Other Labs Reviewed today:    No follow-ups on file.   Annual Wellness Visit done today including the all of the following: Reviewed patient's Family Medical History Reviewed patient's SDOH and reviewed tobacco, alcohol, and drug use.  Reviewed and updated list of patient's medical providers Assessment of cognitive impairment was done Assessed patient's functional ability Established a written schedule for health screening services Health Risk Assessent Completed and Reviewed  Discussed health benefits of physical activity, and encouraged him to engage in regular exercise appropriate for his age and condition.   I,Makayla C Reid,acting as a scribe for Ronal JINNY Hailstone, MD.,have documented all relevant documentation on the behalf of Ronal JINNY Hailstone, MD,as directed by  Ronal JINNY Hailstone, MD while in the presence of Ronal JINNY Hailstone, MD.  I, Ronal JINNY Hailstone, MD, have reviewed all documentation for and agree with the above Annual Wellness Visit documentation.  Ronal JINNY Hailstone, MD Internal Medicine 06/08/2024

## 2024-06-07 ENCOUNTER — Other Ambulatory Visit: Payer: Self-pay

## 2024-06-07 DIAGNOSIS — E1169 Type 2 diabetes mellitus with other specified complication: Secondary | ICD-10-CM

## 2024-06-07 DIAGNOSIS — E118 Type 2 diabetes mellitus with unspecified complications: Secondary | ICD-10-CM

## 2024-06-07 DIAGNOSIS — M792 Neuralgia and neuritis, unspecified: Secondary | ICD-10-CM

## 2024-06-07 DIAGNOSIS — K582 Mixed irritable bowel syndrome: Secondary | ICD-10-CM

## 2024-06-07 DIAGNOSIS — D649 Anemia, unspecified: Secondary | ICD-10-CM

## 2024-06-07 DIAGNOSIS — Z0000 Encounter for general adult medical examination without abnormal findings: Secondary | ICD-10-CM

## 2024-06-07 DIAGNOSIS — E7800 Pure hypercholesterolemia, unspecified: Secondary | ICD-10-CM

## 2024-06-07 DIAGNOSIS — I10 Essential (primary) hypertension: Secondary | ICD-10-CM

## 2024-06-07 DIAGNOSIS — E538 Deficiency of other specified B group vitamins: Secondary | ICD-10-CM

## 2024-06-08 ENCOUNTER — Ambulatory Visit: Payer: Self-pay | Admitting: Internal Medicine

## 2024-06-08 LAB — CBC WITH DIFFERENTIAL/PLATELET
Absolute Lymphocytes: 1583 {cells}/uL (ref 850–3900)
Absolute Monocytes: 484 {cells}/uL (ref 200–950)
Basophils Absolute: 39 {cells}/uL (ref 0–200)
Basophils Relative: 1 %
Eosinophils Absolute: 20 {cells}/uL (ref 15–500)
Eosinophils Relative: 0.5 %
HCT: 37.7 % — ABNORMAL LOW (ref 39.4–51.1)
Hemoglobin: 12.2 g/dL — ABNORMAL LOW (ref 13.2–17.1)
MCH: 31.4 pg (ref 27.0–33.0)
MCHC: 32.4 g/dL (ref 31.6–35.4)
MCV: 97.2 fL (ref 81.4–101.7)
MPV: 10.2 fL (ref 7.5–12.5)
Monocytes Relative: 12.4 %
Neutro Abs: 1775 {cells}/uL (ref 1500–7800)
Neutrophils Relative %: 45.5 %
Platelets: 242 Thousand/uL (ref 140–400)
RBC: 3.88 Million/uL — ABNORMAL LOW (ref 4.20–5.80)
RDW: 12.3 % (ref 11.0–15.0)
Total Lymphocyte: 40.6 %
WBC: 3.9 Thousand/uL (ref 3.8–10.8)

## 2024-06-08 LAB — HEMOGLOBIN A1C
Hgb A1c MFr Bld: 6.3 % — ABNORMAL HIGH (ref <5.7)
Mean Plasma Glucose: 134 mg/dL
eAG (mmol/L): 7.4 mmol/L

## 2024-06-08 LAB — MICROALBUMIN / CREATININE URINE RATIO
Creatinine, Urine: 129 mg/dL (ref 20–320)
Microalb Creat Ratio: 4 mg/g{creat} (ref <30)
Microalb, Ur: 0.5 mg/dL

## 2024-06-08 LAB — COMPREHENSIVE METABOLIC PANEL WITH GFR
AG Ratio: 1.3 (calc) (ref 1.0–2.5)
ALT: 10 U/L (ref 9–46)
AST: 11 U/L (ref 10–35)
Albumin: 4.1 g/dL (ref 3.6–5.1)
Alkaline phosphatase (APISO): 62 U/L (ref 35–144)
BUN/Creatinine Ratio: 17 (calc) (ref 6–22)
BUN: 28 mg/dL — ABNORMAL HIGH (ref 7–25)
CO2: 26 mmol/L (ref 20–32)
Calcium: 9.3 mg/dL (ref 8.6–10.3)
Chloride: 103 mmol/L (ref 98–110)
Creat: 1.62 mg/dL — ABNORMAL HIGH (ref 0.70–1.28)
Globulin: 3.2 g/dL (ref 1.9–3.7)
Glucose, Bld: 119 mg/dL — ABNORMAL HIGH (ref 65–99)
Potassium: 4.8 mmol/L (ref 3.5–5.3)
Sodium: 137 mmol/L (ref 135–146)
Total Bilirubin: 1 mg/dL (ref 0.2–1.2)
Total Protein: 7.3 g/dL (ref 6.1–8.1)
eGFR: 45 mL/min/1.73m2 — ABNORMAL LOW (ref >=60)

## 2024-06-08 LAB — LIPID PANEL
Cholesterol: 133 mg/dL (ref <200)
HDL: 51 mg/dL (ref >=40)
LDL Cholesterol (Calc): 66 mg/dL
Non-HDL Cholesterol (Calc): 82 mg/dL (ref <130)
Total CHOL/HDL Ratio: 2.6 (calc) (ref <5.0)
Triglycerides: 79 mg/dL (ref <150)

## 2024-06-08 LAB — PSA: PSA: 0.89 ng/mL (ref <=4.00)

## 2024-06-09 ENCOUNTER — Other Ambulatory Visit (HOSPITAL_COMMUNITY): Payer: Self-pay

## 2024-06-09 MED ORDER — CELECOXIB 200 MG PO CAPS
200.0000 mg | ORAL_CAPSULE | Freq: Every day | ORAL | 1 refills | Status: DC | PRN
  Filled 2024-06-09: qty 30, 30d supply, fill #0

## 2024-06-11 ENCOUNTER — Ambulatory Visit: Admitting: Internal Medicine

## 2024-06-11 ENCOUNTER — Encounter: Payer: Self-pay | Admitting: Internal Medicine

## 2024-06-11 ENCOUNTER — Other Ambulatory Visit (HOSPITAL_COMMUNITY): Payer: Self-pay

## 2024-06-11 VITALS — BP 120/70 | HR 66 | Ht 70.0 in | Wt 276.0 lb

## 2024-06-11 DIAGNOSIS — E785 Hyperlipidemia, unspecified: Secondary | ICD-10-CM | POA: Diagnosis not present

## 2024-06-11 DIAGNOSIS — F5105 Insomnia due to other mental disorder: Secondary | ICD-10-CM | POA: Diagnosis not present

## 2024-06-11 DIAGNOSIS — K219 Gastro-esophageal reflux disease without esophagitis: Secondary | ICD-10-CM | POA: Diagnosis not present

## 2024-06-11 DIAGNOSIS — F419 Anxiety disorder, unspecified: Secondary | ICD-10-CM | POA: Diagnosis not present

## 2024-06-11 DIAGNOSIS — Z0000 Encounter for general adult medical examination without abnormal findings: Secondary | ICD-10-CM

## 2024-06-11 DIAGNOSIS — I5042 Chronic combined systolic (congestive) and diastolic (congestive) heart failure: Secondary | ICD-10-CM

## 2024-06-11 DIAGNOSIS — E538 Deficiency of other specified B group vitamins: Secondary | ICD-10-CM

## 2024-06-11 DIAGNOSIS — R7989 Other specified abnormal findings of blood chemistry: Secondary | ICD-10-CM

## 2024-06-11 DIAGNOSIS — I10 Essential (primary) hypertension: Secondary | ICD-10-CM | POA: Diagnosis not present

## 2024-06-11 DIAGNOSIS — M7918 Myalgia, other site: Secondary | ICD-10-CM | POA: Diagnosis not present

## 2024-06-11 DIAGNOSIS — R799 Abnormal finding of blood chemistry, unspecified: Secondary | ICD-10-CM

## 2024-06-11 LAB — POCT URINALYSIS DIP (CLINITEK)
Bilirubin, UA: NEGATIVE
Blood, UA: NEGATIVE
Glucose, UA: NEGATIVE mg/dL
Ketones, POC UA: NEGATIVE mg/dL
Leukocytes, UA: NEGATIVE
Nitrite, UA: NEGATIVE
POC PROTEIN,UA: NEGATIVE
Spec Grav, UA: 1.02 (ref 1.010–1.025)
Urobilinogen, UA: 0.2 U/dL
pH, UA: 6 (ref 5.0–8.0)

## 2024-06-11 LAB — BUN/CREATININE RATIO
BUN/Creatinine Ratio: 20 (calc) (ref 6–22)
BUN: 39 mg/dL — ABNORMAL HIGH (ref 7–25)
Creat: 1.93 mg/dL — ABNORMAL HIGH (ref 0.70–1.28)
eGFR: 37 mL/min/1.73m2 — ABNORMAL LOW (ref >=60)

## 2024-06-11 MED ORDER — CYANOCOBALAMIN 1000 MCG/ML IJ SOLN
1000.0000 ug | Freq: Once | INTRAMUSCULAR | Status: AC
  Administered 2024-06-11: 1000 ug via INTRAMUSCULAR

## 2024-06-11 MED ORDER — HYDROCODONE-ACETAMINOPHEN 10-325 MG PO TABS
1.0000 | ORAL_TABLET | Freq: Three times a day (TID) | ORAL | 0 refills | Status: DC | PRN
  Filled 2024-06-11 – 2024-06-12 (×2): qty 15, 5d supply, fill #0

## 2024-06-11 NOTE — Progress Notes (Signed)
 Chief Complaint  Patient presents with   Annual Exam   Medicare Wellness     Subjective:   [name] [name] is a 73 y.o. male who presents for a Medicare Annual Wellness Visit.  Visit info / Clinical Intake: Medicare Wellness Visit Type:: Subsequent Annual Wellness Visit Persons participating in visit and providing information:: patient Medicare Wellness Visit Mode:: In-person (required for WTM) Interpreter Needed?: No Pre-visit prep was completed: yes AWV questionnaire completed by patient prior to visit?: no Living arrangements:: lives with spouse/significant other Patient's Overall Health Status Rating: very good Typical amount of pain: none Does pain affect daily life?: no Are you currently prescribed opioids?: no  Dietary Habits and Nutritional Risks How many meals a day?: 3 Eats fruit and vegetables daily?: yes Most meals are obtained by: preparing own meals In the last 2 weeks, have you had any of the following?: none Diabetic:: (!) yes Any non-healing wounds?: no How often do you check your BS?: 1 Would you like to be referred to a Nutritionist or for Diabetic Management? : no  Functional Status Activities of Daily Living (to include ambulation/medication): Independent Ambulation: Independent Medication Administration: Independent Home Management (perform basic housework or laundry): Independent Manage your own finances?: yes Primary transportation is: driving Concerns about vision?: no *vision screening is required for WTM* Concerns about hearing?: no  Fall Screening Falls in the past year?: 0 Number of falls in past year: 0 Was there an injury with Fall?: 0 Fall Risk Category Calculator: 0 Patient Fall Risk Level: Low Fall Risk  Fall Risk Patient at Risk for Falls Due to: No Fall Risks Fall risk Follow up: Falls prevention discussed; Education provided; Falls evaluation completed  Home and Transportation Safety: All rugs have non-skid backing?:  yes All stairs or steps have railings?: (!) no Grab bars in the bathtub or shower?: (!) no Have non-skid surface in bathtub or shower?: yes Good home lighting?: yes Regular seat belt use?: yes Hospital stays in the last year:: no  Cognitive Assessment Difficulty concentrating, remembering, or making decisions? : yes Will 6CIT or Mini Cog be Completed: yes What year is it?: 0 points What month is it?: 0 points About what time is it?: 0 points Count backwards from 20 to 1: 0 points Say the months of the year in reverse: 0 points Repeat the address phrase from earlier: 0 points 6 CIT Score: 0 points  Advance Directives (For Healthcare) Does Patient Have a Medical Advance Directive?: Yes Does patient want to make changes to medical advance directive?: No - Patient declined Type of Advance Directive: Living will; Healthcare Power of Attorney Copy of Healthcare Power of Attorney in Chart?: No - copy requested Copy of Living Will in Chart?: No - copy requested  Reviewed/Updated  Reviewed/Updated: Reviewed All (Medical, Surgical, Family, Medications, Allergies, Care Teams, Patient Goals)    Allergies (verified) Patient has no known allergies.   Current Medications (verified) Outpatient Encounter Medications as of 06/11/2024  Medication Sig   ALPRAZolam  (XANAX ) 0.5 MG tablet Take 1/2-1 tablet (0.25-0.5 mg total) in the morning, 1/2-1 tablet (0.25-0.5 mg total) in the early afternoon and 1 tablet (0.5 mg total) at bedtime  for anxiety   amLODipine  (NORVASC ) 10 MG tablet Take 1 tablet (10 mg total) by mouth daily.   aspirin  EC 81 MG tablet Take 1 tablet (81 mg total) by mouth 2 (two) times daily after a meal.   atorvastatin  (LIPITOR) 10 MG tablet Take 1 tablet (10 mg total) by mouth  daily.   carvedilol  (COREG ) 25 MG tablet Take 1 tablet (25 mg total) by mouth 2 (two) times daily.   celecoxib  (CELEBREX ) 200 MG capsule Take 1 capsule (200 mg total) by mouth daily with food as needed for  pain.   dicyclomine  (BENTYL ) 20 MG tablet Take 1 tablet (20 mg total) by mouth 2 (two) times daily.   famotidine  (PEPCID ) 40 MG tablet Take 1 tablet (40 mg total) by mouth at bedtime.   furosemide  (LASIX ) 40 MG tablet Take 1 tablet (40 mg total) by mouth daily.   gabapentin  (NEURONTIN ) 300 MG capsule Take 1 capsule (300 mg total) by mouth as directed.   HYDROcodone -acetaminophen  (NORCO) 10-325 MG tablet Take 1 tablet by mouth every 8 (eight) hours as needed for up to 5 days.   meloxicam  (MOBIC ) 15 MG tablet Take 1 tablet (15 mg total) by mouth daily. (Patient taking differently: Take 15 mg by mouth daily as needed.)   metFORMIN  (GLUCOPHAGE -XR) 500 MG 24 hr tablet Take 1 tablet (500 mg total) by mouth 2 (two) times daily.   pantoprazole  (PROTONIX ) 40 MG tablet Take 1 tablet (40 mg total) by mouth 2 (two) times daily before a meal.   pregabalin  (LYRICA ) 50 MG capsule Take 1 capsule (50 mg total) by mouth in the morning and at bedtime.   sacubitril -valsartan  (ENTRESTO ) 97-103 MG Take 1 tablet by mouth 2 (two) times daily.   spironolactone  (ALDACTONE ) 25 MG tablet Take 1 tablet (25 mg total) by mouth 2 (two) times daily.   zolpidem  (AMBIEN ) 5 MG tablet Take 1 tablet (5 mg total) by mouth at bedtime as needed for sleep.   [DISCONTINUED] gabapentin  (NEURONTIN ) 300 MG capsule Take 1 capsule (300 mg total) by mouth 3 (three) times daily. (Patient not taking: Reported on 04/26/2024)   [DISCONTINUED] gabapentin  (NEURONTIN ) 300 MG capsule Take 2 capsules (600 mg total) by mouth daily as needed for pain. (Patient not taking: Reported on 04/26/2024)   [DISCONTINUED] Iron , Ferrous Sulfate , 325 (65 Fe) MG TABS Take one tablet by mouth twice daily for 30 days, then take one tablet by mouth daily. (Patient not taking: Reported on 04/26/2024)   [DISCONTINUED] predniSONE  (STERAPRED UNI-PAK 21 TAB) 5 MG (21) TBPK tablet Take as directed on pack for 6 days.   [EXPIRED] cyanocobalamin  (VITAMIN B12) injection 1,000 mcg     No facility-administered encounter medications on file as of 06/11/2024.    History: Past Medical History:  Diagnosis Date   Anxiety    Arthritis    Bradycardia    hx of  in 30's medications changed   Diabetes mellitus without complication (HCC)    DJD (degenerative joint disease) of knee    Left   Hx of adenomatous colonic polyps 12/2019   2 diminutive   Hypertension    Past Surgical History:  Procedure Laterality Date   CARDIAC CATHETERIZATION  07/23/2018   CARPAL TUNNEL RELEASE Bilateral 2023   CHOLECYSTECTOMY N/A 02/04/2016   Procedure: LAPAROSCOPIC CHOLECYSTECTOMY;  Surgeon: Bernarda Ned, MD;  Location: [facility];  Service: General;  Laterality: N/A;   COLONOSCOPY  12/13/2019   ERCP N/A 02/02/2016   Procedure: ENDOSCOPIC RETROGRADE CHOLANGIOPANCREATOGRAPHY (ERCP);  Surgeon: Lupita FORBES Commander, MD;  Location: [facility];  Service: Endoscopy;  Laterality: N/A;   HERNIA REPAIR     RIGHT/LEFT HEART CATH AND CORONARY ANGIOGRAPHY N/A 07/23/2018   Procedure: RIGHT/LEFT HEART CATH AND CORONARY ANGIOGRAPHY;  Surgeon: Claudene Victory ORN, MD;  Location: [facility];  Service: Cardiovascular;  Laterality:  N/A;   SPINE SURGERY     TOTAL KNEE ARTHROPLASTY Left 07/28/2018   Procedure: TOTAL KNEE ARTHROPLASTY;  Surgeon: Sheril Coy, MD;  Location: [facility];  Service: Orthopedics;  Laterality: Left;   TOTAL KNEE ARTHROPLASTY Right 09/05/2020   Procedure: RIGHT TOTAL KNEE ARTHROPLASTY;  Surgeon: Sheril Coy, MD;  Location: [facility];  Service: Orthopedics;  Laterality: Right;   Family History  Problem Relation Age of Onset   Stroke Mother    Hypertension Mother    Colon cancer Neg Hx    Stomach cancer Neg Hx    Rectal cancer Neg Hx    Pancreatic cancer Neg Hx    Esophageal cancer Neg Hx    Social History   Occupational History   Not on file  Tobacco Use   Smoking status: Never   Smokeless tobacco: Never  Vaping Use   Vaping status: Never Used  Substance and Sexual  Activity   Alcohol use: No   Drug use: No   Sexual activity: Yes   Tobacco Counseling Counseling given: No  SDOH Screenings   Food Insecurity: No Food Insecurity (06/11/2024)  Housing: Low Risk  (06/11/2024)  Transportation Needs: No Transportation Needs (06/11/2024)  Utilities: Not At Risk (06/11/2024)  Alcohol Screen: Low Risk  (06/11/2024)  Depression (PHQ2-9): Low Risk  (06/11/2024)  Financial Resource Strain: Low Risk  (06/11/2024)  Physical Activity: Insufficiently Active (06/11/2024)  Social Connections: Socially Integrated (06/11/2024)  Stress: No Stress Concern Present (06/11/2024)  Tobacco Use: Low Risk  (06/11/2024)  Health Literacy: Adequate Health Literacy (06/11/2024)   See flowsheets for full screening details  Depression Screen PHQ 2 & 9 Depression Scale- Over the past 2 weeks, how often have you been bothered by any of the following problems? Little interest or pleasure in doing things: 0 Feeling down, depressed, or hopeless (PHQ Adolescent also includes...irritable): 0 PHQ-2 Total Score: 0     Goals Addressed               This Visit's Progress     Activity and Exercise Increased (pt-stated)        Evidence-based guidance:  Review current exercise levels.  Assess patient perspective on exercise or activity level, barriers to increasing activity, motivation and readiness for change.  Recommend or set healthy exercise goal based on individual tolerance.  Encourage small steps toward making change in amount of exercise or activity.  Urge reduction of sedentary activities or screen time.  Promote group activities within the community or with family or support person.  Consider referral to rehabiliation therapist for assessment and exercise/activity plan.   Notes:              Objective:    Today's Vitals   06/11/24 1457  BP: 120/70  Pulse: 66  SpO2: 98%  Weight: 276 lb (125.2 kg)  Height: 5' 10 (1.778 m)  PainSc: 0-No pain   Body mass index is  39.6 kg/m.  Hearing/Vision screen Vision Screening - Comments:: Patient states he is up to date with his eye exam Last eye exam done 12/2024 at [facility].  Immunizations and Health Maintenance Health Maintenance  Topic Date Due   Medicare Annual Wellness (AWV)  05/21/2024   OPHTHALMOLOGY EXAM  05/31/2024   FOOT EXAM  11/26/2024   HEMOGLOBIN A1C  12/06/2024   Diabetic kidney evaluation - eGFR measurement  06/07/2025   Diabetic kidney evaluation - Urine ACR  06/07/2025   Colonoscopy  12/13/2026   DTaP/Tdap/Td (3 - Td or Tdap)  03/19/2032   Pneumococcal Vaccine: 50+ Years  Completed   Influenza Vaccine  Completed   Hepatitis C Screening  Completed   Zoster Vaccines- Shingrix   Completed   Meningococcal B Vaccine  Aged Out   COVID-19 Vaccine  Discontinued        Assessment/Plan:  This is a routine wellness examination for [place].  Patient Care Team: Perri Ronal PARAS, MD as PCP - General (Internal Medicine) Jeffrie Oneil BROCKS, MD as PCP - Cardiology (Cardiology)  I have personally reviewed and noted the following in the patients chart:   Medical and social history Use of alcohol, tobacco or illicit drugs  Current medications and supplements including opioid prescriptions. Functional ability and status Nutritional status Physical activity Advanced directives List of other physicians Hospitalizations, surgeries, and ER visits in previous 12 months Vitals Screenings to include cognitive, depression, and falls Referrals and appointments  Orders Placed This Encounter  Procedures   BUN/Creatinine Ratio   POCT URINALYSIS DIP (CLINITEK)   In addition, I have reviewed and discussed with patient certain preventive protocols, quality metrics, and best practice recommendations. A written personalized care plan for preventive services as well as general preventive health recommendations were provided to patient.   Araceli Zelda, CMA   06/11/2024   RTC in one year for  medicare wellness visit.  After Visit Summary: (In Person-Printed) AVS printed and given to the patient  Nurse Notes: none

## 2024-06-11 NOTE — Patient Instructions (Signed)
 Mr. Losasso,  Thank you for taking the time for your Medicare Wellness Visit. I appreciate your continued commitment to your health goals. Please review the care plan we discussed, and feel free to reach out if I can assist you further.  Please note that Annual Wellness Visits do not include a physical exam. Some assessments may be limited, especially if the visit was conducted virtually. If needed, we may recommend an in-person follow-up with your provider.  Ongoing Care Seeing your primary care provider every 3 to 6 months helps us  monitor your health and provide consistent, personalized care.   Referrals If a referral was made during today's visit and you haven't received any updates within two weeks, please contact the referred provider directly to check on the status.  Recommended Screenings:  Health Maintenance  Topic Date Due   Medicare Annual Wellness Visit  05/21/2024   Eye exam for diabetics  05/31/2024   Complete foot exam   11/26/2024   Hemoglobin A1C  12/06/2024   Yearly kidney function blood test for diabetes  06/07/2025   Yearly kidney health urinalysis for diabetes  06/07/2025   Colon Cancer Screening  12/13/2026   DTaP/Tdap/Td vaccine (3 - Td or Tdap) 03/19/2032   Pneumococcal Vaccine for age over 28  Completed   Flu Shot  Completed   Hepatitis C Screening  Completed   Zoster (Shingles) Vaccine  Completed   Meningitis B Vaccine  Aged Out   COVID-19 Vaccine  Discontinued       06/11/2024    2:45 PM  Advanced Directives  Does Patient Have a Medical Advance Directive? Yes  Type of Advance Directive Living will;Healthcare Power of Attorney  Does patient want to make changes to medical advance directive? No - Patient declined  Copy of Healthcare Power of Attorney in Chart? No - copy requested    Vision: Annual vision screenings are recommended for early detection of glaucoma, cataracts, and diabetic retinopathy. These exams can also reveal signs of chronic  conditions such as diabetes and high blood pressure.  Dental: Annual dental screenings help detect early signs of oral cancer, gum disease, and other conditions linked to overall health, including heart disease and diabetes.  Please see the attached documents for additional preventive care recommendations.     Next appointment: Follow up in one year for your annual wellness visit.   Preventive Care 9 Years and Older, Male Preventive care refers to lifestyle choices and visits with your health care provider that can promote health and wellness. What does preventive care include? A yearly physical exam. This is also called an annual well check. Dental exams once or twice a year. Routine eye exams. Ask your health care provider how often you should have your eyes checked. Personal lifestyle choices, including: Daily care of your teeth and gums. Regular physical activity. Eating a healthy diet. Avoiding tobacco and drug use. Limiting alcohol use. Practicing safe sex. Taking low doses of aspirin  every day. Taking vitamin and mineral supplements as recommended by your health care provider. What happens during an annual well check? The services and screenings done by your health care provider during your annual well check will depend on your age, overall health, lifestyle risk factors, and family history of disease. Counseling  Your health care provider may ask you questions about your: Alcohol use. Tobacco use. Drug use. Emotional well-being. Home and relationship well-being. Sexual activity. Eating habits. History of falls. Memory and ability to understand (cognition). Work and work astronomer. Screening  You may have the following tests or measurements: Height, weight, and BMI. Blood pressure. Lipid and cholesterol levels. These may be checked every 5 years, or more frequently if you are over 58 years old. Skin check. Lung cancer screening. You may have this screening every  year starting at age 16 if you have a 30-pack-year history of smoking and currently smoke or have quit within the past 15 years. Fecal occult blood test (FOBT) of the stool. You may have this test every year starting at age 22. Flexible sigmoidoscopy or colonoscopy. You may have a sigmoidoscopy every 5 years or a colonoscopy every 10 years starting at age 22. Prostate cancer screening. Recommendations will vary depending on your family history and other risks. Hepatitis C blood test. Hepatitis B blood test. Sexually transmitted disease (STD) testing. Diabetes screening. This is done by checking your blood sugar (glucose) after you have not eaten for a while (fasting). You may have this done every 1-3 years. Abdominal aortic aneurysm (AAA) screening. You may need this if you are a current or former smoker. Osteoporosis. You may be screened starting at age 63 if you are at high risk. Talk with your health care provider about your test results, treatment options, and if necessary, the need for more tests. Vaccines  Your health care provider may recommend certain vaccines, such as: Influenza vaccine. This is recommended every year. Tetanus, diphtheria, and acellular pertussis (Tdap, Td) vaccine. You may need a Td booster every 10 years. Zoster vaccine. You may need this after age 32. Pneumococcal 13-valent conjugate (PCV13) vaccine. One dose is recommended after age 71. Pneumococcal polysaccharide (PPSV23) vaccine. One dose is recommended after age 28. Talk to your health care provider about which screenings and vaccines you need and how often you need them. This information is not intended to replace advice given to you by your health care provider. Make sure you discuss any questions you have with your health care provider. Document Released: 07/21/2015 Document Revised: 03/13/2016 Document Reviewed: 04/25/2015 Elsevier Interactive Patient Education  2017 [employer].  Fall Prevention in the  Home Falls can cause injuries. They can happen to people of all ages. There are many things you can do to make your home safe and to help prevent falls. What can I do on the outside of my home? Regularly fix the edges of walkways and driveways and fix any cracks. Remove anything that might make you trip as you walk through a door, such as a raised step or threshold. Trim any bushes or trees on the path to your home. Use bright outdoor lighting. Clear any walking paths of anything that might make someone trip, such as rocks or tools. Regularly check to see if handrails are loose or broken. Make sure that both sides of any steps have handrails. Any raised decks and porches should have guardrails on the edges. Have any leaves, snow, or ice cleared regularly. Use sand or salt on walking paths during winter. Clean up any spills in your garage right away. This includes oil or grease spills. What can I do in the bathroom? Use night lights. Install grab bars by the toilet and in the tub and shower. Do not use towel bars as grab bars. Use non-skid mats or decals in the tub or shower. If you need to sit down in the shower, use a plastic, non-slip stool. Keep the floor dry. Clean up any water  that spills on the floor as soon as it happens. Remove soap buildup in  the tub or shower regularly. Attach bath mats securely with double-sided non-slip rug tape. Do not have throw rugs and other things on the floor that can make you trip. What can I do in the bedroom? Use night lights. Make sure that you have a light by your bed that is easy to reach. Do not use any sheets or blankets that are too big for your bed. They should not hang down onto the floor. Have a firm chair that has side arms. You can use this for support while you get dressed. Do not have throw rugs and other things on the floor that can make you trip. What can I do in the kitchen? Clean up any spills right away. Avoid walking on wet  floors. Keep items that you use a lot in easy-to-reach places. If you need to reach something above you, use a strong step stool that has a grab bar. Keep electrical cords out of the way. Do not use floor polish or wax that makes floors slippery. If you must use wax, use non-skid floor wax. Do not have throw rugs and other things on the floor that can make you trip. What can I do with my stairs? Do not leave any items on the stairs. Make sure that there are handrails on both sides of the stairs and use them. Fix handrails that are broken or loose. Make sure that handrails are as long as the stairways. Check any carpeting to make sure that it is firmly attached to the stairs. Fix any carpet that is loose or worn. Avoid having throw rugs at the top or bottom of the stairs. If you do have throw rugs, attach them to the floor with carpet tape. Make sure that you have a light switch at the top of the stairs and the bottom of the stairs. If you do not have them, ask someone to add them for you. What else can I do to help prevent falls? Wear shoes that: Do not have high heels. Have rubber bottoms. Are comfortable and fit you well. Are closed at the toe. Do not wear sandals. If you use a stepladder: Make sure that it is fully opened. Do not climb a closed stepladder. Make sure that both sides of the stepladder are locked into place. Ask someone to hold it for you, if possible. Clearly mark and make sure that you can see: Any grab bars or handrails. First and last steps. Where the edge of each step is. Use tools that help you move around (mobility aids) if they are needed. These include: Canes. Walkers. Scooters. Crutches. Turn on the lights when you go into a dark area. Replace any light bulbs as soon as they burn out. Set up your furniture so you have a clear path. Avoid moving your furniture around. If any of your floors are uneven, fix them. If there are any pets around you, be aware of  where they are. Review your medicines with your doctor. Some medicines can make you feel dizzy. This can increase your chance of falling. Ask your doctor what other things that you can do to help prevent falls. This information is not intended to replace advice given to you by your health care provider. Make sure you discuss any questions you have with your health care provider. Document Released: 04/20/2009 Document Revised: 11/30/2015 Document Reviewed: 07/29/2014 Elsevier Interactive Patient Education  2017 [employer].

## 2024-06-11 NOTE — Progress Notes (Signed)
 Annual Wellness Visit   Patient Care Team: , Ronal PARAS, MD as PCP - General (Internal Medicine) Jeffrie Oneil BROCKS, MD as PCP - Cardiology (Cardiology)  Visit Date: 06/11/24   Chief Complaint  Patient presents with   Annual Exam   Medicare Wellness   Subjective:  Patient: [name] [name], Male DOB: 1951-05-29, 73 y.o. MRN: [id number] Vitals:   06/11/24 1457  BP: 120/70   [name] [name] is a 73 y.o. Male who presents today for his Annual Wellness Visit. Patient has Erectile dysfunction; Hypertension; Type 2 diabetes mellitus with complication, without long-term current use of insulin  (HCC); Osteoarthritis of both knees; Obesity; GE reflux; History of gout; Low serum testosterone  level; PVC's (premature ventricular contractions); Hyperlipidemia associated with type 2 diabetes mellitus (HCC); BMI 40.0-44.9, adult (HCC); Abdominal aortic atherosclerosis; Chronic systolic heart failure (HCC); Primary osteoarthritis of left knee; and Primary osteoarthritis of right knee on their problem list.   He has been having some lower back pain. He has been seen at [facility] and was prescribed Celebrex  200 mg as needed for back pain..   Elevated Creatinine of 1.62, Elevated BUN of 28.   History of B-12 deficiency treated with monthly B-12 injections.   History of Diabetes Mellitus, Type II treated with Metformin  500 mg daily. 06/07/2024 HgbA1c 6.3%  History of Hyperlipidemia treated with atorvastatin  10 mg daily. 06/07/2024 Lipid panel normal.   History of Anxiety, insomnia treated with alprazolam  0.25-0.5 mg morning and afternoon and 0.5 mg at bedtime, Zolpidem  5 mg at bedtime as needed.   History of hypertension treated with amlodipine  10 mg daily, carvedilol  25 mg twice daily, furosemide  40 mg daily. Blood pressure today is normal at 120/70.   History of combined systolic and diastolic heart failure, PVCs, abdominal aortic atherosclerosis treated with sacubitril -valsartan  97-103 mg  twice daily, Aldactone . Followed by cardiologist, Dr. Oneil Jeffrie. Ejection fraction on echo in April 2021 was 55 to 60%.  In 2020 ejection fraction was 30 to 35%.   History of GE Reflux, IBS treated with famotidine  40 mg, pantoprazole  40 mg twice daily, dicyclomine  20 mg twice daily. .   Followed by Dr. Esmeralda Sharps.  Was seen in the emergency department May 2023 with chest pain.  MI was ruled out.  Dr. Sharps saw him in March 2023 and felt patient had nonobstructive/minimal atherosclerosis of the coronary arteries, left ventricular dysfunction, obesity.     Hospitalized with acute cholecystitis July 2017.  He had ERCP and laparoscopic cholecystectomy.  Labs 06/07/2024 RBC 3.88, Hemoglobin 12.2, Hemoglobin 37.7, Blood glucose 119, BUN 28, Creatinine 1.62, eGFR 45, HgbA1c 6.3%, Otherwise WNL.    12/13/2019 Colonoscopy One 4 mm polyp in the sigmoid colon. Resected and retrieved. One 2 mm polyp in the distal transverse colon. Resected and retrieved. Pathology found to be adenomas. A single ( solitary) ulcer at the ileocecal valve. Biopsied. Benign IC valve ulcer. Small with surrounding erythema. ? from prep vs meloxicam  The examined portion of the ileum was normal. Diverticulosis in the sigmoid colon. The examination was otherwise normal on direct and retroflexion views. Repeat in 7 years.    Health Maintenance  Topic Date Due   Medicare Annual Wellness (AWV)  05/21/2024   OPHTHALMOLOGY EXAM  05/31/2024   FOOT EXAM  11/26/2024   HEMOGLOBIN A1C  12/06/2024   Diabetic kidney evaluation - eGFR measurement  06/07/2025   Diabetic kidney evaluation - Urine ACR  06/07/2025   Colonoscopy  12/13/2026   DTaP/Tdap/Td (3 - Td or  Tdap) 03/19/2032   Pneumococcal Vaccine: 50+ Years  Completed   Influenza Vaccine  Completed   Hepatitis C Screening  Completed   Zoster Vaccines- Shingrix   Completed   Meningococcal B Vaccine  Aged Out   COVID-19 Vaccine  Discontinued    Review of Systems  Constitutional:   Negative for fever and malaise/fatigue.  HENT:  Negative for congestion.   Eyes:  Negative for blurred vision.  Respiratory:  Negative for cough and shortness of breath.   Cardiovascular:  Negative for chest pain, palpitations and leg swelling.  Gastrointestinal:  Negative for vomiting.  Musculoskeletal:  Negative for back pain.  Skin:  Negative for rash.  Neurological:  Negative for loss of consciousness and headaches.   Objective:  Vitals: body mass index is 39.6 kg/m. Today's Vitals   06/11/24 1457  BP: 120/70  Pulse: 66  SpO2: 98%  Weight: 276 lb (125.2 kg)  Height: 5' 10 (1.778 m)  PainSc: 0-No pain   Physical Exam Vitals and nursing note reviewed. Exam conducted with a chaperone present.  Constitutional:      General: He is awake. He is not in acute distress.    Appearance: Normal appearance. He is not ill-appearing or toxic-appearing.  HENT:     Head: Normocephalic and atraumatic.     Right Ear: Tympanic membrane, ear canal and external ear normal.     Left Ear: Tympanic membrane, ear canal and external ear normal.     Ears:     Comments: Cerumen in right ear.     Mouth/Throat:     Pharynx: Oropharynx is clear.  Eyes:     Extraocular Movements: Extraocular movements intact.     Pupils: Pupils are equal, round, and reactive to light.  Neck:     Thyroid : No thyroid  mass, thyromegaly or thyroid  tenderness.     Vascular: No carotid bruit.  Cardiovascular:     Rate and Rhythm: Normal rate and regular rhythm. No extrasystoles are present.    Pulses:          Dorsalis pedis pulses are 2+ on the right side and 2+ on the left side.       Posterior tibial pulses are 2+ on the right side and 2+ on the left side.     Heart sounds: Normal heart sounds. No murmur heard.    No friction rub. No gallop.  Pulmonary:     Effort: Pulmonary effort is normal.     Breath sounds: Normal breath sounds. No decreased breath sounds, wheezing, rhonchi or rales.  Chest:     Chest wall:  No mass.  Abdominal:     Palpations: Abdomen is soft. There is no hepatomegaly, splenomegaly or mass.     Tenderness: There is no abdominal tenderness.     Hernia: No hernia is present.  Genitourinary:    Prostate: Normal. Not enlarged, not tender and no nodules present.     Rectum: Normal. Guaiac result negative.  Musculoskeletal:     Cervical back: Normal range of motion.     Right lower leg: No edema.     Left lower leg: No edema.  Lymphadenopathy:     Cervical: No cervical adenopathy.     Upper Body:     Right upper body: No supraclavicular adenopathy.     Left upper body: No supraclavicular adenopathy.  Skin:    General: Skin is warm and dry.  Neurological:     General: No focal deficit present.     Mental  Status: He is alert and oriented to person, place, and time. Mental status is at baseline.     Cranial Nerves: Cranial nerves 2-12 are intact.     Sensory: Sensation is intact.     Motor: Motor function is intact.     Coordination: Coordination is intact.     Gait: Gait is intact.     Deep Tendon Reflexes: Reflexes are normal and symmetric.  Psychiatric:        Attention and Perception: Attention normal.        Mood and Affect: Mood normal.        Speech: Speech normal.        Behavior: Behavior normal. Behavior is cooperative.        Thought Content: Thought content normal.        Cognition and Memory: Cognition and memory normal.        Judgment: Judgment normal.     Current Outpatient Medications  Medication Instructions   ALPRAZolam  (XANAX ) 0.5 MG tablet Take 1/2-1 tablet (0.25-0.5 mg total) in the morning, 1/2-1 tablet (0.25-0.5 mg total) in the early afternoon and 1 tablet (0.5 mg total) at bedtime  for anxiety   amLODipine  (NORVASC ) 10 mg, Oral, Daily   aspirin  EC 81 mg, Oral, 2 times daily after meals   atorvastatin  (LIPITOR) 10 mg, Oral, Daily   carvedilol  (COREG ) 25 mg, Oral, 2 times daily   celecoxib  (CELEBREX ) 200 MG capsule Take 1 capsule (200 mg  total) by mouth daily with food as needed for pain.   dicyclomine  (BENTYL ) 20 mg, Oral, 2 times daily   famotidine  (PEPCID ) 40 mg, Oral, Daily at bedtime   furosemide  (LASIX ) 40 mg, Oral, Daily   gabapentin  (NEURONTIN ) 300 mg, Oral, As directed   HYDROcodone -acetaminophen  (NORCO) 10-325 MG tablet 1 tablet, Oral, Every 8 hours PRN   meloxicam  (MOBIC ) 15 mg, Oral, Daily   metFORMIN  (GLUCOPHAGE -XR) 500 mg, Oral, 2 times daily   pantoprazole  (PROTONIX ) 40 mg, Oral, 2 times daily before meals   pregabalin  (LYRICA ) 50 mg, Oral, 2 times daily   sacubitril -valsartan  (ENTRESTO ) 97-103 MG 1 tablet, Oral, 2 times daily   spironolactone  (ALDACTONE ) 25 mg, Oral, 2 times daily   zolpidem  (AMBIEN ) 5 MG tablet Take 1 tablet (5 mg total) by mouth at bedtime as needed for sleep.   Past Medical History:  Diagnosis Date   Anxiety    Arthritis    Bradycardia    hx of  in 30's medications changed   Diabetes mellitus without complication (HCC)    DJD (degenerative joint disease) of knee    Left   Hx of adenomatous colonic polyps 12/2019   2 diminutive   Hypertension    Medical/Surgical History Narrative:  Allergic/Intolerant to: No Known Allergies  Past Surgical History:  Procedure Laterality Date   CARDIAC CATHETERIZATION  07/23/2018   CARPAL TUNNEL RELEASE Bilateral 2023   CHOLECYSTECTOMY N/A 02/04/2016   Procedure: LAPAROSCOPIC CHOLECYSTECTOMY;  Surgeon: Bernarda Ned, MD;  Location: [facility];  Service: General;  Laterality: N/A;   COLONOSCOPY  12/13/2019   ERCP N/A 02/02/2016   Procedure: ENDOSCOPIC RETROGRADE CHOLANGIOPANCREATOGRAPHY (ERCP);  Surgeon: Lupita FORBES Commander, MD;  Location: [facility];  Service: Endoscopy;  Laterality: N/A;   HERNIA REPAIR     RIGHT/LEFT HEART CATH AND CORONARY ANGIOGRAPHY N/A 07/23/2018   Procedure: RIGHT/LEFT HEART CATH AND CORONARY ANGIOGRAPHY;  Surgeon: Claudene Victory ORN, MD;  Location: [facility];  Service: Cardiovascular;  Laterality: N/A;   SPINE SURGERY  TOTAL KNEE ARTHROPLASTY Left 07/28/2018   Procedure: TOTAL KNEE ARTHROPLASTY;  Surgeon: Sheril Coy, MD;  Location: [facility];  Service: Orthopedics;  Laterality: Left;   TOTAL KNEE ARTHROPLASTY Right 09/05/2020   Procedure: RIGHT TOTAL KNEE ARTHROPLASTY;  Surgeon: Sheril Coy, MD;  Location: [facility];  Service: Orthopedics;  Laterality: Right;   Family History  Problem Relation Age of Onset   Stroke Mother    Hypertension Mother    Colon cancer Neg Hx    Stomach cancer Neg Hx    Rectal cancer Neg Hx    Pancreatic cancer Neg Hx    Esophageal cancer Neg Hx    Social History   Social History Narrative   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   He is married   No alcohol tobacco or drug use      Most Recent Health Risks Assessment:   Most Recent Social Determinants of Health (Including Hx of Tobacco, Alcohol, and Drug Use) SDOH Screenings   Food Insecurity: No Food Insecurity (06/11/2024)  Housing: Low Risk  (06/11/2024)  Transportation Needs: No Transportation Needs (06/11/2024)  Utilities: Not At Risk (06/11/2024)  Alcohol Screen: Low Risk  (06/11/2024)  Depression (PHQ2-9): Low Risk  (06/11/2024)  Financial Resource Strain: Low Risk  (06/11/2024)  Physical Activity: Insufficiently Active (06/11/2024)  Social Connections: Socially Integrated (06/11/2024)  Stress: No Stress Concern Present (06/11/2024)  Tobacco Use: Low Risk  (06/11/2024)  Health Literacy: Adequate Health Literacy (06/11/2024)   Social History   Tobacco Use   Smoking status: Never   Smokeless tobacco: Never  Vaping Use   Vaping status: Never Used  Substance Use Topics   Alcohol use: No   Drug use: No     Most Recent Fall Risk Assessment:    06/11/2024    2:45 PM  Fall Risk   Falls in the past year? 0  Number falls in past yr: 0  Injury with Fall? 0  Risk for fall due to : No Fall Risks  Follow up Falls prevention discussed;Education provided;Falls evaluation completed   Most Recent  Anxiety/Depression Screenings:    06/11/2024    2:54 PM 05/22/2023   10:02 AM  PHQ 2/9 Scores  PHQ - 2 Score 0 0    Most Recent Cognitive Screening:    06/11/2024    2:45 PM  6CIT Screen  What Year? 0 points  What month? 0 points  What time? 0 points  Count back from 20 0 points  Months in reverse 0 points  Repeat phrase 0 points  Total Score 0 points    Results:  Studies Obtained And Personally Reviewed By Me:  12/13/2019 Colonoscopy One 4 mm polyp in the sigmoid colon. Resected and retrieved. One 2 mm polyp in the distal transverse colon. Resected and retrieved. Pathology found to be adenomas. A single ( solitary) ulcer at the ileocecal valve. Biopsied. Benign IC valve ulcer. Small with surrounding erythema. ? from prep vs meloxicam  The examined portion of the ileum was normal. Diverticulosis in the sigmoid colon. The examination was otherwise normal on direct and retroflexion views. Repeat in 7 years.    Labs:  CBC w/ Differential Lab Results  Component Value Date   WBC 3.9 06/07/2024   RBC 3.88 (L) 06/07/2024   HGB 12.2 (L) 06/07/2024   HCT 37.7 (L) 06/07/2024   PLT 242 06/07/2024   MCV 97.2 06/07/2024   MCH 31.4 06/07/2024   MCHC 32.4 06/07/2024   RDW 12.3 06/07/2024   MPV 10.2  06/07/2024   LYMPHSABS 1,721 05/13/2022   MONOABS 0.8 09/06/2020   BASOSABS 39 06/07/2024    Comprehensive Metabolic Panel Lab Results  Component Value Date   NA 137 06/07/2024   K 4.8 06/07/2024   CL 103 06/07/2024   CO2 26 06/07/2024   GLUCOSE 119 (H) 06/07/2024   BUN 28 (H) 06/07/2024   CREATININE 1.62 (H) 06/07/2024   CALCIUM  9.3 06/07/2024   PROT 7.3 06/07/2024   ALBUMIN 3.2 (L) 09/06/2020   AST 11 06/07/2024   ALT 10 06/07/2024   ALKPHOS 48 09/06/2020   BILITOT 1.0 06/07/2024   EGFR 45 (L) 06/07/2024   GFRNONAA >60 12/03/2021   Lipid Panel  Lab Results  Component Value Date   CHOL 133 06/07/2024   HDL 51 06/07/2024   LDLCALC 66 06/07/2024   TRIG 79 06/07/2024    A1c Lab Results  Component Value Date   HGBA1C 6.3 (H) 06/07/2024    TSH Lab Results  Component Value Date   TSH 0.564 09/06/2020   PSA 0.89 Assessment & Plan:   Orders Placed This Encounter  Procedures   BUN/Creatinine Ratio   POCT URINALYSIS DIP (CLINITEK)   Meds ordered this encounter  Medications   HYDROcodone -acetaminophen  (NORCO) 10-325 MG tablet    Sig: Take 1 tablet by mouth every 8 (eight) hours as needed for up to 5 days.    Dispense:  15 tablet    Refill:  0   Back pain: He has been having some lower back pain. He has been seen at [facility] and was prescribed Celebrex  200 mg as needed for back pain.    Norco 10-325 mg every as needed prescribed.   Elevated Creatinine: of 1.62, Elevated BUN of 28.    BUN/ Creatinine ratio ordered.   Diabetes Mellitus, Type II: treated with Metformin  500 mg daily.  06/07/2024 HgbA1c 6.3%   B-12 deficiency: treated with monthly B-12 injections.  B-12 injection received today.    Hyperlipidemia: treated with atorvastatin  10 mg daily. 06/07/2024 Lipid panel normal.   Anxiety, insomnia: treated with alprazolam  0.25-0.5 mg morning and afternoon and 0.5 mg at bedtime, Zolpidem  5 mg at bedtime as needed.   Hypertension: treated with amlodipine  10 mg daily, carvedilol  25 mg twice daily, furosemide  40 mg daily. Blood pressure today is normal at 120/70.   Combined systolic and diastolic heart failure, PVCs, abdominal aortic atherosclerosis: treated with sacubitril -valsartan  97-103 mg twice daily, Aldactone . Followed by cardiologist, Dr. Oneil Parchment. Ejection fraction on echo in April 2021 was 55 to 60%.  In 2020 ejection fraction was 30 to 35%.   GE Reflux, IBS: treated with famotidine  40 mg, pantoprazole  40 mg twice daily, dicyclomine  20 mg twice daily .  12/13/2019 Colonoscopy One 4 mm polyp in the sigmoid colon. Resected and retrieved. One 2 mm polyp in the distal transverse colon. Resected and retrieved. Pathology found to be  adenomas. A single ( solitary) ulcer at the ileocecal valve. Biopsied. Benign IC valve ulcer. Small with surrounding erythema. ? from prep vs meloxicam  The examined portion of the ileum was normal. Diverticulosis in the sigmoid colon. The examination was otherwise normal on direct and retroflexion views. Repeat in 7 years.     Annual Wellness Visit done today including the all of the following: Reviewed patient's Family Medical History Reviewed patient's SDOH and reviewed tobacco, alcohol, and drug use.  Reviewed and updated list of patient's medical providers Assessment of cognitive impairment was done Assessed patient's functional ability Established a written schedule  for health screening services Health Risk Assessent Completed and Reviewed  Discussed health benefits of physical activity, and encouraged him to engage in regular exercise appropriate for his age and condition.   I,Makayla C Reid,acting as a scribe for Ronal JINNY Hailstone, MD.,have documented all relevant documentation on the behalf of Ronal JINNY Hailstone, MD,as directed by  Ronal JINNY Hailstone, MD while in the presence of Ronal JINNY Hailstone, MD.  I, Ronal JINNY Hailstone, MD, have reviewed all documentation for and agree with the above Annual Wellness Visit documentation.  Ronal JINNY Hailstone, MD Internal Medicine 06/11/2024

## 2024-06-12 ENCOUNTER — Ambulatory Visit: Payer: Self-pay | Admitting: Internal Medicine

## 2024-06-12 ENCOUNTER — Other Ambulatory Visit (HOSPITAL_COMMUNITY): Payer: Self-pay

## 2024-06-14 ENCOUNTER — Ambulatory Visit

## 2024-06-14 NOTE — Telephone Encounter (Signed)
 Hydrocodone  APAP refilled.Hold Meloxicam . Follow up for repeat creatinine in a few days. Come well hydrated. Creatinine on December 1st was 1.62 abd was 1.12 when checked  in Nov. 2024. MJB, MD

## 2024-06-16 ENCOUNTER — Other Ambulatory Visit: Payer: Self-pay

## 2024-06-16 ENCOUNTER — Other Ambulatory Visit: Payer: Self-pay | Admitting: Internal Medicine

## 2024-06-17 ENCOUNTER — Encounter: Payer: Self-pay | Admitting: Internal Medicine

## 2024-06-17 ENCOUNTER — Ambulatory Visit: Admitting: Internal Medicine

## 2024-06-17 VITALS — BP 110/80 | HR 62 | Ht 70.0 in | Wt 277.0 lb

## 2024-06-17 DIAGNOSIS — R7989 Other specified abnormal findings of blood chemistry: Secondary | ICD-10-CM

## 2024-06-17 DIAGNOSIS — I10 Essential (primary) hypertension: Secondary | ICD-10-CM

## 2024-06-17 DIAGNOSIS — Z7984 Long term (current) use of oral hypoglycemic drugs: Secondary | ICD-10-CM | POA: Diagnosis not present

## 2024-06-17 DIAGNOSIS — M7918 Myalgia, other site: Secondary | ICD-10-CM

## 2024-06-17 DIAGNOSIS — E119 Type 2 diabetes mellitus without complications: Secondary | ICD-10-CM

## 2024-06-17 NOTE — Progress Notes (Signed)
 "   Patient Care Team: Perri Ronal PARAS, MD as PCP - General (Internal Medicine) Jeffrie Oneil BROCKS, MD as PCP - Cardiology (Cardiology)  Visit Date: 06/17/2024  Subjective:    Patient ID: [name] [name] , Male   DOB: 1951-02-25, 73 y.o.    MRN: [id number]   73 y.o. Male presents today for  Elevated creatine. Patient has a past medical history of B-12 deficiency, Hyperlipidemia, Hypertension.  On 06/07/2024  his BUN was 28, Creatinine was 1.62, and eGFR was 45. On recheck his 06/11/2024 BUN was  39, creatinine was 1.93, and eGFR was 37. Compared to November of last year in which  BUN was 14, Creatinine was 1.12 and eGFR was 71. He said that today he is well hydrated. He was advised to drink three glasses of water  prior to his visit. He was on Meloxicam  for his musculoskeletal pain, this has since been discontinued.   History of Diabetes Mellitus, Type II treated with Metformin  500 mg daily. 06/07/2024 HgbA1c 6.3% increased from 6.2% 6 months ago.    History of Musculoskeletal pain treated with Norco 10-325 mg. Advised to take  half a tablet every 8 hours.    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       Objective:   Vitals: BP 110/80   Pulse 62   Ht 5' 10 (1.778 m)   Wt 277 lb (125.6  kg)   SpO2 97%   BMI 39.75 kg/m    Physical Exam Vitals and nursing note reviewed.       Results:    Labs:       Component Value Date/Time   NA 137 06/07/2024 0924   NA 139 01/16/2021 0822   K 4.8 06/07/2024 0924   CL 103 06/07/2024 0924   CO2 26 06/07/2024 0924   GLUCOSE 119 (H) 06/07/2024 0924   BUN 39 (H) 06/11/2024 1525   BUN 12 01/16/2021 0822   CREATININE 1.93 (H) 06/11/2024 1525   CALCIUM  9.3 06/07/2024 0924   PROT 7.3 06/07/2024 0924   ALBUMIN 3.2 (L) 09/06/2020 1038   AST 11 06/07/2024 0924   ALT 10 06/07/2024 0924   ALKPHOS 48 09/06/2020 1038   BILITOT 1.0 06/07/2024 0924   GFRNONAA >60 12/03/2021 2008   GFRNONAA 73 04/21/2020 0904   GFRAA 85 04/21/2020 0904     Lab Results  Component Value Date   WBC 3.9 06/07/2024   HGB 12.2 (L) 06/07/2024   HCT 37.7 (L) 06/07/2024   MCV 97.2 06/07/2024   PLT 242  06/07/2024    Lab Results  Component Value Date   CHOL 133 06/07/2024   HDL 51 06/07/2024   LDLCALC 66 06/07/2024   TRIG 79 06/07/2024   CHOLHDL 2.6 06/07/2024    Lab Results  Component Value Date   HGBA1C 6.3 (H) 06/07/2024     Lab Results  Component Value Date   TSH 0.564 09/06/2020     Lab Results  Component Value Date   PSA 0.89 06/07/2024   PSA 0.58 05/19/2023   PSA 0.48 05/13/2022   Assessment & Plan:   Orders Placed This Encounter  Procedures   Basic Metabolic Panel    Elevated serum creatinine: On 06/07/2024  his BUN was 28, Creatinine was 1.62, and eGFR was 45. On recheck his 06/11/2024 BUN was  39, creatinine was 1.93, and eGFR was 37. Compared to November of last year in which  BUN was 14, Creatinine was 1.12 and eGFR was 71. He said that today he is well hydrated. He was advised to drink three glasses of water  prior to his visit. He was on Meloxicam  for his musculoskeletal pain, this has since been discontinued.    BMP ordered - creatinine has improved with hydration to 1.61. Still has increased from 1.12 in November  2024.  Diabetes Mellitus, Type II treated with Metformin  500 mg daily. 06/07/2024 HgbA1c 6.3% increased from 6.2% 6 months ago.    Musculoskeletal pain: treated with Norco 10-325 mg. Advised to take half a tablet every 8 hours.     I,Makayla C Reid,acting as a scribe for Ronal JINNY Hailstone, MD.,have documented all relevant documentation on the behalf of Ronal JINNY Hailstone, MD,as directed by  Ronal JINNY Hailstone, MD while in the presence of Ronal JINNY Hailstone, MD.  I, Ronal JINNY Hailstone, MD, have reviewed all documentation for this visit. The documentation on 06/17/2024 for the exam, diagnosis, procedures, and orders are all accurate and complete.    "

## 2024-06-18 ENCOUNTER — Ambulatory Visit: Payer: Self-pay | Admitting: Internal Medicine

## 2024-06-18 LAB — BASIC METABOLIC PANEL WITH GFR
BUN/Creatinine Ratio: 14 (calc) (ref 6–22)
BUN: 23 mg/dL (ref 7–25)
CO2: 28 mmol/L (ref 20–32)
Calcium: 9.2 mg/dL (ref 8.6–10.3)
Chloride: 98 mmol/L (ref 98–110)
Creat: 1.61 mg/dL — ABNORMAL HIGH (ref 0.70–1.28)
Glucose, Bld: 117 mg/dL — ABNORMAL HIGH (ref 65–99)
Potassium: 4.6 mmol/L (ref 3.5–5.3)
Sodium: 134 mmol/L — ABNORMAL LOW (ref 135–146)
eGFR: 45 mL/min/1.73m2 — ABNORMAL LOW (ref >=60)

## 2024-07-04 ENCOUNTER — Encounter: Payer: Self-pay | Admitting: Internal Medicine

## 2024-07-04 NOTE — Patient Instructions (Addendum)
 Has discontinued Meloxicam . Return in June for follow up of mild elevation of creatinine. Stay well hydrated. Do not take Aleve or Advil products. Can take Norco sparingly if needed.

## 2024-07-05 ENCOUNTER — Other Ambulatory Visit (HOSPITAL_COMMUNITY): Payer: Self-pay

## 2024-07-05 ENCOUNTER — Telehealth: Payer: Self-pay | Admitting: Internal Medicine

## 2024-07-05 ENCOUNTER — Other Ambulatory Visit: Payer: Self-pay

## 2024-07-05 DIAGNOSIS — G8929 Other chronic pain: Secondary | ICD-10-CM

## 2024-07-05 MED ORDER — HYDROCODONE-ACETAMINOPHEN 10-325 MG PO TABS
ORAL_TABLET | ORAL | 0 refills | Status: AC
  Filled 2024-07-05: qty 15, 5d supply, fill #0

## 2024-07-05 NOTE — Telephone Encounter (Signed)
 Refill Norco 10/325 (#15) tabs one tab every 8 hours as needed for back pain, Take with food to [facility] [employer]. MJB, MD

## 2024-07-05 NOTE — Telephone Encounter (Signed)
 Done

## 2024-07-12 ENCOUNTER — Other Ambulatory Visit: Payer: Self-pay | Admitting: Internal Medicine

## 2024-07-12 ENCOUNTER — Other Ambulatory Visit: Payer: Self-pay

## 2024-07-12 MED ORDER — FAMOTIDINE 40 MG PO TABS
40.0000 mg | ORAL_TABLET | Freq: Every day | ORAL | 2 refills | Status: AC
  Filled 2024-07-12: qty 90, 90d supply, fill #0

## 2024-07-19 ENCOUNTER — Ambulatory Visit

## 2024-07-19 VITALS — BP 110/70 | HR 66 | Ht 70.0 in | Wt 277.0 lb

## 2024-07-19 DIAGNOSIS — E538 Deficiency of other specified B group vitamins: Secondary | ICD-10-CM | POA: Diagnosis not present

## 2024-07-19 MED ORDER — CYANOCOBALAMIN 1000 MCG/ML IJ SOLN
1000.0000 ug | Freq: Once | INTRAMUSCULAR | Status: AC
  Administered 2024-07-19: 1000 ug via INTRAMUSCULAR

## 2024-07-19 NOTE — Progress Notes (Signed)
 Patient received 1 cc IM Vitamin B12 injection today RUQ. Patient tolerated well.

## 2024-07-26 ENCOUNTER — Other Ambulatory Visit: Payer: Self-pay | Admitting: Internal Medicine

## 2024-07-26 ENCOUNTER — Other Ambulatory Visit: Payer: Self-pay

## 2024-07-26 ENCOUNTER — Other Ambulatory Visit (HOSPITAL_COMMUNITY): Payer: Self-pay

## 2024-07-26 MED ORDER — ALPRAZOLAM 0.5 MG PO TABS
ORAL_TABLET | ORAL | 3 refills | Status: AC
  Filled 2024-07-26: qty 90, 30d supply, fill #0

## 2024-07-26 MED ORDER — PREGABALIN 50 MG PO CAPS
50.0000 mg | ORAL_CAPSULE | Freq: Two times a day (BID) | ORAL | 2 refills | Status: AC
  Filled 2024-08-11: qty 60, 30d supply, fill #0

## 2024-07-27 ENCOUNTER — Other Ambulatory Visit: Payer: Self-pay

## 2024-08-11 ENCOUNTER — Other Ambulatory Visit: Payer: Self-pay

## 2024-08-19 ENCOUNTER — Ambulatory Visit

## 2024-12-13 ENCOUNTER — Other Ambulatory Visit

## 2024-12-16 ENCOUNTER — Ambulatory Visit: Admitting: Internal Medicine
